# Patient Record
Sex: Female | Born: 1985 | Race: White | Hispanic: No | Marital: Married | Smoking: Never smoker
Health system: Southern US, Community
[De-identification: ages and names within clinical notes are randomized; demographics above are authoritative.]

## PROBLEM LIST (undated history)

## (undated) ENCOUNTER — Inpatient Hospital Stay (HOSPITAL_COMMUNITY): Payer: Self-pay

## (undated) DIAGNOSIS — M26629 Arthralgia of temporomandibular joint, unspecified side: Secondary | ICD-10-CM

## (undated) DIAGNOSIS — G4489 Other headache syndrome: Secondary | ICD-10-CM

## (undated) DIAGNOSIS — N920 Excessive and frequent menstruation with regular cycle: Secondary | ICD-10-CM

## (undated) DIAGNOSIS — E538 Deficiency of other specified B group vitamins: Secondary | ICD-10-CM

## (undated) DIAGNOSIS — M549 Dorsalgia, unspecified: Secondary | ICD-10-CM

## (undated) DIAGNOSIS — R5383 Other fatigue: Secondary | ICD-10-CM

## (undated) DIAGNOSIS — R002 Palpitations: Secondary | ICD-10-CM

## (undated) DIAGNOSIS — Z87442 Personal history of urinary calculi: Secondary | ICD-10-CM

## (undated) DIAGNOSIS — D497 Neoplasm of unspecified behavior of endocrine glands and other parts of nervous system: Secondary | ICD-10-CM

## (undated) DIAGNOSIS — R0602 Shortness of breath: Secondary | ICD-10-CM

## (undated) DIAGNOSIS — F53 Postpartum depression: Secondary | ICD-10-CM

## (undated) DIAGNOSIS — F411 Generalized anxiety disorder: Secondary | ICD-10-CM

## (undated) DIAGNOSIS — K8020 Calculus of gallbladder without cholecystitis without obstruction: Secondary | ICD-10-CM

## (undated) DIAGNOSIS — E049 Nontoxic goiter, unspecified: Secondary | ICD-10-CM

## (undated) DIAGNOSIS — G4440 Drug-induced headache, not elsewhere classified, not intractable: Secondary | ICD-10-CM

## (undated) DIAGNOSIS — R072 Precordial pain: Secondary | ICD-10-CM

## (undated) DIAGNOSIS — R079 Chest pain, unspecified: Secondary | ICD-10-CM

## (undated) DIAGNOSIS — F419 Anxiety disorder, unspecified: Secondary | ICD-10-CM

## (undated) DIAGNOSIS — F329 Major depressive disorder, single episode, unspecified: Secondary | ICD-10-CM

## (undated) DIAGNOSIS — I493 Ventricular premature depolarization: Secondary | ICD-10-CM

## (undated) DIAGNOSIS — N938 Other specified abnormal uterine and vaginal bleeding: Secondary | ICD-10-CM

## (undated) DIAGNOSIS — R7303 Prediabetes: Secondary | ICD-10-CM

## (undated) DIAGNOSIS — T7840XA Allergy, unspecified, initial encounter: Secondary | ICD-10-CM

## (undated) DIAGNOSIS — N200 Calculus of kidney: Secondary | ICD-10-CM

## (undated) DIAGNOSIS — F32A Depression, unspecified: Secondary | ICD-10-CM

## (undated) DIAGNOSIS — E559 Vitamin D deficiency, unspecified: Secondary | ICD-10-CM

## (undated) DIAGNOSIS — K219 Gastro-esophageal reflux disease without esophagitis: Secondary | ICD-10-CM

## (undated) DIAGNOSIS — O021 Missed abortion: Secondary | ICD-10-CM

## (undated) DIAGNOSIS — M7751 Other enthesopathy of right foot: Secondary | ICD-10-CM

## (undated) HISTORY — DX: Chest pain, unspecified: R07.9

## (undated) HISTORY — PX: WISDOM TOOTH EXTRACTION: SHX21

## (undated) HISTORY — DX: Allergy, unspecified, initial encounter: T78.40XA

## (undated) HISTORY — DX: Calculus of gallbladder without cholecystitis without obstruction: K80.20

## (undated) HISTORY — PX: KIDNEY STONE SURGERY: SHX686

## (undated) HISTORY — PX: MOUTH SURGERY: SHX715

## (undated) HISTORY — DX: Anxiety disorder, unspecified: F41.9

## (undated) HISTORY — DX: Major depressive disorder, single episode, unspecified: F32.9

## (undated) HISTORY — DX: Ventricular premature depolarization: I49.3

## (undated) HISTORY — PX: TYMPANOSTOMY TUBE PLACEMENT: SHX32

## (undated) HISTORY — DX: Other fatigue: R53.83

## (undated) HISTORY — DX: Shortness of breath: R06.02

## (undated) HISTORY — DX: Depression, unspecified: F32.A

## (undated) HISTORY — DX: Personal history of urinary calculi: Z87.442

## (undated) HISTORY — DX: Prediabetes: R73.03

## (undated) HISTORY — DX: Dorsalgia, unspecified: M54.9

---
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
## 2006-03-15 ENCOUNTER — Ambulatory Visit: Payer: Self-pay | Admitting: Family Medicine

## 2006-10-11 ENCOUNTER — Ambulatory Visit: Payer: Self-pay | Admitting: Family Medicine

## 2006-10-21 ENCOUNTER — Ambulatory Visit: Payer: Self-pay | Admitting: Family Medicine

## 2006-10-22 ENCOUNTER — Encounter: Payer: Self-pay | Admitting: Family Medicine

## 2006-10-25 ENCOUNTER — Ambulatory Visit: Payer: Self-pay | Admitting: Family Medicine

## 2007-03-17 ENCOUNTER — Ambulatory Visit: Payer: Self-pay | Admitting: Family Medicine

## 2007-03-17 LAB — CONVERTED CEMR LAB
Albumin: 4.5 g/dL (ref 3.5–5.2)
Alkaline Phosphatase: 50 units/L (ref 39–117)
BUN: 9 mg/dL (ref 6–23)
Basophils Absolute: 0 10*3/uL (ref 0.0–0.1)
Creatinine, Ser: 1 mg/dL (ref 0.4–1.2)
Eosinophils Absolute: 0.1 10*3/uL (ref 0.0–0.6)
GFR calc Af Amer: 91 mL/min
GFR calc non Af Amer: 75 mL/min
Hemoglobin: 13.3 g/dL (ref 12.0–15.0)
MCHC: 34.2 g/dL (ref 30.0–36.0)
MCV: 91.8 fL (ref 78.0–100.0)
Monocytes Absolute: 0.4 10*3/uL (ref 0.2–0.7)
Monocytes Relative: 7.1 % (ref 3.0–11.0)
Neutro Abs: 3.6 10*3/uL (ref 1.4–7.7)
Neutrophils Relative %: 64.1 % (ref 43.0–77.0)
Potassium: 4 meq/L (ref 3.5–5.1)
RDW: 11.8 % (ref 11.5–14.6)
Sodium: 140 meq/L (ref 135–145)
TSH: 2.13 microintl units/mL (ref 0.35–5.50)

## 2007-03-30 ENCOUNTER — Inpatient Hospital Stay (HOSPITAL_COMMUNITY): Admission: EM | Admit: 2007-03-30 | Discharge: 2007-04-02 | Payer: Self-pay | Admitting: Urology

## 2007-04-14 ENCOUNTER — Ambulatory Visit: Payer: Self-pay | Admitting: Family Medicine

## 2007-04-14 DIAGNOSIS — F411 Generalized anxiety disorder: Secondary | ICD-10-CM | POA: Insufficient documentation

## 2007-05-17 ENCOUNTER — Ambulatory Visit: Payer: Self-pay | Admitting: Family Medicine

## 2007-07-15 ENCOUNTER — Ambulatory Visit: Payer: Self-pay | Admitting: Internal Medicine

## 2007-07-15 LAB — CONVERTED CEMR LAB: Rapid Strep: NEGATIVE

## 2007-09-12 ENCOUNTER — Encounter: Payer: Self-pay | Admitting: Family Medicine

## 2007-12-13 ENCOUNTER — Telehealth (INDEPENDENT_AMBULATORY_CARE_PROVIDER_SITE_OTHER): Payer: Self-pay | Admitting: *Deleted

## 2007-12-16 ENCOUNTER — Ambulatory Visit: Payer: Self-pay | Admitting: Family Medicine

## 2007-12-16 DIAGNOSIS — Z87442 Personal history of urinary calculi: Secondary | ICD-10-CM | POA: Insufficient documentation

## 2007-12-16 HISTORY — DX: Personal history of urinary calculi: Z87.442

## 2007-12-16 LAB — CONVERTED CEMR LAB
ALT: 13 units/L (ref 0–35)
AST: 18 units/L (ref 0–37)
Basophils Absolute: 0 10*3/uL (ref 0.0–0.1)
Basophils Relative: 0 % (ref 0–1)
Bilirubin, Direct: 0.1 mg/dL (ref 0.0–0.3)
Blood in Urine, dipstick: NEGATIVE
CO2: 23 meq/L (ref 19–32)
Chloride: 105 meq/L (ref 96–112)
Glucose, Urine, Semiquant: NEGATIVE
Hemoglobin: 14.2 g/dL (ref 12.0–15.0)
Indirect Bilirubin: 0.4 mg/dL (ref 0.0–0.9)
Lymphocytes Relative: 28 % (ref 12–46)
MCHC: 33.9 g/dL (ref 30.0–36.0)
Monocytes Absolute: 0.4 10*3/uL (ref 0.1–1.0)
Monocytes Relative: 8 % (ref 3–12)
Neutro Abs: 3.7 10*3/uL (ref 1.7–7.7)
Neutrophils Relative %: 64 % (ref 43–77)
Nitrite: NEGATIVE
Potassium: 4 meq/L (ref 3.5–5.3)
RBC: 4.65 M/uL (ref 3.87–5.11)
RDW: 12.5 % (ref 11.5–15.5)
Specific Gravity, Urine: >=1.03
Total Protein: 7.7 g/dL (ref 6.0–8.3)
pH: 7

## 2007-12-22 ENCOUNTER — Ambulatory Visit: Payer: Self-pay | Admitting: Family Medicine

## 2008-03-01 ENCOUNTER — Emergency Department (HOSPITAL_COMMUNITY): Admission: EM | Admit: 2008-03-01 | Discharge: 2008-03-01 | Payer: Self-pay | Admitting: Emergency Medicine

## 2008-04-19 ENCOUNTER — Telehealth: Payer: Self-pay | Admitting: Family Medicine

## 2008-09-06 ENCOUNTER — Ambulatory Visit: Payer: Self-pay | Admitting: Family Medicine

## 2008-09-06 DIAGNOSIS — F329 Major depressive disorder, single episode, unspecified: Secondary | ICD-10-CM

## 2008-09-06 DIAGNOSIS — F32A Depression, unspecified: Secondary | ICD-10-CM | POA: Insufficient documentation

## 2008-12-03 ENCOUNTER — Telehealth: Payer: Self-pay | Admitting: Family Medicine

## 2008-12-10 ENCOUNTER — Telehealth: Payer: Self-pay | Admitting: Family Medicine

## 2009-01-10 ENCOUNTER — Inpatient Hospital Stay (HOSPITAL_COMMUNITY): Admission: EM | Admit: 2009-01-10 | Discharge: 2009-01-13 | Payer: Self-pay | Admitting: Emergency Medicine

## 2009-01-10 ENCOUNTER — Ambulatory Visit: Payer: Self-pay | Admitting: Internal Medicine

## 2009-01-24 ENCOUNTER — Ambulatory Visit: Payer: Self-pay | Admitting: Family Medicine

## 2009-02-06 ENCOUNTER — Ambulatory Visit: Payer: Self-pay | Admitting: Family Medicine

## 2010-01-09 ENCOUNTER — Ambulatory Visit: Payer: Self-pay | Admitting: Family Medicine

## 2010-03-31 ENCOUNTER — Ambulatory Visit: Payer: Self-pay | Admitting: Family Medicine

## 2010-04-02 ENCOUNTER — Emergency Department (HOSPITAL_COMMUNITY): Admission: EM | Admit: 2010-04-02 | Discharge: 2010-04-03 | Payer: Self-pay | Admitting: Emergency Medicine

## 2010-04-03 ENCOUNTER — Emergency Department (HOSPITAL_COMMUNITY): Admission: EM | Admit: 2010-04-03 | Discharge: 2010-04-04 | Payer: Self-pay | Admitting: Emergency Medicine

## 2010-04-10 ENCOUNTER — Ambulatory Visit: Payer: Self-pay | Admitting: Family Medicine

## 2010-06-12 ENCOUNTER — Emergency Department (HOSPITAL_COMMUNITY): Admission: EM | Admit: 2010-06-12 | Discharge: 2010-06-12 | Payer: Self-pay | Admitting: Emergency Medicine

## 2010-09-11 ENCOUNTER — Ambulatory Visit
Admission: RE | Admit: 2010-09-11 | Discharge: 2010-09-11 | Payer: Self-pay | Source: Home / Self Care | Attending: Family Medicine | Admitting: Family Medicine

## 2010-09-30 NOTE — Assessment & Plan Note (Signed)
Summary: TB,VAR TITER//ALP  Nurse Visit   Allergies: 1)  ! Penicillin  Immunizations Administered:  PPD Skin Test:    Vaccine Type: PPD    Site: left forearm    Mfr: Sanofi Pasteur    Dose: 0.1 ml    Route: ID    Given by: Kern Reap CMA (AAMA)    Exp. Date: 06/13/2011    Lot #: [id number]    Physician counseled: yes  PPD Results    Date of reading: 01/11/2010    Results: < 5mm    Interpretation: negative  Orders Added: 1)  Venipuncture [36415] 2)  T- * Misc. Laboratory test [99999] 3)  TB Skin Test [86580] 4)  Admin 1st Vaccine [phone]

## 2010-09-30 NOTE — Letter (Signed)
Summary: Out of Work  Adult nurse at [employer]  [address]   [place], [place] [address]   Phone: [phone]  Fax: [phone]    March 31, 2010   Employee:  Brandy [name]    To Whom It May Concern:   For Medical reasons, please excuse the above named employee from work for the following dates:  Start:   March 31, 2010  End:   April 07, 2010  If you need additional information, please feel free to contact our office.         Sincerely,    Kelle Darting, MD

## 2010-09-30 NOTE — Assessment & Plan Note (Signed)
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  About a week ago.  She had a lump in her buttocks.  She think she may have a pilonidal cyst.  She also is feeling depressed.  She stopped her Prozac about 3 months ago because of weight gain.  In the, past.  She's also tried Celexa, however, it did not work for her.  She is not taking the Ativan or the stool softener, down with a normal.  Periods are normal.  Not sexually active now,,,,,,,,,,, recently broke up with her boyfriend.  They were using condoms for birth control....... she needs a Pap  Allergies: 1)  ! Penicillin  Social History: Reviewed history from 03/29/2007 and no changes required. Never Smoked Alcohol use-no Drug use-no Regular exercise-yes  Review of Systems      See HPI  Physical Exam  General:  Well-developed,well-nourished,in no acute distress; alert,appropriate and cooperative throughout examination Skin:  there is a pilonidal cyst.  Red tender, not fluctuant.  Her pain on a scale of one to 10 as a 7 she took a Vicodin last night, and it didn't help Psych:  Oriented X3, depressed affect, flat affect, and tearful.     Problems:  Medical Problems Added: 1)  Dx of Abscess-ischiorectal  (ICD-566)  Impression & Recommendations:  Problem # 1:  ABSCESS-ISCHIORECTAL (ICD-566) Assessment New  Orders: Prescription Created Electronically [phone])  Problem # 2:  DEPRESSION (ICD-311) Assessment:  Deteriorated  The following medications were removed from the medication list:    Prozac 40 Mg Caps (Fluoxetine hcl) .Marland Kitchen... 1 tab @ bedtime Her updated medication list for this problem includes:    Ativan 0.5 Mg Tabs (Lorazepam) .Marland Kitchen... Take 1 tablet by mouth once a day as needed    Effexor Xr 37.5 Mg Xr24h-cap (Venlafaxine hcl) .Marland Kitchen... Take 1 tablet by mouth every morning  Orders: Prescription Created Electronically [phone])  Complete Medication List: 1)  Ativan 0.5 Mg Tabs (Lorazepam) .... Take 1 tablet by mouth once a day as needed 2)  Keflex 500 Mg Caps (Cephalexin) .... 2 by mouth two times a day 3)  Effexor Xr 37.5 Mg Xr24h-cap (Venlafaxine hcl) .... Take 1 tablet by mouth every morning 4)  Meperidine Hcl 50 Mg/ml Soln (Meperidine hcl) .... Take 1 tablet by mouth four times a day as needed pain  Patient Instructions: 1)  began... Keflex, 500 mg dose two tabs b.i.d., warm soaks, 30 minutes 4 times a day, Demerol, 50 mg every 4 to 6 hours as needed for pain.  Return Thursday afternoon for follow-up. 2)  .  Effexor 37.5 mg q.a.m. Prescriptions: MEPERIDINE HCL 50 MG/ML SOLN (MEPERIDINE HCL) Take 1 tablet by mouth four times a day as needed pain  #30 x 0   Entered and Authorized by:   Roderick Pee MD   Signed by:   Eugenio Hoes  Todd MD on 03/31/2010   Method used:   Print then Give to Patient   RxID:   [id number] EFFEXOR XR 37.5 MG XR24H-CAP (VENLAFAXINE HCL) Take 1 tablet by mouth every morning  #30 x 1   Entered and Authorized by:   Roderick Pee MD   Signed by:   Roderick Pee MD on 03/31/2010   Method used:   Electronically to        [facility]* (retail)       [address] [place], [place]  [address]       Ph: [phone] or [phone]       Fax: [phone]   RxID:   [id number] KEFLEX 500 MG CAPS (CEPHALEXIN) 2 by mouth two times a day  #40 x 1   Entered and Authorized by:   Roderick Pee MD   Signed  by:   Roderick Pee MD on 03/31/2010   Method used:   Electronically to        [facility]* (retail)       [address] [place], [place]  [address]       Ph: [phone] or [phone]       Fax: [phone]   RxID:   [id number]

## 2010-09-30 NOTE — Assessment & Plan Note (Signed)
Summary: ROA/FUP/RCD MOM RSC/NJR/mom rescd//ccm   Vital Signs:  Patient profile:   25 year old female Menstrual status:  regular Weight:      157 pounds Temp:     98.6 degrees F oral BP sitting:   90 / 60  (left arm) Cuff size:   regular  Vitals Entered By: Kathrynn Speed CMA (April 10, 2010 3:28 PM) CC: roa, fup after sugery,src   CC:  roa, fup after sugery, and src.  History of Present Illness: Brandy [name] is a 25 year old female, who comes in today for evaluation of two problems.  She had a now cystic infected subsequently had to go the emergency room to get it drained.  It's mostly resolved.  Also, she has a history of panic attacks, currently on Prozac 40 mg daily would like to stay with that.  Also recommended Ativan p.r.n. if she has breakthrough attack.  Back in August, the first, we gave her the Effexor, however, she wants to stay with the Prozac  Current Medications (verified): 1)  Ativan 0.5 Mg  Tabs (Lorazepam) .... Take 1 Tablet By Mouth Once A Day As Needed 2)  Effexor Xr 37.5 Mg Xr24h-Cap (Venlafaxine Hcl) .... Take 1 Tablet By Mouth Every Morning  Allergies (verified): 1)  ! Penicillin  Review of Systems      See HPI  Physical Exam  General:  Well-developed,well-nourished,in no acute distress; alert,appropriate and cooperative throughout examination Skin:  resolving pilonidal abscess Psych:  Cognition and judgment appear intact. Alert and cooperative with normal attention span and concentration. No apparent delusions, illusions, hallucinations   Impression & Recommendations:  Problem # 1:  ABSCESS-ISCHIORECTAL (ICD-566) Assessment Improved  Problem # 2:  DEPRESSION (ICD-311) Assessment: Improved  Her updated medication list for this problem includes:    Ativan 0.5 Mg Tabs (Lorazepam) .Marland Kitchen... Take 1 tablet by mouth once a day as needed    Effexor Xr 37.5 Mg Xr24h-cap (Venlafaxine hcl) .Marland Kitchen... Take 1 tablet by mouth every morning    Prozac 40 Mg Caps  (Fluoxetine hcl) .Marland Kitchen... 1 tab @ bedtime    Ativan 0.5 Mg Tabs (Lorazepam) .Marland Kitchen... Take 1 tablet by mouth two times a day as needed  Complete Medication List: 1)  Ativan 0.5 Mg Tabs (Lorazepam) .... Take 1 tablet by mouth once a day as needed 2)  Effexor Xr 37.5 Mg Xr24h-cap (Venlafaxine hcl) .... Take 1 tablet by mouth every morning 3)  Prozac 40 Mg Caps (Fluoxetine hcl) .Marland Kitchen.. 1 tab @ bedtime 4)  Ativan 0.5 Mg Tabs (Lorazepam) .... Take 1 tablet by mouth two times a day as needed  Patient Instructions: 1)  continue the Prozac 40 mg daily and also taken Ativan .5 up to twice daily p.r.n. 2)  If in two weeks, you feel we need to try the Effexor, call, and I will discuss it with you by phone Prescriptions: ATIVAN 0.5 MG TABS (LORAZEPAM) Take 1 tablet by mouth two times a day as needed  #30 x 2   Entered and Authorized by:   Roderick Pee MD   Signed by:   Roderick Pee MD on 04/10/2010   Method used:   Print then Give to Patient   RxID:   [id number] PROZAC 40 MG CAPS (FLUOXETINE HCL) 1 tab @ bedtime  #100 x 3   Entered and Authorized by:   Roderick Pee MD   Signed by:   Roderick Pee MD on 04/10/2010   Method used:   Print then Give  to Patient   RxID:   [id number]

## 2010-10-02 NOTE — Assessment & Plan Note (Signed)
Summary: ?PINK EYE/CJR   Vital Signs:  Patient profile:   25 year old female Menstrual status:  regular Weight:      176 pounds Temp:     98.6 degrees F oral BP sitting:   110 / 80  (left arm) Cuff size:   regular  Vitals Entered By: Romualdo Bolk, CMA (AAMA) (September 11, 2010 3:37 PM) CC: Left eye pain this am, no fever, nausea, chills and some coughing and congestion   CC:  Left eye pain this am, no fever, nausea, and chills and some coughing and congestion.  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  She is been around sick relatives.  Current Medications (verified): 1)  Prozac 40 Mg Caps (Fluoxetine Hcl) .Marland Kitchen.. 1 Tab @ Bedtime 2)  Ativan 0.5 Mg Tabs (Lorazepam) .... Take 1 Tablet By Mouth Two Times A Day As Needed  Allergies (verified): 1)  ! Penicillin  Past History:  Past medical, surgical, family and social histories (including risk factors) reviewed for relevance to current acute and chronic problems.  Past Medical History: Reviewed history from 09/06/2008 and no changes required. Migraine Headache Anxiety Nephrolithiasis, hx of Depression  Past Surgical History: Reviewed history from 03/29/2007 and no changes required. Kidney Stone x 3  Family History: Reviewed history from 03/29/2007 and no changes required. Family History of Arthritis Family History Diabetes 1st degree relative Family History Hypertension Family History Lung cancer Family History Other cancer-Colon, Breast Family History of Cardiovascular disorder  Social History: Reviewed history from 03/29/2007 and no changes required. Never Smoked Alcohol use-no Drug use-no Regular exercise-yes  Review of Systems      See HPI  Physical Exam  General:  Well-developed,well-nourished,in no acute distress; alert,appropriate and cooperative  throughout examination Head:  Normocephalic and atraumatic without obvious abnormalities. No apparent alopecia or balding. Eyes:  left eye red however, it does not go over the cornea Ears:  External ear exam shows no significant lesions or deformities.  Otoscopic examination reveals clear canals, tympanic membranes are intact bilaterally without bulging, retraction, inflammation or discharge. Hearing is grossly normal bilaterally. Nose:  External nasal examination shows no deformity or inflammation. Nasal mucosa are pink and moist without lesions or exudates. Mouth:  Oral mucosa and oropharynx without lesions or exudates.  Teeth in good repair. Neck:  No deformities, masses, or tenderness noted. Chest Wall:  No deformities, masses, or tenderness noted. Lungs:  Normal respiratory effort, chest expands symmetrically. Lungs are clear to auscultation, no crackles or wheezes.   Problems:  Medical Problems Added: 1)  Dx of Viral Infection-unspec  (ICD-079.99) 2)  Dx of Conjunctivitis  (ICD-372.30)  Impression & Recommendations:  Problem # 1:  VIRAL INFECTION-UNSPEC (ICD-079.99) Assessment New  Her updated medication list for this problem includes:    Hydromet 5-1.5 Mg/17ml Syrp (Hydrocodone-homatropine) .Marland Kitchen... 1/2 to 1 tsp qhs as needed  Problem # 2:  CONJUNCTIVITIS (ICD-372.30) Assessment: New  Her updated medication list for this problem includes:    Genoptic 0.3 % Soln (Gentamicin sulfate) .Marland Kitchen... 1 gtt r & l eye two times a day until clear  Complete Medication List: 1)  Prozac 40 Mg Caps (Fluoxetine hcl) .Marland Kitchen.. 1 tab @ bedtime 2)  Ativan 0.5 Mg Tabs (Lorazepam) .... Take 1 tablet by mouth two times a day as needed 3)  Genoptic 0.3 % Soln (Gentamicin sulfate) .Marland Kitchen.. 1 gtt  r & l eye two times a day until clear 4)  Hydromet 5-1.5 Mg/19ml Syrp (Hydrocodone-homatropine) .... 1/2 to 1 tsp qhs as needed  Patient Instructions: 1)  use one drop of the medicated eye drops in each eye twice daily to  the redness is gone. 2)  Hydromet one half to 1 teaspoon nightly p.r.n. cough.  Return p.r.n. Prescriptions: HYDROMET 5-1.5 MG/5ML SYRP (HYDROCODONE-HOMATROPINE) 1/2 to 1 tsp qhs as needed  #4oz x 1   Entered and Authorized by:   Roderick Pee MD   Signed by:   Roderick Pee MD on 09/11/2010   Method used:   Print then Give to Patient   RxID:   [id number] GENOPTIC 0.3 % SOLN (GENTAMICIN SULFATE) 1 gtt r & L eye two times a day until clear  #10 cc x 1   Entered and Authorized by:   Roderick Pee MD   Signed by:   Roderick Pee MD on 09/11/2010   Method used:   Electronically to        [facility] (retail)       [address]       [place], [place]  [address]       Ph: [phone]       Fax: [phone]   RxID:   [id number]    Orders Added: 1)  Est. Patient Level III [42595]

## 2010-10-07 ENCOUNTER — Encounter: Payer: Self-pay | Admitting: Family Medicine

## 2010-11-12 ENCOUNTER — Encounter: Payer: Self-pay | Admitting: Family Medicine

## 2010-11-12 LAB — URINALYSIS, ROUTINE W REFLEX MICROSCOPIC
Bilirubin Urine: NEGATIVE
Hgb urine dipstick: NEGATIVE
Ketones, ur: NEGATIVE mg/dL
Specific Gravity, Urine: 1.022 (ref 1.005–1.030)
Urobilinogen, UA: 0.2 mg/dL (ref 0.0–1.0)

## 2010-11-12 LAB — BASIC METABOLIC PANEL
BUN: 10 mg/dL (ref 6–23)
CO2: 31 mEq/L (ref 19–32)
Chloride: 105 mEq/L (ref 96–112)
Glucose, Bld: 88 mg/dL (ref 70–99)
Potassium: 3.5 mEq/L (ref 3.5–5.1)
Sodium: 140 mEq/L (ref 135–145)

## 2010-11-12 LAB — CBC
HCT: 42.1 % (ref 36.0–46.0)
Hemoglobin: 14.4 g/dL (ref 12.0–15.0)
MCH: 31.6 pg (ref 26.0–34.0)
MCHC: 34.3 g/dL (ref 30.0–36.0)
MCV: 92.2 fL (ref 78.0–100.0)

## 2010-11-12 LAB — DIFFERENTIAL
Basophils Relative: 1 % (ref 0–1)
Eosinophils Absolute: 0 10*3/uL (ref 0.0–0.7)
Eosinophils Relative: 1 % (ref 0–5)
Monocytes Absolute: 0.5 10*3/uL (ref 0.1–1.0)
Monocytes Relative: 7 % (ref 3–12)
Neutro Abs: 4.8 10*3/uL (ref 1.7–7.7)

## 2010-11-13 ENCOUNTER — Encounter: Payer: Self-pay | Admitting: Family Medicine

## 2010-11-13 ENCOUNTER — Ambulatory Visit (INDEPENDENT_AMBULATORY_CARE_PROVIDER_SITE_OTHER): Payer: PRIVATE HEALTH INSURANCE | Admitting: Family Medicine

## 2010-11-13 VITALS — BP 124/80 | Temp 98.7°F | Ht 68.5 in | Wt 124.0 lb

## 2010-11-13 DIAGNOSIS — F411 Generalized anxiety disorder: Secondary | ICD-10-CM

## 2010-11-13 LAB — POCT URINALYSIS DIPSTICK
Ketones, UA: NEGATIVE
Protein, UA: NEGATIVE
Spec Grav, UA: 1.02
pH, UA: 6.5

## 2010-11-13 LAB — CBC WITH DIFFERENTIAL/PLATELET
Basophils Absolute: 0 10*3/uL (ref 0.0–0.1)
Eosinophils Absolute: 0 10*3/uL (ref 0.0–0.7)
HCT: 39.8 % (ref 36.0–46.0)
Hemoglobin: 13.7 g/dL (ref 12.0–15.0)
Lymphs Abs: 1.4 10*3/uL (ref 0.7–4.0)
MCHC: 34.3 g/dL (ref 30.0–36.0)
Neutro Abs: 4.4 10*3/uL (ref 1.4–7.7)
RDW: 12.9 % (ref 11.5–14.6)

## 2010-11-13 LAB — LIPID PANEL
Cholesterol: 167 mg/dL (ref 0–200)
HDL: 50.6 mg/dL (ref >39.00)
Triglycerides: 56 mg/dL (ref 0.0–149.0)
VLDL: 11.2 mg/dL (ref 0.0–40.0)

## 2010-11-13 LAB — BASIC METABOLIC PANEL
CO2: 28 mEq/L (ref 19–32)
Calcium: 9.5 mg/dL (ref 8.4–10.5)
Glucose, Bld: 80 mg/dL (ref 70–99)
Potassium: 4.1 mEq/L (ref 3.5–5.1)
Sodium: 142 mEq/L (ref 135–145)

## 2010-11-13 LAB — T3, FREE: T3, Free: 3.2 pg/mL (ref 2.3–4.2)

## 2010-11-13 LAB — HEPATIC FUNCTION PANEL: Albumin: 4.6 g/dL (ref 3.5–5.2)

## 2010-11-13 MED ORDER — LORAZEPAM 0.5 MG PO TABS: 0.5000 mg | ORAL_TABLET | Freq: Two times a day (BID) | ORAL | Status: DC | PRN

## 2010-11-13 MED ORDER — BUPROPION HCL ER (SR) 100 MG PO TB12: ORAL_TABLET | ORAL | Status: DC

## 2010-11-13 MED ORDER — NADOLOL 20 MG PO TABS: ORAL_TABLET | ORAL | Status: DC

## 2010-11-13 NOTE — Patient Instructions (Signed)
Begin Corgard 20 mg at bedtime, and Wellbutrin 100 mg q.a.m. Continue the Ativan at bedtime p.r.n.  Labs today.  30 minute appointment in two weeks for follow-up and general physical

## 2010-11-13 NOTE — Progress Notes (Signed)
  Subjective:    Patient ID: Brandy [name], female    DOB: 1986/04/06, 25 y.o.   MRN: [id number]  Brandy [name] is a 25 year old single female, nonsmoker, who comes in today for evaluation of anxiety.  She stopped her Prozac about 6 months ago because she was concerned about weight gain.  Now she is having symptoms of rapid heart rate chest pain, anxiety.  Review of systems otherwise negative.  LMP stopped two days ago    Review of Systems    Negative Objective:   Physical Exam    Well-developed well-nourished, female, in no acute distress.  Lungs are clear to auscultation.  Cardiac exam negative.  No murmur no click    Assessment & Plan:  Anxiety.........Marland Kitchen Labs today, start Wellbutrin, and Corgard.  Follow-up in two weeks 30 minute appointment

## 2010-11-14 LAB — BASIC METABOLIC PANEL
CO2: 23 mEq/L (ref 19–32)
Calcium: 9.3 mg/dL (ref 8.4–10.5)
Creatinine, Ser: 0.75 mg/dL (ref 0.4–1.2)
GFR calc Af Amer: >60 mL/min (ref >60)
GFR calc non Af Amer: >60 mL/min (ref >60)
Glucose, Bld: 98 mg/dL (ref 70–99)
Sodium: 136 mEq/L (ref 135–145)

## 2010-11-14 LAB — DIFFERENTIAL
Eosinophils Relative: 0 % (ref 0–5)
Lymphocytes Relative: 5 % — ABNORMAL LOW (ref 12–46)
Lymphs Abs: 0.7 10*3/uL (ref 0.7–4.0)

## 2010-11-14 LAB — CULTURE, ROUTINE-ABSCESS: Culture: NO GROWTH

## 2010-11-14 LAB — POCT I-STAT, CHEM 8
Chloride: 104 mEq/L (ref 96–112)
HCT: 46 % (ref 36.0–46.0)
Hemoglobin: 15.6 g/dL — ABNORMAL HIGH (ref 12.0–15.0)
Potassium: 3.8 mEq/L (ref 3.5–5.1)
Sodium: 138 mEq/L (ref 135–145)

## 2010-11-14 LAB — URINALYSIS, ROUTINE W REFLEX MICROSCOPIC
Bilirubin Urine: NEGATIVE
Bilirubin Urine: NEGATIVE
Glucose, UA: NEGATIVE mg/dL
Hgb urine dipstick: NEGATIVE
Nitrite: NEGATIVE
Protein, ur: NEGATIVE mg/dL
Specific Gravity, Urine: 1.016 (ref 1.005–1.030)
Urobilinogen, UA: 1 mg/dL (ref 0.0–1.0)
Urobilinogen, UA: 1 mg/dL (ref 0.0–1.0)
pH: 7 (ref 5.0–8.0)

## 2010-11-14 LAB — CBC
HCT: 42.9 % (ref 36.0–46.0)
MCH: 30.4 pg (ref 26.0–34.0)
MCHC: 33.6 g/dL (ref 30.0–36.0)
MCV: 90.3 fL (ref 78.0–100.0)
Platelets: 267 10*3/uL (ref 150–400)
Platelets: 268 10*3/uL (ref 150–400)
RBC: 4.75 MIL/uL (ref 3.87–5.11)
WBC: 13.9 10*3/uL — ABNORMAL HIGH (ref 4.0–10.5)

## 2010-11-14 LAB — URINE CULTURE
Colony Count: NO GROWTH
Culture: NO GROWTH
Culture: NO GROWTH

## 2010-11-14 LAB — POCT PREGNANCY, URINE: Preg Test, Ur: NEGATIVE

## 2010-11-14 LAB — URINE MICROSCOPIC-ADD ON

## 2010-11-26 ENCOUNTER — Ambulatory Visit (INDEPENDENT_AMBULATORY_CARE_PROVIDER_SITE_OTHER): Payer: PRIVATE HEALTH INSURANCE | Admitting: Family Medicine

## 2010-11-26 ENCOUNTER — Other Ambulatory Visit (HOSPITAL_COMMUNITY)
Admission: RE | Admit: 2010-11-26 | Discharge: 2010-11-26 | Disposition: A | Discharge status: Home / Self Care | Payer: PRIVATE HEALTH INSURANCE | Source: Ambulatory Visit | Attending: Family Medicine | Admitting: Family Medicine

## 2010-11-26 ENCOUNTER — Encounter: Payer: Self-pay | Admitting: Family Medicine

## 2010-11-26 VITALS — BP 110/78 | Temp 99.0°F | Ht 68.0 in | Wt 174.0 lb

## 2010-11-26 DIAGNOSIS — Z0000 Encounter for general adult medical examination without abnormal findings: Secondary | ICD-10-CM

## 2010-11-26 DIAGNOSIS — F411 Generalized anxiety disorder: Secondary | ICD-10-CM

## 2010-11-26 DIAGNOSIS — Z01419 Encounter for gynecological examination (general) (routine) without abnormal findings: Secondary | ICD-10-CM | POA: Insufficient documentation

## 2010-11-26 MED ORDER — BUPROPION HCL ER (SR) 200 MG PO TB12: ORAL_TABLET | ORAL | Status: DC

## 2010-11-26 NOTE — Progress Notes (Signed)
  Subjective:    Patient ID: Brandy [name], female    DOB: May 17, 1986, 25 y.o.   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  We started her on Wellbutrin 100 mg daily, and this is markedly decreased.  Her symptoms of anxiety.  In the past two, weeks she's only had to take one Ativan.  She also stopped the Corgard.  Psychiatric he is systems otherwise negative.  LMP two weeks ago, normal.  Not sexually active never had a Pap tetanus 2003   Review of Systems  Constitutional: Negative.   HENT: Negative.   Eyes: Negative.   Respiratory: Negative.   Cardiovascular: Negative.   Gastrointestinal: Negative.   Genitourinary: Negative.   Musculoskeletal: Negative.   Neurological: Negative.   Hematological: Negative.   Psychiatric/Behavioral: Negative.  Negative for hallucinations, behavioral problems, confusion, dysphoric mood, decreased concentration and agitation. The patient is not nervous/anxious and is not hyperactive.        Objective:   Physical Exam  Constitutional: She appears well-developed and well-nourished.  HENT:  Head: Normocephalic and atraumatic.  Right Ear: External ear normal.  Left Ear: External ear normal.  Nose: Nose normal.  Mouth/Throat: Oropharynx is clear and moist.  Eyes: EOM are normal. Pupils are equal, round, and reactive to light.  Neck: Normal range of motion. Neck supple. No thyromegaly present.  Cardiovascular: Normal rate, regular rhythm, normal heart sounds and intact distal pulses.  Exam reveals no gallop and no friction rub.   No murmur heard. Pulmonary/Chest: Effort normal and breath sounds normal.  Abdominal: Soft. Bowel sounds are normal. She exhibits no distension and no mass. There is no tenderness. There is no rebound.  Genitourinary: Vagina normal and uterus normal. Guaiac negative stool.  No vaginal discharge found.       Breast exam normal  Musculoskeletal: Normal range of motion.  Lymphadenopathy:    She has no cervical adenopathy.  Neurological: She is alert. She has normal reflexes. No cranial nerve deficit. She exhibits normal muscle tone. Coordination normal.  Skin: Skin is warm and dry.  Psychiatric: She has a normal mood and affect. Her behavior is normal. Judgment and thought content normal.          Assessment & Plan:  Healthy female.  General anxiety disorder,,,,,,,,,,,, increase the Wellbutrin to 200 mg daily, Ativan p.r.n. Follow-up in one year or sooner if any problems

## 2010-11-26 NOTE — Patient Instructions (Signed)
Increase the Wellbutrin to 200 mg daily.  If you feel well, then will see you next March for y  annual exam or if you have any problems........  Check back with [place] sooner

## 2010-12-09 LAB — URINALYSIS, MICROSCOPIC ONLY
Bilirubin Urine: NEGATIVE
Bilirubin Urine: NEGATIVE
Glucose, UA: NEGATIVE mg/dL
Ketones, ur: NEGATIVE mg/dL
Leukocytes, UA: NEGATIVE
Nitrite: NEGATIVE
Nitrite: NEGATIVE
Nitrite: NEGATIVE
Specific Gravity, Urine: 1.007 (ref 1.005–1.030)
Specific Gravity, Urine: 1.012 (ref 1.005–1.030)
Specific Gravity, Urine: 1.019 (ref 1.005–1.030)
Urobilinogen, UA: 0.2 mg/dL (ref 0.0–1.0)
Urobilinogen, UA: 1 mg/dL (ref 0.0–1.0)
pH: 6 (ref 5.0–8.0)
pH: 7 (ref 5.0–8.0)

## 2010-12-09 LAB — URINALYSIS, ROUTINE W REFLEX MICROSCOPIC
Bilirubin Urine: NEGATIVE
Glucose, UA: NEGATIVE mg/dL
Hgb urine dipstick: NEGATIVE
Ketones, ur: NEGATIVE mg/dL
Protein, ur: NEGATIVE mg/dL
Urobilinogen, UA: 0.2 mg/dL (ref 0.0–1.0)

## 2010-12-09 LAB — BASIC METABOLIC PANEL
BUN: 1 mg/dL — ABNORMAL LOW (ref 6–23)
BUN: 6 mg/dL (ref 6–23)
Chloride: 110 mEq/L (ref 96–112)
Creatinine, Ser: 0.68 mg/dL (ref 0.4–1.2)
GFR calc non Af Amer: >60 mL/min (ref >60)
GFR calc non Af Amer: >60 mL/min (ref >60)
Potassium: 4.1 mEq/L (ref 3.5–5.1)
Sodium: 140 mEq/L (ref 135–145)

## 2010-12-09 LAB — CBC
HCT: 34.9 % — ABNORMAL LOW (ref 36.0–46.0)
HCT: 38.2 % (ref 36.0–46.0)
Hemoglobin: 11.8 g/dL — ABNORMAL LOW (ref 12.0–15.0)
MCHC: 34.7 g/dL (ref 30.0–36.0)
MCV: 91.8 fL (ref 78.0–100.0)
MCV: 93.1 fL (ref 78.0–100.0)
Platelets: 171 10*3/uL (ref 150–400)
Platelets: 220 10*3/uL (ref 150–400)
RBC: 3.75 MIL/uL — ABNORMAL LOW (ref 3.87–5.11)
RDW: 12.5 % (ref 11.5–15.5)
WBC: 5.8 10*3/uL (ref 4.0–10.5)
WBC: 6.5 10*3/uL (ref 4.0–10.5)
WBC: 7.2 10*3/uL (ref 4.0–10.5)

## 2010-12-09 LAB — VITAMIN D 1,25 DIHYDROXY
Vitamin D 1, 25 (OH)2 Total: 46 pg/mL (ref 18–72)
Vitamin D3 1, 25 (OH)2: 46 pg/mL

## 2010-12-09 LAB — COMPREHENSIVE METABOLIC PANEL
Albumin: 4.2 g/dL (ref 3.5–5.2)
BUN: 13 mg/dL (ref 6–23)
Calcium: 9.4 mg/dL (ref 8.4–10.5)
Chloride: 108 mEq/L (ref 96–112)
Creatinine, Ser: 0.79 mg/dL (ref 0.4–1.2)
Total Bilirubin: 0.4 mg/dL (ref 0.3–1.2)

## 2010-12-09 LAB — LIPASE, BLOOD: Lipase: 30 U/L (ref 11–59)

## 2010-12-09 LAB — HEMOGLOBIN A1C
Hgb A1c MFr Bld: 5.1 % (ref 4.6–6.1)
Mean Plasma Glucose: 100 mg/dL

## 2010-12-09 LAB — DIFFERENTIAL
Basophils Absolute: 0 10*3/uL (ref 0.0–0.1)
Lymphocytes Relative: 34 % (ref 12–46)
Lymphs Abs: 2.4 10*3/uL (ref 0.7–4.0)
Monocytes Absolute: 0.7 10*3/uL (ref 0.1–1.0)
Neutro Abs: 3.9 10*3/uL (ref 1.7–7.7)

## 2010-12-09 LAB — VITAMIN B12: Vitamin B-12: 384 pg/mL (ref 211–911)

## 2010-12-09 LAB — TSH: TSH: 1.049 u[IU]/mL (ref 0.350–4.500)

## 2010-12-15 ENCOUNTER — Encounter: Payer: Self-pay | Admitting: Family Medicine

## 2010-12-15 ENCOUNTER — Ambulatory Visit (INDEPENDENT_AMBULATORY_CARE_PROVIDER_SITE_OTHER): Payer: Self-pay | Admitting: Family Medicine

## 2010-12-15 DIAGNOSIS — M79609 Pain in unspecified limb: Secondary | ICD-10-CM

## 2010-12-15 DIAGNOSIS — M542 Cervicalgia: Secondary | ICD-10-CM

## 2010-12-15 DIAGNOSIS — M549 Dorsalgia, unspecified: Secondary | ICD-10-CM

## 2010-12-15 MED ORDER — HYDROCODONE-ACETAMINOPHEN 7.5-750 MG PO TABS: ORAL_TABLET | ORAL | Status: DC

## 2010-12-15 MED ORDER — CYCLOBENZAPRINE HCL 5 MG PO TABS: 5.0000 mg | ORAL_TABLET | Freq: Three times a day (TID) | ORAL | Status: DC | PRN

## 2010-12-15 NOTE — Patient Instructions (Signed)
Motrin 600 mg twice daily with food.  Flexeril and Vicodin one half to one tablet of each 3 times a day as needed for severe pain.  Call in a week or two if your symptoms persist to get set up for physical therapy
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
## 2011-01-13 NOTE — Consult Note (Signed)
Brandy [name], Brandy [name]             ACCOUNT NO.:  [id number]   MEDICAL RECORD NO.:  [id number]          PATIENT TYPE:  INP   LOCATION:  0101                         FACILITY:  [facility]   PHYSICIAN:  Maretta Bees. Vonita Moss, M.D.DATE OF BIRTH:  1986/02/27   DATE OF CONSULTATION:  01/10/2009  DATE OF DISCHARGE:                                 CONSULTATION   REASON FOR CONSULTATION:  I was asked to see this 25 year old lady by  the [facility].   HISTORY:  She has had a couple of weeks of right flank pain and also has  had nausea and vomiting.  She says she has an increased urge to void.  She says it feels like prior kidney stones.  She has had several stones  before and has undergone prior ureteroscopy.  Her stones are calcium  oxalate.  CT scan showed a punctate nonobstructing right renal stone  with no hydronephrosis.  She also had a right ovarian cyst and large  fecal burden in her colon, and question of some small bowel dysfunction.  She denies fever or gross hematuria.   PAST MEDICAL HISTORY:  Includes depression.   MEDICATIONS:  Prozac.   ALLERGIES:  PENICILLIN.   FAMILY HISTORY:  Positive for stones.   REVIEW OF SYSTEMS:  She denies cough, chest pain, shortness of breath,  headache or dizziness.   PHYSICAL EXAMINATION:  VITAL SIGNS:  Stable.  She is afebrile with blood  pressure 120/98, pulse is 90, respiratory rate 21.  GENERAL:  She is in acute distress complaining of pain.  Otherwise, she  is alert and oriented.  NECK:  Supple.  LUNGS:  No respiratory distress.  ABDOMEN:  Slight guarding in the right abdominal region.  No suprapubic  tenderness.   IMPRESSION:  Right flank pain and increased urge to void that seems  consistent with a stone.  However, CT scan does not confirm an ureteral  stone or hydronephrosis.  I could not rule out a nonvisualized and  nonobstructing stone.  At this point, I would agree with hospitalization  with IV fluids, IV narcotics,  strain her urine and put her on Flomax 0.4  mg a day.   We will follow this patient with you.      Maretta Bees. Vonita Moss, M.D.  Electronically Signed    LJP/MEDQ  D:  01/10/2009  T:  01/10/2009  Job:  [id number]

## 2011-01-13 NOTE — Op Note (Signed)
Brandy [name], Brandy [name]             ACCOUNT NO.:  [id number]   MEDICAL RECORD NO.:  [id number]          PATIENT TYPE:  INP   LOCATION:  1421                         FACILITY:  [facility]   PHYSICIAN:  Bertram Millard. Dahlstedt, M.D.DATE OF BIRTH:  Sep 24, 1985   DATE OF PROCEDURE:  04/02/2007  DATE OF DISCHARGE:                               OPERATIVE REPORT   PREOPERATIVE DIAGNOSIS:  Left ureteral calculus.   POSTOPERATIVE DIAGNOSIS:  Left ureteral calculus.   PROCEDURE:  Left ureteroscopy with stone extraction.   SURGEON:  Bertram Millard. Dahlstedt, M.D.   ANESTHESIA:  General endotracheal.   COMPLICATIONS:  None.   ESTIMATED BLOOD LOSS:  None.   SPECIMEN:  Stone to patient.   BRIEF HISTORY:  25 year old female with a prior history of ureteral  calculi.  She has been in the hospital four days with left flank pain.  She has been on a morphine PCA.  Due to the persistence of her pain and  the fact that the patient does not want to go home without passing the  stone, it was recommended this morning that she have a stone extraction.  CT urogram verified a 1 mm distal ureteral stone.  She is aware of the  procedure.  Her family has a long standing history of kidney stones.  I  spoke with her parents this morning about stone extraction.  They  desired to proceed.   DESCRIPTION OF PROCEDURE:  The patient was administered preoperative IV  antibiotics, and the site of her surgery marked, she was identified in  the holding area and taken to the OR where general anesthetic was  administered.  She was placed in the dorsal lithotomy position.  Her  genitalia and perineum were prepped and draped.  A 6 French ureteroscope  was advanced through the urethra into the bladder.  The bladder was  inspected and no stones were seen.  Attempts were made at passing  through the left ureteral orifice.  This was difficult due to its  tightness.  I then passed a guidewire through the ureteroscope, backed  the  ureteroscope out with the guidewire in place.  I then passed the  ureteroscope beside the guidewire.  This was done somewhat blindly for  the first 1-2 cm as it was quite tight.  I then passed ureteroscope all  the way up to the proximal ureter without seeing the stone.  As I backed  the scope out, I saw in the area of the UVJ that there was some  narrowing.  I then backed the scope out of the ureteral orifice.  No  stone was seen.  I then looked up posteriorly in the bladder wall and  the stone was there.  No  doubt was jarred loose by removing the scope.  This stone was extracted.  The guidewire was removed.  The bladder was drained.  The procedure was  terminated.  The patient tolerated the procedure well.  She was awakened  and taken to the PACU in stable condition.      Bertram Millard. Dahlstedt, M.D.  Electronically Signed     SMD/MEDQ  D:  04/02/2007  T:  04/02/2007  Job:  [id number]

## 2011-01-13 NOTE — Discharge Summary (Signed)
NAMEGALILEAH, Brandy [name]             ACCOUNT NO.:  [id number]   MEDICAL RECORD NO.:  [id number]          PATIENT TYPE:  INP   LOCATION:  1421                         FACILITY:  [facility]   PHYSICIAN:  Maretta Bees. Vonita Moss, M.D.DATE OF BIRTH:  April 13, 1986   DATE OF ADMISSION:  03/30/2007  DATE OF DISCHARGE:  04/02/2007                               DISCHARGE SUMMARY   FINAL DIAGNOSES:  1. Distal left ureteral calculus.  2. History of stones.  3. History of depression.  4. Acne.   PROCEDURE:  Cystoscopy and left ureteroscopic stone manipulation by Dr.  Retta Diones on April 02, 2007.   HISTORY:  This young white female with a long history of stone disease,  was admitted for pain relief for a left ureteral calculus but  subsequently needed ureteroscopic removal because of continued pain and  discomfort.  A CT scan documented a 1-mm stone in the very distal left  ureter, and Dr. Retta Diones performed a ureteroscopic stone manipulation  and extraction on April 02, 2007.  The patient was sent home on diet  and activity as tolerated and to continue her Celexa and doxycycline.  She was sent home in a much improved condition.      Maretta Bees. Vonita Moss, M.D.  Electronically Signed     LJP/MEDQ  D:  04/13/2007  T:  04/14/2007  Job:  [id number]

## 2011-01-13 NOTE — H&P (Signed)
NAMESABRIYA, YONO             ACCOUNT NO.:  [id number]   MEDICAL RECORD NO.:  [id number]          PATIENT TYPE:  EMS   LOCATION:  ED                           FACILITY:  [facility]   PHYSICIAN:  Georgina Quint. Plotnikov, MDDATE OF BIRTH:  01/16/1986   DATE OF ADMISSION:  01/10/2009  DATE OF DISCHARGE:                              HISTORY & PHYSICAL   CHIEF COMPLAINT:  Abdominal pain, right flank pain.   HISTORY OF PRESENT ILLNESS:  The patient is a 25 year old female with a  history of multiple episodes of kidney stone attacks requiring urologic  procedures that comes in with complaint of severe right flank/right  abdominal pain that started last night.  She has been complaining of  frequent urination over past 2 weeks, tonight developed nausea and  vomiting.  She is very weak.  She rates her pain at 5/8, no radiation.  To her it feels like another kidney stone.  She states she was most  troubled with tiny stone of 1 mm in the past.  She received Dilaudid and  lorazepam with Zofran with some relief.  However, it still hurts quite a  bit.   ALLERGIES:  PENICILLIN.   MEDICINES:  Prozac, K citrate 1080 twice a day, doxycycline.   PAST MEDICAL HISTORY:  Kidney stones multiple attacks (calcium oxalate),  panic attacks.   FAMILY HISTORY:  Mother with multiple episodes of kidney stones.   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   REVIEW OF SYSTEMS:  Increased urination over past 2 weeks, abdominal  pain, nausea, vomiting starting last night.  No fever.  Does have some  chills.  No syncope.  No neurologic complaints.  Occasional panic  attacks.  The rest of the 18 point review of systems is as above or  negative.   PHYSICAL TEMPERATURE:  VITAL SIGNS:  Temperature 98.6, blood pressure  121/98, heart rate 90, respirations 21, sats 98% on room air.  GENERAL:  She is in mild acute distress  with pain.  HEENT:  With dry oral mucosa.  Neck supple.  No meningeal signs.  Pupils  reactive.  LUNGS:  Clear.  No wheezes.  HEART:  S1, S2, no gallop, no murmur.  Slightly tachycardic.  ABDOMEN:  Slightly distended, sensitive, no rebound symptoms.  No masses  felt.  BACK:  LS spine nontender.  PELVIC/RECTAL:  Per Dr. Denton Lank.   LABS:  Urine pregnancy test negative.  Urinalysis within normal limits.  White count 7.2, hemoglobin 13.3, platelets 220, sodium 140, potassium  3.2, creatinine 0.79, BUN 13, lipase 30.  CT of the abdomen and pelvis  with 3.2 cm right ovarian cyst, fecal burden increased on the right side  large, right lower pole nonobstructive calculus.   ASSESSMENT AND PLAN:  1. Right flank pain with right abdominal pain likely due to problem      #2.  2. Renal colic on the right side.  Obtain urology consult.  Start      Toradol, IV fluids.  In  the patient's experience this feels like      her kidney stones in the past.  3. Polyuria due to problem #2.  4. Hypokalemia.  IV potassium.  5. Nausea and vomiting.  Will use Zofran.  6. Dehydration.  IV fluids.  7. Constipation.  Will use lactulose.      Georgina Quint. Plotnikov, MD  Electronically Signed     AVP/MEDQ  D:  01/10/2009  T:  01/10/2009  Job:  [id number]   cc:   Tinnie Gens A. Tawanna Cooler, MD  [address] [place]  [place] [address]   Bertram Millard. Dahlstedt, M.D.  Fax: [phone]

## 2011-01-13 NOTE — Discharge Summary (Signed)
NAMETELENA, Brandy [name]             ACCOUNT NO.:  [id number]   MEDICAL RECORD NO.:  [id number]          PATIENT TYPE:  INP   LOCATION:  1333                         FACILITY:  [facility]   PHYSICIAN:  Valerie A. Felicity Coyer, MDDATE OF BIRTH:  1986/06/17   DATE OF ADMISSION:  01/10/2009  DATE OF DISCHARGE:                               DISCHARGE SUMMARY   DISCHARGE DIAGNOSES:  1. Right flank pain due to renal colic status post cystoscopy with      right retrograde pyelogram, ureteroscopy and stent, improved.  2. Urinary hesitation with frequency, presumably secondary to above.      Outpatient urology followup.  3. Weight loss and fatigue with normal TSH, normal B12, normal      sedimentation rate, normal A1c, question related to depression.      Continue Prozac as prior to admission.  4. Hypokalemia prior to admission, resolved.  Continue home treatment.  5. Constipation at time of admission, treated and resolved.  6. History of depression.  See above.  Continue home treatment with      outpatient followup including therapy and psych as needed.  7. Right ovarian cyst.  Outpatient gynecology followup with Dr. Ambrose Mantle      recommended.   DISCHARGE MEDICATIONS:  Are as prior to admission and include:  1. Prozac 20 mg daily.  2. Doxycycline 100 mg b.i.d.  3. Potassium 20 mEq b.i.d..  4. Also Vicodin 5/500 one p.o. q. 4-6 hours p.r.n. moderate to severe      pain, dispense 20, no refills.  5. Zofran 4 mg 1 p.o. q. 4-6 hours p.r.n. nausea, vomiting, dispensed      10, no refills.   HOSPITAL FOLLOW-UP:  Will be with Dr. Tawanna Cooler and Dr. Ambrose Mantle, PCP and  gynecologist respectively, as needed.  Also with urology, Dr. Vernie Ammons,  to call for appointment next week for stent removal and other problems  should they arise.   CONDITION ON DISCHARGE:  Pain free, hemodynamically stable, and anxious  for discharge home.   HOSPITAL COURSE BY PROBLEM:  1. Right flank pain.  Brandy patient is a 25 year old woman  with history      of recurrent kidney stones who came to [facility] emergency room      complaining of intractable right-sided flank pain similar to that      of prior kidney stones.  She is also being treated for depression      and because of inability to control these symptoms with oral      medications, she was admitted for further evaluation and treatment.      She was started on Toradol, IV fluids, and seen in consultation by      urology.  Despite efforts at different modalities of pain control,      Brandy patient was unable to achieve comfort and urology was uncertain      if small renal stone was Brandy cause of her symptoms.  However, no      other etiology could be identified, and a repeat CT scan did show      small calcium within Brandy region of  right ureter.  Because of her      intractable symptoms, she underwent procedure as described above.      on Brandy morning of discharge by Dr. Vernie Ammons.  Brandy patient felt      markedly improved after placement of Brandy stent and was anxious for      discharge home.  She will follow up next week with Dr. Vernie Ammons for      stent removal as per protocol.  There is no evidence of urinary      tract infection or other GI abnormality for cause of this pain.      She is felt stable for discharge home.  2. Depression.  Brandy patient has been recently increased on her dose of      Prozac and this has been associated with      symptoms of weight loss and anorexia despite feeling happy and      love.  I have recommended to her to follow up with her primary      care physician and/or psychiatry/therapy for further evaluation and      treatment of possible depression symptoms even in Brandy absence of      tearfulness and feelings of sadness.      Valerie A. Felicity Coyer, MD  Electronically Signed     VAL/MEDQ  D:  01/13/2009  T:  01/14/2009  Job:  [id number]

## 2011-01-13 NOTE — H&P (Signed)
NAMEJENEANE, Brandy [name]             ACCOUNT NO.:  [id number]   MEDICAL RECORD NO.:  [id number]          PATIENT TYPE:  INP   LOCATION:  1421                         FACILITY:  [facility]   PHYSICIAN:  Maretta Bees. Vonita Moss, M.D.DATE OF BIRTH:  02/28/1986   DATE OF ADMISSION:  03/30/2007  DATE OF DISCHARGE:  04/02/2007                              HISTORY & PHYSICAL   HISTORY:  This 25 year old female was admitted with severe unremitting  left flank pain with nausea.  She was felt to have a left ureteral  calculus.  She was admitted initially for pain relief and discomfort.   She has a past history of stone manipulations.  She has history of  depression and acne.   MEDICATIONS:  Celexa 20 mg a day and doxycycline 100 mg b.i.d.   ALLERGIES:  She is allergic to PENICILLIN.   REVIEW OF SYSTEMS:  She is in general good health.  She had no headache,  chest pain, shortness of breath, asthma, diarrhea, blood in her stools  or other problems.   PHYSICAL EXAMINATION:  Blood pressure of 109/66, pulse 82, temperature  98.5.  She is a young, thin white female in severe distress and in  obvious pain.  She is alert and oriented.  NECK is supple.  CHEST:  No respiratory distress.  CARDIOVASCULAR:  Heart tones regular.  ABDOMEN: Slightly guarded in the left flank, no evidence of an acute  abdomen.   IMPRESSION:  Suspected left ureteral calculus.   PLAN:  IV fluids, antiemetics and IV pain meds and continued observation  and straining of her urine.      Maretta Bees. Vonita Moss, M.D.  Electronically Signed     LJP/MEDQ  D:  04/13/2007  T:  04/14/2007  Job:  [id number]

## 2011-01-13 NOTE — Op Note (Signed)
Brandy [name], Brandy [name]             ACCOUNT NO.:  [id number]   MEDICAL RECORD NO.:  [id number]          PATIENT TYPE:  INP   LOCATION:  1333                         FACILITY:  [facility]   PHYSICIAN:  Mark C. Vernie Ammons, M.D.  DATE OF BIRTH:  Dec 14, 1985   DATE OF PROCEDURE:  01/13/2009  DATE OF DISCHARGE:  01/13/2009                               OPERATIVE REPORT   PREOPERATIVE DIAGNOSIS:  Right flank pain (rule out distal ureteral  stone).   POSTOPERATIVE DIAGNOSIS:  Right flank pain.   PROCEDURES:  1. Cystoscopy with right retrograde pyelogram.  2. Diagnostic right ureteroscopy.  3. Right double-J stent placement.   SURGEON:  Mark C. Vernie Ammons, MD.   ANESTHESIA:  General endotracheal.   SPECIMENS:  None.   BLOOD LOSS:  None.   DRAINS:  5-French 24-cm Polaris stent (with string).   COMPLICATIONS:  None.   INDICATIONS:  The patient is a 25 year old white female who has a  history of kidney stones as well as a mother who has had an extensive  history of stones.  She presented with right flank pain and was admitted  to the hospital.  She continued to have persistent severe right flank  pain with nausea and vomiting during her hospitalization.  Initially,  her urinalysis was clear and her CT scan revealed no evidence of stone  or obstruction.  Because of persisting pain, repeat urinalyses were  obtained which showed at the most 0-2 red blood cells.  A repeat CT scan  was also obtained and revealed a sub-millimeter calcification in the  pelvis on the right-hand side in the proximity of the course of the  right ureter.  This was yesterday and today her pain had eased somewhat  but was still persistent and we discussed ureteroscopic evaluation and  extraction of the stone if present.  The risks, complications and  alternatives have been discussed, she understands and has elected to  proceed.   DESCRIPTION OF OPERATION:  After informed consent, the patient was  brought to the major OR  and placed on the table, administered general,  anesthesia then moved to the dorsal lithotomy position.  Her genitalia  was sterilely prepped and draped and an official time-out was then  performed.   Cystoscopy was performed initially with the 21-French rigid cystoscope  and 12 degree lens.  I note the urethra appears to be normal.  The  bladder was then entered and fully inspected and noted to be free of any  tumor, stones or inflammatory lesions.  Ureteral orifices were of normal  configuration and position.  There was no evidence of inflammation in  the area of the right ureteral orifice.   A 5-French open-ended ureteral catheter was then introduced into the  opening of the right ureteral orifice and a retrograde pyelogram was  performed in standard fashion under direct fluoroscopic control.  Approximately 5 mL of full-strength contrast was injected through the  catheter and I note the ureter to be entirely normal throughout its  length without any definite filling defect seen.  There was no evidence  of abnormality of the collecting system.  A 0.038-inch floppy-tipped guidewire was then passed up the ureter into  the area of the renal pelvis under direct fluoroscopic control and the  inner portion of the ureteral access sheath was passed over the  guidewire and used to gently dilate ureteral orifice and intramural  ureter.   I removed the ureteral access sheath and the guidewire and passed a 6-  French rigid ureteroscope under direct vision into the bladder and into  the right ureteral orifice without difficulty.  This was performed under  direct vision the entire time and no evidence of stone was seen in the  distal ureter nor was there any significant inflammation in this region.  I was able to pass the ureteroscope under direct vision all the way into  the area of the renal pelvis and no stone was seen along the course of  the ureter.  I then reinserted the guidewire and  backed the scope over  the guidewire again, observing the ureter and no stone or area of  obstruction was identified.  I, therefore, removed the ureteroscope and  back-loaded the cystoscope over the guidewire and passed the Polaris  stent over the guidewire into the area of the renal pelvis where a good  curl was noted in the renal pelvis as I removed the guidewire.  The  bladder was drained and the patient was awakened and taken to the  recovery room in stable and satisfactory condition.  She tolerated the  procedure well and there were no intraoperative complications.  She did  receive a B & O suppository at the end of the operation and received 400  mg of Cipro IV preoperatively.      Mark C. Vernie Ammons, M.D.  Electronically Signed     MCO/MEDQ  D:  01/13/2009  T:  01/13/2009  Job:  [id number]

## 2011-02-19 ENCOUNTER — Ambulatory Visit (INDEPENDENT_AMBULATORY_CARE_PROVIDER_SITE_OTHER): Payer: PRIVATE HEALTH INSURANCE | Admitting: Family Medicine

## 2011-02-19 ENCOUNTER — Encounter: Payer: Self-pay | Admitting: Family Medicine

## 2011-02-19 DIAGNOSIS — K219 Gastro-esophageal reflux disease without esophagitis: Secondary | ICD-10-CM | POA: Insufficient documentation

## 2011-02-19 DIAGNOSIS — F411 Generalized anxiety disorder: Secondary | ICD-10-CM

## 2011-02-19 DIAGNOSIS — F329 Major depressive disorder, single episode, unspecified: Secondary | ICD-10-CM

## 2011-02-19 DIAGNOSIS — F3289 Other specified depressive episodes: Secondary | ICD-10-CM

## 2011-02-19 HISTORY — DX: Gastro-esophageal reflux disease without esophagitis: K21.9

## 2011-02-19 MED ORDER — FLUOXETINE HCL 20 MG PO CAPS: 20.0000 mg | ORAL_CAPSULE | Freq: Every day | ORAL | Status: DC

## 2011-02-19 NOTE — Progress Notes (Signed)
  Subjective:    Patient ID: Brandy [name], female    DOB: 28-Jun-1986, 25 y.o.   MRN: [id number]  HPI  Brandy [name] is a delightful 25 year old single female, nonsmoker, who comes in today for evaluation of nausea as reflux, weakness, anxiety.  Her symptoms now have been going on for about 3 months.  We initially put her on Corgard and Wellbutrin, but she said it didn't help.  In the past.  The Prozac has helped, but she was concerned about weight gain with the Prozac.  She has stopped the Wellbutrin in the Corgard.  She is also having trouble with nausea, reflux, esophagitis, and chest pain.  The symptoms, come and go last about 15 minutes and are diminished by OTC Prilosec.  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  LMP two weeks ago, normal not sexually active    Review of Systems General GI and cardiac and psychiatric review of systems negative    Objective:   Physical Exam    Will punish him in acute distress.  Heart and lungs are normal.  Abdomen normal    Assessment & Plan:  Reflux esophagitis.  Plan anti-reflex program.  Follow-up in two weeks.  Number two anxiety restart Prozac 20 nightly follow-up in two weeks

## 2011-02-19 NOTE — Patient Instructions (Signed)
Begin the anti-reflex program by taking Prilosec one daily in the morning for one week.  If after one week your symptoms have abated and then continue with one a day.  If, however, in one week.  The symptoms have not abated than doubled the dose to one twice daily.  Restart the Prozac 20 mg nightly and take her Klonopin p.r.n.  Follow-up in two weeks.  It is also helpful to exercise on a daily basis, 15 minutes of walking is helpful

## 2011-02-25 ENCOUNTER — Emergency Department (HOSPITAL_COMMUNITY): Payer: 59

## 2011-02-25 ENCOUNTER — Emergency Department (HOSPITAL_COMMUNITY)
Admission: EM | Admit: 2011-02-25 | Discharge: 2011-02-25 | Disposition: A | Discharge status: Home / Self Care | Payer: 59 | Attending: Emergency Medicine | Admitting: Emergency Medicine

## 2011-02-25 DIAGNOSIS — R109 Unspecified abdominal pain: Secondary | ICD-10-CM | POA: Insufficient documentation

## 2011-02-25 DIAGNOSIS — Z87442 Personal history of urinary calculi: Secondary | ICD-10-CM | POA: Insufficient documentation

## 2011-02-25 DIAGNOSIS — R300 Dysuria: Secondary | ICD-10-CM | POA: Insufficient documentation

## 2011-02-25 DIAGNOSIS — R110 Nausea: Secondary | ICD-10-CM | POA: Insufficient documentation

## 2011-02-25 LAB — URINALYSIS, ROUTINE W REFLEX MICROSCOPIC
Bilirubin Urine: NEGATIVE
Ketones, ur: NEGATIVE mg/dL
Specific Gravity, Urine: 1.016 (ref 1.005–1.030)
Urobilinogen, UA: 0.2 mg/dL (ref 0.0–1.0)
pH: 8 (ref 5.0–8.0)

## 2011-02-25 LAB — COMPREHENSIVE METABOLIC PANEL
ALT: 8 U/L (ref 0–35)
Alkaline Phosphatase: 51 U/L (ref 39–117)
CO2: 27 mEq/L (ref 19–32)
Chloride: 103 mEq/L (ref 96–112)
GFR calc Af Amer: >60 mL/min (ref >60)
GFR calc non Af Amer: >60 mL/min (ref >60)
Glucose, Bld: 90 mg/dL (ref 70–99)
Potassium: 3.9 mEq/L (ref 3.5–5.1)
Sodium: 139 mEq/L (ref 135–145)
Total Bilirubin: 0.3 mg/dL (ref 0.3–1.2)

## 2011-02-25 LAB — DIFFERENTIAL
Basophils Absolute: 0 10*3/uL (ref 0.0–0.1)
Basophils Relative: 1 % (ref 0–1)
Lymphocytes Relative: 16 % (ref 12–46)
Neutro Abs: 5.1 10*3/uL (ref 1.7–7.7)
Neutrophils Relative %: 79 % — ABNORMAL HIGH (ref 43–77)

## 2011-02-25 LAB — CBC
Hemoglobin: 12.5 g/dL (ref 12.0–15.0)
RBC: 4.22 MIL/uL (ref 3.87–5.11)

## 2011-02-25 LAB — URINE MICROSCOPIC-ADD ON

## 2011-02-25 LAB — WET PREP, GENITAL: Clue Cells Wet Prep HPF POC: NONE SEEN

## 2011-02-26 LAB — GC/CHLAMYDIA PROBE AMP, GENITAL: Chlamydia, DNA Probe: NEGATIVE

## 2011-03-03 ENCOUNTER — Emergency Department (HOSPITAL_COMMUNITY)
Admission: EM | Admit: 2011-03-03 | Discharge: 2011-03-04 | Disposition: A | Discharge status: Home / Self Care | Payer: PRIVATE HEALTH INSURANCE | Attending: Emergency Medicine | Admitting: Emergency Medicine

## 2011-03-03 ENCOUNTER — Ambulatory Visit (INDEPENDENT_AMBULATORY_CARE_PROVIDER_SITE_OTHER): Payer: PRIVATE HEALTH INSURANCE | Admitting: Family Medicine

## 2011-03-03 ENCOUNTER — Emergency Department (HOSPITAL_COMMUNITY): Payer: PRIVATE HEALTH INSURANCE

## 2011-03-03 ENCOUNTER — Telehealth: Payer: Self-pay | Admitting: Family Medicine

## 2011-03-03 ENCOUNTER — Encounter: Payer: Self-pay | Admitting: Family Medicine

## 2011-03-03 DIAGNOSIS — R109 Unspecified abdominal pain: Secondary | ICD-10-CM

## 2011-03-03 DIAGNOSIS — Z87442 Personal history of urinary calculi: Secondary | ICD-10-CM

## 2011-03-03 DIAGNOSIS — R112 Nausea with vomiting, unspecified: Secondary | ICD-10-CM | POA: Insufficient documentation

## 2011-03-03 LAB — COMPREHENSIVE METABOLIC PANEL
AST: 13 U/L (ref 0–37)
BUN: 10 mg/dL (ref 6–23)
CO2: 25 mEq/L (ref 19–32)
Chloride: 100 mEq/L (ref 96–112)
Creatinine, Ser: 0.76 mg/dL (ref 0.50–1.10)
GFR calc non Af Amer: >60 mL/min (ref >60)
Total Bilirubin: 0.3 mg/dL (ref 0.3–1.2)

## 2011-03-03 LAB — POCT URINALYSIS DIPSTICK
Bilirubin, UA: NEGATIVE
Glucose, UA: NEGATIVE
Leukocytes, UA: NEGATIVE
Nitrite, UA: NEGATIVE
pH, UA: 6.5

## 2011-03-03 LAB — DIFFERENTIAL
Lymphs Abs: 1.7 10*3/uL (ref 0.7–4.0)
Monocytes Relative: 9 % (ref 3–12)
Neutro Abs: 4.4 10*3/uL (ref 1.7–7.7)
Neutrophils Relative %: 65 % (ref 43–77)

## 2011-03-03 LAB — CBC
HCT: 40.9 % (ref 36.0–46.0)
Hemoglobin: 13.6 g/dL (ref 12.0–15.0)
MCV: 90.3 fL (ref 78.0–100.0)
Platelets: 283 10*3/uL (ref 150–400)
RBC: 4.53 MIL/uL (ref 3.87–5.11)
WBC: 6.8 10*3/uL (ref 4.0–10.5)

## 2011-03-03 LAB — URINALYSIS, ROUTINE W REFLEX MICROSCOPIC
Hgb urine dipstick: NEGATIVE
Protein, ur: 30 mg/dL — AB
Urobilinogen, UA: 1 mg/dL (ref 0.0–1.0)

## 2011-03-03 LAB — URINE MICROSCOPIC-ADD ON

## 2011-03-03 MED ORDER — OXYCODONE-ACETAMINOPHEN 7.5-500 MG PO TABS: 1.0000 | ORAL_TABLET | Freq: Four times a day (QID) | ORAL | Status: DC | PRN

## 2011-03-03 MED ORDER — KETOROLAC TROMETHAMINE 60 MG/2ML IM SOLN
60.0000 mg | Freq: Once | INTRAMUSCULAR | Status: AC
  Administered 2011-03-03: 60 mg via INTRAMUSCULAR

## 2011-03-03 MED ORDER — ONDANSETRON HCL 4 MG PO TABS: ORAL_TABLET | ORAL | Status: DC

## 2011-03-03 MED ORDER — PROMETHAZINE HCL 25 MG/ML IJ SOLN
25.0000 mg | Freq: Four times a day (QID) | INTRAMUSCULAR | Status: DC | PRN
  Administered 2011-03-03: 25 mg via INTRAVENOUS

## 2011-03-03 NOTE — Telephone Encounter (Signed)
Pts mom called and said that Zofran is not helping the nausea or vomiting. Pt req a stronger nausea med. Pls call in to [facility] asap.

## 2011-03-03 NOTE — Progress Notes (Signed)
  Subjective:    Patient ID: Brandy [name], female    DOB: 09-12-1985, 25 y.o.   MRN: [id number]  HPI Uchenna is a 25 year old single female, nonsmoker, who comes in today for evaluation of abdominal pain.  She states one week ago she developed the sudden onset of severe left flank pain that radiated down to her groin.  She's had a history of kidney stones in the past.  The following day Wednesday.  She went to the emergency room and had a number of diagnostic scan and lab work.  Lab work was all normal.  No blood in the urine.  CT scan shows multiple small stones in both kidneys.  No hydronephrosis.  She was sent home on liquids Phenergan, and Flomax in the last 3 days has developed nausea and vomiting and can't keep any medication down.  Her pain is persisted to the point she is having vaginal and rectal spasms.  She denies any fever, but says she's having chills.  She has had 8 CT scans in 5 years.     Review of Systems    Review of systems negative.  LMP one week ago, normal Objective:   Physical Exam    Well-developed well-nourished, female, in severe distress with nausea and pain.  Abdominal exam the abdomen was flat.  The bowel sounds are normal.  No tenderness.  No rebound.  Urinalysis shows specific gravity of 1010 no red cells no white cells    Assessment & Plan:  Left kidney stone with nausea and vomiting.  Plan Toradol and Phenergan IM now rests at home, liquids, Zofran, Percocet for pain.  Call me tomorrow at one o'clock for follow-up if her symptoms get worse.  She is to go to the emergency room tonight

## 2011-03-03 NOTE — Telephone Encounter (Signed)
Since the Zofran is not working.  I would recommend she go to the hospital for IV fluids

## 2011-03-03 NOTE — Telephone Encounter (Signed)
Spoke with mom

## 2011-03-03 NOTE — Patient Instructions (Signed)
Stop all your medications.  Sips of ginger L. Every 5 minutes.  The Zofran every 4 to 6 hours for nausea.  Percocet one half tab every 4 to 6 hours for severe pain.  Call me at one o'clock tomorrow at [phone] with a progress report.  If his symptoms get worse.  He cannot keep anything down and go to the emergency room tonight.  u  may have to be admitted for a day or two with IV fluids

## 2011-03-05 ENCOUNTER — Ambulatory Visit: Payer: PRIVATE HEALTH INSURANCE | Admitting: Family Medicine

## 2011-03-05 LAB — URINE CULTURE

## 2011-05-19 ENCOUNTER — Encounter: Payer: Self-pay | Admitting: Family Medicine

## 2011-05-19 ENCOUNTER — Ambulatory Visit (INDEPENDENT_AMBULATORY_CARE_PROVIDER_SITE_OTHER): Payer: PRIVATE HEALTH INSURANCE | Admitting: Family Medicine

## 2011-05-19 ENCOUNTER — Other Ambulatory Visit: Payer: Self-pay | Admitting: Family Medicine

## 2011-05-19 VITALS — BP 110/70 | Temp 98.0°F | Wt 170.0 lb

## 2011-05-19 DIAGNOSIS — M545 Low back pain, unspecified: Secondary | ICD-10-CM

## 2011-05-19 DIAGNOSIS — M549 Dorsalgia, unspecified: Secondary | ICD-10-CM

## 2011-05-19 MED ORDER — MEPERIDINE HCL 50 MG PO TABS: 50.0000 mg | ORAL_TABLET | Freq: Three times a day (TID) | ORAL | Status: DC | PRN

## 2011-05-19 NOTE — Progress Notes (Signed)
  Subjective:    Patient ID: Brandy [name], female    DOB: 12-12-1985, 25 y.o.   MRN: [id number]  HPI Brandy [name] is a 25 year old single female, nonsmoker, who works full-time in the pharmacy.  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  She states that last Tuesday.  She had the gradual onset of back pain.  She, says it's central back pain and off to the left lower lumbar area.  She describes as constant, severe, an 8 on a scale of one to 10.  She states as a dull ache.  It does not radiate down her legs.  No bowel or bladder symptoms.  She's tried over-the-counter medications.  He nothing seems to help.  The pain is increased by bending.  No history of trauma.  Never had a history of back problems in the past.  LMP 821.  She did have a right ovarian cyst, which kidney stone about 6 months ago.  It was asymptomatic and apparently resolved on its own.  She is pretty sure it was on the right side.   Review of Systems    General GI, GU, and neuromuscular and neurologic review of systems otherwise negative Objective:   Physical Exam  Well-developed female in acute pain.  Examination the abdomen is normal.  Pelvic examination external genitalia.  Normal limits.  Vaginal vault was normal.  Bimanual exam was normal.  No tenderness in the left adnexal area with palpation.  Neuromuscular exam, sensation and strength.  Reflexes.  Straight leg raising on normal      Assessment & Plan:  Mid and left lower lumbar back pain, question etiology.  Plan workup with ultrasound.  Rule out ovarian cyst.  Demerol p.r.n. For pain

## 2011-05-19 NOTE — Patient Instructions (Signed)
Try Motrin during the day and Demerol one or two tabs at bedtime p.r.n. For severe pain.  We will try to set up her ultrasound for Friday at 4 clock.  See me at 434 to 5 on Tuesday for follow-up

## 2011-05-20 ENCOUNTER — Other Ambulatory Visit: Payer: 59

## 2011-05-20 ENCOUNTER — Inpatient Hospital Stay: Admission: RE | Admit: 2011-05-20 | Payer: 59 | Source: Ambulatory Visit

## 2011-05-25 ENCOUNTER — Encounter: Payer: Self-pay | Admitting: Family Medicine

## 2011-05-26 ENCOUNTER — Ambulatory Visit: Payer: 59 | Admitting: Family Medicine

## 2011-05-28 LAB — URINALYSIS, ROUTINE W REFLEX MICROSCOPIC
Nitrite: NEGATIVE
Protein, ur: 30 — AB
Urobilinogen, UA: 0.2
pH: 6

## 2011-05-28 LAB — BASIC METABOLIC PANEL
CO2: 26
Chloride: 106
Glucose, Bld: 91
Potassium: 3.6
Sodium: 142

## 2011-05-28 LAB — CBC
HCT: 40.2
Hemoglobin: 13.5
MCHC: 33.6
MCV: 92.4
RDW: 12.4

## 2011-05-28 LAB — DIFFERENTIAL
Basophils Absolute: 0
Basophils Relative: 0
Eosinophils Relative: 0
Monocytes Absolute: 0.6
Monocytes Relative: 7

## 2011-05-28 LAB — URINE MICROSCOPIC-ADD ON

## 2011-05-28 LAB — PREGNANCY, URINE: Preg Test, Ur: NEGATIVE

## 2011-06-04 ENCOUNTER — Ambulatory Visit (INDEPENDENT_AMBULATORY_CARE_PROVIDER_SITE_OTHER): Payer: 59 | Admitting: Family Medicine

## 2011-06-04 ENCOUNTER — Ambulatory Visit: Payer: 59 | Admitting: Family Medicine

## 2011-06-04 VITALS — BP 108/68 | Temp 98.5°F | Wt 168.0 lb

## 2011-06-04 DIAGNOSIS — J069 Acute upper respiratory infection, unspecified: Secondary | ICD-10-CM

## 2011-06-04 DIAGNOSIS — Z87442 Personal history of urinary calculi: Secondary | ICD-10-CM

## 2011-06-04 DIAGNOSIS — N938 Other specified abnormal uterine and vaginal bleeding: Secondary | ICD-10-CM

## 2011-06-04 DIAGNOSIS — M545 Low back pain, unspecified: Secondary | ICD-10-CM

## 2011-06-04 DIAGNOSIS — N949 Unspecified condition associated with female genital organs and menstrual cycle: Secondary | ICD-10-CM

## 2011-06-04 DIAGNOSIS — M549 Dorsalgia, unspecified: Secondary | ICD-10-CM

## 2011-06-04 DIAGNOSIS — K219 Gastro-esophageal reflux disease without esophagitis: Secondary | ICD-10-CM

## 2011-06-04 HISTORY — DX: Other specified abnormal uterine and vaginal bleeding: N93.8

## 2011-06-04 LAB — POCT URINALYSIS DIPSTICK
Bilirubin, UA: NEGATIVE
Nitrite, UA: NEGATIVE
Spec Grav, UA: 1.025
Urobilinogen, UA: 0.2
pH, UA: 6.5

## 2011-06-04 MED ORDER — ETHYNODIOL DIAC-ETH ESTRADIOL 1-50 MG-MCG PO TABS: 1.0000 | ORAL_TABLET | Freq: Every day | ORAL | Status: DC

## 2011-06-04 MED ORDER — HYDROCODONE-HOMATROPINE 5-1.5 MG/5ML PO SYRP: ORAL_SOLUTION | ORAL | Status: DC

## 2011-06-04 NOTE — Progress Notes (Signed)
  Subjective:    Patient ID: Brandy [name], female    DOB: 02-28-1986, 25 y.o.   MRN: [id number]  HPI Brandy [name] is a delightful 25 year old single female, who comes in today for evaluation of 3 problems.  A couple weeks ago.  We saw her with severe back pain.  Without she had a ruptured ovarian cyst.  However, she never got her ultrasound.  She states once her period start the pain went away.  We discussed options she elected to start BCPs.  This past summer.  She went to the emergency room with severe back pain and was found to have multiple kidney stones.  The urology group in [place].  No longer takes her insurance.  We need to find a urologist to send her to because she has persistent hematuria.  Urinalysis today shows 4+ blood, no white cells, no bacteria, negative nitrates.  She said that congestion, postnasal drip, popping, right ears for 3 to 4 days.  No fever no sputum production.  No wheezing.  She is a nonsmoker   Review of Systems General cardiopulmonary GU GYN review of systems otherwise negative    Objective:   Physical Exam  Well-developed well-nourished, female, no acute distress.  Examination of the HEENT were negative.  Neck was supple.  No adenopathy.  Lungs are clear      Assessment & Plan:  Viral URI OTC therapy, plus Hydromet cough syrup p.r.n.  Multiple kidney stones with hematuria.  Recommend urology consult ASAP.  Dysfunction uterine bleeding with a ruptured ovarian cyst.  Plan start BCPs

## 2011-06-04 NOTE — Patient Instructions (Signed)
Start the [employer].  Drink lots of liquids, Hydromet one half to 1 teaspoon 3 times daily as needed for cough and cold.  Contact her insurance company to find out what urology group.  They would cover we needed to get to a consult ASAP.  Return to see me in 6 weeks for follow-up

## 2011-06-15 LAB — URINALYSIS, ROUTINE W REFLEX MICROSCOPIC
Bilirubin Urine: NEGATIVE
Glucose, UA: NEGATIVE
Specific Gravity, Urine: 1.007
Urobilinogen, UA: 0.2

## 2011-06-15 LAB — URINE MICROSCOPIC-ADD ON

## 2011-07-07 ENCOUNTER — Other Ambulatory Visit: Payer: Self-pay | Admitting: Family Medicine

## 2011-07-13 ENCOUNTER — Encounter: Payer: Self-pay | Admitting: Family Medicine

## 2011-07-13 ENCOUNTER — Ambulatory Visit (INDEPENDENT_AMBULATORY_CARE_PROVIDER_SITE_OTHER): Payer: 59 | Admitting: Family Medicine

## 2011-07-13 VITALS — BP 110/70 | Temp 98.9°F | Wt 167.0 lb

## 2011-07-13 DIAGNOSIS — J45901 Unspecified asthma with (acute) exacerbation: Secondary | ICD-10-CM

## 2011-07-13 MED ORDER — DOXYCYCLINE HYCLATE 100 MG PO TABS: ORAL_TABLET | ORAL | Status: AC

## 2011-07-13 MED ORDER — PREDNISONE 20 MG PO TABS: ORAL_TABLET | ORAL | Status: DC

## 2011-07-13 NOTE — Progress Notes (Signed)
  Subjective:    Patient ID: Brandy [name], female    DOB: May 27, 1986, 25 y.o.   MRN: [id number]  HPI Brandy [name] is a 25 year old single female, nonsmoker, who comes in today for two problems.  She is currently on the Prozac 20 mg daily and doing well.  No panic attacks and wishes to leave the medication as it is  For the past, week she's had a congestion, sore throat, and cough and bringing up yellow-green sputum.  She has a history of allergic rhinitis, but has never we used.   Review of Systems    General psychiatric, and pulmonary review of systems otherwise negative Objective:   Physical Exam  Well-developed well-nourished, female, in no acute distress.  Examination HEENT shows serous otitis media, right ear, left ear normal.  Throat was normal.  Neck was normal with some diffuse adenopathy.  Lungs are clear except for mild expiratory wheezing      Assessment & Plan:  Viral syndrome with secondary asthma.  Plan doxycycline 100 b.i.d., prednisone burst and taper, Hydromet, lots of liquids.

## 2011-07-13 NOTE — Patient Instructions (Signed)
Drink lots of water.  Begin prednisone as directed.  Hydromet p.r.n. For cough.  Doxycycline one twice daily to bottle empty.  If you decide to restart your birth control pills take a baby aspirin too

## 2011-07-16 ENCOUNTER — Telehealth: Payer: Self-pay | Admitting: Family Medicine

## 2011-07-16 MED ORDER — DEXTROMETHORPHAN-GUAIFENESIN 10-100 MG/5ML PO SYRP: 5.0000 mL | ORAL_SOLUTION | Freq: Two times a day (BID) | ORAL | Status: AC

## 2011-07-16 NOTE — Telephone Encounter (Signed)
Generic tussinex 4 oz  One tsp every 12 hrs

## 2011-07-16 NOTE — Telephone Encounter (Signed)
Pt called and said that Hydromet syrup is not working at all. Pt is still coughing and it is non-productive. Pt has been on prednisone since Monday. Nothing is helping and pt is missing school and work. Pt req diff med for cough and congestion to be called in to [facility] on [address] today. Pt aware pcp is out of office today and tomorrow.

## 2011-07-30 ENCOUNTER — Emergency Department (INDEPENDENT_AMBULATORY_CARE_PROVIDER_SITE_OTHER): Payer: 59

## 2011-07-30 ENCOUNTER — Emergency Department (HOSPITAL_BASED_OUTPATIENT_CLINIC_OR_DEPARTMENT_OTHER)
Admission: EM | Admit: 2011-07-30 | Discharge: 2011-07-30 | Disposition: A | Discharge status: Home / Self Care | Payer: 59 | Attending: Emergency Medicine | Admitting: Emergency Medicine

## 2011-07-30 ENCOUNTER — Encounter (HOSPITAL_BASED_OUTPATIENT_CLINIC_OR_DEPARTMENT_OTHER): Payer: Self-pay | Admitting: Emergency Medicine

## 2011-07-30 DIAGNOSIS — S335XXA Sprain of ligaments of lumbar spine, initial encounter: Secondary | ICD-10-CM | POA: Insufficient documentation

## 2011-07-30 DIAGNOSIS — M549 Dorsalgia, unspecified: Secondary | ICD-10-CM

## 2011-07-30 DIAGNOSIS — Y92410 Unspecified street and highway as the place of occurrence of the external cause: Secondary | ICD-10-CM | POA: Insufficient documentation

## 2011-07-30 DIAGNOSIS — M545 Low back pain, unspecified: Secondary | ICD-10-CM

## 2011-07-30 DIAGNOSIS — S39012A Strain of muscle, fascia and tendon of lower back, initial encounter: Secondary | ICD-10-CM

## 2011-07-30 DIAGNOSIS — M542 Cervicalgia: Secondary | ICD-10-CM

## 2011-07-30 HISTORY — DX: Calculus of kidney: N20.0

## 2011-07-30 LAB — PREGNANCY, URINE: Preg Test, Ur: NEGATIVE

## 2011-07-30 MED ORDER — IBUPROFEN 800 MG PO TABS
ORAL_TABLET | ORAL | Status: AC
  Administered 2011-07-30: 800 mg via ORAL
  Filled 2011-07-30: qty 1

## 2011-07-30 MED ORDER — OXYCODONE-ACETAMINOPHEN 5-325 MG PO TABS
ORAL_TABLET | ORAL | Status: AC
  Administered 2011-07-30: 1 via ORAL
  Filled 2011-07-30: qty 1

## 2011-07-30 MED ORDER — OXYCODONE-ACETAMINOPHEN 5-325 MG PO TABS
1.0000 | ORAL_TABLET | Freq: Once | ORAL | Status: AC
  Administered 2011-07-30: 1 via ORAL

## 2011-07-30 MED ORDER — IBUPROFEN 800 MG PO TABS
800.0000 mg | ORAL_TABLET | Freq: Once | ORAL | Status: AC
  Administered 2011-07-30: 800 mg via ORAL
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
## 2011-07-30 NOTE — ED Notes (Signed)
Patient with negative radiographic work up.  Patient given pain medicine.  Cervical collar removed and patient ambulated without difficulty.  Results discussed with patient.  Hilario Quarry, MD 07/30/11 [phone]
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
## 2011-08-26 ENCOUNTER — Other Ambulatory Visit: Payer: Self-pay | Admitting: Family Medicine

## 2011-08-27 ENCOUNTER — Other Ambulatory Visit: Payer: Self-pay | Admitting: Family Medicine

## 2011-08-27 NOTE — Telephone Encounter (Signed)
Pt called and has a kidney stone. Req refill of HYDROcodone-acetaminophen (VICODIN ES) 7.5-750 MG per tablet to [facility] on [address]

## 2011-08-27 NOTE — Telephone Encounter (Signed)
Rx called in to [facility] for Vicodin es 7.5-750.

## 2011-08-27 NOTE — Telephone Encounter (Signed)
ok 

## 2011-09-14 ENCOUNTER — Ambulatory Visit (INDEPENDENT_AMBULATORY_CARE_PROVIDER_SITE_OTHER): Payer: 59 | Admitting: Family Medicine

## 2011-09-14 ENCOUNTER — Encounter: Payer: Self-pay | Admitting: Family Medicine

## 2011-09-14 VITALS — Temp 98.6°F | Wt 167.0 lb

## 2011-09-14 DIAGNOSIS — L0501 Pilonidal cyst with abscess: Secondary | ICD-10-CM | POA: Insufficient documentation

## 2011-09-14 MED ORDER — CEPHALEXIN 500 MG PO CAPS: 500.0000 mg | ORAL_CAPSULE | Freq: Four times a day (QID) | ORAL | Status: AC

## 2011-09-14 NOTE — Patient Instructions (Signed)
Rest at home tonight, and then tomorrow.  Warm soaks twice daily.  Return on Thursday for follow-up.  Keflex two tabs twice daily.  Vicodin one half to one tablet every 4 to 6 hours as needed for pain.

## 2011-09-14 NOTE — Progress Notes (Signed)
  Subjective:    Patient ID: Brandy [name], female    DOB: 11-Nov-1985, 26 y.o.   MRN: [id number]  HPI Brandy [name] is a 26 year old single female, who comes in today for painful lesion on her left buttock .  About a year ago.  She had a painful lesion in the same site went the emergency room and they drained.  It.  However, about two days ago, she began having pain in the area of the previous incision.  The area was just superior and lateral to the coccyx on the b//////////// after informed consent, the lesion was anesthetized with 1% Xylocaine with epinephrine.  I&D was done, and about 4 tablespoons of malodorous pus drained.  Packing was applied.  Dry sterile dressing was applied.  She tolerated the procedure.  No complications.   Review of Systems General and dermatologic review of systems otherwise negative    Objective:   Physical Exam Procedure see above       Assessment & Plan:  Infected pilonidal cyst I&D with packing.  Return in 48 hours for follow-up

## 2011-09-17 ENCOUNTER — Ambulatory Visit (INDEPENDENT_AMBULATORY_CARE_PROVIDER_SITE_OTHER): Payer: 59 | Admitting: Family Medicine

## 2011-09-21 ENCOUNTER — Ambulatory Visit: Payer: 59 | Admitting: Family Medicine

## 2011-10-01 ENCOUNTER — Encounter: Payer: Self-pay | Admitting: Family Medicine

## 2011-10-01 ENCOUNTER — Ambulatory Visit (INDEPENDENT_AMBULATORY_CARE_PROVIDER_SITE_OTHER): Payer: 59 | Admitting: Family Medicine

## 2011-10-01 ENCOUNTER — Telehealth: Payer: Self-pay | Admitting: Family Medicine

## 2011-10-01 NOTE — Telephone Encounter (Signed)
Wants to see Dr Tawanna Cooler today. She had an Mri on Monday at [facility], due to migraines. Now she has no left peripheral vision in both eyes. Please advise.

## 2011-10-01 NOTE — Telephone Encounter (Signed)
Patient coming in today  

## 2011-10-01 NOTE — Telephone Encounter (Signed)
Called her she's in for an office visit today. 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

## 2011-10-02 ENCOUNTER — Ambulatory Visit (INDEPENDENT_AMBULATORY_CARE_PROVIDER_SITE_OTHER): Payer: 59 | Admitting: Family

## 2011-10-02 ENCOUNTER — Encounter: Payer: Self-pay | Admitting: Family

## 2011-10-02 VITALS — BP 118/70 | Temp 98.9°F | Wt 165.0 lb

## 2011-10-02 DIAGNOSIS — F341 Dysthymic disorder: Secondary | ICD-10-CM

## 2011-10-02 DIAGNOSIS — R5383 Other fatigue: Secondary | ICD-10-CM

## 2011-10-02 DIAGNOSIS — F32A Depression, unspecified: Secondary | ICD-10-CM

## 2011-10-02 DIAGNOSIS — G43909 Migraine, unspecified, not intractable, without status migrainosus: Secondary | ICD-10-CM

## 2011-10-02 DIAGNOSIS — R5381 Other malaise: Secondary | ICD-10-CM

## 2011-10-02 DIAGNOSIS — F419 Anxiety disorder, unspecified: Secondary | ICD-10-CM

## 2011-10-02 LAB — POCT URINALYSIS DIPSTICK
Glucose, UA: NEGATIVE
Ketones, UA: NEGATIVE
Protein, UA: NEGATIVE
Urobilinogen, UA: 0.2

## 2011-10-02 LAB — BASIC METABOLIC PANEL
CO2: 24 mEq/L (ref 19–32)
Chloride: 104 mEq/L (ref 96–112)
Creatinine, Ser: 0.8 mg/dL (ref 0.4–1.2)
Potassium: 3.8 mEq/L (ref 3.5–5.1)
Sodium: 137 mEq/L (ref 135–145)

## 2011-10-02 LAB — CBC WITH DIFFERENTIAL/PLATELET
Basophils Relative: 0.5 % (ref 0.0–3.0)
Eosinophils Relative: 0.7 % (ref 0.0–5.0)
Hemoglobin: 14 g/dL (ref 12.0–15.0)
MCV: 91.5 fl (ref 78.0–100.0)
Monocytes Absolute: 0.6 10*3/uL (ref 0.1–1.0)
Neutro Abs: 6.2 10*3/uL (ref 1.4–7.7)
Neutrophils Relative %: 72 % (ref 43.0–77.0)
RBC: 4.57 Mil/uL (ref 3.87–5.11)
WBC: 8.6 10*3/uL (ref 4.5–10.5)

## 2011-10-02 LAB — TSH: TSH: 1.92 u[IU]/mL (ref 0.35–5.50)

## 2011-10-02 NOTE — Patient Instructions (Signed)
Migraine Headache A migraine headache is an intense, throbbing pain on one or both sides of your head. The exact cause of a migraine headache is not always known. A migraine may be caused when nerves in the brain become irritated and release chemicals that cause swelling within blood vessels, causing pain. Many migraine sufferers have a family history of migraines. Before you get a migraine you may or may not get an aura. An aura is a group of symptoms that can predict the beginning of a migraine. An aura may include:  Visual changes such as:   Flashing lights.   Bright spots or zig-zag lines.   Tunnel vision.   Feelings of numbness.   Trouble talking.   Muscle weakness.  SYMPTOMS  Pain on one or both sides of your head.   Pain that is pulsating or throbbing in nature.   Pain that is severe enough to prevent daily activities.   Pain that is aggravated by any daily physical activity.   Nausea (feeling sick to your stomach), vomiting, or both.   Pain with exposure to bright lights, loud noises, or activity.   General sensitivity to bright lights or loud noises.  MIGRAINE TRIGGERS Examples of triggers of migraine headaches include:   Alcohol.   Smoking.   Stress.   It may be related to menses (female menstruation).   Aged cheeses.   Foods or drinks that contain nitrates, glutamate, aspartame, or tyramine.   Lack of sleep.   Chocolate.   Caffeine.   Hunger.   Medications such as nitroglycerine (used to treat chest pain), birth control pills, estrogen, and some blood pressure medications.  DIAGNOSIS  A migraine headache is often diagnosed based on:  Symptoms.   Physical examination.   A computerized X-ray scan (computed tomography, CT) of your head.  TREATMENT  Medications can help prevent migraines if they are recurrent or should they become recurrent. Your caregiver can help you with a medication or treatment program that will be helpful to you.   Lying  down in a dark, quiet room may be helpful.   Keeping a headache diary may help you find a trend as to what may be triggering your headaches.  SEEK IMMEDIATE MEDICAL CARE IF:   You have confusion, personality changes or seizures.   You have headaches that wake you from sleep.   You have an increased frequency in your headaches.   You have a stiff neck.   You have a loss of vision.   You have muscle weakness.   You start losing your balance or have trouble walking.   You feel faint or pass out.  MAKE SURE YOU:   Understand these instructions.   Will watch your condition.   Will get help right away if you are not doing well or get worse.  Document Released: 08/17/2005 Document Revised: 04/29/2011 Document Reviewed: 04/02/2009 [facility] Patient Information 2012 [place], [place].  Anxiety and Panic Attacks Your caregiver has informed you that you are having an anxiety or panic attack. There may be many forms of this. Most of the time these attacks come suddenly and without warning. They come at any time of day, including periods of sleep, and at any time of life. They may be strong and unexplained. Although panic attacks are very scary, they are physically harmless. Sometimes the cause of your anxiety is not known. Anxiety is a protective mechanism of the body in its fight or flight mechanism. Most of these perceived danger situations are  actually nonphysical situations (such as anxiety over losing a job). CAUSES  The causes of an anxiety or panic attack are many. Panic attacks may occur in otherwise healthy people given a certain set of circumstances. There may be a genetic cause for panic attacks. Some medications may also have anxiety as a side effect. SYMPTOMS  Some of the most common feelings are:  Intense terror.   Dizziness, feeling faint.   Hot and cold flashes.   Fear of going crazy.   Feelings that nothing is real.   Sweating.   Shaking.   Chest pain or a fast  heartbeat (palpitations).   Smothering, choking sensations.   Feelings of impending doom and that death is near.   Tingling of extremities, this may be from over-breathing.   Altered reality (derealization).   Being detached from yourself (depersonalization).  Several symptoms can be present to make up anxiety or panic attacks. DIAGNOSIS  The evaluation by your caregiver will depend on the type of symptoms you are experiencing. The diagnosis of anxiety or panic attack is made when no physical illness can be determined to be a cause of the symptoms. TREATMENT  Treatment to prevent anxiety and panic attacks may include:  Avoidance of circumstances that cause anxiety.   Reassurance and relaxation.   Regular exercise.   Relaxation therapies, such as yoga.   Psychotherapy with a psychiatrist or therapist.   Avoidance of caffeine, alcohol and illegal drugs.   Prescribed medication.  SEEK IMMEDIATE MEDICAL CARE IF:   You experience panic attack symptoms that are different than your usual symptoms.   You have any worsening or concerning symptoms.  Document Released: 08/17/2005 Document Revised: 04/29/2011 Document Reviewed: 12/19/2009 [facility] Patient Information 2012 [place], [place].
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
## 2011-10-03 LAB — VITAMIN D 25 HYDROXY (VIT D DEFICIENCY, FRACTURES): Vit D, 25-Hydroxy: 17 ng/mL — ABNORMAL LOW (ref 30–89)

## 2011-10-05 NOTE — Progress Notes (Signed)
  Subjective:    Patient ID: Brandy [name], female    DOB: 06-13-1986, 26 y.o.   MRN: [id number]  HPI  .  Review of Systems     Objective:   Physical Exam        Assessment & Plan:

## 2011-10-06 ENCOUNTER — Other Ambulatory Visit: Payer: Self-pay | Admitting: Family Medicine

## 2011-10-06 ENCOUNTER — Encounter: Payer: Self-pay | Admitting: Family Medicine

## 2011-10-06 ENCOUNTER — Ambulatory Visit (INDEPENDENT_AMBULATORY_CARE_PROVIDER_SITE_OTHER): Payer: 59 | Admitting: Family Medicine

## 2011-10-06 DIAGNOSIS — R5383 Other fatigue: Secondary | ICD-10-CM

## 2011-10-06 DIAGNOSIS — R300 Dysuria: Secondary | ICD-10-CM

## 2011-10-06 DIAGNOSIS — N912 Amenorrhea, unspecified: Secondary | ICD-10-CM

## 2011-10-06 DIAGNOSIS — R5381 Other malaise: Secondary | ICD-10-CM

## 2011-10-06 DIAGNOSIS — N938 Other specified abnormal uterine and vaginal bleeding: Secondary | ICD-10-CM

## 2011-10-06 LAB — POCT URINALYSIS DIPSTICK
Blood, UA: NEGATIVE
Nitrite, UA: NEGATIVE
Urobilinogen, UA: 0.2
pH, UA: 7.5

## 2011-10-06 LAB — POCT URINE PREGNANCY: Preg Test, Ur: NEGATIVE

## 2011-10-06 NOTE — Progress Notes (Signed)
  Subjective:    Patient ID: Brandy [name], female    DOB: 1986/03/19, 26 y.o.   MRN: [id number]  HPI Brookley is a 26 year old single female nonsmoker who comes in today for evaluation of fatigue  She states she does not feel well. She has no physical symptoms except for bilateral breast pain. Her LMP was 2-1/2 weeks ago she had a pregnancy test last Friday which was negative. She stopped taking her birth control pills a couple months ago  She also feels lightheaded no energy and has urinary frequency and some dysuria. No fever sore throat earache cough vomiting diarrhea etc. last bowel movement 2 days ago   Review of Systems    general review of systems negative psychiatric review of systems negative Objective:   Physical Exam   well-developed well-nourished female in no acute distress examination of the HEENT negative neck was supple thyroid was not enlarged chest is clear to auscultation cardiac exam normal stress exam showed bilateral tenderness there is a cystic lesion in her right breast below the nipple at the 6:00 position no discharge abdominal exam was negative pelvic examination external genitalia within normal limits vaginal vault was normal bimanual exam shows normal uterus and ovaries extremities normal skin normal  Office pregnancy test urine negative also UA normal      Assessment & Plan:  Fatigue unknown etiology with normal CBC negative pregnancy test and normal thyroid levels electrolytes and blood sugar check serum pregnancy test

## 2011-10-06 NOTE — Patient Instructions (Signed)
I will call u  this afternoon when I get y  lab work back

## 2012-04-22 ENCOUNTER — Other Ambulatory Visit: Payer: Self-pay | Admitting: Family Medicine

## 2012-04-22 NOTE — Telephone Encounter (Signed)
Ok per Dr Tawanna Cooler.  Rx called in and appointment made

## 2012-04-22 NOTE — Telephone Encounter (Signed)
Pt called to check on status of getting refill of HYDROcodone-acetaminophen (VICODIN ES) 7.5-750 MG per tablet to [facility] on [address]. Pt is aware that pcp is out of office. Pt is in extreme pain.

## 2012-04-22 NOTE — Telephone Encounter (Signed)
Pt call wanted to inform doctor she is experiencing a kidney stone and would like refill asap. Pt is aware doc is out of office

## 2012-04-26 ENCOUNTER — Ambulatory Visit: Payer: 59 | Admitting: Family Medicine

## 2012-05-30 ENCOUNTER — Ambulatory Visit (INDEPENDENT_AMBULATORY_CARE_PROVIDER_SITE_OTHER): Payer: 59 | Admitting: Family Medicine

## 2012-05-30 ENCOUNTER — Encounter: Payer: Self-pay | Admitting: Family Medicine

## 2012-05-30 VITALS — BP 102/70 | Temp 98.7°F | Wt 167.0 lb

## 2012-05-30 DIAGNOSIS — N390 Urinary tract infection, site not specified: Secondary | ICD-10-CM | POA: Insufficient documentation

## 2012-05-30 DIAGNOSIS — M2669 Other specified disorders of temporomandibular joint: Secondary | ICD-10-CM

## 2012-05-30 DIAGNOSIS — R300 Dysuria: Secondary | ICD-10-CM

## 2012-05-30 DIAGNOSIS — M26629 Arthralgia of temporomandibular joint, unspecified side: Secondary | ICD-10-CM

## 2012-05-30 HISTORY — DX: Arthralgia of temporomandibular joint, unspecified side: M26.629

## 2012-05-30 LAB — POCT URINALYSIS DIPSTICK
Bilirubin, UA: NEGATIVE
Ketones, UA: NEGATIVE
Spec Grav, UA: 1.025

## 2012-05-30 MED ORDER — SULFAMETHOXAZOLE-TRIMETHOPRIM 800-160 MG PO TABS: 1.0000 | ORAL_TABLET | Freq: Two times a day (BID) | ORAL | Status: DC

## 2012-05-30 NOTE — Progress Notes (Signed)
  Subjective:    Patient ID: Merdis Delay, female    DOB: 1985-12-01, 26 y.o.   MRN: [id number]  HPI Gera is a 26 year old single female nonsmoker who comes in today for evaluation of 2 problems  5 days ago she noticed dysuria now she is having frequency every hour or 2. No fever chills or back pain. She has a history of a kidney stone in the right renal pelvis although she's had no hematuria.  LMP 3 weeks ago normal  She's had discomfort in the left ear for 3 days no history of trauma. 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

## 2012-05-30 NOTE — Patient Instructions (Signed)
Septra 1 twice daily for 10 days for your urinary tract symptoms  Tylenol 2 tabs 3 times daily, no chewing gum, soft diet, mouth guard when necessary

## 2012-06-03 ENCOUNTER — Encounter: Payer: Self-pay | Admitting: Internal Medicine

## 2012-06-03 ENCOUNTER — Ambulatory Visit (INDEPENDENT_AMBULATORY_CARE_PROVIDER_SITE_OTHER): Payer: 59 | Admitting: Internal Medicine

## 2012-06-03 VITALS — BP 110/70 | Temp 98.2°F | Wt 166.0 lb

## 2012-06-03 DIAGNOSIS — M545 Low back pain, unspecified: Secondary | ICD-10-CM

## 2012-06-03 DIAGNOSIS — K7689 Other specified diseases of liver: Secondary | ICD-10-CM

## 2012-06-03 DIAGNOSIS — N390 Urinary tract infection, site not specified: Secondary | ICD-10-CM

## 2012-06-03 DIAGNOSIS — R162 Hepatomegaly with splenomegaly, not elsewhere classified: Secondary | ICD-10-CM

## 2012-06-03 DIAGNOSIS — R509 Fever, unspecified: Secondary | ICD-10-CM

## 2012-06-03 DIAGNOSIS — Z87442 Personal history of urinary calculi: Secondary | ICD-10-CM

## 2012-06-03 LAB — POCT URINALYSIS DIPSTICK
Glucose, UA: NEGATIVE
Nitrite, UA: NEGATIVE

## 2012-06-03 MED ORDER — CIPROFLOXACIN HCL 500 MG PO TABS: 500.0000 mg | ORAL_TABLET | Freq: Two times a day (BID) | ORAL | Status: DC

## 2012-06-03 NOTE — Progress Notes (Signed)
Subjective:    Patient ID: Brandy [name], female    DOB: [birth year], 26 y.o.   MRN: [id number]  HPI 26 year old patient who has been ill for approximately 9 days she was seen 3 days ago complaining of urinary frequency and urgency and has been on antibiotic therapy for a UTI. The patient presents today Complaint of persistent urinary frequency with urgency. She's also developed fever chills and some night sweats associated with intermittent nausea and vomiting. Her appetite has been poor. She has a history of nephrolithiasis and has required surgical intervention in the past. She describes some mild the right flank discomfort. Her symptoms do not seem like her usual renal colic symptoms in the past. She has a true penicillin allergy.  Past Medical History  Diagnosis Date  . Migraine   . Anxiety   . History of nephrolithiasis   . Depression   . Kidney stone     History   Social History  . Marital Status: Single    Spouse Name: N/A    Number of Children: N/A  . Years of Education: N/A   Occupational History  . Not on file.   Social History Main Topics  . Smoking status: Never Smoker   . Smokeless tobacco: Not on file  . Alcohol Use: No  . Drug Use: No  . Sexually Active: Not on file   Other Topics Concern  . Not on file   Social History Narrative  . No narrative on file    Past Surgical History  Procedure Date  . Kidney stone surgery     3 times    Family History  Problem Relation Age of Onset  . Arthritis    . Diabetes    . Hypertension    . Lung cancer    . Colon cancer    . Breast cancer      Allergies  Allergen Reactions  . Penicillins     REACTION: HIves    Current Outpatient Prescriptions on File Prior to Visit  Medication Sig Dispense Refill  . ethynodiol-ethinyl estradiol (ZOVIA 1/50E, 28,) 1-50 MG-MCG tablet Take 1 tablet by mouth daily.  3 Package  3  . FLUoxetine (PROZAC) 20 MG capsule TAKE ONE CAPSULE BY MOUTH EVERY DAY  100 capsule  2  .  HYDROcodone-acetaminophen (VICODIN ES) 7.5-750 MG per tablet TAKE ONE-HALF TO ONE TABLET BY MOUTH EVERY 8 HOURS AS NEEDED  30 tablet  0  . LORazepam (ATIVAN) 0.5 MG tablet TAKE ONE TABLET BY MOUTH TWICE DAILY AS NEEDED  60 tablet  5  . sulfamethoxazole-trimethoprim (BACTRIM DS,SEPTRA DS) 800-160 MG per tablet Take 1 tablet by mouth 2 (two) times daily.  20 tablet  1    BP 110/70  Temp 98.2 F (36.8 C) (Oral)  Wt 166 lb (75.297 kg)         Review of Systems  Constitutional: Positive for fever, chills, diaphoresis, activity change, appetite change and fatigue.  HENT: Negative for hearing loss, congestion, sore throat, rhinorrhea, dental problem, sinus pressure and tinnitus.   Eyes: Negative for pain, discharge and visual disturbance.  Respiratory: Negative for cough and shortness of breath.   Cardiovascular: Negative for chest pain, palpitations and leg swelling.  Gastrointestinal: Negative for nausea, vomiting, abdominal pain, diarrhea, constipation, blood in stool and abdominal distention.  Genitourinary: Positive for urgency, frequency and flank pain. Negative for dysuria, hematuria, vaginal bleeding, vaginal discharge, difficulty urinating, vaginal pain and pelvic pain.  Musculoskeletal: Positive for back pain. Negative for  joint swelling, arthralgias and gait problem.  Skin: Negative for rash.  Neurological: Negative for dizziness, syncope, speech difficulty, weakness, numbness and headaches.  Hematological: Negative for adenopathy.  Psychiatric/Behavioral: Negative for behavioral problems, dysphoric mood and agitation. The patient is not nervous/anxious.        Objective:   Physical Exam  Constitutional: She is oriented to person, place, and time. She appears well-developed and well-nourished. No distress.       Afebrile  HENT:  Head: Normocephalic.  Right Ear: External ear normal.  Left Ear: External ear normal.  Mouth/Throat: Oropharynx is clear and moist.  Eyes:  Conjunctivae normal and EOM are normal. Pupils are equal, round, and reactive to light.  Neck: Normal range of motion. Neck supple. No thyromegaly present.       No cervical or generalized adenopathy  Cardiovascular: Normal rate, regular rhythm, normal heart sounds and intact distal pulses.        No tachycardia  Pulmonary/Chest: Effort normal and breath sounds normal.  Abdominal: Soft. Bowel sounds are normal. She exhibits no mass. There is no tenderness.       Hepatosplenomegaly with an extremely prominent spleen  Genitourinary:       Suggestion of mild right CVA tenderness  Musculoskeletal: Normal range of motion.  Lymphadenopathy:    She has no cervical adenopathy.  Neurological: She is alert and oriented to person, place, and time.  Skin: Skin is warm and dry. No rash noted.  Psychiatric: She has a normal mood and affect. Her behavior is normal.          Assessment & Plan:  Acute febrile illness with urinary frequency and urgency in a patient with known nephrolithiasis. Patient has not responded to antibiotic therapy. We'll discontinue Bactrim and substitute Cipro a urine C&S will be obtained;  polynephritis a possibility Hepatosplenomegaly. We'll check lab including a Monospot. Recheck on Monday ED evaluation if clinical worsening for CT abdominal scan to further evaluate

## 2012-06-03 NOTE — Patient Instructions (Signed)
Return in 3 days for followup  Cipro 500 mg twice daily  ED evaluation if any clinical worsening  Take [phone] mg of Tylenol every 6 hours as needed for pain relief or fever.  Avoid taking more than 3000 mg in a 24-hour period (  This may cause liver damage).

## 2012-06-04 LAB — CBC WITH DIFFERENTIAL/PLATELET
Basophils Absolute: 0 10*3/uL (ref 0.0–0.1)
Eosinophils Absolute: 0.1 10*3/uL (ref 0.0–0.7)
Eosinophils Relative: 1 % (ref 0–5)
Lymphocytes Relative: 18 % (ref 12–46)
MCH: 30.1 pg (ref 26.0–34.0)
MCV: 87.9 fL (ref 78.0–100.0)
Platelets: 277 10*3/uL (ref 150–400)
RDW: 12.7 % (ref 11.5–15.5)
WBC: 6.2 10*3/uL (ref 4.0–10.5)

## 2012-06-04 LAB — COMPREHENSIVE METABOLIC PANEL
ALT: 12 U/L (ref 0–35)
AST: 14 U/L (ref 0–37)
Creat: 0.86 mg/dL (ref 0.50–1.10)
Total Bilirubin: 0.7 mg/dL (ref 0.3–1.2)

## 2012-06-06 ENCOUNTER — Telehealth: Payer: Self-pay | Admitting: Family Medicine

## 2012-06-06 ENCOUNTER — Other Ambulatory Visit: Payer: Self-pay | Admitting: Family Medicine

## 2012-06-06 MED ORDER — NITROFURANTOIN MONOHYD MACRO 100 MG PO CAPS: 100.0000 mg | ORAL_CAPSULE | Freq: Two times a day (BID) | ORAL | Status: DC

## 2012-06-06 NOTE — Progress Notes (Signed)
Quick Note:  Attempt to call pt- VM on cell - LMTCB if questions - stop cipro - new rx [facility] - call if questions  Also spoke with mother ______

## 2012-06-06 NOTE — Telephone Encounter (Signed)
[facility] Triage Call Report Triage Record Num: [id number] Operator: Rebeca Allegra Patient Name: Krystn Dermody Call Date & Time: 06/03/2012 9:10:24PM Patient Phone: [phone] PCP: Eugenio Hoes. Todd Patient Gender: Female PCP Fax : [phone] Patient DOB: 25-Jan-1986 Practice Name: [facility]  Reason for Call:  Caller: Wanda/Mother; PCP: Kelle Darting [facility]); CB#: [phone]; Call regarding Vomiting; onset 05/31/12. Pt seen in the office 06/01/12 vomiting and started on Bactrim for a UTI. Pt seen again today, 06/03/12 for vomiting, blood work was completed and prescribed Cipro, d/c'd Batrim. Pt was given a Pherergan suppository 06/03/12 2000 with relief. 06/03/12 2117 99.5 orally. All emergent symptoms ruled out per Nausea or Vomiting protocol, home care advice given. Pt has an appt scheduled 06/07/12 Protocol(s) Used: Nausea or Vomiting

## 2012-06-07 ENCOUNTER — Ambulatory Visit: Payer: 59 | Admitting: Family Medicine

## 2012-06-07 LAB — URINE CULTURE: Colony Count: >=100000

## 2012-06-09 ENCOUNTER — Ambulatory Visit: Payer: 59 | Admitting: Family Medicine

## 2012-06-09 DIAGNOSIS — Z0289 Encounter for other administrative examinations: Secondary | ICD-10-CM

## 2012-06-21 ENCOUNTER — Ambulatory Visit: Payer: 59 | Admitting: Family Medicine

## 2012-06-27 ENCOUNTER — Ambulatory Visit (INDEPENDENT_AMBULATORY_CARE_PROVIDER_SITE_OTHER): Payer: 59 | Admitting: Family Medicine

## 2012-06-27 ENCOUNTER — Encounter: Payer: Self-pay | Admitting: Family Medicine

## 2012-06-27 VITALS — BP 110/80 | Wt 169.0 lb

## 2012-06-27 DIAGNOSIS — R1011 Right upper quadrant pain: Secondary | ICD-10-CM

## 2012-06-27 DIAGNOSIS — N390 Urinary tract infection, site not specified: Secondary | ICD-10-CM

## 2012-06-27 DIAGNOSIS — R52 Pain, unspecified: Secondary | ICD-10-CM

## 2012-06-27 MED ORDER — NITROFURANTOIN MONOHYD MACRO 100 MG PO CAPS: ORAL_CAPSULE | ORAL | Status: DC

## 2012-06-27 NOTE — Patient Instructions (Addendum)
We will set you up for an ultrasound of her gallbladder  In the meantime stay in a fat-free diet  Drink lots of water  Macrobid 1 daily  Call your insurance to find out what urologist he recover because we wanted consult ASAP

## 2012-06-27 NOTE — Progress Notes (Signed)
  Subjective:    Patient ID: Brandy [name], female    DOB: [birth year], 26 y.o.   MRN: [id number]  HPI Brandy [name] is a 26 year old single female nonsmoker who comes in today for evaluation of 2 problems  We have been following her over the last 3 weeks with a urinary tract infection. We initially gave her Septra however a culture grew out an Enterobacter. She was switched to [facility] and is asymptomatic. She has a history of recurrent kidney stones and has recurrent right flank pain. Her last scan by the urologist showed recurrent stones on the right side which correlates with her back pain.  She's also been having right upper quadrant pain for about 2 years. She states the past month or 2 it's getting worse and therefore she brought it up today. Now is occurring about 4 times a week. She states is sharp it comes and goes does not seem to be related to food. She does get nauseated no vomiting. Her mother and her sister both have had their gallbladders out.   Review of Systems    general urinary tract GI review of systems otherwise negative Objective:   Physical Exam Well-developed well-nourished female no acute distress examination the abdomen was negative spleen not enlarged       Assessment & Plan:  Urinary tract infection probably secondary to retained stone plan continue Macrobid Euro consult ASAP  Right upper quadrant pain,,,,,,,,,,, fat-free diet gallbladder ultrasound

## 2012-07-04 ENCOUNTER — Ambulatory Visit: Payer: Self-pay | Admitting: Family Medicine

## 2012-07-06 ENCOUNTER — Telehealth: Payer: Self-pay | Admitting: Family Medicine

## 2012-07-06 NOTE — Telephone Encounter (Signed)
Pt called req to get ultrasound results. Pls call.  °

## 2012-07-07 NOTE — Telephone Encounter (Signed)
Norton Pastel and she will get a copy of her results to fax [place]

## 2012-07-21 ENCOUNTER — Telehealth: Payer: Self-pay | Admitting: Family Medicine

## 2012-07-21 NOTE — Telephone Encounter (Signed)
Pt called and said that she has been unable to get ultrasound results. Was told by [facility] that nurse can call and get a verbal report or results can be printed and then faxed to Dr Tawanna Cooler.

## 2012-07-22 NOTE — Telephone Encounter (Signed)
[facility] for fax copy

## 2012-07-25 NOTE — Telephone Encounter (Signed)
Received copy 

## 2012-08-31 HISTORY — PX: CHOLECYSTECTOMY: SHX55

## 2012-11-16 ENCOUNTER — Other Ambulatory Visit: Payer: Self-pay | Admitting: Family Medicine

## 2012-11-28 ENCOUNTER — Emergency Department: Payer: Self-pay | Admitting: Emergency Medicine

## 2012-11-28 LAB — CBC
HCT: 41.2 % (ref 35.0–47.0)
HGB: 14.1 g/dL (ref 12.0–16.0)
Platelet: 261 10*3/uL (ref 150–440)
RBC: 4.6 10*6/uL (ref 3.80–5.20)
WBC: 8 10*3/uL (ref 3.6–11.0)

## 2012-11-28 LAB — COMPREHENSIVE METABOLIC PANEL
Albumin: 4.3 g/dL (ref 3.4–5.0)
Alkaline Phosphatase: 58 U/L (ref 50–136)
Anion Gap: 8 (ref 7–16)
BUN: 11 mg/dL (ref 7–18)
Bilirubin,Total: 0.4 mg/dL (ref 0.2–1.0)
Calcium, Total: 9.4 mg/dL (ref 8.5–10.1)
Creatinine: 0.83 mg/dL (ref 0.60–1.30)
EGFR (African American): >60
Glucose: 104 mg/dL — ABNORMAL HIGH (ref 65–99)
Osmolality: 275 (ref 275–301)
SGOT(AST): 13 U/L — ABNORMAL LOW (ref 15–37)
Sodium: 138 mmol/L (ref 136–145)

## 2012-11-28 LAB — URINALYSIS, COMPLETE
Glucose,UR: NEGATIVE mg/dL (ref 0–75)
Nitrite: NEGATIVE
Ph: 5 (ref 4.5–8.0)
RBC,UR: 8 /HPF (ref 0–5)
Specific Gravity: 1.019 (ref 1.003–1.030)
Squamous Epithelial: 6

## 2012-12-01 ENCOUNTER — Ambulatory Visit (INDEPENDENT_AMBULATORY_CARE_PROVIDER_SITE_OTHER): Payer: 59 | Admitting: General Surgery

## 2012-12-01 ENCOUNTER — Encounter: Payer: Self-pay | Admitting: General Surgery

## 2012-12-01 VITALS — BP 90/62 | HR 70 | Resp 12 | Ht 68.0 in | Wt 173.0 lb

## 2012-12-01 DIAGNOSIS — K819 Cholecystitis, unspecified: Secondary | ICD-10-CM

## 2012-12-01 NOTE — Progress Notes (Signed)
Patient ID: Brandy [name], female   DOB: 05-Jul-1986, 26 y.o.   MRN: [id number]  Chief Complaint  Patient presents with  . Abdominal Pain    HPI Brandy [name] is a 27 y.o. female.  Patient here today for abdominal pain that she has had for about 2 years on/off. Not really associated with certain foods. This weekend was the worst RUQ pain that radiates to shoulder, nausea no vomiting. Initially, diarrhea but not so much now. She did have an ultrasound completed. The patient reports pain beginning in the right upper quadrant with radiation to the tip of the scapula. This is associated with nausea when most severe. Diarrhea she experienced at the beginning of this present episode has resolved.  HPI  Past Medical History  Diagnosis Date  . Migraine   . Anxiety   . History of nephrolithiasis   . Depression   . Kidney stone     Past Surgical History  Procedure Laterality Date  . Kidney stone surgery  1997, 2004    Family History  Problem Relation Age of Onset  . Arthritis    . Diabetes    . Hypertension    . Lung cancer    . Colon cancer    . Breast cancer      Social History History  Substance Use Topics  . Smoking status: Never Smoker   . Smokeless tobacco: Never Used  . Alcohol Use: No    Allergies  Allergen Reactions  . Penicillins     REACTION: HIves    Current Outpatient Prescriptions  Medication Sig Dispense Refill  . LORazepam (ATIVAN) 0.5 MG tablet TAKE ONE TABLET BY MOUTH TWICE DAILY AS NEEDED  60 tablet  5  . ondansetron (ZOFRAN) 4 MG tablet Take 4 mg by mouth every 8 (eight) hours as needed for nausea.      Marland Kitchen oxyCODONE-acetaminophen (PERCOCET/ROXICET) 5-325 MG per tablet Take 1 tablet by mouth every 4 (four) hours as needed for pain.       No current facility-administered medications for this visit.    Review of Systems Review of Systems  Constitutional: Positive for chills.  Respiratory: Negative.   Cardiovascular: Negative.    Gastrointestinal: Positive for nausea, abdominal pain and abdominal distention.  Neurological: Positive for headaches.    Blood pressure 90/62, pulse 70, resp. rate 12, height 5\' 8"  (1.727 m), weight 173 lb (78.472 kg), last menstrual period 11/29/2012.  Physical Exam Physical Exam  Constitutional: She is oriented to person, place, and time. She appears well-developed and well-nourished.  Cardiovascular: Normal rate and regular rhythm.   Pulmonary/Chest: Effort normal and breath sounds normal.  Abdominal: Soft. Normal appearance. There is tenderness in the right upper quadrant.  Neurological: She is alert and oriented to person, place, and time.  Skin: Skin is dry.    Data Reviewed Abdominal ultrasound to fall 2013 and April 2014 completed at [facility] were reviewed. Increasing size and number of adherent nodules on the gallbladder wall consistent with polyps or adherent stones.  Laboratory studies completed to the emergency department earlier this week or unremarkable per  Assessment    Chronic cholecystitis, possible cholelithiasis versus polyps    Plan    Elective cholecystectomy.       [name] 12/01/2012, 9:07 AM

## 2012-12-01 NOTE — Patient Instructions (Addendum)
Laparoscopic Cholecystectomy with Intraoperative Cholangiogram. The procedure, including it's potential risks and complications (including but not limited to infection, bleeding, injury to intra-abdominal organs or bile ducts, bile leak, poor cosmetic result, sepsis and death) were discussed with the patient in detail. Non-operative options, including their inherent risks (acute calculous cholecystitis with possible choledocholithiasis or gallstone pancreatitis, with the risk of ascending cholangitis, sepsis, and death) were discussed as well. The patient expressed and understanding of what we discussed and wishes to proceed with laparoscopic cholecystectomy. The patient further understands that if it is technically not possible, or it is unsafe to proceed laparoscopically, that I will convert to an open cholecystectomy.  Patient's surgery has been scheduled for 12-02-12 at [facility].

## 2012-12-02 ENCOUNTER — Ambulatory Visit: Payer: Self-pay | Admitting: General Surgery

## 2012-12-02 DIAGNOSIS — K811 Chronic cholecystitis: Secondary | ICD-10-CM

## 2012-12-05 ENCOUNTER — Encounter: Payer: Self-pay | Admitting: General Surgery

## 2012-12-06 ENCOUNTER — Encounter: Payer: Self-pay | Admitting: General Surgery

## 2012-12-06 NOTE — Progress Notes (Signed)
Quick Note:  Chronic cholecystitis with cholesterolosis. No evidence of atypia or malignancy. ______

## 2012-12-07 LAB — PATHOLOGY REPORT

## 2012-12-13 ENCOUNTER — Encounter: Payer: Self-pay | Admitting: General Surgery

## 2012-12-13 ENCOUNTER — Ambulatory Visit (INDEPENDENT_AMBULATORY_CARE_PROVIDER_SITE_OTHER): Payer: 59 | Admitting: General Surgery

## 2012-12-13 VITALS — BP 100/60 | HR 78 | Resp 16 | Ht 69.0 in | Wt 174.0 lb

## 2012-12-13 DIAGNOSIS — K819 Cholecystitis, unspecified: Secondary | ICD-10-CM

## 2012-12-13 NOTE — Progress Notes (Signed)
Patient ID: Brandy [name], female   DOB: 07-12-1986, 27 y.o.   MRN: [id number]  Chief Complaint  Patient presents with  . Routine Post Op    gallbladder    HPI Brandy [name] is a 27 y.o. female here today for her post op gallbladder on 4 /4/14. HPI.  Past Medical History  Diagnosis Date  . Migraine   . Anxiety   . History of nephrolithiasis   . Depression   . Kidney stone     Past Surgical History  Procedure Laterality Date  . Kidney stone surgery  1997, 2004  . Cholecystectomy  2014    Family History  Problem Relation Age of Onset  . Arthritis    . Diabetes    . Hypertension    . Lung cancer    . Colon cancer    . Breast cancer      Social History History  Substance Use Topics  . Smoking status: Never Smoker   . Smokeless tobacco: Never Used  . Alcohol Use: No    Allergies  Allergen Reactions  . Penicillins     REACTION: HIves    Current Outpatient Prescriptions  Medication Sig Dispense Refill  . LORazepam (ATIVAN) 0.5 MG tablet TAKE ONE TABLET BY MOUTH TWICE DAILY AS NEEDED  60 tablet  5  . ondansetron (ZOFRAN) 4 MG tablet Take 4 mg by mouth every 8 (eight) hours as needed for nausea.      Marland Kitchen oxyCODONE-acetaminophen (PERCOCET/ROXICET) 5-325 MG per tablet Take 1 tablet by mouth every 4 (four) hours as needed for pain.       No current facility-administered medications for this visit.    Review of Systems Review of Systems  Constitutional: Negative.   Respiratory: Negative.   Cardiovascular: Negative.     Blood pressure 100/60, pulse 78, resp. rate 16, height 5\' 9"  (1.753 m), weight 174 lb (78.926 kg), last menstrual period 11/29/2012.  Physical Exam Physical Exam  Constitutional: She appears well-developed and well-nourished.  Cardiovascular: Normal rate, regular rhythm and normal heart sounds.   Pulmonary/Chest: Effort normal and breath sounds normal.  Abdominal: Soft. Bowel sounds are normal.    Data Reviewed Pathology showed  chronic cholecystitis and cholesterolosis.   Assessment    Doing well status post cholecystectomy.     Plan    The patient was released to full activities. Followup will be on an as needed basis.       Brandy [name] 12/14/2012, 7:28 AM

## 2012-12-13 NOTE — Patient Instructions (Signed)
Prn

## 2012-12-14 ENCOUNTER — Encounter: Payer: Self-pay | Admitting: General Surgery

## 2013-01-16 ENCOUNTER — Encounter (HOSPITAL_COMMUNITY): Payer: Self-pay | Admitting: *Deleted

## 2013-01-16 ENCOUNTER — Emergency Department (HOSPITAL_COMMUNITY)
Admission: EM | Admit: 2013-01-16 | Discharge: 2013-01-16 | Disposition: A | Discharge status: Home / Self Care | Payer: 59 | Attending: Emergency Medicine | Admitting: Emergency Medicine

## 2013-01-16 DIAGNOSIS — Z3202 Encounter for pregnancy test, result negative: Secondary | ICD-10-CM | POA: Insufficient documentation

## 2013-01-16 DIAGNOSIS — R42 Dizziness and giddiness: Secondary | ICD-10-CM | POA: Insufficient documentation

## 2013-01-16 DIAGNOSIS — Z79899 Other long term (current) drug therapy: Secondary | ICD-10-CM | POA: Insufficient documentation

## 2013-01-16 DIAGNOSIS — R110 Nausea: Secondary | ICD-10-CM | POA: Insufficient documentation

## 2013-01-16 DIAGNOSIS — F411 Generalized anxiety disorder: Secondary | ICD-10-CM | POA: Insufficient documentation

## 2013-01-16 DIAGNOSIS — Z8679 Personal history of other diseases of the circulatory system: Secondary | ICD-10-CM | POA: Insufficient documentation

## 2013-01-16 DIAGNOSIS — Z87442 Personal history of urinary calculi: Secondary | ICD-10-CM | POA: Insufficient documentation

## 2013-01-16 DIAGNOSIS — Z8659 Personal history of other mental and behavioral disorders: Secondary | ICD-10-CM | POA: Insufficient documentation

## 2013-01-16 DIAGNOSIS — Z880 Allergy status to penicillin: Secondary | ICD-10-CM | POA: Insufficient documentation

## 2013-01-16 DIAGNOSIS — R55 Syncope and collapse: Secondary | ICD-10-CM | POA: Insufficient documentation

## 2013-01-16 LAB — POCT I-STAT, CHEM 8
BUN: 15 mg/dL (ref 6–23)
Calcium, Ion: 1.27 mmol/L — ABNORMAL HIGH (ref 1.12–1.23)
Chloride: 109 mEq/L (ref 96–112)
Creatinine, Ser: 0.8 mg/dL (ref 0.50–1.10)
Glucose, Bld: 112 mg/dL — ABNORMAL HIGH (ref 70–99)
HCT: 39 % (ref 36.0–46.0)
Hemoglobin: 13.3 g/dL (ref 12.0–15.0)
Potassium: 4 mEq/L (ref 3.5–5.1)
Sodium: 141 mEq/L (ref 135–145)
TCO2: 22 mmol/L (ref 0–100)

## 2013-01-16 LAB — POCT PREGNANCY, URINE: Preg Test, Ur: NEGATIVE

## 2013-01-16 LAB — URINALYSIS, ROUTINE W REFLEX MICROSCOPIC
Bilirubin Urine: NEGATIVE
Glucose, UA: NEGATIVE mg/dL
Hgb urine dipstick: NEGATIVE
Ketones, ur: NEGATIVE mg/dL
Leukocytes, UA: NEGATIVE
Nitrite: NEGATIVE
Protein, ur: NEGATIVE mg/dL
Specific Gravity, Urine: 1.012 (ref 1.005–1.030)
Urobilinogen, UA: 0.2 mg/dL (ref 0.0–1.0)
pH: 5.5 (ref 5.0–8.0)

## 2013-01-16 MED ORDER — SODIUM CHLORIDE 0.9 % IV BOLUS (SEPSIS)
1000.0000 mL | Freq: Once | INTRAVENOUS | Status: AC
  Administered 2013-01-16: 1000 mL via INTRAVENOUS

## 2013-01-16 NOTE — ED Notes (Signed)
Per EMS- Pt reports waking up to go to the restroom. Pt had near syncopal episode. States eye were more dilated than normal. Pt had gallbladder (laproscopy) removed 1 month ago, pt reports mild pain on the right side. States she has had several near syncopal episodes in the last 6 months. Vitals Stable. HR 124, BP 118/90 Resp 18 O2 99 RA CBG 79. Alertx4, NAD.

## 2013-01-16 NOTE — ED Provider Notes (Signed)
History     CSN: [id number]  Arrival date & time 01/16/13  0139   First MD Initiated Contact with Patient 01/16/13 0210      Chief Complaint  Patient presents with  . Weakness    (Consider location/radiation/quality/duration/timing/severity/associated sxs/prior treatment) HPI Patient comes in tonight complaining that she was nauseated "did not feel well" prior to going to bed about 11:30. She got up during the night to go to the restroom and felt very lightheaded and had a near syncopal episode. Her mother was present with her and states that she became very pale she was going to pass out. She did not area that she states that she has had several episodes of this over the past 6 months. These usually happen when she stands up. She has not been assessed for this in any way. She does have a primary care Dr. She had her gallbladder out within the past few months. She denies that she is having problems with that. She denies any abdominal pain at this time. She denies that she has had fever, chills, UTI symptoms, headache, chest pain, dyspnea, or abnormal vaginal discharge. Past Medical History  Diagnosis Date  . Migraine   . Anxiety   . History of nephrolithiasis   . Depression   . Kidney stone     Past Surgical History  Procedure Laterality Date  . Kidney stone surgery  1997, 2004  . Cholecystectomy  2014    Family History  Problem Relation Age of Onset  . Arthritis    . Diabetes    . Hypertension    . Lung cancer    . Colon cancer    . Breast cancer      History  Substance Use Topics  . Smoking status: Never Smoker   . Smokeless tobacco: Never Used  . Alcohol Use: No    OB History   Grav Para Term Preterm Abortions TAB SAB Ect Mult Living   0              Obstetric Comments   Age first period 2      Review of Systems  All other systems reviewed and are negative.    Allergies  Penicillins  Home Medications   Current Outpatient Rx  Name  Route  Sig   Dispense  Refill  . LORazepam (ATIVAN) 0.5 MG tablet   Oral   Take 0.5 mg by mouth every 6 (six) hours as needed for anxiety.           BP 106/51  Pulse 99  Temp(Src) 99.4 F (37.4 C) (Oral)  Resp 18  SpO2 99%  LMP 12/22/2012  Physical Exam  Nursing note and vitals reviewed. Constitutional: She is oriented to person, place, and time. She appears well-developed and well-nourished.  HENT:  Head: Normocephalic and atraumatic.  Right Ear: External ear normal.  Left Ear: External ear normal.  Nose: Nose normal.  Mouth/Throat: Oropharynx is clear and moist.  Eyes: Conjunctivae and EOM are normal. Pupils are equal, round, and reactive to light.  Neck: Normal range of motion. Neck supple.  Cardiovascular: Normal rate, regular rhythm, normal heart sounds and intact distal pulses.   Pulmonary/Chest: Effort normal and breath sounds normal.  Abdominal: Soft. Bowel sounds are normal.  Musculoskeletal: Normal range of motion.  Neurological: She is alert and oriented to person, place, and time. She has normal reflexes.  Skin: Skin is warm and dry.  Psychiatric: She has a normal mood and affect. Her  behavior is normal. Judgment and thought content normal.    ED Course  Procedures (including critical care time)  Labs Reviewed  URINALYSIS, ROUTINE W REFLEX MICROSCOPIC - Abnormal; Notable for the following:    APPearance CLOUDY (*)    All other components within normal limits  POCT I-STAT, CHEM 8 - Abnormal; Notable for the following:    Glucose, Bld 112 (*)    Calcium, Ion 1.27 (*)    All other components within normal limits  POCT PREGNANCY, URINE   No results found.   No diagnosis found.  Patient had i-STAT done here no acute abnormalities noted although calcium was slightly elevated. Patient has a normal EKG. She is advised to followup with her primary care Dr. For thyroid screen and possibly referral to cardiology.  Date: 01/16/2013  Rate: 88  Rhythm: normal sinus rhythm   QRS Axis: normal  Intervals: normal  ST/T Wave abnormalities: normal  Conduction Disutrbances: none  Narrative Interpretation: unremarkable     MDM    Filed Vitals:   01/16/13 0228 01/16/13 0230 01/16/13 0315 01/16/13 0330  BP: 128/83 125/80 107/57 106/51  Pulse: 104 88 103 99  Temp:      TempSrc:      Resp:   19 18  SpO2:   99% 99%   Patient had orthostatic vital signs done and she is not orthostatic.      Hilario Quarry, MD 01/16/13 [phone]

## 2013-01-16 NOTE — ED Notes (Signed)
Pt states she felt "bad " at 2100 last pm went to bed, awoke at 2330 diaphoretic, nauseated, near syncopal.  EMS called.  On arrival pt is alert, oriented, states she has had at least 6 near syncopal episodes in the last month. Placed on cardiac monitor in ST rate 102 .  Denies CP but states she has had CP in the past. C/o nausea at present

## 2013-01-30 ENCOUNTER — Encounter: Payer: Self-pay | Admitting: Family Medicine

## 2013-01-30 ENCOUNTER — Ambulatory Visit (INDEPENDENT_AMBULATORY_CARE_PROVIDER_SITE_OTHER): Payer: 59 | Admitting: Family Medicine

## 2013-01-30 ENCOUNTER — Other Ambulatory Visit: Payer: Self-pay | Admitting: Family Medicine

## 2013-01-30 ENCOUNTER — Encounter: Payer: Self-pay | Admitting: *Deleted

## 2013-01-30 VITALS — BP 110/80 | Temp 98.4°F | Wt 173.0 lb

## 2013-01-30 DIAGNOSIS — F329 Major depressive disorder, single episode, unspecified: Secondary | ICD-10-CM

## 2013-01-30 DIAGNOSIS — F3289 Other specified depressive episodes: Secondary | ICD-10-CM

## 2013-01-30 LAB — TSH: TSH: 1.45 u[IU]/mL (ref 0.35–5.50)

## 2013-01-30 MED ORDER — LAMOTRIGINE 25 MG PO TABS: 25.0000 mg | ORAL_TABLET | Freq: Every day | ORAL | Status: DC

## 2013-01-30 NOTE — Patient Instructions (Signed)
Begin Lamictal 25 mg..........Marland Kitchen 1 tablet Monday Wednesday Friday for 2 weeks then one tablet daily in the morning  Continue the Ativan 1 or 2 tablets each bedtime  Avoid caffeine  Begin a walking program 30 minutes daily  Call Dr. Andee Poles and make an appointment to see her ASAP  Return to see me in 3 weeks for followup

## 2013-01-30 NOTE — Progress Notes (Signed)
  Subjective:    Patient ID: Brandy [name], female    DOB: Aug 06, 1986, 27 y.o.   MRN: [id number]  HPI Brandy [name] is a 27 year old single female nonsmoker who comes in today for evaluation of depression, question thyroid disease, and anxiety  She's concerned she may have thyroid difficulty. She works in Agricultural engineer medicine Department at [employer]  She recently went to the emergency room and melanotic because of episode of shortness of breath. Workup was negative including EKG.  She stopped her Prozac about 2 months ago at taper slowly. She says it was helping her. She now states that her depression is more of mood swings.  Her boss at work has mandated she go for counseling because of her anxiety and she doesn't seem to be normal at work.  LMP 525 to 5:30 normal birth control condoms when necessary  Family history positive for thyroid disease and depression also she's tried in the past Wellbutrin and Celexa and those medicines didn't he help either. I morning if she doesn't have bipolar depression  Review of Systems    review of systems otherwise negative Objective:   Physical Exam  Well-developed well nourished female no acute distress examination the neck is normal      Assessment & Plan:  Normal thyroid on physical exam check labs  Anxiety continue Ativan 0.5 each bedtime when necessary  Question bipolar depression,,,,,,,,, empirically start Lamictal consult with Dr. Nolen Mu ASAP

## 2013-01-31 ENCOUNTER — Telehealth: Payer: Self-pay | Admitting: Family Medicine

## 2013-01-31 NOTE — Telephone Encounter (Signed)
Pt would results of labs done 6/02.

## 2013-01-31 NOTE — Telephone Encounter (Signed)
Patient is aware of lab results.

## 2013-02-06 ENCOUNTER — Telehealth: Payer: Self-pay | Admitting: *Deleted

## 2013-02-06 NOTE — Telephone Encounter (Signed)
Patient would like to know if she can try a different prescription instead of Ativan.  Maybe prozac?

## 2013-02-07 MED ORDER — FLUOXETINE HCL 10 MG PO TABS: 10.0000 mg | ORAL_TABLET | Freq: Every day | ORAL | Status: DC

## 2013-02-07 NOTE — Telephone Encounter (Signed)
rx sent to pharmacy and patient is aware. 

## 2013-02-07 NOTE — Telephone Encounter (Signed)
Prozac 10 mg dispense 100 ,,,,,,,,,,, directions 1 dailyalso followup with [place] in one month here in the office to see she's doing

## 2013-02-21 ENCOUNTER — Ambulatory Visit: Payer: 59 | Admitting: Family Medicine

## 2013-05-05 ENCOUNTER — Ambulatory Visit: Payer: Self-pay

## 2013-08-10 ENCOUNTER — Telehealth: Payer: Self-pay | Admitting: Family Medicine

## 2013-08-10 NOTE — Telephone Encounter (Signed)
Pt has right ear ache and poss sinus inf. Pt has no insurance at the present. Pls advise. Pharm: [facility]/ [address]

## 2013-08-10 NOTE — Telephone Encounter (Signed)
Tried to call patient but unable to leave message on voicemail

## 2013-08-11 NOTE — Telephone Encounter (Signed)
Spoke with patient and she is feeling better today

## 2013-08-11 NOTE — Telephone Encounter (Signed)
[facility] Triage Call Report Triage Record Num: [id number] Operator: Caswell Corwin Patient Name: Brandy [name] Call Date & Time: 08/10/2013 6:20:55PM Patient Phone: [phone] PCP: Eugenio Hoes. Todd Patient Gender: Female PCP Fax : [phone] Patient DOB: July 25, 1986 [facility] Name: [facility] Reason for Call: Caller: Brandy [name]/Patient; PCP: Kelle Darting [facility]); CB#: [phone]; Call regarding Ear pain, sinus pain, cough. SX started 08/09/13. Worse SX is her ears and a headache and she is at work right now. She has taken Mucinex, but nothing for her H/A. Pt had a nose bleed this AM. Triaged Upper Respiratory Infection and all emergent SX R/O. Disposition is to be seen in 24 hrs, but pt states she has no insurance. Home care and call back instrucions given. Protocol(s) Used: Upper Respiratory Infection (URI) Recommended Outcome per Protocol: See Provider within 24 hours Reason for Outcome: Pressure/pain above or below eyes, near ears over sinuses (may also be described as fullness, worsens when bending forward, teeth or eye pain) AND yellow-green drainage from nose or down back of throat. Care Advice: ~ Use a cool mist humidifier to moisten air. Be sure to clean according to manufacturer's instructions. ~ See provider in 8 hours if redness, swelling and tenderness develops above or below eyes/near ears/over sinuses ~ Consider use of a saline nasal spray per package directions to help relieve nasal congestion. Drink more fluids -- water, low-sugar juices, tea and warm soup, especially chicken broth, are options. Avoid caffeinated or alcoholic beverages because they can increase the chance of dehydration. ~ A warm, moist compress placed on face, over eyes for 15 to 20 minutes, 5 to 6 times a day, may help relieve the congestion. ~ Consider nonprescription decongestant (Sudafed, Drixoral) for relief of symptoms after checking with a provider, especially if there  is a history of hypertension, hyperthyroidism, heart disease, diabetes, glaucoma, urinary retention caused by prostatic hypertrophy. ~ Analgesic/Antipyretic Advice - NSAIDs: Consider aspirin, ibuprofen, naproxen or ketoprofen for pain or fever as directed on label or by pharmacist/provider. PRECAUTIONS: - If over 28 years of age, should not take longer than 1 week without consulting provider. EXCEPTIONS: - Should not be used if taking blood thinners or have bleeding problems. - Do not use if have history of sensitivity/allergy to any of these medications; or history of cardiovascular, ulcer, kidney, liver disease or diabetes unless approved by provider. - Do not exceed recommended dose or frequency. ~ Nasal Irrigation To Relieve Congestion: - Wash hands with soap and water. - Add 1/2 teaspoon salt to 1 cup warm water to make saltwater solution. - Use a bulb syringe to draw up the saltwater solution. Turn the bulb upright to squeeze out any air. Fill the syringe until is full. - Bend over sink bowl and lean slightly toward sink. Gently insert the tip of the syringe into nostril about 1/2 inch. Point tip toward outer corner of eye and GENTLY squeeze bulb to squirt saltwater into nostril. Forceful irrigation to clear sinuses requires the use of sterile water or saline. ~ 12/

## 2013-08-17 ENCOUNTER — Encounter (HOSPITAL_COMMUNITY): Payer: Self-pay | Admitting: Emergency Medicine

## 2013-08-17 ENCOUNTER — Emergency Department (HOSPITAL_COMMUNITY)
Admission: EM | Admit: 2013-08-17 | Discharge: 2013-08-17 | Disposition: A | Discharge status: Home / Self Care | Payer: 59 | Attending: Emergency Medicine | Admitting: Emergency Medicine

## 2013-08-17 DIAGNOSIS — R197 Diarrhea, unspecified: Secondary | ICD-10-CM | POA: Insufficient documentation

## 2013-08-17 DIAGNOSIS — J069 Acute upper respiratory infection, unspecified: Secondary | ICD-10-CM | POA: Insufficient documentation

## 2013-08-17 DIAGNOSIS — F411 Generalized anxiety disorder: Secondary | ICD-10-CM | POA: Insufficient documentation

## 2013-08-17 DIAGNOSIS — Z87442 Personal history of urinary calculi: Secondary | ICD-10-CM | POA: Insufficient documentation

## 2013-08-17 DIAGNOSIS — G43909 Migraine, unspecified, not intractable, without status migrainosus: Secondary | ICD-10-CM | POA: Insufficient documentation

## 2013-08-17 DIAGNOSIS — Z79899 Other long term (current) drug therapy: Secondary | ICD-10-CM | POA: Insufficient documentation

## 2013-08-17 DIAGNOSIS — Z3202 Encounter for pregnancy test, result negative: Secondary | ICD-10-CM | POA: Insufficient documentation

## 2013-08-17 DIAGNOSIS — F3289 Other specified depressive episodes: Secondary | ICD-10-CM | POA: Insufficient documentation

## 2013-08-17 DIAGNOSIS — F329 Major depressive disorder, single episode, unspecified: Secondary | ICD-10-CM | POA: Insufficient documentation

## 2013-08-17 DIAGNOSIS — R5381 Other malaise: Secondary | ICD-10-CM | POA: Insufficient documentation

## 2013-08-17 DIAGNOSIS — R110 Nausea: Secondary | ICD-10-CM | POA: Insufficient documentation

## 2013-08-17 DIAGNOSIS — H9209 Otalgia, unspecified ear: Secondary | ICD-10-CM | POA: Insufficient documentation

## 2013-08-17 DIAGNOSIS — Z880 Allergy status to penicillin: Secondary | ICD-10-CM | POA: Insufficient documentation

## 2013-08-17 DIAGNOSIS — R42 Dizziness and giddiness: Secondary | ICD-10-CM | POA: Insufficient documentation

## 2013-08-17 DIAGNOSIS — R5383 Other fatigue: Secondary | ICD-10-CM | POA: Insufficient documentation

## 2013-08-17 LAB — POCT I-STAT, CHEM 8
BUN: 10 mg/dL (ref 6–23)
Creatinine, Ser: 0.9 mg/dL (ref 0.50–1.10)
Glucose, Bld: 111 mg/dL — ABNORMAL HIGH (ref 70–99)
Sodium: 141 mEq/L (ref 135–145)
TCO2: 26 mmol/L (ref 0–100)

## 2013-08-17 LAB — URINALYSIS, ROUTINE W REFLEX MICROSCOPIC
Bilirubin Urine: NEGATIVE
Hgb urine dipstick: NEGATIVE
Ketones, ur: NEGATIVE mg/dL
Specific Gravity, Urine: 1.008 (ref 1.005–1.030)
Urobilinogen, UA: 0.2 mg/dL (ref 0.0–1.0)

## 2013-08-17 MED ORDER — LORAZEPAM 2 MG/ML IJ SOLN
0.5000 mg | Freq: Once | INTRAMUSCULAR | Status: AC
  Administered 2013-08-17: 0.5 mg via INTRAVENOUS
  Filled 2013-08-17: qty 1

## 2013-08-17 MED ORDER — ONDANSETRON 4 MG PO TBDP: 4.0000 mg | ORAL_TABLET | Freq: Three times a day (TID) | ORAL | Status: DC | PRN

## 2013-08-17 MED ORDER — SODIUM CHLORIDE 0.9 % IV BOLUS (SEPSIS)
1000.0000 mL | Freq: Once | INTRAVENOUS | Status: AC
  Administered 2013-08-17: 1000 mL via INTRAVENOUS

## 2013-08-17 MED ORDER — ONDANSETRON 4 MG PO TBDP
8.0000 mg | ORAL_TABLET | Freq: Once | ORAL | Status: AC
  Administered 2013-08-17: 8 mg via ORAL
  Filled 2013-08-17: qty 2

## 2013-08-17 NOTE — ED Notes (Signed)
PT states over last few days she has had body aches, weakness, nausea

## 2013-08-17 NOTE — ED Provider Notes (Signed)
CSN: [id number]     Arrival date & time 08/17/13  0418 History   First MD Initiated Contact with Patient 08/17/13 [phone]     Chief Complaint  Patient presents with  . Generalized Body Aches   (Consider location/radiation/quality/duration/timing/severity/associated sxs/prior Treatment) HPI Comments: Patient with h/o postural orthostatic tachycardia syndrome, anxiety -- presents with 5 day history of URI symptoms including nasal congestion, ear pain, nausea. Patient was treating at home with Sudafed. Last night she began to feel worse. Patient states that she began feeling dizzy and lightheaded. She did not have full syncope at any time. Patient states that she has a spinning sensation with movement of her head and sitting up. She has not had significant chest pain or shortness of breath. She denies significant abdominal pain however has had episodes of diarrhea over the past 2 days. She denies urine changes, vaginal bleeding or discharge. No skin rashes. No other medications prior to arrival. Chills x 1 day. The onset of this condition was acute. The course is constant. Aggravating factors: none. Alleviating factors: none.    The history is provided by the patient and medical records.    Past Medical History  Diagnosis Date  . Migraine   . Anxiety   . History of nephrolithiasis   . Depression   . Kidney stone    Past Surgical History  Procedure Laterality Date  . Kidney stone surgery  1997, 2004  . Cholecystectomy  2014   Family History  Problem Relation Age of Onset  . Arthritis    . Diabetes    . Hypertension    . Lung cancer    . Colon cancer    . Breast cancer     History  Substance Use Topics  . Smoking status: Never Smoker   . Smokeless tobacco: Never Used  . Alcohol Use: No   OB History   Grav Para Term Preterm Abortions TAB SAB Ect Mult Living   0              Obstetric Comments   Age first period 41     Review of Systems  Constitutional: Positive for chills  and fatigue. Negative for fever.  HENT: Positive for congestion, ear pain, rhinorrhea and sinus pressure. Negative for sore throat.   Eyes: Negative for redness.  Respiratory: Negative for cough.   Cardiovascular: Negative for chest pain.  Gastrointestinal: Positive for diarrhea. Negative for nausea, vomiting, abdominal pain and blood in stool.  Genitourinary: Negative for dysuria, vaginal bleeding and vaginal discharge.  Musculoskeletal: Positive for myalgias.  Skin: Negative for rash.  Neurological: Negative for headaches.  Psychiatric/Behavioral: The patient is not nervous/anxious.     Allergies  Penicillins  Home Medications   Current Outpatient Rx  Name  Route  Sig  Dispense  Refill  . FLUoxetine (PROZAC) 10 MG tablet   Oral   Take 1 tablet (10 mg total) by mouth daily.   100 tablet   3   . lamoTRIgine (LAMICTAL) 25 MG tablet   Oral   Take 1 tablet (25 mg total) by mouth daily.   60 tablet   1    BP 141/79  Pulse 94  Temp(Src) 98.2 F (36.8 C) (Oral)  Resp 16  SpO2 100%  LMP 08/03/2013 Physical Exam  Nursing note and vitals reviewed. Constitutional: She is oriented to person, place, and time. She appears well-developed and well-nourished.  HENT:  Head: Normocephalic and atraumatic.  Right Ear: Tympanic membrane, external ear and ear  canal normal.  Left Ear: Tympanic membrane, external ear and ear canal normal.  Nose: Mucosal edema and rhinorrhea present.  Mouth/Throat: Uvula is midline, oropharynx is clear and moist and mucous membranes are normal. Mucous membranes are not dry. No oral lesions. No trismus in the jaw. No uvula swelling. No oropharyngeal exudate, posterior oropharyngeal edema, posterior oropharyngeal erythema or tonsillar abscesses.  Eyes: Conjunctivae are normal. Pupils are equal, round, and reactive to light. Right eye exhibits no discharge. Left eye exhibits no discharge.  Mild dilation of pupils.   Neck: Normal range of motion. Neck supple.   Cardiovascular: Normal rate, regular rhythm and normal heart sounds.   Pulmonary/Chest: Effort normal and breath sounds normal. No respiratory distress. She has no wheezes. She has no rales.  Abdominal: Soft. There is no tenderness.  Lymphadenopathy:    She has no cervical adenopathy.  Neurological: She is alert and oriented to person, place, and time. No cranial nerve deficit or sensory deficit. She exhibits normal muscle tone. Coordination normal. GCS eye subscore is 4. GCS verbal subscore is 5. GCS motor subscore is 6.  Skin: Skin is warm and dry.  Psychiatric: Her mood appears anxious.    ED Course  Procedures (including critical care time) Labs Review Labs Reviewed  POCT I-STAT, CHEM 8 - Abnormal; Notable for the following:    Glucose, Bld 111 (*)    Calcium, Ion 1.24 (*)    All other components within normal limits  URINALYSIS, ROUTINE W REFLEX MICROSCOPIC  POCT PREGNANCY, URINE   Imaging Review No results found.  EKG Interpretation   None      6:33 AM Patient seen and examined. Work-up initiated. Medications ordered. Pt well-appearing.   Vital signs reviewed and are as follows: Filed Vitals:   08/17/13 0424  BP: 141/79  Pulse: 94  Temp: 98.2 F (36.8 C)  Resp: 16   Patient checked on and 2nd liter of fluids ordered. Informed of reassuring results.   8:56 AM Fluids complete. Pt states she is feeling somewhat better. Will d/c to home with zofran. She will f/u with PCP re: mild high calcium. Patient urged to return with worsening symptoms or other concerns. Patient verbalized understanding and agrees with plan.     MDM   1. Upper respiratory tract infection   2. Lightheadedness    Well appearing female -- treated with fluids in ED. EKG unconcerning. UPT neg. She does get lightheaded and tachy with standing. Fluids given. No CP or SOB to suggest PE. Treat URI sx symptomatically.     Renne Crigler, PA-C 08/17/13 [phone]

## 2013-08-17 NOTE — ED Notes (Signed)
No neuro deficits noted.

## 2013-08-17 NOTE — ED Provider Notes (Signed)
Date: 08/17/2013  Rate: 76  Rhythm: normal sinus rhythm  QRS Axis: normal  Intervals: normal  ST/T Wave abnormalities: nonspecific ST changes  Conduction Disutrbances:none  Narrative Interpretation: very poor quality     Joya Gaskins, MD 08/17/13 [phone]

## 2013-08-18 NOTE — ED Provider Notes (Signed)
Medical screening examination/treatment/procedure(s) were performed by non-physician practitioner and as supervising physician I was immediately available for consultation/collaboration.  EKG Interpretation   None         Tonetta Napoles, MD 08/18/13 0009 

## 2013-12-28 ENCOUNTER — Encounter: Payer: Self-pay | Admitting: Family Medicine

## 2013-12-28 ENCOUNTER — Ambulatory Visit (INDEPENDENT_AMBULATORY_CARE_PROVIDER_SITE_OTHER): Payer: 59 | Admitting: Family Medicine

## 2013-12-28 VITALS — BP 110/80 | Temp 98.3°F | Wt 177.0 lb

## 2013-12-28 DIAGNOSIS — R112 Nausea with vomiting, unspecified: Secondary | ICD-10-CM | POA: Insufficient documentation

## 2013-12-28 DIAGNOSIS — R197 Diarrhea, unspecified: Secondary | ICD-10-CM

## 2013-12-28 DIAGNOSIS — F411 Generalized anxiety disorder: Secondary | ICD-10-CM

## 2013-12-28 HISTORY — DX: Hypercalcemia: E83.52

## 2013-12-28 HISTORY — DX: Diarrhea, unspecified: R19.7

## 2013-12-28 HISTORY — DX: Nausea with vomiting, unspecified: R11.2

## 2013-12-28 LAB — CBC WITH DIFFERENTIAL/PLATELET
BASOS ABS: 0 10*3/uL (ref 0.0–0.1)
Basophils Relative: 0.4 % (ref 0.0–3.0)
EOS PCT: 0.7 % (ref 0.0–5.0)
Eosinophils Absolute: 0.1 10*3/uL (ref 0.0–0.7)
HEMATOCRIT: 40.6 % (ref 36.0–46.0)
Hemoglobin: 13.7 g/dL (ref 12.0–15.0)
LYMPHS ABS: 1.7 10*3/uL (ref 0.7–4.0)
LYMPHS PCT: 20.1 % (ref 12.0–46.0)
MCHC: 33.8 g/dL (ref 30.0–36.0)
MCV: 91.4 fl (ref 78.0–100.0)
MONOS PCT: 8.5 % (ref 3.0–12.0)
Monocytes Absolute: 0.7 10*3/uL (ref 0.1–1.0)
Neutro Abs: 5.9 10*3/uL (ref 1.4–7.7)
Neutrophils Relative %: 70.3 % (ref 43.0–77.0)
PLATELETS: 328 10*3/uL (ref 150.0–400.0)
RBC: 4.45 Mil/uL (ref 3.87–5.11)
RDW: 12.8 % (ref 11.5–14.6)
WBC: 8.4 10*3/uL (ref 4.5–10.5)

## 2013-12-28 LAB — HEPATIC FUNCTION PANEL
ALK PHOS: 46 U/L (ref 39–117)
ALT: 17 U/L (ref 0–35)
AST: 16 U/L (ref 0–37)
Albumin: 4.4 g/dL (ref 3.5–5.2)
BILIRUBIN DIRECT: 0 mg/dL (ref 0.0–0.3)
BILIRUBIN TOTAL: 0.5 mg/dL (ref 0.3–1.2)
Total Protein: 7.6 g/dL (ref 6.0–8.3)

## 2013-12-28 LAB — BASIC METABOLIC PANEL
BUN: 14 mg/dL (ref 6–23)
CHLORIDE: 103 meq/L (ref 96–112)
CO2: 29 mEq/L (ref 19–32)
Calcium: 9.8 mg/dL (ref 8.4–10.5)
Creatinine, Ser: 0.9 mg/dL (ref 0.4–1.2)
GFR: 84.24 mL/min (ref >60.00)
Glucose, Bld: 95 mg/dL (ref 70–99)
POTASSIUM: 3.8 meq/L (ref 3.5–5.1)
Sodium: 140 mEq/L (ref 135–145)

## 2013-12-28 LAB — AMYLASE: AMYLASE: 34 U/L (ref 27–131)

## 2013-12-28 LAB — CALCIUM: CALCIUM: 9.8 mg/dL (ref 8.4–10.5)

## 2013-12-28 LAB — LIPASE: LIPASE: 28 U/L (ref 11.0–59.0)

## 2013-12-28 MED ORDER — FLUOXETINE HCL 20 MG PO TABS: 20.0000 mg | ORAL_TABLET | Freq: Every day | ORAL | Status: DC

## 2013-12-28 MED ORDER — CLONAZEPAM 1 MG PO TABS: ORAL_TABLET | ORAL | Status: DC

## 2013-12-28 NOTE — Progress Notes (Signed)
   Subjective:    Patient ID: Brandy [name], female    DOB: 1985-09-28, 28 y.o.   MRN: [id number]  HPI Brandy [name] is a 28 year old single female nonsmoker who comes in today for evaluation of 5 days history of nausea vomiting and diarrhea  She states earlier in the week she has some constipation now she has diarrhea. She's had episode of vomiting 3-5 per day. She's had no symptoms consistent with a viral infection. No fever no chills  She had her gallbladder removed a year ago for chronic cholecystitis. She doesn't recall being told he had to explore her common duct.  She also has a symptoms of achiness fatigue. She says she was emerge from in December was told she had an elevated serum calcium level. The level high normals 1.23 her level is 1.24 which is probably not clinically significant however I would repeated to rule out parathyroid disease.  LMP [id number] normal for birth control she uses condoms.  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 She has no health insurance. She has a history of panic attacks and would like to restart her Prozac and Klonopin that she had been on in the past.   Review of Systems    GI review of systems otherwise negative Objective:   Physical Exam Well-developed well-nourished female no acute distress vital signs stable she is afebrile examination the abdomen the abdomen is soft the bowel sounds are normal liver spleen kidneys not enlarged no palpable masses no tenderness no rebound       Assessment & Plan:  Five-day history of nausea vomiting diarrhea with intermittent constipation........Marland Kitchen etiology unknown begin workup  History of panic attacks restart medication

## 2013-12-28 NOTE — Patient Instructions (Signed)
Labs today  Clear liquid diet  I will call you tomorrow he gets her lab work back  Zofran 4 mg every 4-6 hours when necessary for nausea

## 2013-12-28 NOTE — Progress Notes (Signed)
Pre visit review using our clinic review tool, if applicable. No additional management support is needed unless otherwise documented below in the visit note. 

## 2014-04-09 ENCOUNTER — Telehealth: Payer: Self-pay | Admitting: Family Medicine

## 2014-04-09 MED ORDER — ETHYNODIOL DIAC-ETH ESTRADIOL 1-50 MG-MCG PO TABS: 1.0000 | ORAL_TABLET | Freq: Every day | ORAL | Status: DC

## 2014-04-09 NOTE — Telephone Encounter (Signed)
[id number] [place] [address] [place] - [place] is requesting re-fill on ZOVIA 1/50E, 28, 1-50 MG-MCG tablet

## 2014-06-23 ENCOUNTER — Other Ambulatory Visit: Payer: Self-pay | Admitting: Family Medicine

## 2014-06-25 ENCOUNTER — Other Ambulatory Visit: Payer: Self-pay | Admitting: Family Medicine

## 2014-07-02 ENCOUNTER — Encounter: Payer: Self-pay | Admitting: Family Medicine

## 2014-08-14 ENCOUNTER — Ambulatory Visit (INDEPENDENT_AMBULATORY_CARE_PROVIDER_SITE_OTHER): Payer: BC Managed Care – PPO | Admitting: Family Medicine

## 2014-08-14 ENCOUNTER — Encounter: Payer: Self-pay | Admitting: Family Medicine

## 2014-08-14 VITALS — BP 110/80 | Temp 98.7°F | Wt 193.0 lb

## 2014-08-14 DIAGNOSIS — M6588 Other synovitis and tenosynovitis, other site: Secondary | ICD-10-CM

## 2014-08-14 DIAGNOSIS — F329 Major depressive disorder, single episode, unspecified: Secondary | ICD-10-CM

## 2014-08-14 DIAGNOSIS — L0501 Pilonidal cyst with abscess: Secondary | ICD-10-CM

## 2014-08-14 DIAGNOSIS — M7751 Other enthesopathy of right foot: Secondary | ICD-10-CM | POA: Insufficient documentation

## 2014-08-14 DIAGNOSIS — F32A Depression, unspecified: Secondary | ICD-10-CM

## 2014-08-14 HISTORY — DX: Other enthesopathy of right foot and ankle: M77.51

## 2014-08-14 MED ORDER — DOXYCYCLINE HYCLATE 100 MG PO TABS: 100.0000 mg | ORAL_TABLET | Freq: Two times a day (BID) | ORAL | Status: DC

## 2014-08-14 NOTE — Progress Notes (Signed)
Pre visit review using our clinic review tool, if applicable. No additional management support is needed unless otherwise documented below in the visit note. 

## 2014-08-14 NOTE — Progress Notes (Signed)
   Subjective:    Patient ID: Brandy [name], female    DOB: 20-Jun-1986, 28 y.o.   MRN: [id number]  HPI Brandy [name] is a 28 year old single female who comes in today for evaluation of 3 actually for problems  She says we drained upon on her cyst on her couple years ago and it's been not problematic until couple days ago when she noticed some tenderness and yesterday all the fluid drained out. This is her third episode.  She noticed some soreness in her right foot at the base of her fifth metatarsal about Thanksgiving. Then on Thanksgiving Day she fell down the stairs at home. Did not hear any popping noises. Able to walk with a limp though.  She is on Prozac and Klonopin D of Dr. Caprice [name] however she says she is not doing well. I referred her back to Dr. Caprice [name]  When I first saw her when she was about 19 she weighed 120 pounds she is now up to 193 pounds   Review of Systems    review of systems otherwise negative Objective:   Physical Exam  Well-developed well-nourished female no acute distress vital signs stable she's afebrile examination of the coccyx area shows a small pin point cyst with some erythema and no pus  Examination right foot showed full range of motion normal pulses normal skin some tenderness around the fifth metatarsal      Assessment & Plan:  Tylenol cyst spontaneously drained with some residual inflammation........ short course of Keflex.... General surgery consult if it recurs  Tendinitis right foot.........Marland Kitchen Motrin 600 mg twice daily with food elevation and ice when necessary  Depression........Marland Kitchen return to see her psychiatrist

## 2014-08-14 NOTE — Patient Instructions (Addendum)
Doxycycline........ one twice daily for 10 days Hot soaks in a tub bath nightly  If it recurs I would recommend you see a general surgeon to have it completely excised next time  Motrin 400 mg twice daily with food  Elevation and ice 1520 minutes right foot at bedtime when necessary  I will call Dr. Arvil Persons office and make an appointment to see her one of her associates for follow-up

## 2014-09-02 ENCOUNTER — Emergency Department (INDEPENDENT_AMBULATORY_CARE_PROVIDER_SITE_OTHER)
Admission: EM | Admit: 2014-09-02 | Discharge: 2014-09-02 | Disposition: A | Discharge status: Nursing Home (Includes ICF) | Payer: BC Managed Care – PPO | Source: Home / Self Care | Attending: Family Medicine | Admitting: Family Medicine

## 2014-09-02 DIAGNOSIS — R1110 Vomiting, unspecified: Secondary | ICD-10-CM

## 2014-09-02 DIAGNOSIS — R197 Diarrhea, unspecified: Secondary | ICD-10-CM

## 2014-09-02 LAB — POCT URINALYSIS DIP (MANUAL ENTRY)
GLUCOSE UA: NEGATIVE
Leukocytes, UA: NEGATIVE
Nitrite, UA: NEGATIVE
PH UA: 6.5 (ref 5–8)
UROBILINOGEN UA: 0.2 (ref 0–1)

## 2014-09-02 LAB — POCT URINE PREGNANCY: Preg Test, Ur: NEGATIVE

## 2014-09-02 NOTE — Discharge Instructions (Signed)

## 2014-09-02 NOTE — ED Notes (Signed)
Patient complains of nausea, vomit, diarrhea x 2 days , states she has used Zofran and phenergan suppositories and is still vomiting

## 2014-09-02 NOTE — ED Provider Notes (Signed)
CSN: [id number]     Arrival date & time 09/02/14  44 History   First MD Initiated Contact with Patient 09/02/14 1740     Chief Complaint  Patient presents with  . Nausea  . Emesis     HPI Comments: Patient awoke yesterday and vomited about 7 times.  She later developed watery diarrhea.  She has had persistent chills/sweats and fatigue.  She has crampy abdominal discomfort.  She has had persistent nausea/vomiting and watery diarrhea despite Zofran 4mg  and Phenergan rectal suppository 25mg .  Patient is a 29 y.o. female presenting with vomiting. The history is provided by the patient and the spouse.  Emesis Severity:  Moderate Duration:  1 day Timing:  Intermittent Number of daily episodes:  7 Quality:  Stomach contents Progression:  Unchanged Chronicity:  New Recent urination:  Decreased Relieved by:  Nothing Worsened by:  Food smell Ineffective treatments: Zofran and Phenergan. Associated symptoms: chills, diarrhea and myalgias   Associated symptoms: no abdominal pain, no arthralgias, no cough, no fever, no headaches, no sore throat and no URI   Diarrhea:    Quality:  Watery   Number of occurrences:  5   Severity:  Moderate   Duration:  1 day   Timing:  Intermittent   Progression:  Unchanged Risk factors: not pregnant now, no sick contacts, no suspect food intake and no travel to endemic areas     Past Medical History  Diagnosis Date  . Migraine   . Anxiety   . History of nephrolithiasis   . Depression   . Kidney stone    Past Surgical History  Procedure Laterality Date  . Kidney stone surgery  1997, 2004  . Cholecystectomy  2014   Family History  Problem Relation Age of Onset  . Arthritis    . Diabetes    . Hypertension    . Lung cancer    . Colon cancer    . Breast cancer     History  Substance Use Topics  . Smoking status: Never Smoker   . Smokeless tobacco: Never Used  . Alcohol Use: No   OB History    Gravida Para Term Preterm AB TAB SAB Ectopic  Multiple Living   0               Obstetric Comments   Age first period 11     Review of Systems  Constitutional: Positive for chills.  HENT: Negative for sore throat.   Gastrointestinal: Positive for vomiting and diarrhea. Negative for abdominal pain.  Musculoskeletal: Positive for myalgias. Negative for arthralgias.  Neurological: Negative for headaches.  All other systems reviewed and are negative.   Allergies  Penicillins  Home Medications   Prior to Admission medications   Medication Sig Start Date End Date Taking? Authorizing Provider  clonazePAM (KLONOPIN) 1 MG tablet TAKE ONE TABLET BY MOUTH AS NEEDED FOR PANIC ATTACK 06/25/14   Dorena Cookey, MD  doxycycline (VIBRA-TABS) 100 MG tablet Take 1 tablet (100 mg total) by mouth 2 (two) times daily. 08/14/14   Dorena Cookey, MD  FLUoxetine (PROZAC) 20 MG tablet Take 1 tablet (20 mg total) by mouth daily. 12/28/13   Dorena Cookey, MD   BP 116/77 mmHg  Pulse 97  Temp(Src) 98.3 F (36.8 C) (Oral)  Ht 5' 8.5" (1.74 m)  Wt 186 lb (84.369 kg)  BMI 27.87 kg/m2  SpO2 100% Physical Exam Nursing notes and Vital Signs reviewed. Appearance:  Patient appears healthy and stated  age.  She is in acute distress, but appears uncomfortable.  She is alert and oriented.  Eyes:  Pupils are equal, round, and reactive to light and accomodation.  Extraocular movement is intact.  Conjunctivae are not inflamed  Ears:  Canals normal.  Tympanic membranes normal.  Nose:   Normal turbinates.  No sinus tenderness.   Pharynx:  Decreased moisture, otherwise ormal Neck:  Supple.  No adenopathy Lungs:  Clear to auscultation.  Breath sounds are equal.  Heart:  Regular rate and rhythm without murmurs, rubs, or gallops.  Abdomen:  Epigastric tenderness present without masses or hepatosplenomegaly.  Bowel sounds are decreased.  No CVA or flank tenderness.  Extremities:  No edema.  No calf tenderness Skin:  No rash present.   ED Course  Procedures   None   Labs Reviewed  POCT URINALYSIS DIP (MANUAL ENTRY):  BIL small; KET >=160mg /dL; SG >= 1.030; BLO small: PRO trace; URO 0.2 E.U./dL  POCT URINE PREGNANCY :  Negative         MDM   1. Intractable vomiting with nausea, vomiting of unspecified type   2. Diarrhea    Patient and husband referred to [facility] ER for further testing, nausea/emesis control, and IV fluids    Kandra Nicolas, MD 09/07/14 [phone]

## 2014-09-20 ENCOUNTER — Other Ambulatory Visit: Payer: Self-pay

## 2014-09-24 ENCOUNTER — Other Ambulatory Visit (INDEPENDENT_AMBULATORY_CARE_PROVIDER_SITE_OTHER): Payer: Self-pay | Admitting: *Deleted

## 2014-09-24 ENCOUNTER — Other Ambulatory Visit (INDEPENDENT_AMBULATORY_CARE_PROVIDER_SITE_OTHER): Payer: Self-pay

## 2014-09-24 DIAGNOSIS — Z0000 Encounter for general adult medical examination without abnormal findings: Secondary | ICD-10-CM

## 2014-09-24 LAB — POCT URINALYSIS DIPSTICK
Bilirubin, UA: NEGATIVE
Blood, UA: NEGATIVE
GLUCOSE UA: NEGATIVE
Ketones, UA: NEGATIVE
Nitrite, UA: NEGATIVE
Protein, UA: NEGATIVE
UROBILINOGEN UA: 0.2
pH, UA: 6

## 2014-09-24 LAB — CBC WITH DIFFERENTIAL/PLATELET
Basophils Absolute: 0 10*3/uL (ref 0.0–0.1)
Basophils Relative: 0.5 % (ref 0.0–3.0)
EOS PCT: 1.2 % (ref 0.0–5.0)
Eosinophils Absolute: 0.1 10*3/uL (ref 0.0–0.7)
HCT: 39.7 % (ref 36.0–46.0)
Hemoglobin: 13.5 g/dL (ref 12.0–15.0)
LYMPHS ABS: 1.3 10*3/uL (ref 0.7–4.0)
LYMPHS PCT: 20.9 % (ref 12.0–46.0)
MCHC: 33.9 g/dL (ref 30.0–36.0)
MCV: 89.6 fl (ref 78.0–100.0)
MONOS PCT: 7 % (ref 3.0–12.0)
Monocytes Absolute: 0.4 10*3/uL (ref 0.1–1.0)
NEUTROS PCT: 70.4 % (ref 43.0–77.0)
Neutro Abs: 4.4 10*3/uL (ref 1.4–7.7)
Platelets: 312 10*3/uL (ref 150.0–400.0)
RBC: 4.43 Mil/uL (ref 3.87–5.11)
RDW: 12.8 % (ref 11.5–15.5)
WBC: 6.3 10*3/uL (ref 4.0–10.5)

## 2014-09-24 LAB — TSH: TSH: 1.39 u[IU]/mL (ref 0.35–4.50)

## 2014-09-24 LAB — LIPID PANEL
CHOLESTEROL: 141 mg/dL (ref 0–200)
HDL: 49 mg/dL (ref >39.00)
LDL CALC: 82 mg/dL (ref 0–99)
NonHDL: 92
Total CHOL/HDL Ratio: 3
Triglycerides: 48 mg/dL (ref 0.0–149.0)
VLDL: 9.6 mg/dL (ref 0.0–40.0)

## 2014-09-24 LAB — COMPREHENSIVE METABOLIC PANEL
ALBUMIN: 4.3 g/dL (ref 3.5–5.2)
ALT: 14 U/L (ref 0–35)
AST: 14 U/L (ref 0–37)
Alkaline Phosphatase: 50 U/L (ref 39–117)
BUN: 12 mg/dL (ref 6–23)
CHLORIDE: 106 meq/L (ref 96–112)
CO2: 26 mEq/L (ref 19–32)
CREATININE: 0.82 mg/dL (ref 0.40–1.20)
Calcium: 9.6 mg/dL (ref 8.4–10.5)
GFR: 88.5 mL/min (ref >60.00)
GLUCOSE: 102 mg/dL — AB (ref 70–99)
Potassium: 4.1 mEq/L (ref 3.5–5.1)
Sodium: 141 mEq/L (ref 135–145)
Total Bilirubin: 0.3 mg/dL (ref 0.2–1.2)
Total Protein: 7.2 g/dL (ref 6.0–8.3)

## 2014-09-27 ENCOUNTER — Ambulatory Visit (INDEPENDENT_AMBULATORY_CARE_PROVIDER_SITE_OTHER): Payer: BLUE CROSS/BLUE SHIELD | Admitting: Family Medicine

## 2014-09-27 ENCOUNTER — Encounter: Payer: Self-pay | Admitting: Family Medicine

## 2014-09-27 VITALS — BP 108/64 | Temp 97.8°F | Ht 68.0 in | Wt 191.0 lb

## 2014-09-27 DIAGNOSIS — Z0000 Encounter for general adult medical examination without abnormal findings: Secondary | ICD-10-CM

## 2014-09-27 HISTORY — DX: Encounter for general adult medical examination without abnormal findings: Z00.00

## 2014-09-27 MED ORDER — CLONAZEPAM 1 MG PO TABS: ORAL_TABLET | ORAL | Status: DC

## 2014-09-27 MED ORDER — DOXYCYCLINE HYCLATE 100 MG PO TABS: ORAL_TABLET | ORAL | Status: DC

## 2014-09-27 MED ORDER — FLUOXETINE HCL 40 MG PO CAPS: 40.0000 mg | ORAL_CAPSULE | Freq: Every day | ORAL | Status: DC

## 2014-09-27 NOTE — Progress Notes (Signed)
Pre visit review using our clinic review tool, if applicable. No additional management support is needed unless otherwise documented below in the visit note. 

## 2014-09-27 NOTE — Progress Notes (Signed)
   Subjective:    Patient ID: Brandy [name], female    DOB: June 12, 1986, 29 y.o.   MRN: [id number]  HPI Brandy [name] is a 29 year old single female nonsmoker engaged remarried in March 2017 who comes in today for general physical examination  She's had a good year except she passed another kidney stone. She's been to urology they've done stone analysis and do not feel any medications would help prevent her from having recurrent kidney stones. 2 years ago she had her gallbladder removed.  She wears glasses so she gets routine eye care, regular dental care, pelvics and Paps she goes to see Dr. Ulanda Edison. Currently condoms for birth control. LMP a week ago was heavily. We discussed various options including IUDs NuvaRing and birth control pills. She will pursue that discussion with Dr. Lannette Donath her gynecologist.  She takes Klonopin 1/2 mg daily at bedtime for panic attacks and is on Prozac 20 mg daily. She doesn't feel like the Prozac's helping. We discussed various options. 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   HENT: Negative.   Eyes: Negative.   Respiratory: Negative.   Cardiovascular: Negative.   Gastrointestinal: Negative.   Endocrine: Negative.   Genitourinary: Negative.   Musculoskeletal: Negative.   Skin: Negative.   Allergic/Immunologic: Negative.   Neurological: Negative.   Hematological: Negative.   Psychiatric/Behavioral: Negative.        Objective:   Physical Exam  Constitutional: She appears well-developed and well-nourished.  HENT:  Head: Normocephalic and atraumatic.  Right Ear: External ear normal.  Left Ear: External ear normal.  Nose: Nose normal.  Mouth/Throat: Oropharynx is clear and moist.  Eyes: EOM are normal. Pupils are equal, round, and reactive to light.  Neck: Normal  range of motion. Neck supple. No JVD present. No tracheal deviation present. No thyromegaly present.  Cardiovascular: Normal rate, regular rhythm, normal heart sounds and intact distal pulses.  Exam reveals no gallop and no friction rub.   No murmur heard. Pulmonary/Chest: Effort normal and breath sounds normal. No stridor. No respiratory distress. She has no wheezes. She has no rales. She exhibits no tenderness.  Abdominal: Soft. Bowel sounds are normal. She exhibits no distension and no mass. There is no tenderness. There is no rebound and no guarding.  Musculoskeletal: Normal range of motion.  Lymphadenopathy:    She has no cervical adenopathy.  Neurological: She is alert. She has normal reflexes. No cranial nerve deficit. She exhibits normal muscle tone. Coordination normal.  Skin: Skin is warm and dry. No rash noted. No erythema. No pallor.  Total body skin exam normal  Psychiatric: She has a normal mood and affect. Her behavior is normal. Judgment and thought content normal.          Assessment & Plan:  Healthy female  Overweight,,,,,,,,, set a goal of 1 to exercise 3 times a week. 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  History of kidney stones  Status post cholecystectomy  Panic attacks,,,,,,,, increase the Prozac to 40 mg daily continue Klonopin one half tab daily at bedtime  Follow-up in 2 months  Episodic acne,,,,,,,,,,,,, Doxy 100 twice a day when necessary

## 2014-09-27 NOTE — Patient Instructions (Signed)
Set a goal of going to the gym one hour 3 days per week  Increase the Prozac to 40 mg daily continue the Klonopin one half tab daily at bedtime  Follow-up in 2 months

## 2014-11-15 ENCOUNTER — Ambulatory Visit: Payer: BLUE CROSS/BLUE SHIELD | Admitting: Family Medicine

## 2014-11-25 ENCOUNTER — Emergency Department (INDEPENDENT_AMBULATORY_CARE_PROVIDER_SITE_OTHER): Payer: BLUE CROSS/BLUE SHIELD

## 2014-11-25 ENCOUNTER — Encounter (HOSPITAL_COMMUNITY): Payer: Self-pay

## 2014-11-25 ENCOUNTER — Emergency Department (INDEPENDENT_AMBULATORY_CARE_PROVIDER_SITE_OTHER)
Admission: EM | Admit: 2014-11-25 | Discharge: 2014-11-25 | Disposition: A | Discharge status: Home / Self Care | Payer: BLUE CROSS/BLUE SHIELD | Source: Home / Self Care | Attending: Family Medicine | Admitting: Family Medicine

## 2014-11-25 DIAGNOSIS — J4530 Mild persistent asthma, uncomplicated: Secondary | ICD-10-CM

## 2014-11-25 MED ORDER — IPRATROPIUM BROMIDE 0.02 % IN SOLN
0.5000 mg | Freq: Once | RESPIRATORY_TRACT | Status: AC
  Administered 2014-11-25: 0.5 mg via RESPIRATORY_TRACT

## 2014-11-25 MED ORDER — IPRATROPIUM BROMIDE 0.02 % IN SOLN
RESPIRATORY_TRACT | Status: AC
  Filled 2014-11-25: qty 2.5

## 2014-11-25 MED ORDER — ALBUTEROL SULFATE (2.5 MG/3ML) 0.083% IN NEBU
5.0000 mg | INHALATION_SOLUTION | Freq: Once | RESPIRATORY_TRACT | Status: AC
  Administered 2014-11-25: 5 mg via RESPIRATORY_TRACT

## 2014-11-25 MED ORDER — PREDNISONE (PAK) 10 MG PO TABS: ORAL_TABLET | Freq: Every day | ORAL | Status: DC

## 2014-11-25 MED ORDER — METHYLPREDNISOLONE SODIUM SUCC 125 MG IJ SOLR
INTRAMUSCULAR | Status: AC
  Filled 2014-11-25: qty 2

## 2014-11-25 MED ORDER — METHYLPREDNISOLONE SODIUM SUCC 125 MG IJ SOLR
125.0000 mg | Freq: Once | INTRAMUSCULAR | Status: AC
  Administered 2014-11-25: 125 mg via INTRAMUSCULAR

## 2014-11-25 MED ORDER — ALBUTEROL SULFATE (2.5 MG/3ML) 0.083% IN NEBU
INHALATION_SOLUTION | RESPIRATORY_TRACT | Status: AC
  Filled 2014-11-25: qty 6

## 2014-11-25 NOTE — ED Provider Notes (Signed)
CSN: [id number]     Arrival date & time 11/25/14  1331 History   First MD Initiated Contact with Patient 11/25/14 1345     Chief Complaint  Patient presents with  . Cough  . Fever   (Consider location/radiation/quality/duration/timing/severity/associated sxs/prior Treatment) Patient is a 29 y.o. female presenting with URI. The history is provided by the patient.  URI Presenting symptoms: cough   Presenting symptoms: no congestion, no fever, no rhinorrhea and no sore throat   Severity:  Moderate Onset quality:  Sudden Duration:  4 days Progression:  Worsening Chronicity:  New Worsened by:  Nothing tried Ineffective treatments:  None tried Associated symptoms: wheezing   Risk factors: sick contacts   Risk factors comment:  Mother with pna.   Past Medical History  Diagnosis Date  . Migraine   . Anxiety   . History of nephrolithiasis   . Depression   . Kidney stone    Past Surgical History  Procedure Laterality Date  . Kidney stone surgery  1997, 2004  . Cholecystectomy  2014   Family History  Problem Relation Age of Onset  . Arthritis    . Diabetes    . Hypertension    . Lung cancer    . Colon cancer    . Breast cancer     History  Substance Use Topics  . Smoking status: Never Smoker   . Smokeless tobacco: Never Used  . Alcohol Use: No   OB History    Gravida Para Term Preterm AB TAB SAB Ectopic Multiple Living   0               Obstetric Comments   Age first period 67     Review of Systems  Constitutional: Negative.  Negative for fever.  HENT: Negative for congestion, postnasal drip, rhinorrhea and sore throat.   Respiratory: Positive for cough, shortness of breath and wheezing.   Cardiovascular: Negative.   Gastrointestinal: Negative.     Allergies  Penicillins  Home Medications   Prior to Admission medications   Medication Sig Start Date End Date Taking? Authorizing Provider  clonazePAM (KLONOPIN) 1 MG tablet TAKE ONE half  TABLET BY MOUTH  AS NEEDED FOR PANIC ATTACK 09/27/14   Dorena Cookey, MD  doxycycline (VIBRA-TABS) 100 MG tablet 1 by mouth twice a day for acne 09/27/14   Dorena Cookey, MD  FLUoxetine (PROZAC) 40 MG capsule Take 1 capsule (40 mg total) by mouth daily. 09/27/14   Dorena Cookey, MD  predniSONE (STERAPRED UNI-PAK) 10 MG tablet Take by mouth daily. Take 6 tabs by mouth daily  for 2 days, then 5 tabs for 2 days, then 4 tabs for 2 days, then 3 tabs for 2 days, 2 tabs for 2 days, then 1 tab by mouth daily for 2 days 11/25/14   Billy Fischer, MD   Pulse 114  Temp(Src) 99.1 F (37.3 C) (Oral)  Resp 18  SpO2 98%  LMP 11/11/2014 (Approximate) Physical Exam  Constitutional: She is oriented to person, place, and time. She appears well-developed and well-nourished.  HENT:  Right Ear: External ear normal.  Left Ear: External ear normal.  Mouth/Throat: Oropharynx is clear and moist.  Eyes: Pupils are equal, round, and reactive to light.  Neck: Normal range of motion. Neck supple.  Cardiovascular: Normal rate, regular rhythm, normal heart sounds and intact distal pulses.   Pulmonary/Chest: Effort normal. She has wheezes. She has no rales. She exhibits no tenderness.  Lymphadenopathy:  She has no cervical adenopathy.  Neurological: She is alert and oriented to person, place, and time.  Skin: Skin is warm and dry.  Nursing note and vitals reviewed.   ED Course  Procedures (including critical care time) Labs Review Labs Reviewed - No data to display  Imaging Review  X-rays reviewed and report per radiologist.    MDM   1. Asthma, allergic, mild persistent, uncomplicated    Sx improved with treatment     Billy Fischer, MD 11/25/14 1434

## 2014-11-25 NOTE — Discharge Instructions (Signed)
Start medicine on Monday, drink lots of fluids, use mucinex daily, see your doctor as needed.

## 2014-11-25 NOTE — ED Notes (Signed)
C/o cough, fever, wheezing (never before)  X past few days. Mother has pneumonia

## 2014-11-27 ENCOUNTER — Ambulatory Visit: Payer: BLUE CROSS/BLUE SHIELD | Admitting: Internal Medicine

## 2014-12-21 NOTE — Op Note (Signed)
PATIENT NAME:  Brandy [name], Brandy [name] MR#:  [id number] DATE OF BIRTH:  1986/08/25  DATE OF PROCEDURE:  12/02/2012  PREOPERATIVE DIAGNOSIS:  Chronic cholecystitis.   POSTOPERATIVE DIAGNOSIS:  Chronic cholecystitis with cholelithiasis.   OPERATIVE PROCEDURE:  Laparoscopic cholecystectomy with intraoperative cholangiograms.   SURGEON:  Hervey Ard, M.D.   ANESTHESIA:  General endotracheal under Dr. Andree Elk.   ESTIMATED BLOOD LOSS:  Minimal.   CLINICAL NOTE:  This 29 year old woman has had a long history of intermittent right upper quadrant pain with radiation to the back.  Serial ultrasounds have shown enlarging polyps.  She is felt to be a candidate for elective cholecystectomy.   OPERATIVE NOTE:  With the patient under adequate general endotracheal anesthesia, the abdomen was prepped with ChloraPrep and draped.  A Veress needle was placed through a transumbilical incision with the patient in Trendelenburg position and pneumoperitoneum established.  Hanging drop test was utilized to confirm intra-abdominal location.  A 10 mm step port was expanded and inspection showed no evidence of injury from initial port placement.  The patient was placed in a reverse Trendelenburg position and rolled to the left.  An 11 mm Xcel port was placed in the epigastrium and 2 5-mm step ports placed laterally.  The gallbladder was placed on cephalad traction.  There were a few adhesions from the omentum to the inferior surface of the gallbladder, a more prominent inflammatory process near the neck of the gallbladder.  The neck of the gallbladder was cleared.  Fluoroscopic cholangiograms were completed using one-half strength Conray 60.  This suggested that the cystic duct entered into the right hepatic duct.  The fluoroscopic unit would not replay the images and the sole image submitted for the radiologist is of suboptimal quality.  Because of the question of an additional ductal structure, the cystic duct was divided  immediately adjacent to the gallbladder.  This area was then carefully dissected and the branches of the cystic artery doubly clipped and divided.  The gallbladder was removed from the liver bed making use of hook cautery dissection without any evidence of bile spillage or visible additional ductal structures.  The gallbladder was delivered through the umbilical port site without incident.  Cholesterol stones were identified as well as a 6 mm polyp.  The pneumoperitoneum was re-established and inspection from the epigastric site was notable for a thin band of adhesions near the hepatic flexure, but otherwise an unremarkable intra-abdominal exam.  No evidence of injury from initial port placement.  The right upper quadrant was irrigated with lactated Ringer solution and good hemostasis noted.  The abdomen was desufflated and ports removed under direct vision.  Skin incisions were closed with 4-0 Vicryl subcuticular sutures.  Benzoin, Steri-Strips, Telfa, and Tegaderm dressings were applied.   The patient tolerated the procedure well and was taken to the recovery room in stable condition.     ____________________________ Robert Bellow, MD jwb:ea D: 12/02/2012 17:11:36 ET T: 12/03/2012 03:32:21 ET JOB#: [id number]  cc: Robert Bellow, MD, <Dictator> Stevie Kern, M.D. in Chryl Heck, MD Koltyn Kelsay Amedeo Kinsman MD ELECTRONICALLY SIGNED 12/06/2012 8:34

## 2015-01-05 ENCOUNTER — Other Ambulatory Visit: Payer: Self-pay | Admitting: Family Medicine

## 2015-01-23 ENCOUNTER — Ambulatory Visit (INDEPENDENT_AMBULATORY_CARE_PROVIDER_SITE_OTHER): Payer: BLUE CROSS/BLUE SHIELD | Admitting: Family Medicine

## 2015-01-23 ENCOUNTER — Encounter: Payer: Self-pay | Admitting: Family Medicine

## 2015-01-23 VITALS — BP 120/80 | Temp 98.5°F | Wt 174.0 lb

## 2015-01-23 DIAGNOSIS — F32A Depression, unspecified: Secondary | ICD-10-CM

## 2015-01-23 DIAGNOSIS — F329 Major depressive disorder, single episode, unspecified: Secondary | ICD-10-CM | POA: Diagnosis not present

## 2015-01-23 MED ORDER — CITALOPRAM HYDROBROMIDE 10 MG PO TABS: 10.0000 mg | ORAL_TABLET | Freq: Every day | ORAL | Status: DC

## 2015-01-23 NOTE — Progress Notes (Signed)
   Subjective:    Patient ID: Brandy [name], female    DOB: 1986/06/04, 29 y.o.   MRN: [id number]  HPI Brandy [name] is a 29 year old single female nonsmoker who comes in today for evaluation depression  She stopped her antidepressant a while back. She was on Prozac 40 mg daily. We've tried different medications nothing has seemed to help in a long-term. We send her to see Dr. Letta [name]. Brandy [name] tried on Effexor but she had a lot of side effects from that.  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 She's moved back home and lives with her mother.  She's also self injecting with amino acids vitamin B12 and his started phentermine 37.5 mg daily because of her weight gain. She's down 17 pounds since January   Review of Systems    review of systems otherwise negative Objective:   Physical Exam Well-developed well-nourished female no acute distress vital signs stable she's afebrile       Assessment & Plan:  History of depression.......... trial of Celexa 10 mg daily...........Marland Kitchen reconsult with Brandy [name]  Obesity,,,,,,,,,,,, diet and exercise.

## 2015-01-23 NOTE — Patient Instructions (Signed)
Celexa 10 mg.....Marland Kitchen one tablet daily at bedtime  Walk 30 minutes daily  If in 6 weeks your feeling well continue the 10 mg dose....... if not call Apolonio Schneiders extension [phone] and we'll increase it to 20 mg daily  If you having side effects from this medication I would recommend you reconsult with Dr. Letta Moynahan in her group  Call Bryson Ha........ set up a time to discuss your concerns

## 2015-01-23 NOTE — Progress Notes (Signed)
Pre visit review using our clinic review tool, if applicable. No additional management support is needed unless otherwise documented below in the visit note. 

## 2015-06-28 ENCOUNTER — Encounter: Payer: Self-pay | Admitting: Adult Health

## 2015-06-28 ENCOUNTER — Ambulatory Visit (INDEPENDENT_AMBULATORY_CARE_PROVIDER_SITE_OTHER): Payer: BLUE CROSS/BLUE SHIELD | Admitting: Adult Health

## 2015-06-28 VITALS — BP 98/68 | Temp 98.6°F | Ht 68.0 in | Wt 168.3 lb

## 2015-06-28 DIAGNOSIS — Z3201 Encounter for pregnancy test, result positive: Secondary | ICD-10-CM

## 2015-06-28 MED ORDER — ONDANSETRON HCL 4 MG PO TABS: 4.0000 mg | ORAL_TABLET | Freq: Three times a day (TID) | ORAL | Status: DC | PRN

## 2015-06-28 NOTE — Progress Notes (Signed)
   Subjective:    Patient ID: Brandy [name], female    DOB: Oct 30, 1985, 29 y.o.   MRN: [id number]  HPI  29 year old female who presents to the office due to positive pregnancy test at home. She took five tests and they all came back positive. Her LMP was 06/02/2015 and was normal. Her only complaint today is that of being nauseated.   She stopped taking her klonopin one week ago.    Review of Systems  Gastrointestinal: Positive for nausea.  All other systems reviewed and are negative.  Past Medical History  Diagnosis Date  . Migraine   . Anxiety   . History of nephrolithiasis   . Depression   . Kidney stone     Social History   Social History  . Marital Status: Single    Spouse Name: N/A  . Number of Children: N/A  . Years of Education: N/A   Occupational History  . Not on file.   Social History Main Topics  . Smoking status: Never Smoker   . Smokeless tobacco: Never Used  . Alcohol Use: No  . Drug Use: No  . Sexual Activity: Yes    Birth Control/ Protection: Condom   Other Topics Concern  . Not on file   Social History Narrative    Past Surgical History  Procedure Laterality Date  . Kidney stone surgery  1997, 2004  . Cholecystectomy  2014    Family History  Problem Relation Age of Onset  . Arthritis    . Diabetes    . Hypertension    . Lung cancer    . Colon cancer    . Breast cancer      Allergies  Allergen Reactions  . Penicillins     REACTION: HIves    Current Outpatient Prescriptions on File Prior to Visit  Medication Sig Dispense Refill  . citalopram (CELEXA) 10 MG tablet Take 1 tablet (10 mg total) by mouth daily. 100 tablet 3  . clonazePAM (KLONOPIN) 1 MG tablet TAKE ONE TABLET BY MOUTH AS NEEDED (Patient not taking: Reported on 06/28/2015) 30 tablet 5   No current facility-administered medications on file prior to visit.    BP 98/68 mmHg  Temp(Src) 98.6 F (37 C) (Oral)  Ht 5\' 8"  (1.727 m)  Wt 168 lb 4.8 oz (76.34 kg)  BMI  25.60 kg/m2  LMP 06/02/2015       Objective:   Physical Exam  Constitutional: She is oriented to person, place, and time. She appears well-developed and well-nourished. No distress.  Neurological: She is alert and oriented to person, place, and time.  Skin: She is not diaphoretic.  Psychiatric: She has a normal mood and affect. Her behavior is normal. Judgment and thought content normal.  Nursing note and vitals reviewed.       Assessment & Plan:  1. Positive pregnancy test - ondansetron (ZOFRAN) 4 MG tablet; Take 1 tablet (4 mg total) by mouth every 8 (eight) hours as needed for nausea or vomiting.  Dispense: 20 tablet; Refill: 6 - HCG, Qualitative - Urine pregnancy test positive

## 2015-06-28 NOTE — Patient Instructions (Signed)
Your urine pregnancy test was positive. We will confirm this with a blood test. I will call you when I get the results.   Use the Zofran as needed  Follow up with OB/GYN and Dr. Sherren Mocha when needed.

## 2015-06-28 NOTE — Progress Notes (Signed)
Pre visit review using our clinic review tool, if applicable. No additional management support is needed unless otherwise documented below in the visit note. 

## 2015-06-29 LAB — HCG, SERUM, QUALITATIVE: PREG SERUM: POSITIVE — AB

## 2015-07-10 ENCOUNTER — Inpatient Hospital Stay (HOSPITAL_COMMUNITY)
Admission: AD | Admit: 2015-07-10 | Discharge: 2015-07-10 | Disposition: A | Discharge status: Home / Self Care | Payer: BLUE CROSS/BLUE SHIELD | Source: Ambulatory Visit | Attending: Obstetrics and Gynecology | Admitting: Obstetrics and Gynecology

## 2015-07-10 ENCOUNTER — Telehealth: Payer: Self-pay | Admitting: Family Medicine

## 2015-07-10 ENCOUNTER — Inpatient Hospital Stay (HOSPITAL_COMMUNITY): Payer: BLUE CROSS/BLUE SHIELD

## 2015-07-10 ENCOUNTER — Encounter (HOSPITAL_COMMUNITY): Payer: Self-pay | Admitting: *Deleted

## 2015-07-10 DIAGNOSIS — O4691 Antepartum hemorrhage, unspecified, first trimester: Secondary | ICD-10-CM

## 2015-07-10 DIAGNOSIS — O209 Hemorrhage in early pregnancy, unspecified: Secondary | ICD-10-CM | POA: Insufficient documentation

## 2015-07-10 DIAGNOSIS — Z3A01 Less than 8 weeks gestation of pregnancy: Secondary | ICD-10-CM | POA: Diagnosis not present

## 2015-07-10 DIAGNOSIS — O3680X0 Pregnancy with inconclusive fetal viability, not applicable or unspecified: Secondary | ICD-10-CM

## 2015-07-10 LAB — CBC WITH DIFFERENTIAL/PLATELET
BASOS PCT: 0 %
Basophils Absolute: 0 10*3/uL (ref 0.0–0.1)
Eosinophils Absolute: 0.1 10*3/uL (ref 0.0–0.7)
Eosinophils Relative: 1 %
HEMATOCRIT: 38.4 % (ref 36.0–46.0)
HEMOGLOBIN: 12.5 g/dL (ref 12.0–15.0)
LYMPHS ABS: 1.4 10*3/uL (ref 0.7–4.0)
LYMPHS PCT: 23 %
MCH: 30 pg (ref 26.0–34.0)
MCHC: 32.6 g/dL (ref 30.0–36.0)
MCV: 92.3 fL (ref 78.0–100.0)
MONO ABS: 0.5 10*3/uL (ref 0.1–1.0)
MONOS PCT: 8 %
NEUTROS ABS: 4.1 10*3/uL (ref 1.7–7.7)
NEUTROS PCT: 68 %
Platelets: 291 10*3/uL (ref 150–400)
RBC: 4.16 MIL/uL (ref 3.87–5.11)
RDW: 13 % (ref 11.5–15.5)
WBC: 6.1 10*3/uL (ref 4.0–10.5)

## 2015-07-10 LAB — URINALYSIS, ROUTINE W REFLEX MICROSCOPIC
Bilirubin Urine: NEGATIVE
GLUCOSE, UA: NEGATIVE mg/dL
Ketones, ur: NEGATIVE mg/dL
Leukocytes, UA: NEGATIVE
Nitrite: NEGATIVE
PROTEIN: NEGATIVE mg/dL
SPECIFIC GRAVITY, URINE: 1.02 (ref 1.005–1.030)
Urobilinogen, UA: 0.2 mg/dL (ref 0.0–1.0)
pH: 7.5 (ref 5.0–8.0)

## 2015-07-10 LAB — HCG, QUANTITATIVE, PREGNANCY: HCG, BETA CHAIN, QUANT, S: 101 m[IU]/mL — AB (ref <5)

## 2015-07-10 LAB — URINE MICROSCOPIC-ADD ON

## 2015-07-10 LAB — ABO/RH: ABO/RH(D): A POS

## 2015-07-10 LAB — POCT PREGNANCY, URINE: Preg Test, Ur: POSITIVE — AB

## 2015-07-10 NOTE — MAU Note (Signed)
Pt presents to MAU with complaints of brown vaginal bleeding that started this morning with cramping last night. Pt had + pregnancy test October the 27th.

## 2015-07-10 NOTE — Telephone Encounter (Signed)
Pt would like blood work results °

## 2015-07-10 NOTE — Telephone Encounter (Signed)
Updated patient with blood work results

## 2015-07-10 NOTE — Discharge Instructions (Signed)

## 2015-07-10 NOTE — MAU Provider Note (Signed)
History     CSN: [id number]  Arrival date and time: 07/10/15 1256   None     Chief Complaint  Patient presents with  . Vaginal Bleeding   HPI Brandy [name] 29 y.o. G1P0 @[redacted]w[redacted]d  presents to MAU complaining of cramping in RLQ only.  They were sharp last night but improved today.  Upon wiping this am, she had brown blood.  She has seen this a few times today but it has not gone beyond spotting.  The cramping has remained today at a 2-3/10.  She denies nausea, vomiting, fever, weakness, dysuria.  She does have a headache, 4/10.   OB History    Gravida Para Term Preterm AB TAB SAB Ectopic Multiple Living   1               Obstetric Comments   Age first period 9      Past Medical History  Diagnosis Date  . Migraine   . Anxiety   . History of nephrolithiasis   . Depression   . Kidney stone     Past Surgical History  Procedure Laterality Date  . Kidney stone surgery  1997, 2004  . Cholecystectomy  2014    Family History  Problem Relation Age of Onset  . Arthritis    . Diabetes    . Hypertension    . Lung cancer    . Colon cancer    . Breast cancer      Social History  Substance Use Topics  . Smoking status: Never Smoker   . Smokeless tobacco: Never Used  . Alcohol Use: No    Allergies:  Allergies  Allergen Reactions  . Penicillins     REACTION: HIves    Prescriptions prior to admission  Medication Sig Dispense Refill Last Dose  . citalopram (CELEXA) 10 MG tablet Take 1 tablet (10 mg total) by mouth daily. 100 tablet 3 Taking  . clonazePAM (KLONOPIN) 1 MG tablet TAKE ONE TABLET BY MOUTH AS NEEDED (Patient not taking: Reported on 06/28/2015) 30 tablet 5 Not Taking  . ondansetron (ZOFRAN) 4 MG tablet Take 1 tablet (4 mg total) by mouth every 8 (eight) hours as needed for nausea or vomiting. 20 tablet 6     ROS Pertinent ROS in HPI.  All other systems are negative.   Physical Exam   Blood pressure 133/76, pulse 87, temperature 98.2 F (36.8 C), resp.  rate 18, height 5\' 8"  (1.727 m), weight 168 lb (76.204 kg), last menstrual period 06/02/2015.  Physical Exam  Constitutional: She is oriented to person, place, and time. She appears well-developed and well-nourished. No distress.  HENT:  Head: Normocephalic and atraumatic.  Eyes: Conjunctivae and EOM are normal.  Neck: Normal range of motion. Neck supple.  Cardiovascular: Normal rate, regular rhythm and normal heart sounds.   Respiratory: Effort normal and breath sounds normal. No respiratory distress.  GI: Soft. Bowel sounds are normal. She exhibits no distension. There is tenderness.  Mildly TTP in RLQ  Musculoskeletal: Normal range of motion. She exhibits no edema.  Neurological: She is alert and oriented to person, place, and time.  Skin: Skin is warm and dry.  Psychiatric: She has a normal mood and affect. Her behavior is normal.   Results for orders placed or performed during the hospital encounter of 07/10/15 (from the past 24 hour(s))  Urinalysis, Routine w reflex microscopic (not at [facility])     Status: Abnormal   Collection Time: 07/10/15  1:10 PM  Result Value Ref Range   Color, Urine YELLOW YELLOW   APPearance CLOUDY (A) CLEAR   Specific Gravity, Urine 1.020 1.005 - 1.030   pH 7.5 5.0 - 8.0   Glucose, UA NEGATIVE NEGATIVE mg/dL   Hgb urine dipstick MODERATE (A) NEGATIVE   Bilirubin Urine NEGATIVE NEGATIVE   Ketones, ur NEGATIVE NEGATIVE mg/dL   Protein, ur NEGATIVE NEGATIVE mg/dL   Urobilinogen, UA 0.2 0.0 - 1.0 mg/dL   Nitrite NEGATIVE NEGATIVE   Leukocytes, UA NEGATIVE NEGATIVE  Urine microscopic-add on     Status: Abnormal   Collection Time: 07/10/15  1:10 PM  Result Value Ref Range   Squamous Epithelial / LPF FEW (A) RARE   RBC / HPF 0-2 <3 RBC/hpf   Urine-Other AMORPHOUS URATES/PHOSPHATES   Pregnancy, urine POC     Status: Abnormal   Collection Time: 07/10/15  1:20 PM  Result Value Ref Range   Preg Test, Ur POSITIVE (A) NEGATIVE  CBC with Differential/Platelet      Status: None   Collection Time: 07/10/15  1:41 PM  Result Value Ref Range   WBC 6.1 4.0 - 10.5 K/uL   RBC 4.16 3.87 - 5.11 MIL/uL   Hemoglobin 12.5 12.0 - 15.0 g/dL   HCT 38.4 36.0 - 46.0 %   MCV 92.3 78.0 - 100.0 fL   MCH 30.0 26.0 - 34.0 pg   MCHC 32.6 30.0 - 36.0 g/dL   RDW 13.0 11.5 - 15.5 %   Platelets 291 150 - 400 K/uL   Neutrophils Relative % 68 %   Neutro Abs 4.1 1.7 - 7.7 K/uL   Lymphocytes Relative 23 %   Lymphs Abs 1.4 0.7 - 4.0 K/uL   Monocytes Relative 8 %   Monocytes Absolute 0.5 0.1 - 1.0 K/uL   Eosinophils Relative 1 %   Eosinophils Absolute 0.1 0.0 - 0.7 K/uL   Basophils Relative 0 %   Basophils Absolute 0.0 0.0 - 0.1 K/uL  ABO/Rh     Status: None (Preliminary result)   Collection Time: 07/10/15  1:41 PM  Result Value Ref Range   ABO/RH(D) A POS   hCG, quantitative, pregnancy     Status: Abnormal   Collection Time: 07/10/15  1:41 PM  Result Value Ref Range   hCG, Beta Chain, Quant, S 101 (H) <5 mIU/mL   US Ob Comp Less 14 Wks  07/10/2015  CLINICAL DATA:  Vaginal bleeding during first-trimester pregnancy. Five weeks 3 days pregnant by last menstrual EXAM: OBSTETRIC <14 WK [place] AND TRANSVAGINAL OB US TECHNIQUE: Both transabdominal and transvaginal ultrasound examinations were performed for complete evaluation of the gestation as well as the maternal uterus, adnexal regions, and pelvic cul-de-sac. Transvaginal technique was performed to assess early pregnancy. COMPARISON:  03/27/2015 stone study. FINDINGS: Intrauterine gestational sac: Absent Yolk sac:  Absent Embryo:  Absent Cardiac Activity: Absent Maternal uterus/adnexae: 10 mm endometrial thickness, slightly heterogeneous. The right ovary demonstrates a probable corpus luteal cyst, including at image 69. Left ovary is normal. Medial to the right ovary is a 1.6 cm centrally hypoechoic structure, including on image 88. IMPRESSION: 1. Lack of intrauterine gestational sac or fetal pole. 2. Hypoechoic structure medial  to the right ovary, suspicious for ectopic pregnancy. 3. Probable right ovarian corpus luteal cyst. Critical test results telephoned to.Karoline Caldwell. at the time of interpretation at 2:47 p.m.on 07/10/2015. Electronically Signed   By: Abigail Miyamoto M.D.   On: 07/10/2015 14:47   US Ob Transvaginal  07/10/2015  CLINICAL DATA:  Single episode of bleeding. Estimated gestational age by last menstrual period equals 5 weeks 3 days. EXAM: OBSTETRIC <14 WK [place] AND TRANSVAGINAL OB US TECHNIQUE: Both transabdominal and transvaginal ultrasound examinations were performed for complete evaluation of the gestation as well as the maternal uterus, adnexal regions, and pelvic cul-de-sac. Transvaginal technique was performed to assess early pregnancy. COMPARISON:  None. FINDINGS: Intrauterine gestational sac: Not identified Yolk sac:  Not identified Embryo:  Not identified Maternal uterus/adnexae: Nabothian cysts in lower uterine segment. LEFT ovary measures 2.3 x 1.3 x 2.3 cm. Normal small follicles and normal flow. RIGHT ovary measures 3.9 x 3.0 x 2.4 cm. Dominant follicle in the RIGHT ovary. Medial to the RIGHT adnexa there is a 1.5 x 1.5 x 1.2 cm rounded lesion which is mixed echogenicity but primarily solid. Minimal for blood flow associated with this lesion. Trace amount free fluid. IMPRESSION: 1. No intrauterine gestational sac identified. 2. Small mass adjacent to the RIGHT ovary is indeterminate. Cannot exclude ectopic pregnancy. 3. Trace amount of free fluid the posterior cul-de-sac. Electronically Signed   By: Suzy Bouchard M.D.   On: 07/10/2015 15:07   MAU Course  Procedures  MDM Ectopic workup ordered to eval pain and bleeding in pregnancy.   Blood type A+  Therefore no concern for Rh factor Hemodynamically stable Radiology called with report of hypoechoic structure medial to right ovary with concern for Ectopic.  Formal report available : structure is 1.6cm.  Discussed with Dr. Ulanda Edison.  Lab results and  ultrasound results all reviewed as well as pt presentation.  He is familiar with pt.  He advises for discharge of pt with careful precautions and follow up with Stat HCG in 48 hours.  At that time, DR. HENLEY WOULD LIKE TO BE CALLED PERSONALLY with her results, in place of the on call provider at the time.    Assessment and Plan  A:  1. Pregnancy of unknown anatomic location   2. Vaginal bleeding in pregnancy, first trimester    P: Discharge to home Ectopic precautions reviewed at length Pt may use Tylenol for HA but not to use strong pain medication as we do not want to mask pain symptoms.  Pt to return to MAU (and call Dr. Ulanda Edison) for any increase in pain, bleeding, weakness, dizziness, etc.  Pt informed of all possible outcomes.   Good self care advised Pelvic rest F/u in MAU in 48 hours for repeat quant (Dr. Ulanda Edison to be called with results). Patient may return to MAU as needed or if her condition were to change or worsen   Paticia Stack 07/10/2015, 1:22 PM

## 2015-07-11 ENCOUNTER — Inpatient Hospital Stay (HOSPITAL_COMMUNITY)
Admission: AD | Admit: 2015-07-11 | Discharge: 2015-07-11 | Disposition: A | Discharge status: Home / Self Care | Payer: BLUE CROSS/BLUE SHIELD | Source: Ambulatory Visit | Attending: Obstetrics and Gynecology | Admitting: Obstetrics and Gynecology

## 2015-07-11 ENCOUNTER — Encounter (HOSPITAL_COMMUNITY): Payer: Self-pay | Admitting: *Deleted

## 2015-07-11 DIAGNOSIS — O209 Hemorrhage in early pregnancy, unspecified: Secondary | ICD-10-CM | POA: Diagnosis present

## 2015-07-11 DIAGNOSIS — O200 Threatened abortion: Secondary | ICD-10-CM | POA: Insufficient documentation

## 2015-07-11 DIAGNOSIS — O3680X0 Pregnancy with inconclusive fetal viability, not applicable or unspecified: Secondary | ICD-10-CM

## 2015-07-11 DIAGNOSIS — Z3A01 Less than 8 weeks gestation of pregnancy: Secondary | ICD-10-CM | POA: Insufficient documentation

## 2015-07-11 LAB — CBC
HEMATOCRIT: 37.3 % (ref 36.0–46.0)
Hemoglobin: 12.3 g/dL (ref 12.0–15.0)
MCH: 30.4 pg (ref 26.0–34.0)
MCHC: 33 g/dL (ref 30.0–36.0)
MCV: 92.1 fL (ref 78.0–100.0)
PLATELETS: 284 10*3/uL (ref 150–400)
RBC: 4.05 MIL/uL (ref 3.87–5.11)
RDW: 13 % (ref 11.5–15.5)
WBC: 7.2 10*3/uL (ref 4.0–10.5)

## 2015-07-11 LAB — HCG, QUANTITATIVE, PREGNANCY: hCG, Beta Chain, Quant, S: 100 m[IU]/mL — ABNORMAL HIGH (ref <5)

## 2015-07-11 LAB — RPR: RPR Ser Ql: NONREACTIVE

## 2015-07-11 LAB — HIV ANTIBODY (ROUTINE TESTING W REFLEX): HIV Screen 4th Generation wRfx: NONREACTIVE

## 2015-07-11 NOTE — Discharge Instructions (Signed)

## 2015-07-11 NOTE — MAU Note (Signed)
Pt presents to MAU with complaints of heavy vaginal bleeding. PT was evaluated in MAU yesterday for vaginal bleeding with positive pregnancy test. U/S suspicious for ectopic and pt was scheduled to follow up tomorrow for [facility]

## 2015-07-11 NOTE — MAU Provider Note (Signed)
History   Chief Complaint:  Vaginal Bleeding   Brandy [name] is  29 y.o. G1P0 Patient's last menstrual period was 06/02/2015.Marland Kitchen Patient is here for follow up of quantitative HCG and ongoing surveillance of pregnancy status.   She is [redacted]w[redacted]d weeks gestation  by LMP. She was seen in maternity admissions yesterday for low abdominal pain and vaginal bleeding. She was supposed follow-up for Quant in 48 hours, return today for increase in bleeding.   Since her last visit, the patient is with new complaint.     ROS Abdomin Pain: Mild cramping across the entire low abdomen Vaginal bleeding: similar to period.   Passage of clots or tissue: Passage of small clots. Denies passage of tissue. Dizziness: None  Her previous Quantitative HCG values are:  07/10/15: 101  Previous ultrasound showed No intrauterine gestational sac identified. 2. Small mass adjacent to the RIGHT ovary is indeterminate. Cannot exclude ectopic pregnancy. 3. Trace amount of free fluid the posterior cul-de-sac  Physical Exam   BP 119/69 mmHg  Pulse 91  Temp(Src) 97.8 F (36.6 C)  Resp 18  LMP 06/02/2015 Constitutional: Well-nourished female in no apparent distress. No pallor. Anxious. Neuro: Alert and oriented 4 Cardiovascular: Normal rate Respiratory: Normal effort and rate Abdomen: Soft, nontender Gynecological Exam: Small amount of bright red blood on pad. Repeat speculum exam deferred due to recent exam.  Labs: Results for orders placed or performed during the hospital encounter of 07/11/15 (from the past 24 hour(s))  hCG, quantitative, pregnancy   Collection Time: 07/11/15  3:06 PM  Result Value Ref Range   hCG, Beta Chain, Quant, S 100 (H) <5 mIU/mL  CBC   Collection Time: 07/11/15  3:06 PM  Result Value Ref Range   WBC 7.2 4.0 - 10.5 K/uL   RBC 4.05 3.87 - 5.11 MIL/uL   Hemoglobin 12.3 12.0 - 15.0 g/dL   HCT 37.3 36.0 - 46.0 %   MCV 92.1 78.0 - 100.0 fL   MCH 30.4 26.0 - 34.0 pg   MCHC 33.0 30.0 -  36.0 g/dL   RDW 13.0 11.5 - 15.5 %   Platelets 284 150 - 400 K/uL    Ultrasound Studies:   NA  MAU course/MDM: Quantitative hCG, CBC ordered  Pain and bleeding in early pregnancy with drop in Quant, but hemodynamically stable. Attempted to call Dr. Lannette Donath per instructions on previous MAU note, but he was not available at the time. Call Dr. Willis Modena and discussed previous ultrasound in Quant, symptoms and exam. He reviewed ultrasound from yesterday. Low suspicion of ectopic. Moderate bleeding with drop in Quant more suggestive of miscarriage but ectopic pregnancy has not been ruled out.  Assessment: [redacted]w[redacted]d weeks gestation w/ drop in Quant 1. Threatened miscarriage in early pregnancy   2. Pregnancy of unknown anatomic location    Plan: Discharge home in stable condition per consult with Dr. Willis Modena. Bleeding and topic precautions   Follow-up Information    Follow up with Melina Schools, MD On 07/12/2015.   Specialty:  Obstetrics and Gynecology   Why:  Call afice after 8:30 am discuss further testing with Dr. Ulanda Edison.    Contact information:   [address], [place] [address] [phone]       Follow up with [facility].   Why:  As needed in emergencies (severe bleeding, severe pain, fever >100.4)   Contact information:   [address] [id number] [place] [phone]  Medication List    TAKE these medications        acetaminophen 500 MG tablet  Commonly known as:  TYLENOL  Take 500 mg by mouth every 6 (six) hours as needed for mild pain.     ondansetron 4 MG tablet  Commonly known as:  ZOFRAN  Take 4 mg by mouth every 8 (eight) hours as needed for nausea or vomiting.       Manya Silvas, CNM 07/11/2015, 2:49 PM  2/3

## 2015-07-11 NOTE — MAU Note (Signed)
Urine in lab 

## 2015-07-12 ENCOUNTER — Ambulatory Visit (HOSPITAL_COMMUNITY)
Admission: RE | Admit: 2015-07-12 | Discharge: 2015-07-12 | Disposition: A | Discharge status: Home / Self Care | Payer: BLUE CROSS/BLUE SHIELD | Source: Ambulatory Visit | Attending: Physician Assistant | Admitting: Physician Assistant

## 2015-07-12 ENCOUNTER — Other Ambulatory Visit: Payer: Self-pay | Admitting: Physician Assistant

## 2015-07-12 DIAGNOSIS — O209 Hemorrhage in early pregnancy, unspecified: Secondary | ICD-10-CM

## 2015-07-12 DIAGNOSIS — Z3A01 Less than 8 weeks gestation of pregnancy: Secondary | ICD-10-CM | POA: Insufficient documentation

## 2015-07-30 ENCOUNTER — Telehealth: Payer: Self-pay | Admitting: Family Medicine

## 2015-07-30 NOTE — Telephone Encounter (Signed)
Pt request refill of the following: KLONOPIN   Phamacy: [facility]

## 2015-08-05 MED ORDER — CLONAZEPAM 1 MG PO TABS: 1.0000 mg | ORAL_TABLET | Freq: Every day | ORAL | Status: DC | PRN

## 2015-08-05 NOTE — Telephone Encounter (Signed)
Spoke with patient and she is no longer pregnant.  Rx called.

## 2015-12-17 ENCOUNTER — Ambulatory Visit: Payer: BLUE CROSS/BLUE SHIELD | Admitting: Family Medicine

## 2015-12-30 ENCOUNTER — Ambulatory Visit (INDEPENDENT_AMBULATORY_CARE_PROVIDER_SITE_OTHER): Payer: Managed Care, Other (non HMO) | Admitting: Family Medicine

## 2015-12-30 ENCOUNTER — Encounter: Payer: Self-pay | Admitting: Family Medicine

## 2015-12-30 VITALS — BP 112/78 | HR 91 | Temp 98.2°F | Resp 12 | Ht 68.0 in | Wt 180.0 lb

## 2015-12-30 DIAGNOSIS — I889 Nonspecific lymphadenitis, unspecified: Secondary | ICD-10-CM | POA: Diagnosis not present

## 2015-12-30 DIAGNOSIS — E049 Nontoxic goiter, unspecified: Secondary | ICD-10-CM

## 2015-12-30 DIAGNOSIS — R5383 Other fatigue: Secondary | ICD-10-CM | POA: Insufficient documentation

## 2015-12-30 DIAGNOSIS — R5382 Chronic fatigue, unspecified: Secondary | ICD-10-CM | POA: Diagnosis not present

## 2015-12-30 DIAGNOSIS — M542 Cervicalgia: Secondary | ICD-10-CM | POA: Diagnosis not present

## 2015-12-30 DIAGNOSIS — E041 Nontoxic single thyroid nodule: Secondary | ICD-10-CM | POA: Insufficient documentation

## 2015-12-30 HISTORY — DX: Nontoxic goiter, unspecified: E04.9

## 2015-12-30 HISTORY — DX: Other fatigue: R53.83

## 2015-12-30 HISTORY — DX: Cervicalgia: M54.2

## 2015-12-30 NOTE — Progress Notes (Signed)
Pre visit review using our clinic review tool, if applicable. No additional management support is needed unless otherwise documented below in the visit note. 

## 2015-12-30 NOTE — Progress Notes (Signed)
Subjective:    Patient ID: Brandy [name], female    DOB: [birth year], 30 y.o.   MRN: [id number]  HPI   Ms. Brandy [name] is a 30 y.o.female here today with her husband c/o 2 days of anterior neck pain, intermittent, exacerbated by palpation and no alleviating factors identified. Throbbing/aching pain, moderate , woke her up last night, no radiated, no limitation of cervical ROM, and no hx of trauma. This morning a "little" difficulty swallowing liquids x 1, no stridor, no dysphagia for solids. Today morning noted a "knot" left cervical, tender, no skin changes.She is concerned about Lymphoma. She has not noted fever, chills,night sweats, or abnormal wt loss.  "A little" of SOB, doing "anything", no cough, no wheezing, and no chest pain. She has not noted LE edema or erythema, no recent travel and not on OCP's.  No recent URI, no insect bite, she has not taken any OTC analgesic for pain.  + Fatigue, which she has had for a while but worse for the past couple months. 07/2015 she had a miscarriage when [redacted] weeks pregnant. Denies Hx of anemia or heavy menses. LMP 12/29/15.  + Arthralgias , not related with main problem today, has had it for 2-3 months: IP hands and feet, "body pain." Hx of anxiety and depression, no suicidal thoughts. Sleeps well when she takes Clonazepam.  No tremor, palpitations, or changes in bowel habits.   Review of Systems  Constitutional: Positive for fatigue. Negative for fever, chills, appetite change and unexpected weight change.  HENT: Positive for trouble swallowing. Negative for facial swelling, mouth sores, nosebleeds, sore throat and voice change.   Eyes: Negative for visual disturbance.  Respiratory: Negative for cough, shortness of breath, wheezing and stridor.   Cardiovascular: Negative for chest pain, palpitations and leg swelling.  Gastrointestinal: Negative for nausea, vomiting and abdominal pain.       No changes in bowel  habits.   Genitourinary: Negative for dysuria, frequency and menstrual problem.  Musculoskeletal: Positive for arthralgias (chronic) and neck pain. Negative for myalgias, back pain, gait problem and neck stiffness.  Skin: Negative for color change and rash.  Neurological: Negative for tremors, seizures, syncope, facial asymmetry, weakness, numbness and headaches.  Hematological: Positive for adenopathy. Does not bruise/bleed easily.  Psychiatric/Behavioral: Negative for hallucinations, confusion and sleep disturbance. The patient is nervous/anxious.      Current Outpatient Prescriptions on File Prior to Visit  Medication Sig Dispense Refill  . clonazePAM (KLONOPIN) 1 MG tablet Take 1 tablet (1 mg total) by mouth daily as needed for anxiety. 30 tablet 3  . acetaminophen (TYLENOL) 500 MG tablet Take 500 mg by mouth every 6 (six) hours as needed for mild pain. Reported on 12/30/2015    . ondansetron (ZOFRAN) 4 MG tablet Take 4 mg by mouth every 8 (eight) hours as needed for nausea or vomiting. Reported on 12/30/2015     No current facility-administered medications on file prior to visit.     Past Medical History  Diagnosis Date  . Migraine   . Anxiety   . History of nephrolithiasis   . Depression   . Kidney stone     Social History   Social History  . Marital Status: Single    Spouse Name: N/A  . Number of Children: N/A  . Years of Education: N/A   Social History Main Topics  . Smoking status: Never Smoker   . Smokeless tobacco: Never Used  . Alcohol Use: No  . Drug  Use: No  . Sexual Activity: Yes    Birth Control/ Protection: Condom   Other Topics Concern  . None   Social History Narrative    Filed Vitals:   12/30/15 1508  BP: 112/78  Pulse: 91  Temp: 98.2 F (36.8 C)  Resp: 12   Body mass index is 27.38 kg/(m^2).  SpO2 Readings from Last 3 Encounters:  12/30/15 99%  11/25/14 98%  09/02/14 100%       Objective:   Physical Exam  Constitutional: She is  oriented to person, place, and time. She appears well-developed and well-nourished. She does not appear ill. No distress.  HENT:  Head: Atraumatic.  Mouth/Throat: Uvula is midline, oropharynx is clear and moist and mucous membranes are normal.  Eyes: Conjunctivae are normal.  Neck: Normal range of motion. No edema and no erythema present. Thyromegaly (mild) present. No thyroid mass present.  Tenderness upon thyroid gland palpation.  Cardiovascular: Normal rate and regular rhythm.   No murmur heard. Pulmonary/Chest: Effort normal and breath sounds normal. She has no decreased breath sounds. She has no wheezes. She has no rales. She exhibits no tenderness.  Abdominal: Soft. She exhibits no mass. There is no hepatosplenomegaly. There is no tenderness.  Musculoskeletal: Normal range of motion. She exhibits no edema or tenderness.  No signs of synovitis or joint deformities appreciated.  Lymphadenopathy:       Head (right side): No submental and no submandibular adenopathy present.       Head (left side): No submental and no submandibular adenopathy present.    She has cervical adenopathy.       Left cervical: Superficial cervical (tender, 1.5 cm, mobile) adenopathy present.       Right: No supraclavicular adenopathy present.       Left: No supraclavicular adenopathy present.  Neurological: She is alert and oriented to person, place, and time. She has normal strength. She displays no tremor. No cranial nerve deficit or sensory deficit. Coordination and gait normal.  Skin: Skin is warm. No rash noted. No erythema.  Psychiatric: Her speech is normal. Her mood appears anxious.  Well groomed, good eye contact.        Assessment & Plan:   Diagnoses and all orders for this visit:  Anterior neck pain -     TSH -     T4, free -     US Soft Tissue Head/Neck; Future  Enlarged thyroid gland -     TSH -     T4, free -     US Soft Tissue Head/Neck; Future  Chronic fatigue  Cervical  lymphadenitis -     TSH -     CBC with Differential   -We discussed possible causes of neck pain, examination today positive for tenderness and mild thyroid enlargement, no edema or worrisome findings. It could be an acute thyroiditis, which I explained it is usually benign and self limited. Instructed to monitor for new symptoms. Instructed about warning signs.  Thyroid u/s and labs order today.  -Explained that lymphadenitis are usually viral, we can monitor for now and if persistent for 4-6 weeks or increased in size Bx may be necessary.I do not think abx is needed at this time.  - In regard to fatigue, it seems to be chronic. A few chronic problems could be aggravating problems: depression/anxiety. Might need to consider anxiolytic medication, Cymbalta is a good option given the fact she also is having arthralgias and myalgias, ? Fibromyalgia. Recommended following with PCP.  Further recommendations will be given according to lab/imaging results. Work excuse given.   -Patient advised to return or notify a doctor immediately if symptoms worsen or persist or new concerns arise.   Dan Dissinger G. [place], MD  [facility]. [place] office.

## 2015-12-30 NOTE — Patient Instructions (Signed)
A few things to remember from today's visit:   1. Anterior neck pain  - TSH - T4, free - US Soft Tissue Head/Neck; Future  2. Enlarged thyroid gland Mild. - TSH - T4, free - US Soft Tissue Head/Neck; Future  3. Chronic fatigue It is a common symptom associated with multiple factors: psychologic,medications, systemic illness, sleep disorders,infections, and unknown causes.  Regular physical activity as tolerated and a healthy diet is usually might help and usually recommended for chronic fatigue.    4. Cervical lymphadenitis Viral most of the time. Monitor for now.  - TSH - CBC with Differential      If you sign-up for My chart, you can communicate easier with [place] in case you have any question or concern.

## 2015-12-31 ENCOUNTER — Encounter: Payer: Self-pay | Admitting: Family Medicine

## 2015-12-31 LAB — CBC WITH DIFFERENTIAL/PLATELET
BASOS ABS: 0 10*3/uL (ref 0.0–0.1)
Basophils Relative: 0.6 % (ref 0.0–3.0)
EOS ABS: 0.1 10*3/uL (ref 0.0–0.7)
Eosinophils Relative: 0.7 % (ref 0.0–5.0)
HEMATOCRIT: 38.8 % (ref 36.0–46.0)
HEMOGLOBIN: 13.2 g/dL (ref 12.0–15.0)
LYMPHS PCT: 21.4 % (ref 12.0–46.0)
Lymphs Abs: 1.7 10*3/uL (ref 0.7–4.0)
MCHC: 34 g/dL (ref 30.0–36.0)
MCV: 90.4 fl (ref 78.0–100.0)
Monocytes Absolute: 0.5 10*3/uL (ref 0.1–1.0)
Monocytes Relative: 6.7 % (ref 3.0–12.0)
NEUTROS ABS: 5.6 10*3/uL (ref 1.4–7.7)
Neutrophils Relative %: 70.6 % (ref 43.0–77.0)
PLATELETS: 294 10*3/uL (ref 150.0–400.0)
RBC: 4.3 Mil/uL (ref 3.87–5.11)
RDW: 12.9 % (ref 11.5–15.5)
WBC: 7.9 10*3/uL (ref 4.0–10.5)

## 2015-12-31 LAB — T4, FREE: FREE T4: 0.93 ng/dL (ref 0.60–1.60)

## 2015-12-31 LAB — TSH: TSH: 3.03 u[IU]/mL (ref 0.35–4.50)

## 2016-01-02 ENCOUNTER — Telehealth: Payer: Self-pay | Admitting: Family Medicine

## 2016-01-02 NOTE — Telephone Encounter (Signed)
Pt would like a call with her recent lab results (the values).

## 2016-01-02 NOTE — Telephone Encounter (Signed)
I may be wrong but I thought I did release and let her know about her lab results through My Chart. Neck U/S is pending. Thanks, BJ

## 2016-01-02 NOTE — Telephone Encounter (Signed)
Please review labs and advise.  Thanks.

## 2016-01-02 NOTE — Telephone Encounter (Signed)
I spoke to patient and she stated that she was seen at [facility] about 3 weeks ago and they did a TSH, at that time it was 1.7. She has never had Thyroid problems in the past but with the result from 2 days ago. The TSH level has rose to 3.03, she she is a little concerned with the change along with the symptoms she is having. I informed her to continue with the plan and have the US done so a better result can be given on the lymph node that is swollen and you will follow up with her after the [place].

## 2016-01-06 ENCOUNTER — Ambulatory Visit
Admission: RE | Admit: 2016-01-06 | Discharge: 2016-01-06 | Disposition: A | Discharge status: Home / Self Care | Payer: Managed Care, Other (non HMO) | Source: Ambulatory Visit | Attending: Family Medicine | Admitting: Family Medicine

## 2016-01-06 DIAGNOSIS — E049 Nontoxic goiter, unspecified: Secondary | ICD-10-CM

## 2016-01-06 DIAGNOSIS — M542 Cervicalgia: Secondary | ICD-10-CM

## 2016-01-07 ENCOUNTER — Telehealth: Payer: Self-pay

## 2016-01-07 NOTE — Telephone Encounter (Signed)
Patient aware of results and recommendations. °

## 2016-01-07 NOTE — Telephone Encounter (Signed)
-----   Message from Betty G Martinique, MD sent at 01/07/2016 11:57 AM EDT ----- Thyroid U/S showed small bilateral thyroid nodules , which are not suspicious, so further studies not recommended at this time. No lymphadenopathy either (no enlarged lymph nodes).  Thanks!

## 2016-01-21 ENCOUNTER — Emergency Department (INDEPENDENT_AMBULATORY_CARE_PROVIDER_SITE_OTHER)
Admission: EM | Admit: 2016-01-21 | Discharge: 2016-01-21 | Disposition: A | Discharge status: Home / Self Care | Payer: Managed Care, Other (non HMO) | Source: Home / Self Care | Attending: Family Medicine | Admitting: Family Medicine

## 2016-01-21 ENCOUNTER — Encounter: Payer: Self-pay | Admitting: Emergency Medicine

## 2016-01-21 DIAGNOSIS — R112 Nausea with vomiting, unspecified: Secondary | ICD-10-CM | POA: Diagnosis not present

## 2016-01-21 DIAGNOSIS — R109 Unspecified abdominal pain: Secondary | ICD-10-CM

## 2016-01-21 LAB — POCT URINALYSIS DIP (MANUAL ENTRY)
Bilirubin, UA: NEGATIVE
Glucose, UA: NEGATIVE
Ketones, POC UA: NEGATIVE
LEUKOCYTES UA: NEGATIVE
Nitrite, UA: NEGATIVE
SPEC GRAV UA: 1.02 (ref 1.005–1.03)
Urobilinogen, UA: NEGATIVE (ref 0–1)
pH, UA: 7 (ref 5–8)

## 2016-01-21 LAB — POCT URINE PREGNANCY: PREG TEST UR: NEGATIVE

## 2016-01-21 MED ORDER — MECLIZINE HCL 25 MG PO TABS: 25.0000 mg | ORAL_TABLET | Freq: Three times a day (TID) | ORAL | Status: DC | PRN

## 2016-01-21 MED ORDER — TAMSULOSIN HCL 0.4 MG PO CAPS: 0.4000 mg | ORAL_CAPSULE | Freq: Every day | ORAL | Status: DC

## 2016-01-21 NOTE — ED Notes (Signed)
Pt c/o N+V, low back pain and mild pelvic pain x3 days. Denies urinary sxs.

## 2016-01-21 NOTE — Discharge Instructions (Signed)
Increase fluid intake. °If symptoms become significantly worse during the night or over the weekend, proceed to the local emergency room.  °

## 2016-01-21 NOTE — ED Provider Notes (Signed)
CSN: [id number]     Arrival date & time 01/21/16  1430 History   First MD Initiated Contact with Patient 01/21/16 1532     Chief Complaint  Patient presents with  . Nausea      HPI Comments: Patient complains of 3 day history of nausea with 3 episodes vomiting, low back ache, mild pelvic pain, and dark urine.  She developed mild right flank pain today.   She has a history of kidney stones (calcium oxalate).  She has had good response in past to Flomax. She is concerned that she may be pregnant.  Patient's last menstrual period was 12/30/2015 (exact date).  No vaginal bleeding or discharge.  The history is provided by the patient.    Past Medical History  Diagnosis Date  . Migraine   . Anxiety   . History of nephrolithiasis   . Depression   . Kidney stone    Past Surgical History  Procedure Laterality Date  . Kidney stone surgery  1997, 2004  . Cholecystectomy  2014   Family History  Problem Relation Age of Onset  . Arthritis    . Diabetes    . Hypertension    . Lung cancer    . Colon cancer    . Breast cancer     Social History  Substance Use Topics  . Smoking status: Never Smoker   . Smokeless tobacco: Never Used  . Alcohol Use: No   OB History    Gravida Para Term Preterm AB TAB SAB Ectopic Multiple Living   1 0 0 0 1 0 1 0 0 0       Obstetric Comments   Age first period 66     Review of Systems  Constitutional: Positive for activity change. Negative for fever, chills, diaphoresis and fatigue.  HENT: Negative.   Eyes: Negative.   Respiratory: Negative.   Cardiovascular: Negative.   Gastrointestinal: Positive for nausea and vomiting. Negative for abdominal pain, diarrhea and abdominal distention.  Genitourinary: Positive for flank pain, decreased urine volume and pelvic pain. Negative for dysuria, urgency, frequency, hematuria, vaginal bleeding, vaginal discharge, genital sores, vaginal pain and menstrual problem.  Musculoskeletal: Negative.   Skin:  Negative.   Neurological: Negative for headaches.    Allergies  Penicillins  Home Medications   Prior to Admission medications   Medication Sig Start Date End Date Taking? Authorizing Provider  acetaminophen (TYLENOL) 500 MG tablet Take 500 mg by mouth every 6 (six) hours as needed for mild pain. Reported on 12/30/2015    Historical Provider, MD  clonazePAM (KLONOPIN) 1 MG tablet Take 1 tablet (1 mg total) by mouth daily as needed for anxiety. 08/05/15   Dorena Cookey, MD  meclizine (ANTIVERT) 25 MG tablet Take 1 tablet (25 mg total) by mouth 3 (three) times daily as needed for dizziness. 01/21/16   Kandra Nicolas, MD  ondansetron (ZOFRAN) 4 MG tablet Take 4 mg by mouth every 8 (eight) hours as needed for nausea or vomiting. Reported on 12/30/2015    Historical Provider, MD  tamsulosin (FLOMAX) 0.4 MG CAPS capsule Take 1 capsule (0.4 mg total) by mouth daily after supper. 01/21/16   Kandra Nicolas, MD   Meds Ordered and Administered this Visit  Medications - No data to display  BP 116/78 mmHg  Pulse 80  Temp(Src) 98.1 F (36.7 C) (Oral)  Wt 179 lb (81.194 kg)  SpO2 98%  LMP 12/30/2015 (Exact Date) No data found.   Physical Exam Nursing  notes and Vital Signs reviewed. Appearance:  Patient appears stated age, and in no acute distress Eyes:  Pupils are equal, round, and reactive to light and accomodation.  Extraocular movement is intact.  Conjunctivae are not inflamed  Nose:  Normal Pharynx:  Normal Neck:  Supple.  No adenopathy Lungs:  Clear to auscultation.  Breath sounds are equal.  Moving air well. Heart:  Regular rate and rhythm without murmurs, rubs, or gallops.  Abdomen:  Nontender without masses or hepatosplenomegaly.  Bowel sounds are present.  Mild right flank tenderness present.  Extremities:  No edema.  Skin:  No rash present.   ED Course  Procedures none    Labs Reviewed  POCT URINALYSIS DIP (MANUAL ENTRY) - Abnormal; Notable for the following:    Blood, UA  trace-intact (*)    Protein Ur, POC trace (*)    All other components within normal limits  URINE CULTURE  HCG, QUANTITATIVE, PREGNANCY  POCT URINE PREGNANCY negative     MDM   1. Nausea and vomiting, intractability of vomiting not specified, unspecified vomiting type   2. Right flank pain    Patient concerned for possible early pregnancy.  Check quantitative HCG; if negative, begin Flomax.  Rx for Antivert prn. Urine culture pending. Increase fluid intake. If symptoms become significantly worse during the night or over the weekend, proceed to the local emergency room.  Followup with Family Doctor if not improved in 3 days.    Kandra Nicolas, MD 01/28/16 [phone]

## 2016-01-22 ENCOUNTER — Telehealth: Payer: Self-pay | Admitting: *Deleted

## 2016-01-22 LAB — HCG, QUANTITATIVE, PREGNANCY

## 2016-01-23 LAB — URINE CULTURE: Colony Count: >=100000

## 2016-03-26 ENCOUNTER — Telehealth: Payer: Self-pay | Admitting: Adult Health

## 2016-03-26 ENCOUNTER — Encounter: Payer: Self-pay | Admitting: Adult Health

## 2016-03-26 ENCOUNTER — Ambulatory Visit (INDEPENDENT_AMBULATORY_CARE_PROVIDER_SITE_OTHER): Payer: Managed Care, Other (non HMO) | Admitting: Adult Health

## 2016-03-26 VITALS — BP 110/78 | Temp 99.2°F | Ht 68.0 in | Wt 180.7 lb

## 2016-03-26 DIAGNOSIS — R5383 Other fatigue: Secondary | ICD-10-CM

## 2016-03-26 DIAGNOSIS — F329 Major depressive disorder, single episode, unspecified: Secondary | ICD-10-CM | POA: Diagnosis not present

## 2016-03-26 DIAGNOSIS — R1084 Generalized abdominal pain: Secondary | ICD-10-CM

## 2016-03-26 DIAGNOSIS — F32A Depression, unspecified: Secondary | ICD-10-CM

## 2016-03-26 LAB — TSH: TSH: 1.61 u[IU]/mL (ref 0.35–4.50)

## 2016-03-26 LAB — POCT URINALYSIS DIPSTICK
Bilirubin, UA: NEGATIVE
Glucose, UA: NEGATIVE
KETONES UA: NEGATIVE
Leukocytes, UA: NEGATIVE
Nitrite, UA: NEGATIVE
PH UA: 6
PROTEIN UA: NEGATIVE
RBC UA: NEGATIVE
UROBILINOGEN UA: 0.2

## 2016-03-26 LAB — BASIC METABOLIC PANEL
BUN: 12 mg/dL (ref 6–23)
CALCIUM: 9.4 mg/dL (ref 8.4–10.5)
CHLORIDE: 106 meq/L (ref 96–112)
CO2: 26 meq/L (ref 19–32)
Creatinine, Ser: 0.69 mg/dL (ref 0.40–1.20)
GFR: 106.86 mL/min (ref >60.00)
Glucose, Bld: 107 mg/dL — ABNORMAL HIGH (ref 70–99)
Potassium: 3.9 mEq/L (ref 3.5–5.1)
SODIUM: 138 meq/L (ref 135–145)

## 2016-03-26 LAB — CBC WITH DIFFERENTIAL/PLATELET
BASOS ABS: 0 10*3/uL (ref 0.0–0.1)
Basophils Relative: 0.3 % (ref 0.0–3.0)
Eosinophils Absolute: 0 10*3/uL (ref 0.0–0.7)
Eosinophils Relative: 0.3 % (ref 0.0–5.0)
HCT: 39.8 % (ref 36.0–46.0)
Hemoglobin: 13.4 g/dL (ref 12.0–15.0)
LYMPHS ABS: 1.1 10*3/uL (ref 0.7–4.0)
Lymphocytes Relative: 11.7 % — ABNORMAL LOW (ref 12.0–46.0)
MCHC: 33.8 g/dL (ref 30.0–36.0)
MCV: 90.3 fl (ref 78.0–100.0)
MONO ABS: 0.5 10*3/uL (ref 0.1–1.0)
Monocytes Relative: 5.5 % (ref 3.0–12.0)
NEUTROS ABS: 7.5 10*3/uL (ref 1.4–7.7)
NEUTROS PCT: 82.2 % — AB (ref 43.0–77.0)
PLATELETS: 301 10*3/uL (ref 150.0–400.0)
RBC: 4.4 Mil/uL (ref 3.87–5.11)
RDW: 12.9 % (ref 11.5–15.5)
WBC: 9.1 10*3/uL (ref 4.0–10.5)

## 2016-03-26 LAB — POCT URINE PREGNANCY: PREG TEST UR: NEGATIVE

## 2016-03-26 MED ORDER — FLUOXETINE HCL 20 MG PO TABS: 20.0000 mg | ORAL_TABLET | Freq: Every day | ORAL | 3 refills | Status: DC

## 2016-03-26 NOTE — Patient Instructions (Signed)
It was great seeing you again today   I will follow up with you regarding your blood work and then we will make a plan from there.   Please let me know if you need anything.

## 2016-03-26 NOTE — Telephone Encounter (Signed)
Spoke with patient and informed her of her labs. TSH is normal.   I am going to send in Prozac 20 mg for depression. She will follow up in one month

## 2016-03-26 NOTE — Progress Notes (Signed)
Subjective:    Patient ID: Brandy [name], female    DOB: Mar 09, 1986, 30 y.o.   MRN: [id number]  HPI  30 year old female who presents to the office today with multiple complaints.   1.2-3 weeks ago had urinary frequency and pressure " I was peeing 20 times a day". That resolved for a few days and then has returned. She complains more now of pressure and the feeling of " my bladder won't relax." She does drink coffee and soda throughout the day.    2. She has been having periods of diaphoresis and subjective fever with generalized fatigue she first noticed this 2-3 days ago. She denies any vomiting or diarrhea but does have some nausea from time to time.   3. She had a miscarriage in November and feels as though since that time " I have been all over the place. I am feeling depressed and sad most days." she has been on Prozac in the past for depression and feels as though this helped out tremendously. She would like to go back on it.   Review of Systems  Constitutional: Positive for activity change, chills, diaphoresis, fatigue and fever. Negative for appetite change and unexpected weight change.  HENT: Negative.   Eyes: Negative.   Respiratory: Negative.   Cardiovascular: Negative.   Gastrointestinal: Positive for abdominal pain and nausea. Negative for constipation, diarrhea, rectal pain and vomiting.  Genitourinary: Positive for frequency, pelvic pain and urgency. Negative for dysuria and hematuria.  Musculoskeletal: Negative.   Neurological: Negative.   Psychiatric/Behavioral: Positive for decreased concentration. Negative for suicidal ideas.       Depression and crying   All other systems reviewed and are negative.      Objective:   Physical Exam  Constitutional: She is oriented to person, place, and time. She appears well-developed and well-nourished. No distress.  HENT:  Head: Normocephalic and atraumatic.  Right Ear: External ear normal.  Left Ear: External ear  normal.  Nose: Nose normal.  Mouth/Throat: Oropharynx is clear and moist. No oropharyngeal exudate.  Eyes: Conjunctivae and EOM are normal. Pupils are equal, round, and reactive to light. Right eye exhibits no discharge. Left eye exhibits no discharge. No scleral icterus.  Neck: Normal range of motion. Neck supple. No thyromegaly present.  Cardiovascular: Normal rate, regular rhythm, normal heart sounds and intact distal pulses.  Exam reveals no gallop and no friction rub.   No murmur heard. Pulmonary/Chest: Effort normal and breath sounds normal. No respiratory distress. She has no wheezes. She has no rales. She exhibits no tenderness.  Abdominal: Soft. Bowel sounds are normal. She exhibits no distension and no mass. There is no tenderness. There is no rebound and no guarding.  Musculoskeletal: Normal range of motion. She exhibits no edema, tenderness or deformity.  Lymphadenopathy:       Head (right side): Submental and tonsillar adenopathy present.       Head (left side): Submental and tonsillar adenopathy present.    She has cervical adenopathy.  Neurological: She is alert and oriented to person, place, and time.  Skin: Skin is warm and dry. No rash noted. She is not diaphoretic. No erythema. No pallor.  Psychiatric: She has a normal mood and affect. Her behavior is normal. Judgment and thought content normal.  Nursing note and vitals reviewed.      Assessment & Plan:  1. Generalized abdominal pain - Appears to be more of bladder spasms? Possibly from caffeine. Advised to stop drinking soda  and coffee for the next week to see if symptoms improve  - POCT urine pregnancy- negative - POCT urinalysis dipstick- Negative  - CBC with Differential/Platelet - Basic metabolic panel  2. Other fatigue - Possibly related to viral illness, abnormal thyroid, or depression  - TSH - CBC with Differential/Platelet - Basic metabolic panel - Consider synthroid   3. Depression - TSH - CBC with  Differential/Platelet - Basic metabolic panel - Is TSH is normal will provide patient with Prozac.   Dorothyann Peng, NP

## 2016-04-16 ENCOUNTER — Encounter (HOSPITAL_BASED_OUTPATIENT_CLINIC_OR_DEPARTMENT_OTHER): Payer: Self-pay | Admitting: *Deleted

## 2016-04-16 ENCOUNTER — Emergency Department (HOSPITAL_BASED_OUTPATIENT_CLINIC_OR_DEPARTMENT_OTHER)
Admission: EM | Admit: 2016-04-16 | Discharge: 2016-04-16 | Disposition: A | Discharge status: Home / Self Care | Payer: Managed Care, Other (non HMO) | Attending: Emergency Medicine | Admitting: Emergency Medicine

## 2016-04-16 ENCOUNTER — Emergency Department (HOSPITAL_BASED_OUTPATIENT_CLINIC_OR_DEPARTMENT_OTHER): Payer: Managed Care, Other (non HMO)

## 2016-04-16 DIAGNOSIS — R61 Generalized hyperhidrosis: Secondary | ICD-10-CM | POA: Insufficient documentation

## 2016-04-16 DIAGNOSIS — N200 Calculus of kidney: Secondary | ICD-10-CM

## 2016-04-16 DIAGNOSIS — Z79899 Other long term (current) drug therapy: Secondary | ICD-10-CM | POA: Diagnosis not present

## 2016-04-16 DIAGNOSIS — R197 Diarrhea, unspecified: Secondary | ICD-10-CM | POA: Diagnosis not present

## 2016-04-16 DIAGNOSIS — R109 Unspecified abdominal pain: Secondary | ICD-10-CM | POA: Diagnosis present

## 2016-04-16 LAB — CBC WITH DIFFERENTIAL/PLATELET
BASOS ABS: 0 10*3/uL (ref 0.0–0.1)
BASOS PCT: 1 %
EOS PCT: 1 %
Eosinophils Absolute: 0.1 10*3/uL (ref 0.0–0.7)
HCT: 41.1 % (ref 36.0–46.0)
Hemoglobin: 14.1 g/dL (ref 12.0–15.0)
LYMPHS PCT: 22 %
Lymphs Abs: 1.3 10*3/uL (ref 0.7–4.0)
MCH: 31.1 pg (ref 26.0–34.0)
MCHC: 34.3 g/dL (ref 30.0–36.0)
MCV: 90.5 fL (ref 78.0–100.0)
MONO ABS: 0.5 10*3/uL (ref 0.1–1.0)
Monocytes Relative: 9 %
Neutro Abs: 4 10*3/uL (ref 1.7–7.7)
Neutrophils Relative %: 67 %
PLATELETS: 275 10*3/uL (ref 150–400)
RBC: 4.54 MIL/uL (ref 3.87–5.11)
RDW: 12.5 % (ref 11.5–15.5)
WBC: 5.9 10*3/uL (ref 4.0–10.5)

## 2016-04-16 LAB — COMPREHENSIVE METABOLIC PANEL
ALBUMIN: 4.5 g/dL (ref 3.5–5.0)
ALK PHOS: 42 U/L (ref 38–126)
ALT: 21 U/L (ref 14–54)
AST: 17 U/L (ref 15–41)
Anion gap: 7 (ref 5–15)
BILIRUBIN TOTAL: 0.8 mg/dL (ref 0.3–1.2)
BUN: 13 mg/dL (ref 6–20)
CALCIUM: 9.1 mg/dL (ref 8.9–10.3)
CO2: 24 mmol/L (ref 22–32)
Chloride: 108 mmol/L (ref 101–111)
Creatinine, Ser: 0.85 mg/dL (ref 0.44–1.00)
GFR calc Af Amer: >60 mL/min (ref >60)
GFR calc non Af Amer: >60 mL/min (ref >60)
GLUCOSE: 132 mg/dL — AB (ref 65–99)
Potassium: 3.4 mmol/L — ABNORMAL LOW (ref 3.5–5.1)
Sodium: 139 mmol/L (ref 135–145)
TOTAL PROTEIN: 7.4 g/dL (ref 6.5–8.1)

## 2016-04-16 LAB — URINALYSIS, ROUTINE W REFLEX MICROSCOPIC
Bilirubin Urine: NEGATIVE
GLUCOSE, UA: NEGATIVE mg/dL
Hgb urine dipstick: NEGATIVE
Ketones, ur: NEGATIVE mg/dL
Nitrite: NEGATIVE
PH: 6 (ref 5.0–8.0)
Protein, ur: 30 mg/dL — AB
SPECIFIC GRAVITY, URINE: 1.028 (ref 1.005–1.030)

## 2016-04-16 LAB — LIPASE, BLOOD: Lipase: 30 U/L (ref 11–51)

## 2016-04-16 LAB — URINE MICROSCOPIC-ADD ON

## 2016-04-16 LAB — PREGNANCY, URINE: Preg Test, Ur: NEGATIVE

## 2016-04-16 MED ORDER — ONDANSETRON 4 MG PO TBDP: 4.0000 mg | ORAL_TABLET | Freq: Three times a day (TID) | ORAL | 0 refills | Status: DC | PRN

## 2016-04-16 MED ORDER — ONDANSETRON HCL 4 MG/2ML IJ SOLN
4.0000 mg | Freq: Once | INTRAMUSCULAR | Status: AC
  Administered 2016-04-16: 4 mg via INTRAVENOUS
  Filled 2016-04-16: qty 2

## 2016-04-16 MED ORDER — HYDROMORPHONE HCL 1 MG/ML IJ SOLN
0.5000 mg | Freq: Once | INTRAMUSCULAR | Status: AC
  Administered 2016-04-16: 0.5 mg via INTRAVENOUS
  Filled 2016-04-16: qty 1

## 2016-04-16 MED ORDER — KETOROLAC TROMETHAMINE 30 MG/ML IJ SOLN
30.0000 mg | Freq: Once | INTRAMUSCULAR | Status: AC
  Administered 2016-04-16: 30 mg via INTRAVENOUS
  Filled 2016-04-16: qty 1

## 2016-04-16 MED ORDER — TAMSULOSIN HCL 0.4 MG PO CAPS: 0.4000 mg | ORAL_CAPSULE | Freq: Every day | ORAL | 0 refills | Status: DC

## 2016-04-16 MED ORDER — SODIUM CHLORIDE 0.9 % IV BOLUS (SEPSIS)
1000.0000 mL | Freq: Once | INTRAVENOUS | Status: AC
  Administered 2016-04-16: 1000 mL via INTRAVENOUS

## 2016-04-16 MED ORDER — PROMETHAZINE HCL 12.5 MG PO TABS: 12.5000 mg | ORAL_TABLET | Freq: Four times a day (QID) | ORAL | 0 refills | Status: DC | PRN

## 2016-04-16 MED ORDER — HYDROMORPHONE HCL 1 MG/ML IJ SOLN
1.0000 mg | Freq: Once | INTRAMUSCULAR | Status: AC
  Administered 2016-04-16: 1 mg via INTRAVENOUS
  Filled 2016-04-16: qty 1

## 2016-04-16 MED ORDER — TAMSULOSIN HCL 0.4 MG PO CAPS
0.4000 mg | ORAL_CAPSULE | Freq: Once | ORAL | Status: AC
  Administered 2016-04-16: 0.4 mg via ORAL
  Filled 2016-04-16: qty 1

## 2016-04-16 MED ORDER — OXYCODONE-ACETAMINOPHEN 5-325 MG PO TABS: 1.0000 | ORAL_TABLET | Freq: Four times a day (QID) | ORAL | 0 refills | Status: DC | PRN

## 2016-04-16 MED ORDER — PROMETHAZINE HCL 25 MG/ML IJ SOLN
12.5000 mg | Freq: Once | INTRAMUSCULAR | Status: AC
  Administered 2016-04-16: 12.5 mg via INTRAVENOUS
  Filled 2016-04-16: qty 1

## 2016-04-16 MED ORDER — SODIUM CHLORIDE 0.9 % IV BOLUS (SEPSIS)
500.0000 mL | Freq: Once | INTRAVENOUS | Status: AC
  Administered 2016-04-16: 500 mL via INTRAVENOUS

## 2016-04-16 MED ORDER — TAMSULOSIN HCL 0.4 MG PO CAPS
ORAL_CAPSULE | ORAL | Status: AC
  Filled 2016-04-16: qty 1

## 2016-04-16 MED ORDER — METOCLOPRAMIDE HCL 5 MG/ML IJ SOLN
5.0000 mg | Freq: Once | INTRAMUSCULAR | Status: AC
  Administered 2016-04-16: 5 mg via INTRAVENOUS
  Filled 2016-04-16: qty 2

## 2016-04-16 MED ORDER — OXYCODONE-ACETAMINOPHEN 5-325 MG PO TABS
1.0000 | ORAL_TABLET | Freq: Once | ORAL | Status: DC
  Filled 2016-04-16: qty 1

## 2016-04-16 NOTE — Discharge Instructions (Signed)
Read the information below.  Your ultrasound was concerning for nephrolithiasis. You can take motrin 400mg  every 6hrs for pain relief. I have prescribed percocet for severe pain. I have prescribed zofran for nausea. Take tamsulosin daily until stone passes. Be sure to strain your urine.  Stay hydrated.  Use the prescribed medication as directed.  Please discuss all new medications with your pharmacist.   Please call your urologist and follow up in office tomorrow.  You may return to the Emergency Department at any time for worsening condition or any new symptoms that concern you. Return to ED if you develop fever, inability to keep fluids down, or unable to control pain at home.

## 2016-04-16 NOTE — ED Notes (Signed)
Pt given water for PO challenge at this time.  

## 2016-04-16 NOTE — ED Triage Notes (Signed)
Left flank pain, sudden onset, hx of kidney stones

## 2016-04-16 NOTE — ED Notes (Signed)
Patient transported to Ultrasound 

## 2016-04-16 NOTE — ED Provider Notes (Signed)
[facility] DEPT [facility] Provider Note   CSN: [id number] Arrival date & time: 04/16/16  [id number]     History   Chief Complaint Chief Complaint  Patient presents with  . Flank Pain    HPI Brandy [name] is a 30 y.o. female.  Brandy [name] is a 30 y.o. Female with history of anxiety, depression, nephrolithiasis, migraines presents to ED with complaint of left-sided flank pain. She is followed by [facility] regarding her history of kidney stones. She reports sudden onset of cramping 10/10 left flank pain this morning. She has associated diaphoresis, decreased urine, non-bloody diarrhea, and nausea. She denies fever, dysuria, hematuria, abdominal pain, vomiting, blood in stool, vaginal pain, pelvic pain, vaginal discharge, vaginal bleeding. She is sexually active with 1 female partner, husband. Last menstrual period 04/02/2016. Patient has an extensive history of nephrolithiasis and is followed by [facility]. She reports she was recently seen for flank pain and hematuria, a KUB was negative at that time.      Past Medical History:  Diagnosis Date  . Anxiety   . Depression   . History of nephrolithiasis   . Kidney stone   . Migraine     Patient Active Problem List   Diagnosis Date Noted  . Anterior neck pain 12/30/2015  . Enlarged thyroid gland 12/30/2015  . Fatigue 12/30/2015  . Routine general medical examination at a health care facility 09/27/2014  . Tendinitis of right ankle 08/14/2014  . Nausea vomiting and diarrhea 12/28/2013  . Serum calcium elevated 12/28/2013  . 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 DUB (dysfunctional uterine bleeding) 06/04/2011  . GERD (gastroesophageal reflux disease) 02/19/2011  . Depression 09/06/2008  . NEPHROLITHIASIS, HX OF 12/16/2007    Past Surgical History:  Procedure Laterality Date  . CHOLECYSTECTOMY  2014  . [place], 2004    OB History    Gravida Para Term Preterm AB Living   1 0 0 0 1 0   SAB TAB Ectopic Multiple Live Births   1 0 0 0        Obstetric Comments   Age first period 65       Home Medications    Prior to Admission medications   Medication Sig Start Date End Date Taking? Authorizing Provider  clonazePAM (KLONOPIN) 1 MG tablet Take 1 tablet (1 mg total) by mouth daily as needed for anxiety. 08/05/15  Yes Dorena Cookey, MD  acetaminophen (TYLENOL) 500 MG tablet Take 500 mg by mouth every 6 (six) hours as needed for mild pain. Reported on 12/30/2015    Historical Provider, MD  FLUoxetine (PROZAC) 20 MG tablet Take 1 tablet (20 mg total) by mouth daily. 03/26/16   Dorothyann Peng, NP  meclizine (ANTIVERT) 25 MG tablet Take 1 tablet (25 mg total) by mouth 3 (three) times daily as needed for dizziness. 01/21/16   Kandra Nicolas, MD  ondansetron (ZOFRAN) 4 MG tablet Take 4 mg by mouth every 8 (eight) hours as needed for nausea or vomiting. Reported on 12/30/2015    Historical Provider, MD  oxyCODONE-acetaminophen (PERCOCET/ROXICET) 5-325 MG tablet Take 1 tablet by mouth every 6 (six) hours as needed for severe pain. 04/16/16   Roxanna Mew, PA-C  promethazine (PHENERGAN) 12.5 MG tablet Take 1 tablet (12.5 mg total) by mouth every 6 (six) hours as needed for nausea or vomiting. 04/16/16   Roxanna Mew, PA-C  tamsulosin (FLOMAX) 0.4 MG CAPS capsule Take 1 capsule (0.4 mg total) by mouth  daily. 04/16/16   Roxanna Mew, PA-C    Family History Family History  Problem Relation Age of Onset  . Arthritis    . Diabetes    . Hypertension    . Lung cancer    . Colon cancer    . Breast cancer      Social History Social History  Substance Use Topics  . Smoking status: Never Smoker  . Smokeless tobacco: Never Used  . Alcohol use No     Allergies   Penicillins   Review of Systems Review of Systems  Constitutional: Positive for diaphoresis.  Gastrointestinal: Positive for diarrhea ( x 1) and nausea.  Genitourinary: Positive for decreased urine volume  and flank pain.  All other systems reviewed and are negative.    Physical Exam Updated Vital Signs BP 114/93 (BP Location: Left Arm)   Pulse 120   Temp 98.4 F (36.9 C) (Oral)   Resp 20   Ht 5\' 9"  (1.753 m)   Wt 79.4 kg   LMP 04/02/2016   SpO2 100%   BMI 25.84 kg/m   Physical Exam  Constitutional: She appears well-developed and well-nourished. No distress.  HENT:  Head: Normocephalic and atraumatic.  Mouth/Throat: Oropharynx is clear and moist. No oropharyngeal exudate.  Eyes: Conjunctivae and EOM are normal. Pupils are equal, round, and reactive to light. Right eye exhibits no discharge. Left eye exhibits no discharge. No scleral icterus.  Neck: Normal range of motion. Neck supple.  Cardiovascular: Normal rate, regular rhythm, normal heart sounds and intact distal pulses.   No murmur heard. Pulmonary/Chest: Effort normal and breath sounds normal. No respiratory distress.  Abdominal: Soft. Bowel sounds are normal. She exhibits no distension. There is no tenderness. There is CVA tenderness ( mild left). There is no rebound and no guarding.  Musculoskeletal: Normal range of motion.  Lymphadenopathy:    She has no cervical adenopathy.  Neurological: She is alert. Coordination normal.  Skin: Skin is warm and dry. She is not diaphoretic.  Psychiatric: She has a normal mood and affect.     ED Treatments / Results  Labs (all labs ordered are listed, but only abnormal results are displayed) Labs Reviewed  URINALYSIS, ROUTINE W REFLEX MICROSCOPIC (NOT AT [facility]) - Abnormal; Notable for the following:       Result Value   APPearance TURBID (*)    Protein, ur 30 (*)    Leukocytes, UA MODERATE (*)    All other components within normal limits  COMPREHENSIVE METABOLIC PANEL - Abnormal; Notable for the following:    Potassium 3.4 (*)    Glucose, Bld 132 (*)    All other components within normal limits  URINE MICROSCOPIC-ADD ON - Abnormal; Notable for the following:    Squamous  Epithelial / LPF 0-5 (*)    Bacteria, UA MANY (*)    All other components within normal limits  PREGNANCY, URINE  CBC WITH DIFFERENTIAL/PLATELET  LIPASE, BLOOD    EKG  EKG Interpretation None       Radiology US Renal  Result Date: 04/16/2016 CLINICAL DATA:  Severe left flank pain radiating to left lower quadrant with nausea and vomiting today. History of multiple renal stones, cholecystectomy. EXAM: RENAL / URINARY TRACT ULTRASOUND COMPLETE COMPARISON:  CT abdomen dated 03/27/2015. FINDINGS: Right Kidney: Length: 10.8 cm. Cortical echogenicity and thickness is normal. Scattered small echogenic foci within the renal pelvis, compatible with the renal stones seen on earlier CT. No hydronephrosis. Left Kidney: Length: 11 cm. Cortical echogenicity  and thickness is normal. Mild hydronephrosis. Scattered small echogenic foci within the renal pelvis, compatible with the renal stones seen on earlier CT, largest measuring 5 mm. Bladder: Appears normal for degree of bladder distention. Both distal ureters shown to be patent at the level of the bladder (bilateral ureteral jets visualized). IMPRESSION: 1. Bilateral renal stones, left greater than right. 2. Mild left-sided hydronephrosis. This is suspicious for ureteral stone. Electronically Signed   By: Franki Cabot M.D.   On: 04/16/2016 12:18    Procedures Procedures (including critical care time)  Medications Ordered in ED Medications  oxyCODONE-acetaminophen (PERCOCET/ROXICET) 5-325 MG per tablet 1 tablet (0 tablets Oral Hold 04/16/16 1300)  tamsulosin (FLOMAX) 0.4 MG capsule (not administered)  sodium chloride 0.9 % bolus 1,000 mL (0 mLs Intravenous Stopped 04/16/16 1124)  ondansetron (ZOFRAN) injection 4 mg (4 mg Intravenous Given 04/16/16 1005)  HYDROmorphone (DILAUDID) injection 0.5 mg (0.5 mg Intravenous Given 04/16/16 1006)  HYDROmorphone (DILAUDID) injection 1 mg (1 mg Intravenous Given 04/16/16 1037)  ondansetron (ZOFRAN) injection 4 mg (4  mg Intravenous Given 04/16/16 1035)  ketorolac (TORADOL) 30 MG/ML injection 30 mg (30 mg Intravenous Given 04/16/16 1156)  HYDROmorphone (DILAUDID) injection 0.5 mg (0.5 mg Intravenous Given 04/16/16 1323)  metoCLOPramide (REGLAN) injection 5 mg (5 mg Intravenous Given 04/16/16 1322)  tamsulosin (FLOMAX) capsule 0.4 mg (0.4 mg Oral Given 04/16/16 1418)  promethazine (PHENERGAN) injection 12.5 mg (12.5 mg Intravenous Given 04/16/16 1448)  sodium chloride 0.9 % bolus 500 mL (0 mLs Intravenous Stopped 04/16/16 1713)     Initial Impression / Assessment and Plan / ED Course  I have reviewed the triage vital signs and the nursing notes.  Pertinent labs & imaging results that were available during my care of the patient were reviewed by me and considered in my medical decision making (see chart for details).  Clinical Course  Value Comment By Time  US Renal Reviewed Roxanna Mew, PA-C 08/17 1424   Patient endorses improvement in pain.  Laqueshia Reish, [place] 08/17 1427   Patient passed PO challenge Jenniferlee Tremont, PA-C 08/17 1700    Patient is afebrile and appears in distress. She is tachycardic. Physical exam remarkable for mild CVA tenderness. Her abdomen is soft and nontender. Concern for possible nephrolithiasis. Will obtain labs.   CMP reassuring. Lipase is normal, low suspicion for acute pancreatitis. UA negative for hematuria. UA remarkable for leukocytes and bacteria and white blood cells, concern for possible ?UTI vs ?pyelonephritis. Patient has had multiple CT scans of her abdomen and pelvis. Given in her young age and history of nephrolithiasis, will ultrasound kidneys for further evaluation.  [place] of kidneys remarkable for b/l nephrolithiasis and mild hydronephrosis of left Kidney suspicious for ureteral nephrolithiasis. Given presence of bacteria and WBCs in urine will consult urology regarding concern for possible infected stone.  Dr. Rex Kras spoke with Urology, greatly  appreciated their time and input. Do not recommend antibiotics at this time. Recommend pain medication, tamsulosin, and follow up outpatient with urology.  Discussed results and plan with patient. Encouraged follow-up with urology in the next 1-2 days. Discussed importance of maintaining hydration. Strain urine. Motrin for mild to moderate pain. Rx pain medication, tamsulosin, Phenergan. Review of [place] controlled substance database shows no recent narcotic prescriptions. Return precautions provided. Patient voiced understanding and is agreeable.  Final Clinical Impressions(s) / ED Diagnoses   Final diagnoses:  Nephrolithiasis    New Prescriptions Discharge Medication List as of 04/16/2016  2:23 PM  START taking these medications   Details  oxyCODONE-acetaminophen (PERCOCET/ROXICET) 5-325 MG tablet Take 1 tablet by mouth every 6 (six) hours as needed for severe pain., Starting Thu 04/16/2016, Print    ondansetron (ZOFRAN ODT) 4 MG disintegrating tablet Take 1 tablet (4 mg total) by mouth every 8 (eight) hours as needed for nausea or vomiting., Starting Thu 04/16/2016, Print         Roxanna Mew, [place] 04/16/16 [place], MD 04/17/16 0700

## 2016-04-16 NOTE — ED Notes (Signed)
Pt stating she is vomiting again. Percocet held and PA notified. See new orders.

## 2016-04-16 NOTE — ED Notes (Signed)
PA at bedside.

## 2016-04-16 NOTE — ED Notes (Signed)
Pt vomiting. MD and Quita Skye, RN made aware

## 2016-04-16 NOTE — ED Notes (Signed)
Pt returned from ultrasound

## 2016-04-17 ENCOUNTER — Encounter (HOSPITAL_COMMUNITY): Payer: Self-pay

## 2016-04-17 ENCOUNTER — Other Ambulatory Visit: Payer: Self-pay | Admitting: Urology

## 2016-04-17 NOTE — H&P (Signed)
CC: I have kidney stones.  HPI: Brandy [name] is a 30 year-old female established patient who is here for renal calculi.  Seen in the ED yesterday for renal colic. U/S noted some hydronephrosis on imaging but no obstructing stone. Seen previously here on 8/8 for similar symptoms. KUB was inconclusive.   Presents this morning with severe colic pain, n/v. There are associated increase in LUTS but she is able to continue voiding. Denies hematuria or fevers.   The problem is on the left side. This is not her first kidney stone. She is currently having flank pain, back pain, and nausea. She denies having groin pain, vomiting, fever, and chills. She has not caught a stone in her urine strainer since her symptoms began.   She has had ureteral stent and ureteroscopy for treatment of her stones in the past.     ALLERGIES: Penicillins - Skin Rash    MEDICATIONS: Tamsulosin Hcl 0.4 mg capsule, ext release 24 hr 1 capsule PO Q PM  Klonopin 0.5 mg tablet 1 tablet PRN  Tramadol Hcl 50 mg tablet 1 tablet PO Q 6 H PRN  Tylenol     GU PSH: Cystoscopy Insert Stent - 2010 Cystoscopy Ureteroscopy - 2010      PSH Notes: Cholecystectomy, Cystoscopy With Insertion Of Ureteral Stent Right, Cystoscopy With Ureteroscopy Right, Ear Surgery   NON-GU PSH: Cholecystectomy - 2014          GU PMH: Flank Pain (Acute), Right, No definitive bilateral renal or ureteral calculus noted. She could have passed stone. If pain persists or worsens may need CT urogram. - 04/07/2016 Gross hematuria (Worsening, Acute), No definitive bilateral renal or ureteral calculus noted. She could have passed stone. If pain persists or worsens may need CT urogram. - 04/07/2016 Kidney Stone (Worsening, Chronic), Bilateral, No definitive bilateral renal or ureteral calculus noted. She could have passed stone. If pain persists or worsens may need CT urogram. - 04/07/2016, Nephrolithiasis, - 2014 Abdominal Pain Unspec, Right flank pain -  03/27/2015 Dorsalgia, Unspec, Backache - 2014 Nocturia, Nocturia - 2014 Urinary Frequency, Increased urinary frequency - 2014     NON-GU PMH: Encounter for general adult medical examination without abnormal findings, Encounter for preventive health examination - 03/27/2015 Nausea with vomiting, unspecified, Nausea and/or vomiting - 03/27/2015 Anxiety disorder, unspecified, Anxiety - 2014 Fever, unspecified, Fever - 2014 Tachycardia, unspecified, Tachycardia - 2014     FAMILY HISTORY: Family Health Status Number - Runs In Family nephrolithiasis - Mother    SOCIAL HISTORY: Marital Status: Married Current Smoking Status: Patient has never smoked.  Has never drank.  Drinks 3 caffeinated drinks per day.      Notes: Never smoker, Occupation:, Marital History - Single, Tobacco Use, Alcohol Use, Caffeine Use    REVIEW OF SYSTEMS:      GU Review Female:  Patient reports burning /pain with urination, stream starts and stops, trouble starting your stream, and have to strain to urinate. Patient denies frequent urination, hard to postpone urination, get up at night to urinate, leakage of urine, and currently pregnant.     Gastrointestinal (Upper):  Patient reports nausea and vomiting. Patient denies indigestion/ heartburn.     Gastrointestinal (Lower):  Patient denies diarrhea and constipation.     Constitutional:  Patient denies fever, night sweats, weight loss, and fatigue.     Skin:  Patient denies skin rash/ lesion and itching.     Eyes:  Patient denies blurred vision and double vision.     Ears/  Nose/ Throat:  Patient denies sore throat and sinus problems.     Hematologic/Lymphatic:  Patient denies easy bruising and swollen glands.     Cardiovascular:  Patient denies leg swelling and chest pains.     Respiratory:  Patient denies cough and shortness of breath.     Endocrine:  Patient denies excessive thirst.     Musculoskeletal:  Patient denies back pain and joint pain.     Neurological:  Patient  denies headaches and dizziness.     Psychologic:  Patient denies depression and anxiety.     VITAL SIGNS: None      MULTI-SYSTEM PHYSICAL EXAMINATION:       Constitutional: Well-nourished. No physical deformities. Normally developed. Good grooming.      Neck: Neck symmetrical, not swollen. Normal tracheal position.      Respiratory: No labored breathing, no use of accessory muscles. CTA.      Cardiovascular: Normal temperature, normal extremity pulses, no swelling, no varicosities. S1/S2 auscultated. RRR.      Lymphatic: No enlargement of neck, axillae, groin.      Skin: No paleness, no jaundice, no cyanosis. No lesion, no ulcer, no rash.      Neurologic / Psychiatric: Oriented to time, oriented to place, oriented to person. No depression, no anxiety, no agitation.      Gastrointestinal: Palpable left flank pain. No LLQ tenderness.      Eyes: Normal conjunctivae. Normal eyelids.      Ears, Nose, Mouth, and Throat: Left ear no scars, no lesions, no masses. Right ear no scars, no lesions, no masses. Nose no scars, no lesions, no masses. Normal hearing. Normal lips.      Musculoskeletal: Left CVA tenderness of spine, ribs, pelvis. Normal gait and station of head and neck.              PAST DATA REVIEWED:   Source Of History:  Patient  Records Review:  Previous Hospital Records, Previous Patient Records  X-Ray Review: KUB: Reviewed Films.  Renal Ultrasound: Reviewed Films. Reviewed Report.      04/17/16  Urinalysis  Urine Appearance Clear   Urine Specimen Voided   Urine Color Yellow   Urine Glucose Neg   Urine Bilirubin Neg   Urine Ketones Neg   Urine Specific Gravity 1.025   Urine Blood 3+   Urine pH 5.5   Urine Protein Neg   Urine Urobilinogen 0.2   Urine Nitrites Neg   Urine Leukocyte Esterase Trace   Urine WBC/hpf 5-10   Urine RBC/hpf 20-40/hpf   Urine Epithelial Cells 6-10/hpf   Urine Bacteria Many (>50/hpf)   Urine Mucous Present   Urine Yeast NS (Not Seen)   Urine  Trichomonas Not Present   Urine Cystals NS (Not Seen)   Urine Casts NS (Not Seen)   Urine Sperm Not Present    Notes:  Urine sent for c/s   PROCEDURES:    C.T. Urogram - 74176      4-5 mm calculus noted in the proximal left ureter. Mild-moderate hydronephrosis noted. HU between 600-900.     KUB - 74000  A single view of the abdomen is obtained.      Line drawn in PACS to identify stone in left proximal ureter.    Urinalysis w/Scope - 81001  Dipstick Dipstick Cont'd Micro  Specimen: Voided Bilirubin: Neg WBC/hpf: 5-10  Color: Yellow Ketones: Neg RBC/hpf: 20-40/hpf  Appearance: Clear Blood: 3+ Bacteria: Many (>50/hpf)  Specific Gravity: 1.025 Protein: Neg Cystals: NS (Not  Seen)  pH: 5.5 Urobilinogen: 0.2 Casts: NS (Not Seen)  Glucose: Neg Nitrites: Neg Trichomonas: Not Present   Leukocyte Esterase: Trace Mucous: Present    Epithelial Cells: 6-10/hpf    Yeast: NS (Not Seen)    Sperm: Not Present    Ketoralac 60mg  - [id number], [id number]  Qty: 60 Adm. By: Cristobal Goldmann  Unit: mg Lot No [id number]  Route: IM Exp. Date 08/31/2017  Freq: None Mfgr.:   Site: Left Buttock   Notes:  Pt had some moderate relief after ketoralac injection.   ASSESSMENT:     ICD-10 Details  1 GU:  Calculus Ureter - N20.1 Left, Acute   PLAN:   Medications  New Meds: Hydromorphone Hcl 4 mg tablet 1 tablet PO Q 6 H #20 0 Refill(s)    Orders  Labs Urine Culture and Sensitivity  X-Rays: KUB  Schedule  Return Visit: Next Available Appointment - Schedule Surgery  Document  Letter(s):  Created for Patient: Clinical Summary   Notes:  Pt provided additional pain medication. She may continue on tamsulosin and use promethazine as needed. consulted with the on-call urologist and will schedule patient for lithotripsy on Monday morning. We discussed the risks and benefits of the procedure along with expected post procedure recovery course. Patient understands there is always a chance for need of repeat  procedure. She wishes to proceed. She has had a long standing kidney stone history. I believe t would be best to proceed with metabolic evaluation once she has passed his current kidney stone event. Patient understands ED f/u for worsening symptoms with uncontrollable pain, uncontrollable nausea and vomiting, painful inability to void, and fever for over the weekend. She understands she will probably need a ureteral stent at that point. Instructions given to remain well hydrated and nourished.   After a thorough review of the management options for the patient's condition the patient  elected to proceed with surgical therapy as noted above. We have discussed the potential benefits and risks of the procedure, side effects of the proposed treatment, the likelihood of the patient achieving the goals of the procedure, and any potential problems that might occur during the procedure or recuperation. Informed consent has been obtained.

## 2016-04-20 ENCOUNTER — Ambulatory Visit (HOSPITAL_COMMUNITY): Payer: Managed Care, Other (non HMO)

## 2016-04-20 ENCOUNTER — Encounter (HOSPITAL_COMMUNITY): Payer: Self-pay | Admitting: *Deleted

## 2016-04-20 ENCOUNTER — Ambulatory Visit (HOSPITAL_COMMUNITY)
Admission: RE | Admit: 2016-04-20 | Discharge: 2016-04-20 | Disposition: A | Discharge status: Home / Self Care | Payer: Managed Care, Other (non HMO) | Source: Ambulatory Visit | Attending: Urology | Admitting: Urology

## 2016-04-20 ENCOUNTER — Encounter (HOSPITAL_COMMUNITY)
Admission: RE | Disposition: A | Discharge status: Home / Self Care | Payer: Self-pay | Source: Ambulatory Visit | Attending: Urology

## 2016-04-20 DIAGNOSIS — F419 Anxiety disorder, unspecified: Secondary | ICD-10-CM | POA: Insufficient documentation

## 2016-04-20 DIAGNOSIS — Z87442 Personal history of urinary calculi: Secondary | ICD-10-CM | POA: Insufficient documentation

## 2016-04-20 DIAGNOSIS — N132 Hydronephrosis with renal and ureteral calculous obstruction: Secondary | ICD-10-CM | POA: Diagnosis not present

## 2016-04-20 DIAGNOSIS — N201 Calculus of ureter: Secondary | ICD-10-CM

## 2016-04-20 LAB — PREGNANCY, URINE: Preg Test, Ur: NEGATIVE

## 2016-04-20 SURGERY — LITHOTRIPSY, ESWL
Anesthesia: LOCAL | Laterality: Left

## 2016-04-20 MED ORDER — DIPHENHYDRAMINE HCL 25 MG PO CAPS
25.0000 mg | ORAL_CAPSULE | ORAL | Status: AC
  Administered 2016-04-20: 25 mg via ORAL
  Filled 2016-04-20: qty 1

## 2016-04-20 MED ORDER — CIPROFLOXACIN HCL 500 MG PO TABS
500.0000 mg | ORAL_TABLET | ORAL | Status: AC
  Administered 2016-04-20: 500 mg via ORAL
  Filled 2016-04-20: qty 1

## 2016-04-20 MED ORDER — SODIUM CHLORIDE 0.9 % IV SOLN
INTRAVENOUS | Status: DC
  Administered 2016-04-20: 07:00:00 via INTRAVENOUS

## 2016-04-20 MED ORDER — DIAZEPAM 5 MG PO TABS
10.0000 mg | ORAL_TABLET | ORAL | Status: AC
  Administered 2016-04-20: 10 mg via ORAL
  Filled 2016-04-20: qty 2

## 2016-04-20 MED ORDER — OXYCODONE-ACETAMINOPHEN 5-325 MG PO TABS
1.0000 | ORAL_TABLET | Freq: Once | ORAL | Status: AC
  Administered 2016-04-20: 1 via ORAL
  Filled 2016-04-20: qty 1

## 2016-04-20 NOTE — Interval H&P Note (Signed)
History and Physical Interval Note:  04/20/2016 7:19 AM  Brandy [name] Reasons  has presented today for surgery, with the diagnosis of left promoximal ureteral stone  The various methods of treatment have been discussed with the patient and family. After consideration of risks, benefits and other options for treatment, the patient has consented to  Procedure(s): LEFT EXTRACORPOREAL SHOCK WAVE LITHOTRIPSY (ESWL) (Left) as a surgical intervention .  The patient's history has been reviewed, patient examined, no change in status, stable for surgery.  I have reviewed the patient's chart and labs.  Questions were answered to the patient's satisfaction.     [name]

## 2016-04-20 NOTE — Discharge Instructions (Signed)
I have reviewed discharge instructions in detail with the patient. They will follow-up with me or their physician as scheduled. My nurse will also be calling the patients as per protocol.  ° °Moderate Conscious Sedation, Adult, Care After °Refer to this sheet in the next few weeks. These instructions provide you with information on caring for yourself after your procedure. Your health care provider may also give you more specific instructions. Your treatment has been planned according to current medical practices, but problems sometimes occur. Call your health care provider if you have any problems or questions after your procedure. °WHAT TO EXPECT AFTER THE PROCEDURE  °After your procedure: °· You may feel sleepy, clumsy, and have poor balance for several hours. °· Vomiting may occur if you eat too soon after the procedure. °HOME CARE INSTRUCTIONS °· Do not participate in any activities where you could become injured for at least 24 hours. Do not: °¨ Drive. °¨ Swim. °¨ Ride a bicycle. °¨ Operate heavy machinery. °¨ Cook. °¨ Use power tools. °¨ Climb ladders. °¨ Work from a high place. °· Do not make important decisions or sign legal documents until you are improved. °· If you vomit, drink water, juice, or soup when you can drink without vomiting. Make sure you have little or no nausea before eating solid foods. °· Only take over-the-counter or prescription medicines for pain, discomfort, or fever as directed by your health care provider. °· Make sure you and your family fully understand everything about the medicines given to you, including what side effects may occur. °· You should not drink alcohol, take sleeping pills, or take medicines that cause drowsiness for at least 24 hours. °· If you smoke, do not smoke without supervision. °· If you are feeling better, you may resume normal activities 24 hours after you were sedated. °· Keep all appointments with your health care provider. °SEEK MEDICAL CARE IF: °· Your  skin is pale or bluish in color. °· You continue to feel nauseous or vomit. °· Your pain is getting worse and is not helped by medicine. °· You have bleeding or swelling. °· You are still sleepy or feeling clumsy after 24 hours. °SEEK IMMEDIATE MEDICAL CARE IF: °· You develop a rash. °· You have difficulty breathing. °· You develop any type of allergic problem. °· You have a fever. °MAKE SURE YOU: °· Understand these instructions. °· Will watch your condition. °· Will get help right away if you are not doing well or get worse. °  °This information is not intended to replace advice given to you by your health care provider. Make sure you discuss any questions you have with your health care provider. °  °Document Released: 06/07/2013 Document Revised: 09/07/2014 Document Reviewed: 06/07/2013 °[employer]vier Interactive Patient Education ©2016 [employer] ° °

## 2016-06-09 ENCOUNTER — Encounter: Payer: Self-pay | Admitting: Family Medicine

## 2016-06-09 ENCOUNTER — Ambulatory Visit (INDEPENDENT_AMBULATORY_CARE_PROVIDER_SITE_OTHER): Payer: Managed Care, Other (non HMO) | Admitting: Family Medicine

## 2016-06-09 VITALS — BP 110/80 | HR 84 | Temp 98.1°F | Resp 12 | Ht 67.5 in | Wt 181.5 lb

## 2016-06-09 DIAGNOSIS — M791 Myalgia, unspecified site: Secondary | ICD-10-CM

## 2016-06-09 DIAGNOSIS — F411 Generalized anxiety disorder: Secondary | ICD-10-CM | POA: Diagnosis not present

## 2016-06-09 DIAGNOSIS — R739 Hyperglycemia, unspecified: Secondary | ICD-10-CM

## 2016-06-09 DIAGNOSIS — R5382 Chronic fatigue, unspecified: Secondary | ICD-10-CM | POA: Diagnosis not present

## 2016-06-09 HISTORY — DX: Generalized anxiety disorder: F41.1

## 2016-06-09 NOTE — Progress Notes (Signed)
HPI:   [name] is a 30 y.o. female, who is here today to formally establish care with me but I have seen her once before (acute visit).  Former PCP: Dr Sherren Mocha Last preventive routine visit: She follows with her gyn regularly.   Concerns today: myalgias,neck pain, anxiety, "I know something is wrong."   Today she is complaining of new onset of joint and muscle pain that is started on 06/02/2016, at day after she received her flu shot.  She has received her flu shot annually and denies any history of adverse reactions. She describes pain as "all over": lateral aspect of neck, upper and lower extremities, and hand/feet. Intermittent needle sensation on finger tips.  She has checked temp and max 99.2, denies chills.  She is also complaining of fatigue for over 6 months, she also has history of nephrolithiasis and concerned about this is related with parathyroid gland. 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    She tells me that for a while she has been feeling "bad."  I have seeing her a few months ago, 12/30/2015, when she was complaining of neck pain, workup was otherwise negative.  Hx of anxiety, she discontinued Clonazepam about 2 months ago and never started Fluoxetine because she is trying to get pregnant. LMP 05/22/2016.    Lab Results  Component Value Date   CREATININE 0.85 04/16/2016   BUN 13 04/16/2016   NA 139 04/16/2016   K 3.4 (L) 04/16/2016   CL 108 04/16/2016   CO2 24 04/16/2016    Lab Results  Component Value Date   ALT 21 04/16/2016   AST 17 04/16/2016   ALKPHOS 42 04/16/2016   BILITOT 0.8 04/16/2016   Lab Results  Component Value Date   TSH 1.61 03/26/2016   Lab Results  Component Value Date   VITAMINB12 248 10/02/2011   + Heavy menses.  She denies associated hair loss, skin rash, odynophagia, dysphagia, abdominal pain, changes in bowel habits, or vomiting. Occasionally she has nausea.   Review of Systems  Constitutional: Positive for fatigue. Negative for appetite change, chills, fever and unexpected weight change.  HENT: Negative for facial swelling, mouth sores, sore throat, trouble swallowing and voice change.   Eyes: Negative for pain, redness and visual disturbance.  Respiratory: Negative for cough, shortness of breath and wheezing.   Cardiovascular: Negative for chest pain, palpitations and leg swelling.  Gastrointestinal: Positive for nausea (Occasionally). Negative for abdominal pain and vomiting.       Denies changes in bowel habits.  Endocrine: Negative for cold intolerance, heat intolerance, polydipsia, polyphagia and polyuria.  Genitourinary: Negative for difficulty urinating, dysuria, menstrual problem and vaginal bleeding.  Musculoskeletal: Positive for arthralgias, myalgias and neck pain. Negative for gait problem and neck stiffness.  Skin: Negative for color change, rash and wound.  Neurological: Positive for numbness. Negative for syncope, facial asymmetry, weakness and headaches.  Hematological: Negative for adenopathy. Does not bruise/bleed easily.  Psychiatric/Behavioral: Negative for confusion and sleep disturbance. The patient is nervous/anxious.       Current Outpatient Prescriptions on File Prior to Visit  Medication Sig Dispense Refill  . acetaminophen (TYLENOL) 500 MG tablet Take 500 mg by mouth every 6 (six) hours as needed for mild  pain. Reported on 12/30/2015    . tamsulosin (FLOMAX) 0.4 MG CAPS capsule Take 1 capsule (0.4 mg total) by mouth daily. 15 capsule 0   No current facility-administered medications on file prior to visit.      Past Medical History:  Diagnosis Date  . Anxiety   . Depression   . History of nephrolithiasis   .  Kidney stone   . Migraine    Allergies  Allergen Reactions  . Penicillins Hives    Childhood reaction    Family History  Problem Relation Age of Onset  . Arthritis    . Diabetes    . Hypertension    . Lung cancer    . Colon cancer    . Breast cancer    . Diabetes Mother   . Mental illness Mother   . Asthma Mother     Social History   Social History  . Marital status: Married    Spouse name: N/A  . Number of children: N/A  . Years of education: N/A   Social History Main Topics  . Smoking status: Never Smoker  . Smokeless tobacco: Never Used  . Alcohol use No  . Drug use: No  . Sexual activity: Yes    Birth control/ protection: Condom   Other Topics Concern  . None   Social History Narrative  . None    Vitals:   06/09/16 1555  BP: 110/80  Pulse: 84  Resp: 12  Temp: 98.1 F (36.7 C)   O2 sat 99% at RA.   Body mass index is 28.01 kg/m.    Physical Exam  Constitutional: She is oriented to person, place, and time. She appears well-developed. She does not appear ill. No distress.  HENT:  Head: Atraumatic.  Mouth/Throat: Oropharynx is clear and moist and mucous membranes are normal.  Eyes: Conjunctivae are normal.  Neck: Normal range of motion. No JVD present. No tracheal deviation present. Thyromegaly (mild, no tenderness) present. No thyroid mass present.  Cardiovascular: Normal rate and regular rhythm.   No murmur heard. Pulses:      Dorsalis pedis pulses are 2+ on the right side, and 2+ on the left side.  Respiratory: Effort normal and breath sounds normal. No stridor. No respiratory distress.  GI: Soft. She exhibits no mass. There is no hepatomegaly. There is no tenderness.  Musculoskeletal: She exhibits no edema or tenderness.       Cervical back: She exhibits normal range of motion, no tenderness and no bony tenderness.  No trigger points. No major deformities or signs of synovitis. Normal ROM.   Lymphadenopathy:    She has no cervical  adenopathy.  Neurological: She is alert and oriented to person, place, and time. She has normal strength. No cranial nerve deficit or sensory deficit. Coordination and gait normal.  Skin: Skin is warm. No rash noted. No erythema.  Psychiatric: Her mood appears anxious. Her affect is labile. Cognition and memory are normal.  Well groomed, good eye contact.      ASSESSMENT AND PLAN:     Suzi was seen today for establish care.  Diagnoses and all orders for this visit:    Lab Results  Component Value Date   HGBA1C 5.3 06/09/2016   Lab Results  Component Value Date   VITAMINB12 263 06/09/2016   Lab Results  Component Value Date   CREATININE 0.82 06/09/2016   BUN 16 06/09/2016   NA 141 06/09/2016   K 4.1 06/09/2016   CL 104  06/09/2016   CO2 27 06/09/2016   Lab Results  Component Value Date   CKTOTAL 54 06/09/2016     Chronic fatigue  We discussed possible causes including systemic illness, vit deficiencies, and psychiatric conditions. She has had some work-up done in the past, today more labs done and further recommendations will be given accordingly.   -     Basic metabolic panel -     VITAMIN D 25 Hydroxy (Vit-D Deficiency, Fractures) -     CBC -     Vitamin B12  Myalgia  ? Fibromyalgia. ? Side effects from vaccination. Not clear if this is a new problems because a few times during interview she seems to imply symptoms are chronic. If related to vaccination recovery expected in a few weeks. Further recommendations will be given after lab results.  -     Basic metabolic panel -     Vitamin B12 -     C-reactive protein  Generalized anxiety disorder  This could be aggravating/causing some of her symptoms. She seems to be concerned about radiation exposure, we discussed some of guidelines (she is aware of most), may need to consider to another field, many available, that do not "expose" her to radiation.  I recommended to re-consider pregnancy plans for  now, she may need resuming treatment. F/U in1- 2 months.  Hypercalcemia  Ca++ has been in normal range, 9.6. Today labs repeated , further recommendations will be given according to lab results.   -     Parathyroid hormone, intact (no Ca) -     CK (Creatine Kinase); Future -     VITAMIN D 25 Hydroxy (Vit-D Deficiency, Fractures) -     Calcium, ionized -     CBC -     HgB A1c -     CK (Creatine Kinase)  Hyperglycemia  Mild. Healthy diet and regular exercise for primary prevention. Mother Hx of DM II.  -     Basic metabolic panel -     HgB 123456          Jenny Lai G. Martinique, MD  [facility]. [place] office.

## 2016-06-09 NOTE — Progress Notes (Signed)
Pre visit review using our clinic review tool, if applicable. No additional management support is needed unless otherwise documented below in the visit note. 

## 2016-06-09 NOTE — Patient Instructions (Addendum)
A few things to remember from today's visit:   Chronic fatigue - Plan: Basic metabolic panel, VITAMIN D 25 Hydroxy (Vit-D Deficiency, Fractures), CBC  Myalgia - Plan: Basic metabolic panel  Generalized anxiety disorder  Hypercalcemia - Plan: Parathyroid hormone, intact (no Ca), CK (Creatine Kinase), VITAMIN D 25 Hydroxy (Vit-D Deficiency, Fractures), Calcium, ionized, CBC, HgB A1c  Hyperglycemia - Plan: Basic metabolic panel, HgB 123456  Today I do not find any significant abnormality on exam. Question of viral illness is a possible explanation.  If symptoms continue we may need a rheumatologist evaluation.  Please consult with her gynecologist about your plans in regard to pregnancy and anxiety.  Please be sure medication list is accurate. If a new problem present, please set up appointment sooner than planned today.

## 2016-06-10 ENCOUNTER — Encounter: Payer: Self-pay | Admitting: Family Medicine

## 2016-06-10 LAB — BASIC METABOLIC PANEL
BUN: 16 mg/dL (ref 6–23)
CALCIUM: 9.6 mg/dL (ref 8.4–10.5)
CHLORIDE: 104 meq/L (ref 96–112)
CO2: 27 meq/L (ref 19–32)
CREATININE: 0.82 mg/dL (ref 0.40–1.20)
GFR: 87.43 mL/min (ref >60.00)
Glucose, Bld: 95 mg/dL (ref 70–99)
Potassium: 4.1 mEq/L (ref 3.5–5.1)
SODIUM: 141 meq/L (ref 135–145)

## 2016-06-10 LAB — HEMOGLOBIN A1C: HEMOGLOBIN A1C: 5.3 % (ref 4.6–6.5)

## 2016-06-10 LAB — CBC
HCT: 39.6 % (ref 36.0–46.0)
HEMOGLOBIN: 13.4 g/dL (ref 12.0–15.0)
MCHC: 33.9 g/dL (ref 30.0–36.0)
MCV: 90.7 fl (ref 78.0–100.0)
Platelets: 323 10*3/uL (ref 150.0–400.0)
RBC: 4.37 Mil/uL (ref 3.87–5.11)
RDW: 12.9 % (ref 11.5–15.5)
WBC: 7.4 10*3/uL (ref 4.0–10.5)

## 2016-06-10 LAB — CALCIUM, IONIZED: CALCIUM ION: 5.1 mg/dL (ref 4.8–5.6)

## 2016-06-10 LAB — VITAMIN B12: Vitamin B-12: 263 pg/mL (ref 211–911)

## 2016-06-10 LAB — CK: CK TOTAL: 54 U/L (ref 7–177)

## 2016-06-10 LAB — PARATHYROID HORMONE, INTACT (NO CA): PTH: 19 pg/mL (ref 14–64)

## 2016-06-10 LAB — VITAMIN D 25 HYDROXY (VIT D DEFICIENCY, FRACTURES): VITD: 14.73 ng/mL — AB (ref 30.00–100.00)

## 2016-06-10 LAB — C-REACTIVE PROTEIN: CRP: 0.2 mg/dL — AB (ref 0.5–20.0)

## 2016-06-12 ENCOUNTER — Other Ambulatory Visit: Payer: Self-pay

## 2016-06-12 MED ORDER — VITAMIN D (ERGOCALCIFEROL) 1.25 MG (50000 UNIT) PO CAPS: ORAL_CAPSULE | ORAL | 1 refills | Status: DC

## 2016-06-12 MED ORDER — VITAMIN B-12 1000 MCG PO TABS: 1000.0000 ug | ORAL_TABLET | Freq: Every day | ORAL | 2 refills | Status: DC

## 2016-06-13 ENCOUNTER — Encounter: Payer: Self-pay | Admitting: Family Medicine

## 2016-07-20 ENCOUNTER — Encounter (HOSPITAL_BASED_OUTPATIENT_CLINIC_OR_DEPARTMENT_OTHER): Payer: Self-pay | Admitting: *Deleted

## 2016-07-20 ENCOUNTER — Emergency Department (HOSPITAL_BASED_OUTPATIENT_CLINIC_OR_DEPARTMENT_OTHER)
Admission: EM | Admit: 2016-07-20 | Discharge: 2016-07-20 | Disposition: A | Discharge status: Home / Self Care | Payer: Managed Care, Other (non HMO) | Attending: Emergency Medicine | Admitting: Emergency Medicine

## 2016-07-20 ENCOUNTER — Emergency Department (HOSPITAL_BASED_OUTPATIENT_CLINIC_OR_DEPARTMENT_OTHER): Payer: Managed Care, Other (non HMO)

## 2016-07-20 DIAGNOSIS — R102 Pelvic and perineal pain: Secondary | ICD-10-CM | POA: Diagnosis not present

## 2016-07-20 LAB — URINE MICROSCOPIC-ADD ON

## 2016-07-20 LAB — COMPREHENSIVE METABOLIC PANEL
ALBUMIN: 4.3 g/dL (ref 3.5–5.0)
ALT: 21 U/L (ref 14–54)
ANION GAP: 7 (ref 5–15)
AST: 18 U/L (ref 15–41)
Alkaline Phosphatase: 37 U/L — ABNORMAL LOW (ref 38–126)
BUN: 12 mg/dL (ref 6–20)
CO2: 23 mmol/L (ref 22–32)
Calcium: 9.2 mg/dL (ref 8.9–10.3)
Chloride: 108 mmol/L (ref 101–111)
Creatinine, Ser: 0.69 mg/dL (ref 0.44–1.00)
GFR calc Af Amer: >60 mL/min (ref >60)
GFR calc non Af Amer: >60 mL/min (ref >60)
GLUCOSE: 92 mg/dL (ref 65–99)
POTASSIUM: 3.7 mmol/L (ref 3.5–5.1)
SODIUM: 138 mmol/L (ref 135–145)
TOTAL PROTEIN: 7.2 g/dL (ref 6.5–8.1)
Total Bilirubin: 0.4 mg/dL (ref 0.3–1.2)

## 2016-07-20 LAB — URINALYSIS, ROUTINE W REFLEX MICROSCOPIC
Bilirubin Urine: NEGATIVE
GLUCOSE, UA: NEGATIVE mg/dL
Ketones, ur: NEGATIVE mg/dL
LEUKOCYTES UA: NEGATIVE
Nitrite: NEGATIVE
Protein, ur: NEGATIVE mg/dL
Specific Gravity, Urine: 1.024 (ref 1.005–1.030)
pH: 5.5 (ref 5.0–8.0)

## 2016-07-20 LAB — CBC WITH DIFFERENTIAL/PLATELET
BASOS PCT: 0 %
Basophils Absolute: 0 10*3/uL (ref 0.0–0.1)
Eosinophils Absolute: 0.1 10*3/uL (ref 0.0–0.7)
Eosinophils Relative: 1 %
HEMATOCRIT: 37.8 % (ref 36.0–46.0)
HEMOGLOBIN: 12.6 g/dL (ref 12.0–15.0)
LYMPHS ABS: 1.7 10*3/uL (ref 0.7–4.0)
Lymphocytes Relative: 18 %
MCH: 30.6 pg (ref 26.0–34.0)
MCHC: 33.3 g/dL (ref 30.0–36.0)
MCV: 91.7 fL (ref 78.0–100.0)
MONOS PCT: 8 %
Monocytes Absolute: 0.8 10*3/uL (ref 0.1–1.0)
NEUTROS ABS: 6.9 10*3/uL (ref 1.7–7.7)
NEUTROS PCT: 73 %
Platelets: 290 10*3/uL (ref 150–400)
RBC: 4.12 MIL/uL (ref 3.87–5.11)
RDW: 12.3 % (ref 11.5–15.5)
WBC: 9.4 10*3/uL (ref 4.0–10.5)

## 2016-07-20 LAB — WET PREP, GENITAL
Clue Cells Wet Prep HPF POC: NONE SEEN
Sperm: NONE SEEN
Trich, Wet Prep: NONE SEEN
Yeast Wet Prep HPF POC: NONE SEEN

## 2016-07-20 LAB — LIPASE, BLOOD: Lipase: 27 U/L (ref 11–51)

## 2016-07-20 LAB — PREGNANCY, URINE: Preg Test, Ur: NEGATIVE

## 2016-07-20 MED ORDER — ONDANSETRON HCL 4 MG/2ML IJ SOLN: 4.0000 mg | Freq: Once | INTRAMUSCULAR | Status: DC

## 2016-07-20 MED ORDER — ONDANSETRON 4 MG PO TBDP
4.0000 mg | ORAL_TABLET | Freq: Once | ORAL | Status: AC | PRN
  Administered 2016-07-20: 4 mg via ORAL
  Filled 2016-07-20: qty 1

## 2016-07-20 MED ORDER — PROMETHAZINE HCL 25 MG PO TABS
25.0000 mg | ORAL_TABLET | Freq: Once | ORAL | Status: AC
  Administered 2016-07-20: 25 mg via ORAL
  Filled 2016-07-20: qty 1

## 2016-07-20 MED ORDER — PROMETHAZINE HCL 25 MG PO TABS: 25.0000 mg | ORAL_TABLET | Freq: Four times a day (QID) | ORAL | 0 refills | Status: DC | PRN

## 2016-07-20 NOTE — ED Provider Notes (Signed)
[facility] DEPT [facility] Provider Note   CSN: [id number] Arrival date & time: 07/20/16  1907  By signing my name below, I, Brandy [name], attest that this documentation has been prepared under the direction and in the presence of [facility].  Electronically Signed: Ephriam [name], ED Scribe. 07/20/16. 8:04 PM.   History   Chief Complaint Chief Complaint  Patient presents with  . Pelvic Pain    HPI HPI Comments: Brandy [name] is a 30 y.o. female with Hx of nephrolithiasis, who presents to the Emergency Department complaining of intermittent, right pelvic pain with associated nausea that started yesterday. The pain is intermittent and sharp but has become more constant throughout the day today. The pain is not triggered by any certain movements and is not relived by anything. Pt has had a normal appetite. Pt was given Zofran on arrival to the ED and reports significant relief of nausea currently. She states that she is 3 days late for her period and states that she believes something is wrong with her ovaries. Pt reports that she had a miscarriage last year and has had normal menstrual cycles but "irregular ovulation". No hematuria, vaginal bleeding or discharge.   Dr. Ulanda Edison [facility] in [place]   The history is provided by the patient. No language interpreter was used.    Past Medical History:  Diagnosis Date  . Anxiety   . Depression   . History of nephrolithiasis   . Kidney stone   . Migraine     Patient Active Problem List   Diagnosis Date Noted  . Generalized anxiety disorder 06/09/2016  . Anterior neck pain 12/30/2015  . Enlarged thyroid gland 12/30/2015  . Fatigue 12/30/2015  . Routine general medical examination at a health care facility 09/27/2014  . Tendinitis of right ankle 08/14/2014  . Nausea vomiting and diarrhea 12/28/2013  . Serum calcium elevated 12/28/2013  . 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 DUB (dysfunctional uterine bleeding) 06/04/2011  . GERD  (gastroesophageal reflux disease) 02/19/2011  . Depression 09/06/2008  . NEPHROLITHIASIS, HX OF 12/16/2007    Past Surgical History:  Procedure Laterality Date  . CHOLECYSTECTOMY  2014  . [place], 2004    OB History    Gravida Para Term Preterm AB Living   1 0 0 0 1 0   SAB TAB Ectopic Multiple Live Births   1 0 0 0        Obstetric Comments   Age first period 17       Home Medications    Prior to Admission medications   Medication Sig Start Date End Date Taking? Authorizing Provider  acetaminophen (TYLENOL) 500 MG tablet Take 500 mg by mouth every 6 (six) hours as needed for mild pain. Reported on 12/30/2015    Historical Provider, MD  vitamin B-12 (CYANOCOBALAMIN) 1000 MCG tablet Take 1 tablet (1,000 mcg total) by mouth daily. 06/12/16   Betty G Martinique, MD  Vitamin D, Ergocalciferol, (DRISDOL) 50000 units CAPS capsule Take 1 capsule by mouth weekly for 8 weeks. Then OTC Vit D 2000U daily. 06/12/16   Betty G Martinique, MD    Family History Family History  Problem Relation Age of Onset  . Arthritis    . Diabetes    . Hypertension    . Lung cancer    . Colon cancer    . Breast cancer    . Diabetes Mother   . Mental illness Mother   . Asthma Mother  Social History Social History  Substance Use Topics  . Smoking status: Never Smoker  . Smokeless tobacco: Never Used  . Alcohol use No     Allergies   Penicillins   Review of Systems Review of Systems 10 Systems reviewed and all are negative for acute change except as noted in the HPI.    Physical Exam Updated Vital Signs BP 129/87   Pulse 108   Temp 98 F (36.7 C)   Resp 18   Ht 5\' 9"  (1.753 m)   Wt 183 lb (83 kg)   LMP 06/19/2016   SpO2 97%   BMI 27.02 kg/m   Physical Exam  Constitutional: She is oriented to person, place, and time.  HENT:  Right Ear: External ear normal.  Left Ear: External ear normal.  Nose: Nose normal.  Mouth/Throat: Oropharynx is clear and moist. No  oropharyngeal exudate.  Eyes: Conjunctivae are normal.  Neck: Neck supple.  Cardiovascular: Normal rate, regular rhythm, normal heart sounds and intact distal pulses.   Pulmonary/Chest: Effort normal and breath sounds normal. No respiratory distress. She has no wheezes.  Abdominal: Soft. Bowel sounds are normal. She exhibits no distension. There is no tenderness. There is no rebound and no guarding.  Genitourinary: Pelvic exam was performed with patient supine. There is no lesion on the right labia. There is no lesion on the left labia. Cervix exhibits no motion tenderness, no discharge and no friability. Right adnexum displays tenderness. Left adnexum displays no tenderness. No tenderness or bleeding in the vagina. No vaginal discharge found.  Musculoskeletal: She exhibits no edema.  Lymphadenopathy:    She has no cervical adenopathy.  Neurological: She is alert and oriented to person, place, and time. No cranial nerve deficit.  Skin: Skin is warm and dry.  Psychiatric: She has a normal mood and affect.  Nursing note and vitals reviewed.    ED Treatments / Results  DIAGNOSTIC STUDIES: Oxygen Saturation is 97% on RA, normal by my interpretation.  COORDINATION OF CARE: 8:03 PM-Will do pelvic exam. Discussed treatment plan with pt at bedside and pt agreed to plan.   Labs (all labs ordered are listed, but only abnormal results are displayed) Labs Reviewed  WET PREP, GENITAL - Abnormal; Notable for the following:       Result Value   WBC, Wet Prep HPF POC MODERATE (*)    All other components within normal limits  URINALYSIS, ROUTINE W REFLEX MICROSCOPIC (NOT AT [facility]) - Abnormal; Notable for the following:    Hgb urine dipstick TRACE (*)    All other components within normal limits  URINE MICROSCOPIC-ADD ON - Abnormal; Notable for the following:    Squamous Epithelial / LPF 0-5 (*)    Bacteria, UA MANY (*)    All other components within normal limits  COMPREHENSIVE METABOLIC PANEL -  Abnormal; Notable for the following:    Alkaline Phosphatase 37 (*)    All other components within normal limits  URINE CULTURE  PREGNANCY, URINE  LIPASE, BLOOD  CBC WITH DIFFERENTIAL/PLATELET  HIV ANTIBODY (ROUTINE TESTING)  RPR  GC/CHLAMYDIA PROBE AMP ([place]) NOT AT [facility]    EKG  EKG Interpretation None       Radiology No results found.  Procedures Procedures (including critical care time)  Medications Ordered in ED Medications  ondansetron (ZOFRAN-ODT) disintegrating tablet 4 mg (4 mg Oral Given 07/20/16 1928)     Initial Impression / Assessment and Plan / ED Course  I have reviewed the triage  vital signs and the nursing notes.  Pertinent labs & imaging results that were available during my care of the patient were reviewed by me and considered in my medical decision making (see chart for details).  Clinical Course    Urine with many bacteria but pt is asymtpomatic so will send for culture and hold off on abx therapy. Pt continues to decline pain medicine. Her tenderness is adnexal more than RLQ and nontender at mcburney's point. We obtained pelvic US which reveals left cyst but no right adnexal abnormalities. I discussed option of CT to r/o appy with pt despite her normal labs and exam/history not consistent with classic appendicitis. Pt would like to hold off at this time as she recently had another CT earlier this year. I gave pt rx for phenergan and instructed close f/u with PCP and her OB GYN. Strict return precautions given.  Final Clinical Impressions(s) / ED Diagnoses   Final diagnoses:  Right adnexal tenderness    New Prescriptions Discharge Medication List as of 07/20/2016 10:24 PM    START taking these medications   Details  promethazine (PHENERGAN) 25 MG tablet Take 1 tablet (25 mg total) by mouth every 6 (six) hours as needed for nausea or vomiting., Starting Mon 07/20/2016, Print      I personally performed the services described in this  documentation, which was scribed in my presence. The recorded information has been reviewed and is accurate.    Anne Ng, PA-C 07/21/16 0104    Charlesetta Shanks, MD 07/23/16 [phone]

## 2016-07-20 NOTE — ED Triage Notes (Signed)
Pt c/o right  Lower abd pain x 1 day

## 2016-07-20 NOTE — ED Notes (Signed)
Patient transported to Ultrasound 

## 2016-07-20 NOTE — Discharge Instructions (Signed)
Your bloodwork was normal. Your urine had some bacteria in it. As we discussed I will send your urine for culture but we will hold off on antibiotics for now. Your ultrasound showed a cyst on your left ovary. Your right ovary is normal. We decided to hold off on a CT scan today. Follow up with your primary care provider. Return to the ER for new or worsening symptoms.

## 2016-07-21 ENCOUNTER — Ambulatory Visit (INDEPENDENT_AMBULATORY_CARE_PROVIDER_SITE_OTHER): Payer: Managed Care, Other (non HMO) | Admitting: Family Medicine

## 2016-07-21 ENCOUNTER — Encounter: Payer: Self-pay | Admitting: Family Medicine

## 2016-07-21 VITALS — BP 110/62 | HR 82 | Resp 12 | Ht 69.0 in | Wt 186.4 lb

## 2016-07-21 DIAGNOSIS — R1031 Right lower quadrant pain: Secondary | ICD-10-CM

## 2016-07-21 DIAGNOSIS — F411 Generalized anxiety disorder: Secondary | ICD-10-CM

## 2016-07-21 DIAGNOSIS — E538 Deficiency of other specified B group vitamins: Secondary | ICD-10-CM

## 2016-07-21 DIAGNOSIS — E559 Vitamin D deficiency, unspecified: Secondary | ICD-10-CM | POA: Diagnosis not present

## 2016-07-21 HISTORY — DX: Deficiency of other specified B group vitamins: E53.8

## 2016-07-21 LAB — GC/CHLAMYDIA PROBE AMP (~~LOC~~) NOT AT ARMC
CHLAMYDIA, DNA PROBE: NEGATIVE
Neisseria Gonorrhea: NEGATIVE

## 2016-07-21 MED ORDER — FLUOXETINE HCL 20 MG PO TABS: 20.0000 mg | ORAL_TABLET | Freq: Every day | ORAL | 3 refills | Status: DC

## 2016-07-21 NOTE — Progress Notes (Signed)
HPI:   [name] is a 30 y.o. female, who is here today to follow on last OV, 06/09/16, when she was c/o arthralgias,fatigue, and myalgias. She was also concerned about parathyroid disease because in the past her Ca++ had been in the normal range. PTH,iCa++,,CBC,HgA1C,BMP,CRP were done.  Lab work otherwise normal except for B12 and 25 OH vit D.   Lab Results  Component Value Date   CKTOTAL 54 06/09/2016    B12 on lower normal range.  Lab Results  Component Value Date   [id number] 06/09/2016    She started OTC B12 1000 mcg daily but discontinued because didn't tolerate well, it caused palpitations. B12 supplementation helped with fatigue.  25 OH vit D low at 14.73. She is currently on Ergocalciferol 50,000 U weekly.  She is reporting that arthralgias and myalgias have resolved since she started ergocalciferol. Neck pain has also resolved.  In general she feels better.  Concerns today: abdominal pain. A week ago she started with right lower quadrant pain, cramping similar to those she had with menses. She was in the ER yesterday, workup otherwise negative. RLQ pain is not bad, 2-3/10,intermittently.  Pain has improved some.  Denies associated nausea, vomiting, changes in bowel habits, blood in stool or urinary symptoms.  LMP 06/18/16, denies Hx of irregular menses, states that she "never" missed a menstrual period before. She is trying to get pregnant.   Lab Results  Component Value Date   WBC 9.4 07/20/2016   HGB 12.6 07/20/2016   HCT 37.8 07/20/2016   MCV 91.7 07/20/2016   PLT 290 07/20/2016    Lab Results  Component Value Date   CREATININE 0.69 07/20/2016   BUN 12 07/20/2016   NA 138 07/20/2016   K 3.7 07/20/2016   CL 108 07/20/2016   CO2 23 07/20/2016   Lab Results  Component Value Date   ALT 21 07/20/2016   AST 18 07/20/2016   ALKPHOS 37 (L) 07/20/2016   BILITOT 0.4 07/20/2016   Lab Results  Component Value Date     LIPASE 27 07/20/2016   Lab Results  Component Value Date   PREGTESTUR NEGATIVE 07/20/2016   Pending RPR, Ucx,, and HIV.  Transvaginal U/S: 15 mm LEFT ovarian hemorrhagic cyst, no indicated follow-up. 16 mm endometrium, consistent with late secretory phase.       Planning on gyn appt for her pap smear.  -Hx of anxiety, she discontinued Clonazepam and did not try SSRI because planning on getting pregnant. Anxiety seems to be getting worse. She is very frustrated by the fact she has not been able to get pregnant again, had a miscarriage about a year ago. "I really want a baby" "I do not want something happen to my tubes"  She denies suicidal thoughts. She wonders if she can try something like Fluoxetine and discontinued once she gets pregnant. Mother and sister with Hx of anxiety and depression.     Review of Systems  Constitutional: Positive for fatigue. Negative for activity change, appetite change, fever and unexpected weight change.  HENT: Negative for mouth sores, nosebleeds and trouble swallowing.   Eyes: Negative for redness and visual disturbance.  Respiratory: Negative for cough, shortness of breath and wheezing.   Cardiovascular: Negative for chest pain, palpitations and leg swelling.  Gastrointestinal: Positive for abdominal pain. Negative for nausea and vomiting.       Negative for changes in bowel habits.  Genitourinary: Negative for decreased urine volume, difficulty urinating,  dysuria, hematuria and vaginal bleeding.  Musculoskeletal: Negative for arthralgias, myalgias and neck pain.  Skin: Negative for rash.  Neurological: Negative for seizures, syncope, weakness, numbness and headaches.  Psychiatric/Behavioral: Negative for confusion. The patient is nervous/anxious.       Current Outpatient Prescriptions on File Prior to Visit  Medication Sig Dispense Refill  . acetaminophen (TYLENOL) 500 MG tablet Take 500 mg by mouth every 6 (six) hours as needed for  mild pain. Reported on 12/30/2015    . promethazine (PHENERGAN) 25 MG tablet Take 1 tablet (25 mg total) by mouth every 6 (six) hours as needed for nausea or vomiting. 30 tablet 0  . vitamin B-12 (CYANOCOBALAMIN) 1000 MCG tablet Take 1 tablet (1,000 mcg total) by mouth daily. 30 tablet 2  . Vitamin D, Ergocalciferol, (DRISDOL) 50000 units CAPS capsule Take 1 capsule by mouth weekly for 8 weeks. Then OTC Vit D 2000U daily. 4 capsule 1   No current facility-administered medications on file prior to visit.      Past Medical History:  Diagnosis Date  . Anxiety   . Depression   . History of nephrolithiasis   . Kidney stone   . Migraine    Allergies  Allergen Reactions  . Penicillins Hives    Childhood reaction    Social History   Social History  . Marital status: Married    Spouse name: N/A  . Number of children: N/A  . Years of education: N/A   Social History Main Topics  . Smoking status: Never Smoker  . Smokeless tobacco: Never Used  . Alcohol use No  . Drug use: No  . Sexual activity: Yes    Birth control/ protection: Condom   Other Topics Concern  . None   Social History Narrative  . None    Vitals:   07/21/16 1530  BP: 110/62  Pulse: 82  Resp: 12   Body mass index is 27.52 kg/m.    Physical Exam  Nursing note and vitals reviewed. Constitutional: She is oriented to person, place, and time. She appears well-developed and well-nourished. No distress.  HENT:  Head: Atraumatic.  Mouth/Throat: Oropharynx is clear and moist and mucous membranes are normal.  Eyes: Conjunctivae and EOM are normal.  Cardiovascular: Normal rate and regular rhythm.   No murmur heard. Pulses:      Dorsalis pedis pulses are 2+ on the right side, and 2+ on the left side.  Respiratory: Effort normal and breath sounds normal. No respiratory distress.  GI: Soft. She exhibits no mass. There is no hepatomegaly. There is no tenderness.  Musculoskeletal: She exhibits no edema.    Lymphadenopathy:    She has no cervical adenopathy.  Neurological: She is alert and oriented to person, place, and time. She has normal strength. Coordination normal.  Skin: Skin is warm. No erythema.  Psychiatric: Her mood appears anxious. Her affect is labile. Cognition and memory are normal.  Well groomed, good eye contact.      ASSESSMENT AND PLAN:     Kortney was seen today for follow-up.  Diagnoses and all orders for this visit:    Vitamin D deficiency  Continue Ergocalciferol 50,000 U weekly to complete 8 weeks then OTC 2000 U. Follow with lab in 3 months.   -     VITAMIN D 25 Hydroxy (Vit-D Deficiency, Fractures); Future  Abdominal pain, RLQ  Improved. Some causes discussed. Instructed about warning signs.  Generalized anxiety disorder  Strongly recommend establishing with psychotherapists. She would like  to try Fluoxetine, she can start with 1/2 tab for 3-5 days then increase to whole tab. We discussed some side effects and fetal harm risk, specially after 20 weeks. She is planning on seeing her gyn in the next few days, when she can discuss plan in regard to weaning medication off in case she gets pregnant, risk Vs benefits. Clearly instructed about warning signs. F/U in 4-6 weeks.  -     FLUoxetine (PROZAC) 20 MG tablet; Take 1 tablet (20 mg total) by mouth daily.  B12 deficiency  She did not tolerate 1000 mcg but will try lower dose, B12 500 mcg daily.      -Ms. Brandy [name] was advised to return sooner than planned today if new concerns arise.       Brandy [name] G. Martinique, MD  [facility]. [place] office.

## 2016-07-21 NOTE — Patient Instructions (Signed)
A few things to remember from today's visit:   Generalized anxiety disorder - Plan: FLUoxetine (PROZAC) 20 MG tablet  Vitamin D deficiency - Plan: VITAMIN D 25 Hydroxy (Vit-D Deficiency, Fractures)   Please be sure medication list is accurate. If a new problem present, please set up appointment sooner than planned today.

## 2016-07-21 NOTE — Progress Notes (Signed)
Pre visit review using our clinic review tool, if applicable. No additional management support is needed unless otherwise documented below in the visit note. 

## 2016-07-22 LAB — URINE CULTURE

## 2016-07-22 LAB — HIV ANTIBODY (ROUTINE TESTING W REFLEX): HIV SCREEN 4TH GENERATION: NONREACTIVE

## 2016-07-22 LAB — RPR: RPR: NONREACTIVE

## 2016-09-04 ENCOUNTER — Emergency Department (INDEPENDENT_AMBULATORY_CARE_PROVIDER_SITE_OTHER)
Admission: EM | Admit: 2016-09-04 | Discharge: 2016-09-04 | Disposition: A | Discharge status: Home / Self Care | Payer: Managed Care, Other (non HMO) | Source: Home / Self Care | Attending: Family Medicine | Admitting: Family Medicine

## 2016-09-04 ENCOUNTER — Encounter: Payer: Self-pay | Admitting: *Deleted

## 2016-09-04 DIAGNOSIS — J111 Influenza due to unidentified influenza virus with other respiratory manifestations: Secondary | ICD-10-CM

## 2016-09-04 DIAGNOSIS — R69 Illness, unspecified: Secondary | ICD-10-CM

## 2016-09-04 MED ORDER — OSELTAMIVIR PHOSPHATE 75 MG PO CAPS: 75.0000 mg | ORAL_CAPSULE | Freq: Two times a day (BID) | ORAL | 0 refills | Status: DC

## 2016-09-04 NOTE — ED Triage Notes (Signed)
Patient c/o HA, bodyaches, sneezing and low grade fever x 2 days. Family members +flu. Received flu vaccine this season.

## 2016-09-04 NOTE — ED Provider Notes (Signed)
CSN: [id number]     Arrival date & time 09/04/16  1317 History   None    Chief Complaint  Patient presents with  . Generalized Body Aches  . Headache   (Consider location/radiation/quality/duration/timing/severity/associated sxs/prior Treatment) The history is provided by the patient. No language interpreter was used.  Cough  Cough characteristics:  Productive Sputum characteristics:  Nondescript Severity:  Moderate Duration:  2 days Timing:  Constant Progression:  Worsening Chronicity:  New Context: not sick contacts   Relieved by:  Nothing Worsened by:  Smoking Ineffective treatments:  Rest Associated symptoms: no sinus congestion   Pt's Mother is in the hospital with the flu and pneumonia.  Pt and her Mother both had flu shots.  Pt complains of cough and fever.  Pt has bodyaches  Past Medical History:  Diagnosis Date  . Anxiety   . Depression   . History of nephrolithiasis   . Kidney stone   . Migraine    Past Surgical History:  Procedure Laterality Date  . CHOLECYSTECTOMY  2014  . [place], 2004   Family History  Problem Relation Age of Onset  . Arthritis    . Diabetes    . Hypertension    . Lung cancer    . Colon cancer    . Breast cancer    . Diabetes Mother   . Mental illness Mother   . Asthma Mother    Social History  Substance Use Topics  . Smoking status: Never Smoker  . Smokeless tobacco: Never Used  . Alcohol use No   OB History    Gravida Para Term Preterm AB Living   1 0 0 0 1 0   SAB TAB Ectopic Multiple Live Births   1 0 0 0        Obstetric Comments   Age first period 34     Review of Systems  Respiratory: Positive for cough.   All other systems reviewed and are negative.   Allergies  Penicillins  Home Medications   Prior to Admission medications   Medication Sig Start Date End Date Taking? Authorizing Provider  clonazePAM (KLONOPIN) 1 MG tablet Take 1 mg by mouth 2 (two) times daily.   Yes Historical  Provider, MD  FLUoxetine (PROZAC) 20 MG tablet Take 1 tablet (20 mg total) by mouth daily. 07/21/16  Yes Betty G Martinique, MD   Meds Ordered and Administered this Visit  Medications - No data to display  BP 115/78 (BP Location: Left Arm)   Pulse 110   Temp 98.6 F (37 C) (Oral)   Resp 16   Wt 182 lb (82.6 kg)   LMP 08/24/2016   SpO2 99%   BMI 26.88 kg/m  No data found.   Physical Exam  Constitutional: She appears well-developed and well-nourished. No distress.  HENT:  Head: Normocephalic and atraumatic.  Eyes: Conjunctivae are normal.  Neck: Neck supple.  Cardiovascular: Normal rate and regular rhythm.   No murmur heard. Pulmonary/Chest: Effort normal and breath sounds normal. No respiratory distress.  Abdominal: Soft. There is no tenderness.  Musculoskeletal: She exhibits no edema.  Neurological: She is alert.  Skin: Skin is warm and dry.  Psychiatric: She has a normal mood and affect.  Nursing note and vitals reviewed.   Urgent Care Course   Clinical Course     Procedures (including critical care time)  Labs Review Labs Reviewed - No data to display  Imaging Review No results found.  Visual Acuity Review  Right Eye Distance:   Left Eye Distance:   Bilateral Distance:    Right Eye Near:   Left Eye Near:    Bilateral Near:         MDM  Illness sounds like influenza.   I advised pt that she might benefit from tamiflu.  Pt will take.  Pt advised she is contagious.     1. Influenza-like illness    Meds ordered this encounter  Medications  . clonazePAM (KLONOPIN) 1 MG tablet    Sig: Take 1 mg by mouth 2 (two) times daily.  Marland Kitchen oseltamivir (TAMIFLU) 75 MG capsule    Sig: Take 1 capsule (75 mg total) by mouth every 12 (twelve) hours.    Dispense:  10 capsule    Refill:  0    Order Specific Question:   Supervising Provider    Answer:   Theone Murdoch A [[id number]]  An After Visit Summary was printed and given to the patient.    [place],  PA-C 09/04/16 1445

## 2016-09-08 ENCOUNTER — Ambulatory Visit (INDEPENDENT_AMBULATORY_CARE_PROVIDER_SITE_OTHER): Payer: Managed Care, Other (non HMO) | Admitting: Family Medicine

## 2016-09-08 DIAGNOSIS — Z0289 Encounter for other administrative examinations: Secondary | ICD-10-CM

## 2016-10-02 ENCOUNTER — Other Ambulatory Visit: Payer: Self-pay | Admitting: Family Medicine

## 2016-10-21 ENCOUNTER — Other Ambulatory Visit (INDEPENDENT_AMBULATORY_CARE_PROVIDER_SITE_OTHER): Payer: Managed Care, Other (non HMO)

## 2016-10-21 DIAGNOSIS — E559 Vitamin D deficiency, unspecified: Secondary | ICD-10-CM

## 2016-10-22 LAB — VITAMIN D 25 HYDROXY (VIT D DEFICIENCY, FRACTURES): VITD: 14.56 ng/mL — ABNORMAL LOW (ref 30.00–100.00)

## 2016-10-25 ENCOUNTER — Encounter (HOSPITAL_COMMUNITY): Payer: Self-pay | Admitting: Emergency Medicine

## 2016-10-25 ENCOUNTER — Emergency Department (HOSPITAL_COMMUNITY)
Admission: EM | Admit: 2016-10-25 | Discharge: 2016-10-25 | Disposition: A | Discharge status: Home / Self Care | Payer: Managed Care, Other (non HMO) | Attending: Emergency Medicine | Admitting: Emergency Medicine

## 2016-10-25 ENCOUNTER — Emergency Department (HOSPITAL_COMMUNITY): Payer: Managed Care, Other (non HMO)

## 2016-10-25 DIAGNOSIS — R51 Headache: Secondary | ICD-10-CM | POA: Insufficient documentation

## 2016-10-25 DIAGNOSIS — R519 Headache, unspecified: Secondary | ICD-10-CM

## 2016-10-25 LAB — POC URINE PREG, ED: Preg Test, Ur: NEGATIVE

## 2016-10-25 MED ORDER — METOCLOPRAMIDE HCL 5 MG/ML IJ SOLN
10.0000 mg | Freq: Once | INTRAMUSCULAR | Status: AC
Start: 2016-10-25 — End: 2016-10-25
  Administered 2016-10-25: 10 mg via INTRAVENOUS
  Filled 2016-10-25: qty 2

## 2016-10-25 MED ORDER — KETOROLAC TROMETHAMINE 15 MG/ML IJ SOLN
15.0000 mg | Freq: Once | INTRAMUSCULAR | Status: AC
Start: 2016-10-25 — End: 2016-10-25
  Administered 2016-10-25: 15 mg via INTRAVENOUS
  Filled 2016-10-25: qty 1

## 2016-10-25 MED ORDER — DIPHENHYDRAMINE HCL 50 MG/ML IJ SOLN
25.0000 mg | Freq: Once | INTRAMUSCULAR | Status: AC
  Administered 2016-10-25: 25 mg via INTRAVENOUS
  Filled 2016-10-25: qty 1

## 2016-10-25 MED ORDER — SODIUM CHLORIDE 0.9 % IV BOLUS (SEPSIS)
1000.0000 mL | Freq: Once | INTRAVENOUS | Status: AC
  Administered 2016-10-25: 1000 mL via INTRAVENOUS

## 2016-10-25 NOTE — Discharge Instructions (Signed)
Please read attached information. If you experience any new or worsening signs or symptoms please return to the emergency room for evaluation. Please follow-up with your primary care provider or specialist as discussed.  °

## 2016-10-25 NOTE — ED Notes (Signed)
Pt c/o HA x 1 week.  Pt has noticed changes in her speech, dropping items she is holding onto, blurred vision, balance being off, forgetfulness.

## 2016-10-25 NOTE — ED Triage Notes (Signed)
Pt c/o HA in back of head x 1 week with blurred vision and nausea and some problems with remembering

## 2016-10-25 NOTE — ED Notes (Signed)
Patient transported to CT 

## 2016-10-25 NOTE — ED Provider Notes (Signed)
[place] DEPT Provider Note   CSN: [id number] Arrival date & time: 10/25/16  1606     History   Chief Complaint Chief Complaint  Patient presents with  . Headache    HPI Brandy [name] is a 31 y.o. female.  HPI   31 year old female presents today with headache.  Patient notes this is been on and off over the last month.  She feels tension in the front of her head that wraps around the posterior aspect.  She notes she wears glasses, but notes her vision is worsening.  She notes sometimes she has nausea associated with headache, no vomiting.  She denies any neurological deficits with this.  Denies any fever, or any other red flags.  Patient notes taking Tylenol at home which did not significantly improve her symptoms.  She also notes intermittent odd sensations throughout different parts of her body.      Past Medical History:  Diagnosis Date  . Anxiety   . Depression   . History of nephrolithiasis   . Kidney stone   . Migraine     Patient Active Problem List   Diagnosis Date Noted  . Vitamin D deficiency 07/21/2016  . B12 deficiency 07/21/2016  . Generalized anxiety disorder 06/09/2016  . Anterior neck pain 12/30/2015  . Enlarged thyroid gland 12/30/2015  . Fatigue 12/30/2015  . Routine general medical examination at a health care facility 09/27/2014  . Tendinitis of right ankle 08/14/2014  . Nausea vomiting and diarrhea 12/28/2013  . Serum calcium elevated 12/28/2013  . 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 DUB (dysfunctional uterine bleeding) 06/04/2011  . GERD (gastroesophageal reflux disease) 02/19/2011  . Depression 09/06/2008  . NEPHROLITHIASIS, HX OF 12/16/2007    Past Surgical History:  Procedure Laterality Date  . CHOLECYSTECTOMY  2014  . [place], 2004    OB History    Gravida Para Term Preterm AB Living   1 0 0 0 1 0   SAB TAB Ectopic Multiple Live Births   1 0 0 0        Obstetric Comments   Age first period 44        Home Medications    Prior to Admission medications   Medication Sig Start Date End Date Taking? Authorizing Provider  clonazePAM (KLONOPIN) 1 MG tablet Take 1 mg by mouth 2 (two) times daily.    Historical Provider, MD  FLUoxetine (PROZAC) 20 MG tablet Take 1 tablet (20 mg total) by mouth daily. 07/21/16   Betty G Martinique, MD  oseltamivir (TAMIFLU) 75 MG capsule Take 1 capsule (75 mg total) by mouth every 12 (twelve) hours. 09/04/16   Fransico Meadow, PA-C    Family History Family History  Problem Relation Age of Onset  . Arthritis    . Diabetes    . Hypertension    . Lung cancer    . Colon cancer    . Breast cancer    . Diabetes Mother   . Mental illness Mother   . Asthma Mother     Social History Social History  Substance Use Topics  . Smoking status: Never Smoker  . Smokeless tobacco: Never Used  . Alcohol use No     Allergies   Penicillins   Review of Systems Review of Systems  All other systems reviewed and are negative.    Physical Exam Updated Vital Signs BP 107/75   Pulse 77   Temp 98.4 F (36.9 C) (Oral)   Resp  16   Ht 5\' 9"  (1.753 m)   Wt 83.9 kg   SpO2 100%   BMI 27.32 kg/m   Physical Exam  Constitutional: She is oriented to person, place, and time. She appears well-developed and well-nourished. No distress.  HENT:  Head: Normocephalic and atraumatic.  Eyes: Conjunctivae and EOM are normal. Pupils are equal, round, and reactive to light. Right eye exhibits no discharge. Left eye exhibits no discharge. No scleral icterus.  Neck: Normal range of motion. Neck supple. No JVD present. No tracheal deviation present.  Pulmonary/Chest: Effort normal. No stridor.  Musculoskeletal: Normal range of motion. She exhibits no edema or tenderness.  Lymphadenopathy:    She has no cervical adenopathy.  Neurological: She is alert and oriented to person, place, and time. She has normal strength. She displays no atrophy and no tremor. No cranial nerve  deficit or sensory deficit. She exhibits normal muscle tone. She displays a negative Romberg sign. She displays no seizure activity. Coordination and gait normal. GCS eye subscore is 4. GCS verbal subscore is 5. GCS motor subscore is 6.  Reflex Scores:      Patellar reflexes are 2+ on the right side and 2+ on the left side. Skin: She is not diaphoretic.  Psychiatric: She has a normal mood and affect. Her behavior is normal. Judgment and thought content normal.  Nursing note and vitals reviewed.   ED Treatments / Results  Labs (all labs ordered are listed, but only abnormal results are displayed) Labs Reviewed  POC URINE PREG, ED    EKG  EKG Interpretation None       Radiology Ct Head Wo Contrast  Result Date: 10/25/2016 CLINICAL DATA:  Headache EXAM: CT HEAD WITHOUT CONTRAST TECHNIQUE: Contiguous axial images were obtained from the base of the skull through the vertex without intravenous contrast. COMPARISON:  None. FINDINGS: Brain: No evidence of acute infarction, hemorrhage, hydrocephalus, extra-axial collection or mass lesion/mass effect. Vascular: No hyperdense vessel or unexpected calcification. Skull: Negative Sinuses/Orbits: Negative Other: None IMPRESSION: Negative CT head Electronically Signed   By: Franchot Gallo M.D.   On: 10/25/2016 18:58    Procedures Procedures (including critical care time)  Medications Ordered in ED Medications  sodium chloride 0.9 % bolus 1,000 mL (0 mLs Intravenous Stopped 10/25/16 1905)  ketorolac (TORADOL) 15 MG/ML injection 15 mg (15 mg Intravenous Given 10/25/16 1802)  metoCLOPramide (REGLAN) injection 10 mg (10 mg Intravenous Given 10/25/16 1802)  diphenhydrAMINE (BENADRYL) injection 25 mg (25 mg Intravenous Given 10/25/16 1802)     Initial Impression / Assessment and Plan / ED Course  I have reviewed the triage vital signs and the nursing notes.  Pertinent labs & imaging results that were available during my care of the patient were  reviewed by me and considered in my medical decision making (see chart for details).     Final Clinical Impressions(s) / ED Diagnoses   Final diagnoses:  Nonintractable headache, unspecified chronicity pattern, unspecified headache type    Labs: Point-of-care urine pregnancy  Imaging: CT head without  Consults:  Therapeutics:  Discharge Meds:   Assessment/Plan: Patient presents with headache, improved with above medications.  She has no red flags, negative head CT.  She will be referred to neurology, strict return precautions given.  She verbalized understanding and agreement to today's plan and had no further questions or concerns at time discharge      New Prescriptions Discharge Medication List as of 10/25/2016  8:02 PM  Okey Regal, PA-C 10/25/16 2051    Daleen Bo, MD 10/25/16 [phone]

## 2016-10-26 ENCOUNTER — Encounter: Payer: Self-pay | Admitting: Family Medicine

## 2016-10-27 ENCOUNTER — Telehealth: Payer: Self-pay | Admitting: Family Medicine

## 2016-10-27 MED ORDER — VITAMIN D (ERGOCALCIFEROL) 1.25 MG (50000 UNIT) PO CAPS: 50000.0000 [IU] | ORAL_CAPSULE | ORAL | 0 refills | Status: DC

## 2016-10-27 NOTE — Telephone Encounter (Signed)
Please advise.   Called & spoke with patient, she has been taking the vitamin D. She is now on the OTC 2000 U daily.

## 2016-10-27 NOTE — Telephone Encounter (Signed)
So we need to resume Rx Ergocalciferol 50,000 U weekly and f/u appt in 3 months. Thanks, BJ

## 2016-10-27 NOTE — Telephone Encounter (Signed)
Pt has gotten her Vit-D results and wanted to know what she should do next she is feeling very nauseated and pain in her body and motor skills are off as well.  Pt would like to have a call back today if possible.

## 2016-10-27 NOTE — Telephone Encounter (Signed)
Rx sent in & patient is aware.

## 2016-10-29 ENCOUNTER — Encounter (HOSPITAL_COMMUNITY): Payer: Self-pay | Admitting: Emergency Medicine

## 2016-10-29 ENCOUNTER — Emergency Department (HOSPITAL_COMMUNITY)
Admission: EM | Admit: 2016-10-29 | Discharge: 2016-10-29 | Disposition: A | Discharge status: Home / Self Care | Payer: Managed Care, Other (non HMO) | Attending: Emergency Medicine | Admitting: Emergency Medicine

## 2016-10-29 ENCOUNTER — Ambulatory Visit (INDEPENDENT_AMBULATORY_CARE_PROVIDER_SITE_OTHER): Payer: Managed Care, Other (non HMO) | Admitting: Family Medicine

## 2016-10-29 ENCOUNTER — Encounter: Payer: Self-pay | Admitting: Family Medicine

## 2016-10-29 VITALS — BP 100/58 | HR 73 | Temp 98.1°F | Ht 69.0 in | Wt 190.7 lb

## 2016-10-29 DIAGNOSIS — M2550 Pain in unspecified joint: Secondary | ICD-10-CM | POA: Insufficient documentation

## 2016-10-29 DIAGNOSIS — G4489 Other headache syndrome: Secondary | ICD-10-CM | POA: Diagnosis not present

## 2016-10-29 DIAGNOSIS — Z79899 Other long term (current) drug therapy: Secondary | ICD-10-CM | POA: Diagnosis not present

## 2016-10-29 DIAGNOSIS — J029 Acute pharyngitis, unspecified: Secondary | ICD-10-CM

## 2016-10-29 DIAGNOSIS — R51 Headache: Secondary | ICD-10-CM | POA: Diagnosis present

## 2016-10-29 DIAGNOSIS — H6983 Other specified disorders of Eustachian tube, bilateral: Secondary | ICD-10-CM | POA: Diagnosis not present

## 2016-10-29 DIAGNOSIS — R52 Pain, unspecified: Secondary | ICD-10-CM | POA: Diagnosis not present

## 2016-10-29 DIAGNOSIS — J069 Acute upper respiratory infection, unspecified: Secondary | ICD-10-CM | POA: Diagnosis not present

## 2016-10-29 LAB — COMPREHENSIVE METABOLIC PANEL
ALBUMIN: 4.6 g/dL (ref 3.5–5.0)
ALK PHOS: 49 U/L (ref 38–126)
ALT: 21 U/L (ref 14–54)
ANION GAP: 8 (ref 5–15)
AST: 17 U/L (ref 15–41)
BUN: 14 mg/dL (ref 6–20)
CALCIUM: 9.8 mg/dL (ref 8.9–10.3)
CHLORIDE: 104 mmol/L (ref 101–111)
CO2: 26 mmol/L (ref 22–32)
Creatinine, Ser: 0.76 mg/dL (ref 0.44–1.00)
GFR calc Af Amer: >60 mL/min (ref >60)
GFR calc non Af Amer: >60 mL/min (ref >60)
GLUCOSE: 96 mg/dL (ref 65–99)
Potassium: 4 mmol/L (ref 3.5–5.1)
SODIUM: 138 mmol/L (ref 135–145)
Total Bilirubin: 0.4 mg/dL (ref 0.3–1.2)
Total Protein: 7.9 g/dL (ref 6.5–8.1)

## 2016-10-29 LAB — CBC WITH DIFFERENTIAL/PLATELET
BASOS ABS: 0 10*3/uL (ref 0.0–0.1)
Basophils Relative: 0 %
Eosinophils Absolute: 0.1 10*3/uL (ref 0.0–0.7)
Eosinophils Relative: 1 %
HCT: 40.8 % (ref 36.0–46.0)
HEMOGLOBIN: 13.7 g/dL (ref 12.0–15.0)
LYMPHS ABS: 1.5 10*3/uL (ref 0.7–4.0)
LYMPHS PCT: 15 %
MCH: 30.3 pg (ref 26.0–34.0)
MCHC: 33.6 g/dL (ref 30.0–36.0)
MCV: 90.3 fL (ref 78.0–100.0)
Monocytes Absolute: 0.6 10*3/uL (ref 0.1–1.0)
Monocytes Relative: 6 %
NEUTROS ABS: 8 10*3/uL — AB (ref 1.7–7.7)
NEUTROS PCT: 78 %
PLATELETS: 333 10*3/uL (ref 150–400)
RBC: 4.52 MIL/uL (ref 3.87–5.11)
RDW: 12.8 % (ref 11.5–15.5)
WBC: 10.2 10*3/uL (ref 4.0–10.5)

## 2016-10-29 LAB — POCT RAPID STREP A (OFFICE): Rapid Strep A Screen: NEGATIVE

## 2016-10-29 LAB — URINALYSIS, ROUTINE W REFLEX MICROSCOPIC
Bilirubin Urine: NEGATIVE
GLUCOSE, UA: NEGATIVE mg/dL
Hgb urine dipstick: NEGATIVE
Ketones, ur: NEGATIVE mg/dL
LEUKOCYTES UA: NEGATIVE
NITRITE: NEGATIVE
PH: 6 (ref 5.0–8.0)
Protein, ur: NEGATIVE mg/dL
SPECIFIC GRAVITY, URINE: 1.013 (ref 1.005–1.030)

## 2016-10-29 LAB — POC INFLUENZA A&B (BINAX/QUICKVUE)
INFLUENZA A, POC: NEGATIVE
Influenza B, POC: NEGATIVE

## 2016-10-29 LAB — I-STAT BETA HCG BLOOD, ED (MC, WL, AP ONLY): I-stat hCG, quantitative: <5 m[IU]/mL (ref <5)

## 2016-10-29 LAB — LIPASE, BLOOD: LIPASE: 29 U/L (ref 11–51)

## 2016-10-29 MED ORDER — ONDANSETRON 4 MG PO TBDP
4.0000 mg | ORAL_TABLET | Freq: Once | ORAL | Status: AC
  Administered 2016-10-29: 4 mg via ORAL
  Filled 2016-10-29 (×2): qty 1

## 2016-10-29 MED ORDER — ACETAMINOPHEN 325 MG PO TABS
650.0000 mg | ORAL_TABLET | Freq: Once | ORAL | Status: AC
  Administered 2016-10-29: 650 mg via ORAL
  Filled 2016-10-29: qty 2

## 2016-10-29 NOTE — Patient Instructions (Addendum)
BEFORE YOU LEAVE: -strep and flu testing -labs -follow up: 1 week with PCP  We have ordered labs or studies at this visit. It can take up to 1-2 weeks for results and processing. IF results require follow up or explanation, we will call you with instructions. Clinically stable results will be released to your [facility]. If you have not heard from [place] or cannot find your results in [facility] in 2 weeks please contact our office at [phone].  If you are not yet signed up for [facility], please consider signing up.  For the upper respiratory findings and the ear issues, please use afrin nasal spray for 3 days. Do not use longer. Start flonase 2 sprays each nostril daily of e 3 weeks.  I hope you are feeling better soon! Seek care immediately if worsening, new concerns or you are not improving with treatment.   INSTRUCTIONS FOR UPPER RESPIRATORY INFECTION:  -plenty of rest and fluids  -nasal saline wash 2-3 times daily (use prepackaged nasal saline or bottled/distilled water if making your own)   -can use tylenol (in no history of liver disease) or ibuprofen (if no history of kidney disease, bowel bleeding or significant heart disease) as directed for aches and sorethroat  -for sore throat, salt water gargles can help  -follow up if you have fevers, facial pain, tooth pain, difficulty breathing or are worsening or symptoms persist longer then expected  Upper Respiratory Infection, Adult An upper respiratory infection (URI) is also known as the common cold. It is often caused by a type of germ (virus). Colds are easily spread (contagious). You can pass it to others by kissing, coughing, sneezing, or drinking out of the same glass. Usually, you get better in 1 to 3  weeks.  However, the cough can last for even longer. HOME CARE   Only take medicine as told by your doctor. Follow instructions provided above.  Drink enough water and fluids to keep your pee (urine) clear or pale yellow.  Get  plenty of rest.  Return to work when your temperature is < 100 for 24 hours or as told by your doctor. You may use a face mask and wash your hands to stop your cold from spreading. GET HELP RIGHT AWAY IF:   After the first few days, you feel you are getting worse.  You have questions about your medicine.  You have chills, shortness of breath, or red spit (mucus).  You have pain in the face for more then 1-2 days, especially when you bend forward.  You have a fever, puffy (swollen) neck, pain when you swallow, or white spots in the back of your throat.  You have a bad headache, ear pain, sinus pain, or chest pain.  You have a high-pitched whistling sound when you breathe in and out (wheezing).  You cough up blood.  You have sore muscles or a stiff neck. MAKE SURE YOU:   Understand these instructions.  Will watch your condition.  Will get help right away if you are not doing well or get worse. Document Released: 02/03/2008 Document Revised: 11/09/2011 Document Reviewed: 11/22/2013 [facility] Patient Information 2015 [place], [place]. This information is not intended to replace advice given to you by your health care provider. Make sure you discuss any questions you have with your health care provider.

## 2016-10-29 NOTE — ED Provider Notes (Signed)
[facility] DEPT Provider Note   CSN: [id number] Arrival date & time: 10/29/16  1825     History   Chief Complaint Chief Complaint  Patient presents with  . Headache  . Joint Pain  . Nausea    HPI Brandy [name] is a 31 y.o. female.  She presents for evaluation of headache and joint pain for about a week.  She denies fever chills nausea or vomiting.  She has generalized myalgias and "joint pain".  She was seen by her PCP today who did a flu and rapid strep test which were negative.  Her PCP schedule her to have blood drawn on Monday for evaluation of arthritis, thyroid disease, and general screening examinations.  Patient is going through divorce and is somewhat stressed.  She denies depressive symptoms.  She works as a [employer] operational officer.  There are no other known modifying factors.  HPI  Past Medical History:  Diagnosis Date  . Anxiety   . Depression   . History of nephrolithiasis   . Kidney stone   . Migraine     Patient Active Problem List   Diagnosis Date Noted  . Vitamin D deficiency 07/21/2016  . B12 deficiency 07/21/2016  . Generalized anxiety disorder 06/09/2016  . Anterior neck pain 12/30/2015  . Enlarged thyroid gland 12/30/2015  . Fatigue 12/30/2015  . Routine general medical examination at a health care facility 09/27/2014  . Tendinitis of right ankle 08/14/2014  . Nausea vomiting and diarrhea 12/28/2013  . Serum calcium elevated 12/28/2013  . 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 DUB (dysfunctional uterine bleeding) 06/04/2011  . GERD (gastroesophageal reflux disease) 02/19/2011  . Depression 09/06/2008  . NEPHROLITHIASIS, HX OF 12/16/2007    Past Surgical History:  Procedure Laterality Date  . CHOLECYSTECTOMY  2014  . [place], 2004    OB History    Gravida Para Term Preterm AB Living   1 0 0 0 1 0   SAB TAB Ectopic Multiple Live Births   1 0 0 0        Obstetric Comments   Age first period 24       Home  Medications    Prior to Admission medications   Medication Sig Start Date End Date Taking? Authorizing Provider  FLUoxetine (PROZAC) 20 MG tablet Take 1 tablet (20 mg total) by mouth daily. 07/21/16  Yes Betty G Martinique, MD  Vitamin D, Ergocalciferol, (DRISDOL) 50000 units CAPS capsule Take 1 capsule (50,000 Units total) by mouth every 7 (seven) days. 10/27/16  Yes Betty G Martinique, MD    Family History Family History  Problem Relation Age of Onset  . Arthritis    . Diabetes    . Hypertension    . Lung cancer    . Colon cancer    . Breast cancer    . Diabetes Mother   . Mental illness Mother   . Asthma Mother     Social History Social History  Substance Use Topics  . Smoking status: Never Smoker  . Smokeless tobacco: Never Used  . Alcohol use No     Allergies   Penicillins   Review of Systems Review of Systems  All other systems reviewed and are negative.    Physical Exam Updated Vital Signs BP 120/77 (BP Location: Right Arm)   Pulse 81   Temp 98.3 F (36.8 C) (Oral)   Resp 16   SpO2 100%   Physical Exam  Constitutional: She is oriented to  person, place, and time. She appears well-developed and well-nourished. She appears distressed (She is uncomfortable).  HENT:  Head: Normocephalic and atraumatic.  Eyes: Conjunctivae and EOM are normal. Pupils are equal, round, and reactive to light.  Neck: Normal range of motion and phonation normal. Neck supple.  No meningismus  Cardiovascular: Normal rate and regular rhythm.   Pulmonary/Chest: Effort normal and breath sounds normal. She exhibits no tenderness.  Abdominal: Soft. She exhibits no distension. There is no tenderness. There is no guarding.  Musculoskeletal: Normal range of motion.  Neurological: She is alert and oriented to person, place, and time. She exhibits normal muscle tone.  Skin: Skin is warm and dry.  Psychiatric: Her behavior is normal. Judgment and thought content normal.  She appears sad.    Nursing note and vitals reviewed.    ED Treatments / Results  Labs (all labs ordered are listed, but only abnormal results are displayed) Labs Reviewed  URINALYSIS, ROUTINE W REFLEX MICROSCOPIC - Abnormal; Notable for the following:       Result Value   Color, Urine STRAW (*)    All other components within normal limits  CBC WITH DIFFERENTIAL/PLATELET - Abnormal; Notable for the following:    Neutro Abs 8.0 (*)    All other components within normal limits  LIPASE, BLOOD  COMPREHENSIVE METABOLIC PANEL  I-STAT BETA HCG BLOOD, ED ([facility], [facility], [facility] ONLY)    EKG  EKG Interpretation None       Radiology No results found.  Procedures Procedures (including critical care time)  Medications Ordered in ED Medications  ondansetron (ZOFRAN-ODT) disintegrating tablet 4 mg (4 mg Oral Given 10/29/16 2146)     Initial Impression / Assessment and Plan / ED Course  I have reviewed the triage vital signs and the nursing notes.  Pertinent labs & imaging results that were available during my care of the patient were reviewed by me and considered in my medical decision making (see chart for details).     Nursing Notes Reviewed/ Care Coordinated Applicable Imaging Reviewed Interpretation of Laboratory Data incorporated into ED treatment  The patient appears reasonably screened and/or stabilized for discharge and I doubt any other medical condition or other [facility] requiring further screening, evaluation, or treatment in the ED at this time prior to discharge.  Plan: Home Medications- OTC analgesia; Home Treatments- Rest, Fluids; return here if the recommended treatment, does not improve the symptoms; Recommended follow up- PCP as scheduled  Final Clinical Impressions(s) / ED Diagnoses   Final diagnoses:  None    New Prescriptions New Prescriptions   No medications on file    Daleen Bo, MD 10/29/16 2303

## 2016-10-29 NOTE — Progress Notes (Signed)
HPI:  Brandy [name] is a pleasant 31 year old here for an acute visit for:  Body aches, fatigue, nausea: -reports: chronic issues with myalgias, joint pains, "I hurt all over", headaches, sore throat, fatigue and she is frustrated that nobody can figure it out - she is worried about systemic illness, feels like she is crazy because nobody has figured out what is wrong with her -reports sore throat, neck pain in the front, body aches and fatigue worse this week with some ear fullness  -she has had occ headaches as well, not as bad currently -no fevers, vomiting, diarrhea, cough, SOB, rashes, tick bites, weakness, numbness -Past medical history significant for migraines, chronic myalgias, chronic fatigue, depression, anxiety, vitamin D deficiency  -PCP is Dr. Martinique, I have not seen her before - looks like she saw PCP for similar symptoms several months ago -She went to the emergency room for headaches recently and had a CT scan that was negative, she was referred to neurology per notes, it actually looks like she was referred to neurology in the past in 2013 for fatigue and headaches as well -FDLMP 1 week ago   ROS: See pertinent positives and negatives per HPI.  Past Medical History:  Diagnosis Date  . Anxiety   . Depression   . History of nephrolithiasis   . Kidney stone   . Migraine     Past Surgical History:  Procedure Laterality Date  . CHOLECYSTECTOMY  2014  . [place], 2004    Family History  Problem Relation Age of Onset  . Arthritis    . Diabetes    . Hypertension    . Lung cancer    . Colon cancer    . Breast cancer    . Diabetes Mother   . Mental illness Mother   . Asthma Mother     Social History   Social History  . Marital status: Married    Spouse name: N/A  . Number of children: N/A  . Years of education: N/A   Social History Main Topics  . Smoking status: Never Smoker  . Smokeless tobacco: Never Used  . Alcohol use  No  . Drug use: No  . Sexual activity: Yes    Birth control/ protection: Condom   Other Topics Concern  . None   Social History Narrative  . None     Current Outpatient Prescriptions:  .  clonazePAM (KLONOPIN) 1 MG tablet, Take 1 mg by mouth as needed. , Disp: , Rfl:  .  FLUoxetine (PROZAC) 20 MG tablet, Take 1 tablet (20 mg total) by mouth daily., Disp: 30 tablet, Rfl: 3 .  Vitamin D, Ergocalciferol, (DRISDOL) 50000 units CAPS capsule, Take 1 capsule (50,000 Units total) by mouth every 7 (seven) days., Disp: 12 capsule, Rfl: 0  EXAM:  Vitals:   10/29/16 1659  BP: (!) 100/58  Pulse: 73  Temp: 98.1 F (36.7 C)    Body mass index is 28.16 kg/m.  GENERAL: vitals reviewed and listed above, alert, oriented, appears well hydrated and in no acute distress  HEENT: atraumatic, conjunttiva clear, no obvious abnormalities on inspection of external nose and ears, normal appearance of ear canals and TMs with bilateral MEE - clear, clear nasal congestion, mild post oropharyngeal erythema with PND, no tonsillar edema or exudate, no sinus TTP  NECK: no obvious masses on inspection, no meningeal signs  LUNGS: clear to auscultation bilaterally, no wheezes, rales or rhonchi, good air movement  CV: HRRR,  no peripheral edema  MS: moves all extremities without noticeable abnormality  PSYCH: pleasant and cooperative, no obvious depression or anxiety  ASSESSMENT AND PLAN:  Discussed the following assessment and plan: More than 50% of over 25 minutes spent in total in caring for this patient was spent face-to-face with the patient, counseling and/or coordinating care.    Body aches - Plan: CBC with Differential/Platelet, Comprehensive metabolic panel, TSH, Rheumatoid Factor, Cyclic citrul peptide antibody, IgG, POC Influenza A&B(BINAX/QUICKVUE)  Dysfunction of both eustachian tubes  Upper respiratory tract infection, unspecified type - Plan: POC Influenza A&B(BINAX/QUICKVUE)  Sore  throat - Plan: POC Rapid Strep A, Culture, Group A Strep  -we discussed possible serious and likely etiologies, workup and treatment, treatment risks and return precautions -it seems she has an underlying chronic constellation of symptoms including myalgias/jt pains/fatigue/headaches,  now with and acute URI -  will treat ETD/MEE and AURI per pt instructions. Rapid flu and strep testing negative. Out of window for benefit from tamiflu if flu. -she is worried about the chronic aches and pains - we will do some labs today and have her follow up closely with her PCP to review and evaluate her chronic issues further (pending labs, she may wish to pursue a rheumatology consult also). Query Chronic Fatigue Syndrome, Fibromyalgia vs other. -Patient advised to return or notify a doctor immediately if symptoms worsen or persist or new concerns arise.  Patient Instructions  BEFORE YOU LEAVE: -strep and flu testing -labs -follow up: 1 week with PCP  We have ordered labs or studies at this visit. It can take up to 1-2 weeks for results and processing. IF results require follow up or explanation, we will call you with instructions. Clinically stable results will be released to your [facility]. If you have not heard from [place] or cannot find your results in [facility] in 2 weeks please contact our office at [phone].  If you are not yet signed up for [facility], please consider signing up.  For the upper respiratory findings and the ear issues, please use afrin nasal spray for 3 days. Do not use longer. Start flonase 2 sprays each nostril daily of e 3 weeks.  I hope you are feeling better soon! Seek care immediately if worsening, new concerns or you are not improving with treatment.   INSTRUCTIONS FOR UPPER RESPIRATORY INFECTION:  -plenty of rest and fluids  -nasal saline wash 2-3 times daily (use prepackaged nasal saline or bottled/distilled water if making your own)   -can use tylenol (in no history of  liver disease) or ibuprofen (if no history of kidney disease, bowel bleeding or significant heart disease) as directed for aches and sorethroat  -for sore throat, salt water gargles can help  -follow up if you have fevers, facial pain, tooth pain, difficulty breathing or are worsening or symptoms persist longer then expected  Upper Respiratory Infection, Adult An upper respiratory infection (URI) is also known as the common cold. It is often caused by a type of germ (virus). Colds are easily spread (contagious). You can pass it to others by kissing, coughing, sneezing, or drinking out of the same glass. Usually, you get better in 1 to 3  weeks.  However, the cough can last for even longer. HOME CARE   Only take medicine as told by your doctor. Follow instructions provided above.  Drink enough water and fluids to keep your pee (urine) clear or pale yellow.  Get plenty of rest.  Return to work when your temperature  is < 100 for 24 hours or as told by your doctor. You may use a face mask and wash your hands to stop your cold from spreading. GET HELP RIGHT AWAY IF:   After the first few days, you feel you are getting worse.  You have questions about your medicine.  You have chills, shortness of breath, or red spit (mucus).  You have pain in the face for more then 1-2 days, especially when you bend forward.  You have a fever, puffy (swollen) neck, pain when you swallow, or white spots in the back of your throat.  You have a bad headache, ear pain, sinus pain, or chest pain.  You have a high-pitched whistling sound when you breathe in and out (wheezing).  You cough up blood.  You have sore muscles or a stiff neck. MAKE SURE YOU:   Understand these instructions.  Will watch your condition.  Will get help right away if you are not doing well or get worse. Document Released: 02/03/2008 Document Revised: 11/09/2011 Document Reviewed: 11/22/2013 [facility] Patient Information 2015  [place], [place]. This information is not intended to replace advice given to you by your health care provider. Make sure you discuss any questions you have with your health care provider.              Colin Benton R., DO

## 2016-10-29 NOTE — Discharge Instructions (Signed)
Take Tylenol for pain.  Get plenty of rest, and drink a lot of fluids.  Follow-up for blood testing as scheduled on Monday.  Return here, if needed, for problems.

## 2016-10-29 NOTE — Progress Notes (Signed)
Pre visit review using our clinic review tool, if applicable. No additional management support is needed unless otherwise documented below in the visit note. 

## 2016-10-29 NOTE — ED Triage Notes (Signed)
Pt c/o daily headaches x 7 days, difficulty concentrating, motor skill deficits, nauseated, muscle pain, joint pain. Symptoms progressively steadily worsening without sudden changes. Blurred vision improving.  Negative for flu. CT scan of head was negative. No new symptoms or sudden changes to ongoing symptoms today.

## 2016-10-30 LAB — INFLUENZA PANEL BY PCR (TYPE A & B)
Influenza A By PCR: NEGATIVE
Influenza B By PCR: NEGATIVE

## 2016-10-31 LAB — CULTURE, GROUP A STREP

## 2016-11-02 ENCOUNTER — Other Ambulatory Visit: Payer: Managed Care, Other (non HMO)

## 2016-11-02 LAB — CBC WITH DIFFERENTIAL/PLATELET
BASOS ABS: 0 10*3/uL (ref 0.0–0.1)
Basophils Relative: 0.6 % (ref 0.0–3.0)
EOS ABS: 0 10*3/uL (ref 0.0–0.7)
Eosinophils Relative: 0.5 % (ref 0.0–5.0)
HCT: 38.7 % (ref 36.0–46.0)
Hemoglobin: 13.3 g/dL (ref 12.0–15.0)
LYMPHS ABS: 1.6 10*3/uL (ref 0.7–4.0)
Lymphocytes Relative: 19.5 % (ref 12.0–46.0)
MCHC: 34.3 g/dL (ref 30.0–36.0)
MCV: 90.3 fl (ref 78.0–100.0)
MONO ABS: 0.4 10*3/uL (ref 0.1–1.0)
Monocytes Relative: 4.8 % (ref 3.0–12.0)
NEUTROS ABS: 6.2 10*3/uL (ref 1.4–7.7)
NEUTROS PCT: 74.6 % (ref 43.0–77.0)
PLATELETS: 302 10*3/uL (ref 150.0–400.0)
RBC: 4.29 Mil/uL (ref 3.87–5.11)
RDW: 12.8 % (ref 11.5–15.5)
WBC: 8.3 10*3/uL (ref 4.0–10.5)

## 2016-11-02 LAB — COMPREHENSIVE METABOLIC PANEL
ALT: 13 U/L (ref 0–35)
AST: 13 U/L (ref 0–37)
Albumin: 4.3 g/dL (ref 3.5–5.2)
Alkaline Phosphatase: 47 U/L (ref 39–117)
BILIRUBIN TOTAL: 0.5 mg/dL (ref 0.2–1.2)
BUN: 11 mg/dL (ref 6–23)
CO2: 29 meq/L (ref 19–32)
CREATININE: 0.84 mg/dL (ref 0.40–1.20)
Calcium: 9.5 mg/dL (ref 8.4–10.5)
Chloride: 101 mEq/L (ref 96–112)
GFR: 84.8 mL/min (ref >60.00)
GLUCOSE: 126 mg/dL — AB (ref 70–99)
Potassium: 3.5 mEq/L (ref 3.5–5.1)
Sodium: 139 mEq/L (ref 135–145)
Total Protein: 6.8 g/dL (ref 6.0–8.3)

## 2016-11-02 LAB — TSH: TSH: 1.39 u[IU]/mL (ref 0.35–4.50)

## 2016-11-03 LAB — CYCLIC CITRUL PEPTIDE ANTIBODY, IGG: Cyclic Citrullin Peptide Ab: <16 Units

## 2016-11-03 LAB — RHEUMATOID FACTOR: Rhuematoid fact SerPl-aCnc: <14 IU/mL (ref <14)

## 2016-11-04 NOTE — Progress Notes (Deleted)
     HPI:   ACUTE VISIT:  No chief complaint on file.   [name] is a 31 y.o. female, who is here today complaining of ***  She was seen on 10/29/16 c/o myalgias,fatigue,and nausea.  Also she was evaluated in the ER same day c/o headache, arthralgias,and nausea.   Lab Results  Component Value Date   WBC 8.3 11/02/2016   HGB 13.3 11/02/2016   HCT 38.7 11/02/2016   MCV 90.3 11/02/2016   PLT 302.0 11/02/2016   ER visit 10/25/16 c/o frontal pressure headache with associated nausea. ER visit 09/04/16 c/o body aches and headache.   Head CT: Negative (10/25/16).  10/02/2011 she was referred to neuro due to migraine headaches and fatigue.  2013 brain MRI: No acute intracranial findings, specifically no evidence of acute infarct, hemorrhage, or mass.   Results were discussed with Dr. Allayne Butcher at 2:50 p.m. on September 28, 2011 by Dr. Marshell Levan. . -Vit D deficiency: ***   Review of Systems    Current Outpatient Prescriptions on File Prior to Visit  Medication Sig Dispense Refill  . FLUoxetine (PROZAC) 20 MG tablet Take 1 tablet (20 mg total) by mouth daily. 30 tablet 3  . Vitamin D, Ergocalciferol, (DRISDOL) 50000 units CAPS capsule Take 1 capsule (50,000 Units total) by mouth every 7 (seven) days. 12 capsule 0   No current facility-administered medications on file prior to visit.      Past Medical History:  Diagnosis Date  . Anxiety   . Depression   . History of nephrolithiasis   . Kidney stone   . Migraine    Allergies  Allergen Reactions  . Penicillins Hives    Childhood reaction Has patient had a PCN reaction causing immediate rash, facial/tongue/throat swelling, SOB or lightheadedness with hypotension: No Has patient had a PCN reaction causing severe rash involving mucus membranes or skin necrosis: No Has patient had a PCN reaction that required hospitalization No Has patient had a PCN reaction occurring within the last 10 years:  No If all of the above answers are "NO", then may proceed with Cephalosporin use.     Social History   Social History  . Marital status: Married    Spouse name: N/A  . Number of children: N/A  . Years of education: N/A   Social History Main Topics  . Smoking status: Never Smoker  . Smokeless tobacco: Never Used  . Alcohol use No  . Drug use: No  . Sexual activity: Yes    Birth control/ protection: Condom   Other Topics Concern  . Not on file   Social History Narrative  . No narrative on file    There were no vitals filed for this visit. There is no height or weight on file to calculate BMI.      Physical Exam    ASSESSMENT AND PLAN:     There are no diagnoses linked to this encounter.      No Follow-up on file.     -[name] Reasons advised to return or notify a doctor immediately if symptoms worsen or persist or new concerns arise.       Brandy Rotunno G. Martinique, MD  [facility]. [place] office.

## 2016-11-05 ENCOUNTER — Ambulatory Visit: Payer: Managed Care, Other (non HMO) | Admitting: Family Medicine

## 2017-01-23 ENCOUNTER — Emergency Department (HOSPITAL_COMMUNITY)
Admission: EM | Admit: 2017-01-23 | Discharge: 2017-01-23 | Disposition: A | Discharge status: Home / Self Care | Payer: Managed Care, Other (non HMO) | Attending: Emergency Medicine | Admitting: Emergency Medicine

## 2017-01-23 ENCOUNTER — Encounter (HOSPITAL_COMMUNITY): Payer: Self-pay | Admitting: Emergency Medicine

## 2017-01-23 ENCOUNTER — Emergency Department (HOSPITAL_COMMUNITY): Payer: Managed Care, Other (non HMO)

## 2017-01-23 DIAGNOSIS — G43909 Migraine, unspecified, not intractable, without status migrainosus: Secondary | ICD-10-CM | POA: Diagnosis not present

## 2017-01-23 DIAGNOSIS — R51 Headache: Secondary | ICD-10-CM

## 2017-01-23 DIAGNOSIS — Z79899 Other long term (current) drug therapy: Secondary | ICD-10-CM | POA: Insufficient documentation

## 2017-01-23 DIAGNOSIS — R519 Headache, unspecified: Secondary | ICD-10-CM

## 2017-01-23 DIAGNOSIS — R1010 Upper abdominal pain, unspecified: Secondary | ICD-10-CM | POA: Diagnosis present

## 2017-01-23 DIAGNOSIS — R1013 Epigastric pain: Secondary | ICD-10-CM

## 2017-01-23 LAB — BASIC METABOLIC PANEL
ANION GAP: 7 (ref 5–15)
BUN: 9 mg/dL (ref 6–20)
CHLORIDE: 106 mmol/L (ref 101–111)
CO2: 23 mmol/L (ref 22–32)
Calcium: 9 mg/dL (ref 8.9–10.3)
Creatinine, Ser: 0.82 mg/dL (ref 0.44–1.00)
Glucose, Bld: 105 mg/dL — ABNORMAL HIGH (ref 65–99)
POTASSIUM: 3.2 mmol/L — AB (ref 3.5–5.1)
SODIUM: 136 mmol/L (ref 135–145)

## 2017-01-23 LAB — CBC
HEMATOCRIT: 39.9 % (ref 36.0–46.0)
Hemoglobin: 13.1 g/dL (ref 12.0–15.0)
MCH: 29.8 pg (ref 26.0–34.0)
MCHC: 32.8 g/dL (ref 30.0–36.0)
MCV: 90.9 fL (ref 78.0–100.0)
Platelets: 314 10*3/uL (ref 150–400)
RBC: 4.39 MIL/uL (ref 3.87–5.11)
RDW: 12.3 % (ref 11.5–15.5)
WBC: 8.3 10*3/uL (ref 4.0–10.5)

## 2017-01-23 LAB — POC URINE PREG, ED: PREG TEST UR: NEGATIVE

## 2017-01-23 LAB — I-STAT TROPONIN, ED: Troponin i, poc: 0 ng/mL (ref 0.00–0.08)

## 2017-01-23 MED ORDER — METOCLOPRAMIDE HCL 10 MG PO TABS
10.0000 mg | ORAL_TABLET | Freq: Once | ORAL | Status: AC
  Administered 2017-01-23: 10 mg via ORAL
  Filled 2017-01-23: qty 1

## 2017-01-23 MED ORDER — METOCLOPRAMIDE HCL 10 MG PO TABS: 10.0000 mg | ORAL_TABLET | Freq: Four times a day (QID) | ORAL | 0 refills | Status: DC

## 2017-01-23 MED ORDER — POTASSIUM CHLORIDE CRYS ER 20 MEQ PO TBCR
40.0000 meq | EXTENDED_RELEASE_TABLET | Freq: Once | ORAL | Status: AC
  Administered 2017-01-23: 40 meq via ORAL
  Filled 2017-01-23: qty 2

## 2017-01-23 MED ORDER — GI COCKTAIL ~~LOC~~
30.0000 mL | Freq: Once | ORAL | Status: AC
  Administered 2017-01-23: 30 mL via ORAL
  Filled 2017-01-23: qty 30

## 2017-01-23 MED ORDER — SUCRALFATE 1 G PO TABS: 1.0000 g | ORAL_TABLET | Freq: Three times a day (TID) | ORAL | 0 refills | Status: DC

## 2017-01-23 MED ORDER — ONDANSETRON 4 MG PO TBDP
ORAL_TABLET | ORAL | Status: AC
  Filled 2017-01-23: qty 1

## 2017-01-23 MED ORDER — ONDANSETRON 4 MG PO TBDP
4.0000 mg | ORAL_TABLET | Freq: Once | ORAL | Status: AC | PRN
  Administered 2017-01-23: 4 mg via ORAL

## 2017-01-23 NOTE — ED Notes (Signed)
Pt ambulatory to restroom

## 2017-01-23 NOTE — Discharge Instructions (Signed)
It was my pleasure taking care of you today!   Continue taking your omeprazole daily. Carafate up to 4 times a day for abdominal pain. Reglan as needed for nausea/headaches. As we discussed, I have given you a referral to the neurologist for further discussion of your migraine headaches. You will also need to follow up with your primary care provider for further discussion of your abdominal pain.   It is VERY important that you monitor your symptoms and return to the Emergency Department if you develop any of the following symptoms:  The pain does not go away.  You have a fever.  You keep throwing up and can't keep fluids down.  You pass bloody or black tarry stools.  There is bright red blood in the stool. You do not seem to be getting better.  You have any questions or concerns.

## 2017-01-23 NOTE — ED Triage Notes (Signed)
C/o intermittent pain to center of chest that radiates to back x 3 days (dull and sharp pain).  States it is hard to take a deep breath, reports nausea, and headache.  States headache unrelieved with OTC meds.

## 2017-01-23 NOTE — ED Provider Notes (Signed)
[facility] DEPT Provider Note   CSN: [id number] Arrival date & time: 01/23/17  0353     History   Chief Complaint Chief Complaint  Patient presents with  . Chest Pain  . Headache    HPI Samara Stankowski is a 31 y.o. female.  The history is provided by the patient and medical records. No language interpreter was used.  Chest Pain   Associated symptoms include abdominal pain, headaches and nausea. Pertinent negatives include no palpitations and no vomiting.  Headache   Associated symptoms include nausea. Pertinent negatives include no palpitations and no vomiting.   Natallia Stellmach is a 31 y.o. female  with a PMH of anxiety, depression and migraines who presents to the Emergency Department complaining of upper abdominal and chest pain x 3 days. Described as sharp, "comes and goes" and will intermittently radiate to the back and shoulder blades. Associated with nausea, no vomiting. Pain is worse with deep breaths and eating. Takes omeprazole daily for reflux, but states this feels much more severe than usual reflux. She also endorses intermittent migraine headaches for the last 3 days. She's been taking Excedrin Migraine without relief. She took 3 Excedrin last night and now headache has resolved. Denies dizziness, shortness of breath, cough, diarrhea, constipation, fevers/chills. No leg swelling, recent travel, recent surgeries, hormone use. She has had her bladder removed (3 years ago) with no complications since procedure.  Past Medical History:  Diagnosis Date  . Anxiety   . Depression   . History of nephrolithiasis   . Kidney stone   . Migraine     Patient Active Problem List   Diagnosis Date Noted  . Vitamin D deficiency 07/21/2016  . B12 deficiency 07/21/2016  . Generalized anxiety disorder 06/09/2016  . Anterior neck pain 12/30/2015  . Enlarged thyroid gland 12/30/2015  . Fatigue 12/30/2015  . Routine general medical examination at a health care  facility 09/27/2014  . Tendinitis of right ankle 08/14/2014  . Nausea vomiting and diarrhea 12/28/2013  . Serum calcium elevated 12/28/2013  . 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 DUB (dysfunctional uterine bleeding) 06/04/2011  . GERD (gastroesophageal reflux disease) 02/19/2011  . Depression 09/06/2008  . NEPHROLITHIASIS, HX OF 12/16/2007    Past Surgical History:  Procedure Laterality Date  . CHOLECYSTECTOMY  2014  . [place], 2004    OB History    Gravida Para Term Preterm AB Living   1 0 0 0 1 0   SAB TAB Ectopic Multiple Live Births   1 0 0 0        Obstetric Comments   Age first period 45       Home Medications    Prior to Admission medications   Medication Sig Start Date End Date Taking? Authorizing Provider  FLUoxetine (PROZAC) 20 MG tablet Take 1 tablet (20 mg total) by mouth daily. 07/21/16  Yes Martinique, Betty G, MD  omeprazole (PRILOSEC OTC) 20 MG tablet Take 20 mg by mouth daily.   Yes [provider]  Vitamin D, Ergocalciferol, (DRISDOL) 50000 units CAPS capsule Take 1 capsule (50,000 Units total) by mouth every 7 (seven) days. Patient taking differently: Take 50,000 Units by mouth every Friday.  10/27/16  Yes Martinique, Betty G, MD  metoCLOPramide (REGLAN) 10 MG tablet Take 1 tablet (10 mg total) by mouth every 6 (six) hours. 01/23/17   Kaliann Coryell, Ozella Almond, PA-C  sucralfate (CARAFATE) 1 g tablet Take 1 tablet (1 g total) by mouth 4 (four)  times daily -  with meals and at bedtime. 01/23/17   Branston Halsted, Ozella Almond, PA-C    Family History Family History  Problem Relation Age of Onset  . Arthritis Unknown   . Diabetes Unknown   . Hypertension Unknown   . Lung cancer Unknown   . Colon cancer Unknown   . Breast cancer Unknown   . Diabetes Mother   . Mental illness Mother   . Asthma Mother     Social History Social History  Substance Use Topics  . Smoking status: Never Smoker  . Smokeless tobacco: Never Used  . Alcohol use No      Allergies   Penicillins   Review of Systems Review of Systems  Cardiovascular: Positive for chest pain. Negative for palpitations and leg swelling.  Gastrointestinal: Positive for abdominal pain and nausea. Negative for blood in stool, constipation, diarrhea and vomiting.  Neurological: Positive for headaches.     Physical Exam Updated Vital Signs BP 101/68 (BP Location: Right Arm)   Pulse 74   Temp 98.7 F (37.1 C) (Oral)   Resp 18   LMP 01/11/2017 (Exact Date)   SpO2 98%   Physical Exam  Constitutional: She is oriented to person, place, and time. She appears well-developed and well-nourished. No distress.  HENT:  Head: Normocephalic and atraumatic.  Neck: Neck supple. No JVD present.  Cardiovascular: Normal rate, regular rhythm and normal heart sounds.   No murmur heard. Pulmonary/Chest: Effort normal and breath sounds normal. No respiratory distress. She has no wheezes. She has no rales.  Abdominal: Soft. She exhibits no distension.  Tenderness to palpation of the epigastrium without rebound or guarding. Negative Murphy's.  Musculoskeletal: She exhibits no edema.  Neurological: She is alert and oriented to person, place, and time.  Skin: Skin is warm and dry.  Nursing note and vitals reviewed.    ED Treatments / Results  Labs (all labs ordered are listed, but only abnormal results are displayed) Labs Reviewed  BASIC METABOLIC PANEL - Abnormal; Notable for the following:       Result Value   Potassium 3.2 (*)    Glucose, Bld 105 (*)    All other components within normal limits  CBC  I-STAT TROPOININ, ED  POC URINE PREG, ED    EKG  EKG Interpretation  Date/Time:  Saturday Jan 23 2017 04:02:51 EDT Ventricular Rate:  101 PR Interval:  140 QRS Duration: 76 QT Interval:  344 QTC Calculation: 446 R Axis:   73 Text Interpretation:  Sinus tachycardia Otherwise normal ECG No acute changes No significant change since last tracing Confirmed by Varney Biles [phone]) on 01/23/2017 6:29:08 AM Also confirmed by Varney Biles [phone]), editor Drema Pry [phone])  on 01/23/2017 8:04:07 AM       Radiology Dg Chest 2 View  Result Date: 01/23/2017 CLINICAL DATA:  Intermittent central chest pain for 3 days. Dyspnea, headache. EXAM: CHEST  2 VIEW COMPARISON:  Chest radiograph November 24, 2016 FINDINGS: Cardiomediastinal silhouette is normal. No pleural effusions or focal consolidations. Trachea projects midline and there is no pneumothorax. Soft tissue planes and included osseous structures are non-suspicious. Surgical clips in the included right abdomen compatible with cholecystectomy. IMPRESSION: Stable examination: Normal chest. Electronically Signed   By: Elon Alas M.D.   On: 01/23/2017 04:47    Procedures Procedures (including critical care time)  Medications Ordered in ED Medications  ondansetron (ZOFRAN-ODT) disintegrating tablet 4 mg (4 mg Oral Given 01/23/17 0408)  gi cocktail (Maalox,Lidocaine,Donnatal) (30 mLs  Oral Given 01/23/17 0702)  metoCLOPramide (REGLAN) tablet 10 mg (10 mg Oral Given 01/23/17 0702)  potassium chloride SA (K-DUR,KLOR-CON) CR tablet 40 mEq (40 mEq Oral Given 01/23/17 0755)     Initial Impression / Assessment and Plan / ED Course  I have reviewed the triage vital signs and the nursing notes.  Pertinent labs & imaging results that were available during my care of the patient were reviewed by me and considered in my medical decision making (see chart for details).    Kayal Mula is a 31 y.o. female who presents to ED for chest pain and upper abdominal pain x 3 days. On exam, patient is afebrile, hemodynamically stable with non-surgical abdomen. Does have tenderness to palpation of epigastrium. Negative Murphy's. Labs reviewed and reassuring. K+ of 3.2 - replenished in ED. CXR negative. Trop negative. EKG non-ischemic and unchanged from previous. Doubt cardiopulmonary etiology. Appears more GI.  Given reglan and GI cocktail. On re-evaluation, patient has noticeable improvement. No tenderness on repeat abdominal exam. Evaluation does not show pathology that would require ongoing emergent intervention or inpatient treatment. Will dc home with rx for reglan and carafate. Follow up with PCP. Patient has been having increased frequency of migraines, however no headache right now. Requesting neurology referral for migraines. Referral given. Reasons to return to ER discussed and all questions answered.  Patient discussed with Dr. Kathrynn Humble who agrees with treatment plan.   Final Clinical Impressions(s) / ED Diagnoses   Final diagnoses:  Epigastric pain  Bad headache    New Prescriptions New Prescriptions   METOCLOPRAMIDE (REGLAN) 10 MG TABLET    Take 1 tablet (10 mg total) by mouth every 6 (six) hours.   SUCRALFATE (CARAFATE) 1 G TABLET    Take 1 tablet (1 g total) by mouth 4 (four) times daily -  with meals and at bedtime.     Aveion Nguyen, Ozella Almond, PA-C 01/23/17 4132    Varney Biles, MD 01/26/17 2220

## 2017-01-23 NOTE — ED Notes (Signed)
Pt states she understands instructions. Home stable with family with steady gait.  

## 2017-02-10 ENCOUNTER — Emergency Department (HOSPITAL_COMMUNITY): Payer: Managed Care, Other (non HMO)

## 2017-02-10 ENCOUNTER — Encounter (HOSPITAL_COMMUNITY): Payer: Self-pay | Admitting: Emergency Medicine

## 2017-02-10 ENCOUNTER — Emergency Department (HOSPITAL_COMMUNITY)
Admission: EM | Admit: 2017-02-10 | Discharge: 2017-02-10 | Disposition: A | Discharge status: Home / Self Care | Payer: Managed Care, Other (non HMO) | Attending: Emergency Medicine | Admitting: Emergency Medicine

## 2017-02-10 DIAGNOSIS — R112 Nausea with vomiting, unspecified: Secondary | ICD-10-CM | POA: Insufficient documentation

## 2017-02-10 DIAGNOSIS — R1013 Epigastric pain: Secondary | ICD-10-CM

## 2017-02-10 DIAGNOSIS — Z87442 Personal history of urinary calculi: Secondary | ICD-10-CM | POA: Insufficient documentation

## 2017-02-10 DIAGNOSIS — F419 Anxiety disorder, unspecified: Secondary | ICD-10-CM | POA: Insufficient documentation

## 2017-02-10 DIAGNOSIS — Z79899 Other long term (current) drug therapy: Secondary | ICD-10-CM | POA: Insufficient documentation

## 2017-02-10 LAB — COMPREHENSIVE METABOLIC PANEL
ALBUMIN: 3.8 g/dL (ref 3.5–5.0)
ALT: 18 U/L (ref 14–54)
AST: 17 U/L (ref 15–41)
Alkaline Phosphatase: 50 U/L (ref 38–126)
Anion gap: 9 (ref 5–15)
BUN: 11 mg/dL (ref 6–20)
CHLORIDE: 103 mmol/L (ref 101–111)
CO2: 24 mmol/L (ref 22–32)
CREATININE: 0.8 mg/dL (ref 0.44–1.00)
Calcium: 8.8 mg/dL — ABNORMAL LOW (ref 8.9–10.3)
GFR calc Af Amer: >60 mL/min (ref >60)
Glucose, Bld: 111 mg/dL — ABNORMAL HIGH (ref 65–99)
POTASSIUM: 3.2 mmol/L — AB (ref 3.5–5.1)
SODIUM: 136 mmol/L (ref 135–145)
Total Bilirubin: 0.7 mg/dL (ref 0.3–1.2)
Total Protein: 7.1 g/dL (ref 6.5–8.1)

## 2017-02-10 LAB — CBC WITH DIFFERENTIAL/PLATELET
BASOS ABS: 0 10*3/uL (ref 0.0–0.1)
BASOS PCT: 1 %
EOS ABS: 0.1 10*3/uL (ref 0.0–0.7)
EOS PCT: 2 %
HCT: 39.7 % (ref 36.0–46.0)
Hemoglobin: 12.9 g/dL (ref 12.0–15.0)
LYMPHS PCT: 31 %
Lymphs Abs: 2.5 10*3/uL (ref 0.7–4.0)
MCH: 29.7 pg (ref 26.0–34.0)
MCHC: 32.5 g/dL (ref 30.0–36.0)
MCV: 91.3 fL (ref 78.0–100.0)
MONO ABS: 0.7 10*3/uL (ref 0.1–1.0)
Monocytes Relative: 9 %
Neutro Abs: 4.6 10*3/uL (ref 1.7–7.7)
Neutrophils Relative %: 57 %
PLATELETS: 296 10*3/uL (ref 150–400)
RBC: 4.35 MIL/uL (ref 3.87–5.11)
RDW: 12.7 % (ref 11.5–15.5)
WBC: 8 10*3/uL (ref 4.0–10.5)

## 2017-02-10 LAB — URINALYSIS, ROUTINE W REFLEX MICROSCOPIC
Bilirubin Urine: NEGATIVE
GLUCOSE, UA: NEGATIVE mg/dL
KETONES UR: NEGATIVE mg/dL
LEUKOCYTES UA: NEGATIVE
Nitrite: NEGATIVE
PROTEIN: 30 mg/dL — AB
Specific Gravity, Urine: 1.002 — ABNORMAL LOW (ref 1.005–1.030)
pH: 6 (ref 5.0–8.0)

## 2017-02-10 LAB — I-STAT BETA HCG BLOOD, ED (MC, WL, AP ONLY)

## 2017-02-10 LAB — LIPASE, BLOOD: LIPASE: 32 U/L (ref 11–51)

## 2017-02-10 MED ORDER — FAMOTIDINE IN NACL 20-0.9 MG/50ML-% IV SOLN
20.0000 mg | Freq: Once | INTRAVENOUS | Status: AC
  Administered 2017-02-10: 20 mg via INTRAVENOUS
  Filled 2017-02-10: qty 50

## 2017-02-10 MED ORDER — MORPHINE SULFATE (PF) 4 MG/ML IV SOLN
4.0000 mg | Freq: Once | INTRAVENOUS | Status: AC
  Administered 2017-02-10: 4 mg via INTRAVENOUS
  Filled 2017-02-10: qty 1

## 2017-02-10 MED ORDER — FAMOTIDINE 20 MG PO TABS: 20.0000 mg | ORAL_TABLET | Freq: Two times a day (BID) | ORAL | 0 refills | Status: DC

## 2017-02-10 MED ORDER — PANTOPRAZOLE SODIUM 20 MG PO TBEC: 20.0000 mg | DELAYED_RELEASE_TABLET | Freq: Every day | ORAL | 1 refills | Status: DC

## 2017-02-10 MED ORDER — ONDANSETRON HCL 4 MG/2ML IJ SOLN
4.0000 mg | Freq: Once | INTRAMUSCULAR | Status: AC
  Administered 2017-02-10: 4 mg via INTRAVENOUS
  Filled 2017-02-10: qty 2

## 2017-02-10 MED ORDER — ONDANSETRON 4 MG PO TBDP: 4.0000 mg | ORAL_TABLET | Freq: Three times a day (TID) | ORAL | 0 refills | Status: DC | PRN

## 2017-02-10 MED ORDER — IOPAMIDOL (ISOVUE-300) INJECTION 61%
INTRAVENOUS | Status: AC
  Administered 2017-02-10: 100 mL
  Filled 2017-02-10: qty 100

## 2017-02-10 MED ORDER — PANTOPRAZOLE SODIUM 40 MG IV SOLR
40.0000 mg | Freq: Once | INTRAVENOUS | Status: AC
  Administered 2017-02-10: 40 mg via INTRAVENOUS
  Filled 2017-02-10: qty 40

## 2017-02-10 NOTE — ED Triage Notes (Signed)
Patient with abdominal pain that woke her up from sleep, sharp and stabbing in nature in the center of her abdomen.  She states that she has had kidney stones before but this feels nothing like that pain.  She does have nausea and vomited x1, stated that it was bile.

## 2017-02-10 NOTE — Discharge Instructions (Signed)
You have been seen today for abdominal pain and vomiting. Your CT showed no evidence of acute abnormalities. Please follow up with the GI specialist. Call to make an appointment.  Take the protonix 20-30 before your first meal of the day. Initiate a bland diet and/or liquid diet. Avoid ibuprofen or naproxen, alcohol, and tobacco. Zofran as needed for vomiting. Be sure to stay well-hydrated. Return to the ED should symptoms worsen.

## 2017-02-10 NOTE — ED Notes (Signed)
Pt given ginger ale and crackers for PO challange

## 2017-02-10 NOTE — ED Notes (Signed)
Patient transported to CT 

## 2017-02-10 NOTE — ED Provider Notes (Signed)
[facility] DEPT Provider Note   CSN: [id number] Arrival date & time: 02/10/17  0358     History   Chief Complaint Chief Complaint  Patient presents with  . Abdominal Pain    HPI Brandy [name] is a 31 y.o. female.  HPI   Brandy [name] is a 31 y.o. female, with a history of frequent kidney stones and cholecystectomy, presenting to the ED with abdominal pain for the past few days. Woke up with upper midline abdominal pain at 3:30pm this morning. Pain alternates sharp/dull, currently 6/10, nonradiating. Endorses nausea, vomiting, constipation, and a bad taste in her mouth, even when not vomiting. Last BM was here in the ED and was "rabbit pellets." Last normal BM was three days ago. States that she began having similar pain over the past couple weeks. Before this time she never had pain in her abdomen. States today's pain is worse than it's ever been.  Denies use of alcohol or illicit drugs, including marijuana. Denies regular NSAID use. Started menstrual cycle yesterday.    Patient was seen in the ED on 5/26 for similar complaint. Has been taking the recommended carafate and prilosec.  Denies fever/chills, diarrhea, hematochezia/melena, abnormal vaginal discharge, urinary symptoms, or any other complaints.     Past Medical History:  Diagnosis Date  . Anxiety   . Depression   . History of nephrolithiasis   . Kidney stone   . Migraine     Patient Active Problem List   Diagnosis Date Noted  . Vitamin D deficiency 07/21/2016  . B12 deficiency 07/21/2016  . Generalized anxiety disorder 06/09/2016  . Anterior neck pain 12/30/2015  . Enlarged thyroid gland 12/30/2015  . Fatigue 12/30/2015  . Routine general medical examination at a health care facility 09/27/2014  . Tendinitis of right ankle 08/14/2014  . Nausea vomiting and diarrhea 12/28/2013  . Serum calcium elevated 12/28/2013  . 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 DUB (dysfunctional uterine bleeding)  06/04/2011  . GERD (gastroesophageal reflux disease) 02/19/2011  . Depression 09/06/2008  . NEPHROLITHIASIS, HX OF 12/16/2007    Past Surgical History:  Procedure Laterality Date  . CHOLECYSTECTOMY  2014  . [place], 2004    OB History    Gravida Para Term Preterm AB Living   1 0 0 0 1 0   SAB TAB Ectopic Multiple Live Births   1 0 0 0        Obstetric Comments   Age first period 37       Home Medications    Prior to Admission medications   Medication Sig Start Date End Date Taking? Authorizing Provider  famotidine (PEPCID) 20 MG tablet Take 1 tablet (20 mg total) by mouth 2 (two) times daily. 02/10/17 02/17/17  Shonia Skilling C, PA-C  FLUoxetine (PROZAC) 20 MG tablet Take 1 tablet (20 mg total) by mouth daily. 07/21/16   Martinique, Betty G, MD  metoCLOPramide (REGLAN) 10 MG tablet Take 1 tablet (10 mg total) by mouth every 6 (six) hours. 01/23/17   Ward, Ozella Almond, PA-C  ondansetron (ZOFRAN ODT) 4 MG disintegrating tablet Take 1 tablet (4 mg total) by mouth every 8 (eight) hours as needed for nausea or vomiting. 02/10/17   Ashyra Cantin C, PA-C  pantoprazole (PROTONIX) 20 MG tablet Take 1 tablet (20 mg total) by mouth daily. 02/10/17   Boleslaw Borghi C, PA-C  sucralfate (CARAFATE) 1 g tablet Take 1 tablet (1 g total) by mouth 4 (four) times daily -  with meals and at bedtime. 01/23/17   Ward, Ozella Almond, PA-C  Vitamin D, Ergocalciferol, (DRISDOL) 50000 units CAPS capsule Take 1 capsule (50,000 Units total) by mouth every 7 (seven) days. Patient taking differently: Take 50,000 Units by mouth every Friday.  10/27/16   Martinique, Betty G, MD    Family History Family History  Problem Relation Age of Onset  . Arthritis Unknown   . Diabetes Unknown   . Hypertension Unknown   . Lung cancer Unknown   . Colon cancer Unknown   . Breast cancer Unknown   . Diabetes Mother   . Mental illness Mother   . Asthma Mother     Social History Social History  Substance Use Topics    . Smoking status: Never Smoker  . Smokeless tobacco: Never Used  . Alcohol use No     Allergies   Penicillins   Review of Systems Review of Systems  Constitutional: Negative for chills, diaphoresis and fever.  Respiratory: Negative for shortness of breath.   Cardiovascular: Negative for chest pain.  Gastrointestinal: Positive for abdominal pain, constipation, nausea and vomiting. Negative for diarrhea.  Genitourinary: Negative for dysuria, flank pain and vaginal discharge.  All other systems reviewed and are negative.    Physical Exam Updated Vital Signs BP 125/84 (BP Location: Right Arm)   Pulse (!) 115   Temp 98.1 F (36.7 C) (Oral)   LMP 02/09/2017 (Exact Date)   SpO2 99%   Physical Exam  Constitutional: She appears well-developed and well-nourished. No distress.  HENT:  Head: Normocephalic and atraumatic.  Eyes: Conjunctivae are normal.  Neck: Neck supple.  Cardiovascular: Normal rate, regular rhythm, normal heart sounds and intact distal pulses.   Pulmonary/Chest: Effort normal and breath sounds normal. No respiratory distress.  Abdominal: Soft. There is tenderness. There is no guarding.    Musculoskeletal: She exhibits no edema.  Lymphadenopathy:    She has no cervical adenopathy.  Neurological: She is alert.  Skin: Skin is warm and dry. She is not diaphoretic.  Psychiatric: She has a normal mood and affect. Her behavior is normal.  Nursing note and vitals reviewed.    ED Treatments / Results  Labs (all labs ordered are listed, but only abnormal results are displayed) Labs Reviewed  COMPREHENSIVE METABOLIC PANEL - Abnormal; Notable for the following:       Result Value   Potassium 3.2 (*)    Glucose, Bld 111 (*)    Calcium 8.8 (*)    All other components within normal limits  URINALYSIS, ROUTINE W REFLEX MICROSCOPIC - Abnormal; Notable for the following:    Color, Urine PINK (*)    APPearance HAZY (*)    Specific Gravity, Urine 1.002 (*)    Hgb  urine dipstick LARGE (*)    Protein, ur 30 (*)    Bacteria, UA MANY (*)    Squamous Epithelial / LPF 0-5 (*)    All other components within normal limits  CBC WITH DIFFERENTIAL/PLATELET  LIPASE, BLOOD  I-STAT BETA HCG BLOOD, ED ([facility], [facility], [facility] ONLY)    EKG  EKG Interpretation None       Radiology Ct Abdomen Pelvis W Contrast  Result Date: 02/10/2017 CLINICAL DATA:  Acute onset of central abdominal pain. Nausea and vomiting. EXAM: CT ABDOMEN AND PELVIS WITH CONTRAST TECHNIQUE: Multidetector CT imaging of the abdomen and pelvis was performed using the standard protocol following bolus administration of intravenous contrast. CONTRAST:  124mL ISOVUE-300 IOPAMIDOL (ISOVUE-300) INJECTION 61% COMPARISON:  Pelvic ultrasound  performed 07/20/2016, and CT of the abdomen and pelvis performed 04/17/2016 FINDINGS: Lower chest: The visualized lung bases are grossly clear. The visualized portions of the mediastinum are unremarkable. Hepatobiliary: The liver is unremarkable in appearance. The patient is status post cholecystectomy, with clips noted at the gallbladder fossa. The common bile duct remains normal in caliber. Pancreas: The pancreas is within normal limits. Spleen: The spleen is unremarkable in appearance. Adrenals/Urinary Tract: The adrenal glands are unremarkable in appearance. A mildly prominent left renal pelvis is noted. A 3 mm nonobstructing stone is noted at the interpole region of the right kidney. There is no evidence of hydronephrosis. No obstructing ureteral stones are identified. No perinephric stranding is seen. Stomach/Bowel: The stomach is unremarkable in appearance. The small bowel is within normal limits. The appendix is normal in caliber, without evidence of appendicitis. The colon is unremarkable in appearance. Vascular/Lymphatic: The abdominal aorta is unremarkable in appearance. The inferior vena cava is grossly unremarkable. No retroperitoneal lymphadenopathy is seen. No pelvic  sidewall lymphadenopathy is identified. Reproductive: The bladder is mildly distended and within normal limits. The uterus is grossly unremarkable in appearance. The ovaries are relatively symmetric. No suspicious adnexal masses are seen. Other: No additional soft tissue abnormalities are seen. Musculoskeletal: No acute osseous abnormalities are identified. The visualized musculature is unremarkable in appearance. IMPRESSION: 1. No acute abnormality seen within the abdomen or pelvis. 2. 3 mm nonobstructing stone at the interpole region of the right kidney. Electronically Signed   By: Garald Balding M.D.   On: 02/10/2017 06:27    Procedures Procedures (including critical care time)  Medications Ordered in ED Medications  ondansetron (ZOFRAN) injection 4 mg (4 mg Intravenous Given 02/10/17 0418)  morphine 4 MG/ML injection 4 mg (4 mg Intravenous Given 02/10/17 0527)  ondansetron (ZOFRAN) injection 4 mg (4 mg Intravenous Given 02/10/17 0526)  pantoprazole (PROTONIX) injection 40 mg (40 mg Intravenous Given 02/10/17 0529)  famotidine (PEPCID) IVPB 20 mg premix (0 mg Intravenous Stopped 02/10/17 0602)  iopamidol (ISOVUE-300) 61 % injection (100 mLs  Contrast Given 02/10/17 0602)     Initial Impression / Assessment and Plan / ED Course  I have reviewed the triage vital signs and the nursing notes.  Pertinent labs & imaging results that were available during my care of the patient were reviewed by me and considered in my medical decision making (see chart for details).  Clinical Course as of Feb 10 709  Wed Feb 10, 2017  0619 Patient has returned from [place]. Endorses improvement in her pain.  [SJ]    Clinical Course User Index [SJ] Kelvyn Schunk C, PA-C    Patient presents with epigastric pain accompanied by nausea and vomiting. Patient is nontoxic appearing, afebrile, not tachypneic, not hypotensive, and maintains SPO2 of 99-100% on room air. Suspect gastric origin, possibly PUD or precursor.  Improvement during ED course. No vomiting during ED course. Able to pass an oral fluid challenge without difficulty. GI follow up. Resources given. The patient was given instructions for home care as well as return precautions. Patient voices understanding of these instructions, accepts the plan, and is comfortable with discharge.    Vitals:   02/10/17 0406 02/10/17 0430 02/10/17 0532  BP: 125/84 103/75 114/83  Pulse: (!) 115 96 (!) 102  Resp:  14 17  Temp: 98.1 F (36.7 C)    TempSrc: Oral    SpO2: 99% 99% 100%     Final Clinical Impressions(s) / ED Diagnoses   Final diagnoses:  Epigastric  pain  Non-intractable vomiting with nausea, unspecified vomiting type    New Prescriptions New Prescriptions   FAMOTIDINE (PEPCID) 20 MG TABLET    Take 1 tablet (20 mg total) by mouth 2 (two) times daily.   ONDANSETRON (ZOFRAN ODT) 4 MG DISINTEGRATING TABLET    Take 1 tablet (4 mg total) by mouth every 8 (eight) hours as needed for nausea or vomiting.   PANTOPRAZOLE (PROTONIX) 20 MG TABLET    Take 1 tablet (20 mg total) by mouth daily.     Lorayne Bender, PA-C 02/10/17 1194    Merryl Hacker, MD 02/13/17 0120

## 2017-02-10 NOTE — ED Notes (Signed)
Patient is on her menses

## 2017-02-12 ENCOUNTER — Encounter: Payer: Self-pay | Admitting: Gastroenterology

## 2017-02-18 ENCOUNTER — Ambulatory Visit (INDEPENDENT_AMBULATORY_CARE_PROVIDER_SITE_OTHER): Payer: Managed Care, Other (non HMO) | Admitting: Gastroenterology

## 2017-02-18 ENCOUNTER — Other Ambulatory Visit (INDEPENDENT_AMBULATORY_CARE_PROVIDER_SITE_OTHER): Payer: Managed Care, Other (non HMO)

## 2017-02-18 ENCOUNTER — Encounter: Payer: Self-pay | Admitting: Gastroenterology

## 2017-02-18 VITALS — BP 110/66 | HR 72 | Ht 67.5 in | Wt 199.0 lb

## 2017-02-18 DIAGNOSIS — K219 Gastro-esophageal reflux disease without esophagitis: Secondary | ICD-10-CM

## 2017-02-18 DIAGNOSIS — R1013 Epigastric pain: Secondary | ICD-10-CM | POA: Diagnosis not present

## 2017-02-18 DIAGNOSIS — R110 Nausea: Secondary | ICD-10-CM | POA: Diagnosis not present

## 2017-02-18 HISTORY — DX: Nausea: R11.0

## 2017-02-18 HISTORY — DX: Epigastric pain: R10.13

## 2017-02-18 LAB — H. PYLORI ANTIBODY, IGG: H Pylori IgG: NEGATIVE

## 2017-02-18 MED ORDER — FAMOTIDINE 20 MG PO TABS: 20.0000 mg | ORAL_TABLET | Freq: Every day | ORAL | 2 refills | Status: DC

## 2017-02-18 MED ORDER — PANTOPRAZOLE SODIUM 40 MG PO TBEC: 40.0000 mg | DELAYED_RELEASE_TABLET | Freq: Every day | ORAL | 2 refills | Status: DC

## 2017-02-18 NOTE — Progress Notes (Addendum)
02/18/2017 Brandy [name] [id number] [birth year]   HISTORY OF PRESENT ILLNESS:  This is a 31 year old female who is new to our office. She was referred to our office by the emergency department, Dr. Dina Rich, for evaluation of epigastric/mid abdominal pain and nausea and vomiting. The patient tells me that the symptoms began about 4 weeks ago. She's never experienced any similar issues in the past. Initially the pain seemed more up under her left rib cage/left breast so when she went to the emergency department they did some cardiac evaluation which was unremarkable. They told her that she had gastritis and gave her Carafate. Then on June 23 she says that she woke up at 3 AM with stabbing pain in her epigastrium/mid abdomen with nausea and vomiting. She went back to the emergency department where they performed a CT scan of the abdomen and pelvis with contrast that was unremarkable. CBC, lipase, and CMP were unremarkable except for a mildly low potassium. She was placed on Protonix 20 mg daily, Pepcid 20 mg twice a day, and Zofran as needed and was told to follow-up in our office. She does not have a gallbladder. She says that her boyfriend was recently diagnosed with H. pylori. Complains of just feeling bloated constantly since this all began.  Had one isolated episode of constipation, which has now resolved and stools seem to be back to normal.  Denies NSAID use.    Past Medical History:  Diagnosis Date  . Anxiety   . Depression   . Gallstones   . History of nephrolithiasis   . Kidney stone   . Migraine    Past Surgical History:  Procedure Laterality Date  . CHOLECYSTECTOMY  2014  . [place], 2004  . TYMPANOSTOMY TUBE PLACEMENT      reports that she has never smoked. She has never used smokeless tobacco. She reports that she drinks alcohol. She reports that she does not use drugs. family history includes Arthritis in her mother; Asthma in her mother; Breast  cancer in her maternal aunt; Colon cancer in her maternal grandmother; Colon polyps in her mother; Diabetes in her father, maternal uncle, maternal uncle, mother, paternal aunt, and paternal grandmother; Heart disease in her mother; Hypertension in her father and mother; Lung cancer in her maternal aunt; Mental illness in her mother; Stomach cancer in her mother. Allergies  Allergen Reactions  . Penicillins Hives and Other (See Comments)    Childhood reaction Has patient had a PCN reaction causing immediate rash, facial/tongue/throat swelling, SOB or lightheadedness with hypotension: No Has patient had a PCN reaction causing severe rash involving mucus membranes or skin necrosis: No Has patient had a PCN reaction that required hospitalization No Has patient had a PCN reaction occurring within the last 10 years: No If all of the above answers are "NO", then may proceed with Cephalosporin use.       Outpatient Encounter Prescriptions as of 02/18/2017  Medication Sig  . famotidine (PEPCID) 20 MG tablet Take 1 tablet (20 mg total) by mouth 2 (two) times daily.  Marland Kitchen FLUoxetine (PROZAC) 20 MG tablet Take 1 tablet (20 mg total) by mouth daily.  . ondansetron (ZOFRAN ODT) 4 MG disintegrating tablet Take 1 tablet (4 mg total) by mouth every 8 (eight) hours as needed for nausea or vomiting.  . pantoprazole (PROTONIX) 20 MG tablet Take 1 tablet (20 mg total) by mouth daily.  . Vitamin D, Ergocalciferol, (DRISDOL) 50000 units CAPS capsule Take 1  capsule (50,000 Units total) by mouth every 7 (seven) days. (Patient taking differently: Take 50,000 Units by mouth every Friday. )  . famotidine (PEPCID) 20 MG tablet Take 1 tablet (20 mg total) by mouth daily. Take daily every morning  . pantoprazole (PROTONIX) 40 MG tablet Take 1 tablet (40 mg total) by mouth daily. Take daily with dinner  . [DISCONTINUED] metoCLOPramide (REGLAN) 10 MG tablet Take 1 tablet (10 mg total) by mouth every 6 (six) hours.  .  [DISCONTINUED] sucralfate (CARAFATE) 1 g tablet Take 1 tablet (1 g total) by mouth 4 (four) times daily -  with meals and at bedtime.   No facility-administered encounter medications on file as of 02/18/2017.      REVIEW OF SYSTEMS  : All other systems reviewed and negative except where noted in the History of Present Illness.   PHYSICAL EXAM: BP 110/66 (BP Location: Left Arm, Patient Position: Sitting, Cuff Size: Normal)   Pulse 72   Ht 5' 7.5" (1.715 m) Comment: height measured without shoes  Wt 199 lb (90.3 kg)   LMP 02/09/2017 (Exact Date)   BMI 30.71 kg/m  General: Well developed white female in no acute distress Head: Normocephalic and atraumatic Eyes:  Sclerae anicteric, conjunctiva pink. Ears: Normal auditory acuity Lungs: Clear throughout to auscultation; no increased WOB. Heart: Regular rate and rhythm Abdomen: Soft, non-distended. Normal bowel sounds.  Epigastric/mid-abdominal TTP. Musculoskeletal: Symmetrical with no gross deformities  Skin: No lesions on visible extremities Extremities: No edema  Neurological: Alert oriented x 4, grossly non-focal Psychological:  Alert and cooperative. Normal mood and affect  ASSESSMENT AND PLAN: -31 year old female with complaints of epigastric/midabdominal pain with associated nausea and vomiting that began about 4 weeks ago. CT scan of the abdomen and pelvis was negative.  CBC, CMP, lipase relatively unremarkable. She is concerned about H. pylori as her boyfriend was recently diagnosed with this. Can check H. pylori serology. She does have intermittent reflux symptoms and I wonder if this may be esophagitis or reflux related. I'm going to increase her Protonix to 40 mg daily in the evening and have her continue famotidine 20 mg in the morning. Can continue Zofran as needed. If H. pylori is positive will treat, if negative we'll have her continue this medication regimen along with a reflux diet for a couple of weeks and she will call our  office with an update on her symptoms.  CC:  Martinique, Betty G, MD  H [pylori serology was negative.  Agree with Brandy [name] management.  Gatha Mayer, MD, [facility]

## 2017-02-18 NOTE — Patient Instructions (Signed)
Your physician has requested that you go to the basement for the following lab work before leaving today:  H pylori  We have sent the following medications to your pharmacy for you to pick up at your convenience:  Pantoprazole 40mg , daily with dinner  Pepcid, 20mg , daily every morning  If you are age 30 or older, your body mass index should be between 23-30. Your Body mass index is 30.71 kg/m. If this is out of the aforementioned range listed, please consider follow up with your Primary Care Provider.  If you are age 5 or younger, your body mass index should be between 19-25. Your Body mass index is 30.71 kg/m. If this is out of the aformentioned range listed, please consider follow up with your Primary Care Provider.

## 2017-04-30 ENCOUNTER — Ambulatory Visit: Payer: Managed Care, Other (non HMO) | Admitting: Family Medicine

## 2017-05-04 ENCOUNTER — Encounter: Payer: Self-pay | Admitting: Family Medicine

## 2017-05-04 ENCOUNTER — Ambulatory Visit (INDEPENDENT_AMBULATORY_CARE_PROVIDER_SITE_OTHER): Payer: Self-pay | Admitting: Family Medicine

## 2017-05-04 VITALS — BP 110/70 | HR 85 | Resp 12 | Ht 67.5 in | Wt 204.2 lb

## 2017-05-04 DIAGNOSIS — E6609 Other obesity due to excess calories: Secondary | ICD-10-CM

## 2017-05-04 DIAGNOSIS — Z6831 Body mass index (BMI) 31.0-31.9, adult: Secondary | ICD-10-CM

## 2017-05-04 DIAGNOSIS — F411 Generalized anxiety disorder: Secondary | ICD-10-CM

## 2017-05-04 DIAGNOSIS — R112 Nausea with vomiting, unspecified: Secondary | ICD-10-CM

## 2017-05-04 DIAGNOSIS — R109 Unspecified abdominal pain: Secondary | ICD-10-CM

## 2017-05-04 DIAGNOSIS — R197 Diarrhea, unspecified: Secondary | ICD-10-CM

## 2017-05-04 DIAGNOSIS — E559 Vitamin D deficiency, unspecified: Secondary | ICD-10-CM

## 2017-05-04 DIAGNOSIS — E876 Hypokalemia: Secondary | ICD-10-CM

## 2017-05-04 DIAGNOSIS — E669 Obesity, unspecified: Secondary | ICD-10-CM | POA: Insufficient documentation

## 2017-05-04 LAB — BASIC METABOLIC PANEL
BUN: 10 mg/dL (ref 6–23)
CALCIUM: 9.5 mg/dL (ref 8.4–10.5)
CO2: 25 mEq/L (ref 19–32)
Chloride: 104 mEq/L (ref 96–112)
Creatinine, Ser: 0.83 mg/dL (ref 0.40–1.20)
GFR: 85.69 mL/min (ref >60.00)
GLUCOSE: 88 mg/dL (ref 70–99)
POTASSIUM: 4.6 meq/L (ref 3.5–5.1)
SODIUM: 139 meq/L (ref 135–145)

## 2017-05-04 LAB — POCT URINE PREGNANCY: Preg Test, Ur: NEGATIVE

## 2017-05-04 LAB — VITAMIN D 25 HYDROXY (VIT D DEFICIENCY, FRACTURES): VITD: 23.49 ng/mL — AB (ref 30.00–100.00)

## 2017-05-04 LAB — TSH: TSH: 2.26 u[IU]/mL (ref 0.35–4.50)

## 2017-05-04 NOTE — Progress Notes (Signed)
ACUTE VISIT   HPI:  Chief Complaint  Patient presents with  . Abdominal Pain    [name] is a 31 y.o. female, who is here today complaining of abdominal pain.  She has been in the ER twice (01/23/17 and 02/10/17) and already evaluated by GI about 2 months ago (02/18/17). Protonix 40 mg daily was recommended. Epigastric abdominal pain, "so bad", constant,associated with nausea and vomiting. Alleviated by food intake. Exacerbated by skipping meals.  Periumbilical cramp like pain for about 2 weeks,intermittent,aggraavted by food intake and alleviated by defecation. "Intense", no radiated. + Intermittent diarrhea, the latter one since cholecystectomy but seems worse. 3-4 stools per day. Bloating sensation. Occasional blood on tissue, attributed to local irritation.  S/P cholecystectomy. Hx of nephrolithiasis. Denies dysuria,increased urinary frequency, gross hematuria,or decreased urine output.  Abdominal Pain  This is a recurrent problem. The current episode started more than 1 month ago. The onset quality is gradual. The problem occurs intermittently. The problem has been unchanged. The pain is located in the epigastric region and periumbilical region. The pain is at a severity of 8/10. The pain is severe. The abdominal pain does not radiate. Associated symptoms include diarrhea, flatus, nausea and vomiting. Pertinent negatives include no dysuria, fever, frequency, headaches, hematuria, melena, myalgias or weight loss. Exacerbated by: periumbilical cramps aggravated by food intake. The pain is relieved by eating. She has tried proton pump inhibitors for the symptoms. The treatment provided mild relief. Prior diagnostic workup includes GI consult.   K+ 3.2 on 02/10/17. Abd/pelvic CT 02/10/17: 1. No acute abnormality seen within the abdomen or pelvis. 2. 3 mm nonobstructing stone at the interpole region of the right kidney.    Vit D deficiency: She is on  Ergocalciferol 50,000 weekly. 25 OH vit D was 14.56 in 10/2016.  She is also concerned about wt gain. She is not exercising regularly, she has not been consistent with a healthy diet.  She is trying to get pregnancy and mentions that she has not ovulation this month. LMP 3 weeks ago.  Hx of depression and anxiety, she has tried medications in the past but has not been consistent.  She denies suicidal thoughts.   Review of Systems  Constitutional: Positive for fatigue. Negative for appetite change, chills, diaphoresis, fever and weight loss.  HENT: Negative for congestion, mouth sores, nosebleeds, sore throat, trouble swallowing and voice change.   Respiratory: Negative for cough, chest tightness, shortness of breath and wheezing.   Cardiovascular: Negative for chest pain and palpitations.  Gastrointestinal: Positive for abdominal pain, diarrhea, flatus, nausea and vomiting. Negative for blood in stool and melena.  Endocrine: Negative for cold intolerance, heat intolerance, polydipsia, polyphagia and polyuria.  Genitourinary: Negative for decreased urine volume, dysuria, frequency, hematuria, vaginal bleeding and vaginal discharge.  Musculoskeletal: Negative for gait problem and myalgias.  Skin: Negative for pallor and rash.  Neurological: Negative for syncope, weakness and headaches.  Hematological: Negative for adenopathy. Does not bruise/bleed easily.  Psychiatric/Behavioral: Positive for sleep disturbance. Negative for confusion. The patient is nervous/anxious.       Current Outpatient Prescriptions on File Prior to Visit  Medication Sig Dispense Refill  . famotidine (PEPCID) 20 MG tablet Take 1 tablet (20 mg total) by mouth daily. Take daily every morning 30 tablet 2  . pantoprazole (PROTONIX) 40 MG tablet Take 1 tablet (40 mg total) by mouth daily. Take daily with dinner 30 tablet 2  . Vitamin D, Ergocalciferol, (DRISDOL) 50000 units CAPS capsule  Take 1 capsule (50,000 Units  total) by mouth every 7 (seven) days. (Patient taking differently: Take 50,000 Units by mouth every Friday. ) 12 capsule 0   No current facility-administered medications on file prior to visit.      Past Medical History:  Diagnosis Date  . Anxiety   . Depression   . Gallstones   . History of nephrolithiasis   . Kidney stone   . Migraine    Allergies  Allergen Reactions  . Penicillins Hives and Other (See Comments)    Childhood reaction Has patient had a PCN reaction causing immediate rash, facial/tongue/throat swelling, SOB or lightheadedness with hypotension: No Has patient had a PCN reaction causing severe rash involving mucus membranes or skin necrosis: No Has patient had a PCN reaction that required hospitalization No Has patient had a PCN reaction occurring within the last 10 years: No If all of the above answers are "NO", then may proceed with Cephalosporin use.     Social History   Social History  . Marital status: Married    Spouse name: N/A  . Number of children: 0  . Years of education: N/A   Occupational History  . [employer]    Social History Main Topics  . Smoking status: Never Smoker  . Smokeless tobacco: Never Used  . Alcohol use Yes     Comment: social-1 every couple of months  . Drug use: No  . Sexual activity: Yes    Birth control/ protection: Condom   Other Topics Concern  . None   Social History Narrative  . None    Vitals:   05/04/17 0928  BP: 110/70  Pulse: 85  Resp: 12  SpO2: 98%   Body mass index is 31.52 kg/m.   Physical Exam  Nursing note and vitals reviewed. Constitutional: She is oriented to person, place, and time. She appears well-developed. She does not appear ill. No distress.  HENT:  Head: Normocephalic and atraumatic.  Mouth/Throat: Oropharynx is clear and moist and mucous membranes are normal.  Eyes: Pupils are equal, round, and reactive to light. Conjunctivae and EOM are normal. No scleral icterus.    Neck: No tracheal deviation present. No thyromegaly (palpable) present.  Cardiovascular: Normal rate and regular rhythm.   No murmur heard. Pulses:      Dorsalis pedis pulses are 2+ on the right side, and 2+ on the left side.  Respiratory: Effort normal and breath sounds normal. No respiratory distress.  GI: Soft. Bowel sounds are normal. She exhibits no distension and no mass. There is no hepatomegaly. There is tenderness in the epigastric area. There is no rigidity, no rebound, no guarding and no CVA tenderness.  Musculoskeletal: She exhibits no edema or tenderness.  Lymphadenopathy:    She has no cervical adenopathy.  Neurological: She is alert and oriented to person, place, and time. She has normal strength. Coordination and gait normal.  Skin: Skin is warm. No rash noted. No erythema.  Psychiatric: Her mood appears anxious. Her affect is labile.  Well groomed, good eye contact.     ASSESSMENT AND PLAN:  Ms Brandy [name] was seen today for abdominal pain.  Diagnoses and all orders for this visit:  Lab Results  Component Value Date   TSH 2.26 05/04/2017   Lab Results  Component Value Date   CREATININE 0.83 05/04/2017   BUN 10 05/04/2017   NA 139 05/04/2017   K 4.6 05/04/2017   CL 104 05/04/2017   CO2 25 05/04/2017  Abdominal pain, unspecified abdominal location  We discussed possible etiologies for epigastric and periumbilical pain: Dyspepsia and IBS among some. Recommend arranging appt with GI for follow up.  -     POCT urine pregnancy: Negative  Nausea vomiting and diarrhea  ? IBS, ? Anxiety. ? Post cholecystectomy synd.       Small and frequent portions of bland food.      Explained that some foods can increase gas production, so try to avoid those.  Instructed about warning signs. Follow with GI.  -     TSH  Vitamin D deficiency  No changes in current management, will follow labs done today and will give further recommendations accordingly. F/U in 4-6  months.  -     VITAMIN D 25 Hydroxy (Vit-D Deficiency, Fractures)  Hypokalemia  Further recommendations will be given according to K+ results.  -     Basic metabolic panel  Class 1 obesity due to excess calories without serious comorbidity with body mass index (BMI) of 31.0 to 31.9 in adult  We discussed benefits of wt loss as well as adverse effects of obesity. Consistency with healthy diet and physical activity recommended. Daily brisk walking for 15-30 min as tolerated.  Generalized anxiety disorder  Some of her symptoms could be aggravated by anxiety and depression. She is not interested in SSRI. Psychotherapy may help.   I recommend following with gyn and avoid pregnancy until her medical problems are stable. Strongly recommend re-considering treatment for depression dn anxiety. Low dose Sertraline may even help with some GI symptoms.    -[name] was advised to seek immediate medical attention if sudden worsening symptoms or to follow if they persist or if new concerns arise.       Brandy [name] G. Martinique, MD  [facility]. [place] office.

## 2017-05-04 NOTE — Patient Instructions (Signed)
A few things to remember from today's visit:   Nausea vomiting and diarrhea - Plan: TSH  Vitamin D deficiency - Plan: VITAMIN D 25 Hydroxy (Vit-D Deficiency, Fractures)  Abdominal pain, unspecified abdominal location - Plan: POCT urine pregnancy  Hypokalemia - Plan: Basic metabolic panel  Please schedule follow up with gastro. Daily Folic acid 1 mg .    Abdominal Bloating When you have abdominal bloating, your abdomen may feel full, tight, or painful. It may also look bigger than normal or swollen (distended). Common causes of abdominal bloating include:  Swallowing air.  Constipation.  Problems digesting food.  Eating too much.  Irritable bowel syndrome. This is a condition that affects the large intestine.  Lactose intolerance. This is an inability to digest lactose, a natural sugar in dairy products.  Celiac disease. This is a condition that affects the ability to digest gluten, a protein found in some grains.  Gastroparesis. This is a condition that slows down the movement of food in the stomach and small intestine. It is more common in people with diabetes mellitus.  Gastroesophageal reflux disease (GERD). This is a digestive condition that makes stomach acid flow back into the esophagus.  Urinary retention. This means that the body is holding onto urine, and the bladder cannot be emptied all the way.  Follow these instructions at home: Eating and drinking  Avoid eating too much.  Try not to swallow air while talking or eating.  Avoid eating while lying down.  Avoid these foods and drinks: ? Foods that cause gas, such as broccoli, cabbage, cauliflower, and baked beans. ? Carbonated drinks. ? Hard candy. ? Chewing gum. Medicines  Take over-the-counter and prescription medicines only as told by your health care provider.  Take probiotic medicines. These medicines contain live bacteria or yeasts that can help digestion.  Take coated peppermint oil  capsules. Activity  Try to exercise regularly. Exercise may help to relieve bloating that is caused by gas and relieve constipation. General instructions  Keep all follow-up visits as told by your health care provider. This is important. Contact a health care provider if:  You have nausea and vomiting.  You have diarrhea.  You have abdominal pain.  You have unusual weight loss or weight gain.  You have severe pain, and medicines do not help. Get help right away if:  You have severe chest pain.  You have trouble breathing.  You have shortness of breath.  You have trouble urinating.  You have darker urine than normal.  You have blood in your stools or have dark, tarry stools. Summary  Abdominal bloating means that the abdomen is swollen.  Common causes of abdominal bloating are swallowing air, constipation, and problems digesting food.  Avoid eating too much and avoid swallowing air.  Avoid foods that cause gas, carbonated drinks, hard candy, and chewing gum. This information is not intended to replace advice given to you by your health care provider. Make sure you discuss any questions you have with your health care provider. Document Released: 09/18/2016 Document Revised: 09/18/2016 Document Reviewed: 09/18/2016 Elsevier Interactive Patient Education  [employer].   Please be sure medication list is accurate. If a new problem present, please set up appointment sooner than planned today.

## 2017-05-06 ENCOUNTER — Telehealth: Payer: Self-pay | Admitting: Family Medicine

## 2017-05-06 NOTE — Telephone Encounter (Signed)
Pt would like results of labs. Please call back. °

## 2017-05-07 ENCOUNTER — Encounter: Payer: Self-pay | Admitting: Family Medicine

## 2017-05-07 NOTE — Telephone Encounter (Signed)
Tried to contact patient, unable to leave voicemail.  

## 2017-05-07 NOTE — Telephone Encounter (Signed)
Results seen by patient.

## 2017-05-07 NOTE — Telephone Encounter (Signed)
Labs were sent through My Chart.

## 2017-05-13 ENCOUNTER — Ambulatory Visit: Payer: Managed Care, Other (non HMO) | Admitting: Gastroenterology

## 2017-05-13 ENCOUNTER — Telehealth: Payer: Self-pay | Admitting: Gastroenterology

## 2017-05-14 ENCOUNTER — Other Ambulatory Visit: Payer: Self-pay | Admitting: Family Medicine

## 2017-05-14 ENCOUNTER — Other Ambulatory Visit: Payer: Self-pay

## 2017-05-14 DIAGNOSIS — K219 Gastro-esophageal reflux disease without esophagitis: Secondary | ICD-10-CM

## 2017-05-14 DIAGNOSIS — E559 Vitamin D deficiency, unspecified: Secondary | ICD-10-CM

## 2017-05-14 MED ORDER — VITAMIN D (ERGOCALCIFEROL) 1.25 MG (50000 UNIT) PO CAPS: ORAL_CAPSULE | ORAL | 1 refills | Status: DC

## 2017-05-14 MED ORDER — PANTOPRAZOLE SODIUM 40 MG PO TBEC: 40.0000 mg | DELAYED_RELEASE_TABLET | Freq: Every day | ORAL | 1 refills | Status: DC

## 2017-05-17 ENCOUNTER — Encounter (HOSPITAL_BASED_OUTPATIENT_CLINIC_OR_DEPARTMENT_OTHER): Payer: Self-pay | Admitting: Emergency Medicine

## 2017-05-17 ENCOUNTER — Emergency Department (HOSPITAL_BASED_OUTPATIENT_CLINIC_OR_DEPARTMENT_OTHER): Payer: PRIVATE HEALTH INSURANCE

## 2017-05-17 ENCOUNTER — Emergency Department (HOSPITAL_BASED_OUTPATIENT_CLINIC_OR_DEPARTMENT_OTHER)
Admission: EM | Admit: 2017-05-17 | Discharge: 2017-05-17 | Disposition: A | Discharge status: Home / Self Care | Payer: PRIVATE HEALTH INSURANCE | Attending: Emergency Medicine | Admitting: Emergency Medicine

## 2017-05-17 DIAGNOSIS — Z79899 Other long term (current) drug therapy: Secondary | ICD-10-CM | POA: Insufficient documentation

## 2017-05-17 DIAGNOSIS — R1031 Right lower quadrant pain: Secondary | ICD-10-CM | POA: Diagnosis present

## 2017-05-17 DIAGNOSIS — N200 Calculus of kidney: Secondary | ICD-10-CM | POA: Insufficient documentation

## 2017-05-17 DIAGNOSIS — R109 Unspecified abdominal pain: Secondary | ICD-10-CM

## 2017-05-17 LAB — URINALYSIS, ROUTINE W REFLEX MICROSCOPIC
Bilirubin Urine: NEGATIVE
Glucose, UA: NEGATIVE mg/dL
KETONES UR: NEGATIVE mg/dL
LEUKOCYTES UA: NEGATIVE
NITRITE: NEGATIVE
PH: 6 (ref 5.0–8.0)
PROTEIN: NEGATIVE mg/dL
Specific Gravity, Urine: 1.025 (ref 1.005–1.030)

## 2017-05-17 LAB — CBC WITH DIFFERENTIAL/PLATELET
BASOS PCT: 0 %
Basophils Absolute: 0 10*3/uL (ref 0.0–0.1)
EOS ABS: 0.1 10*3/uL (ref 0.0–0.7)
Eosinophils Relative: 1 %
HCT: 38 % (ref 36.0–46.0)
Hemoglobin: 12.8 g/dL (ref 12.0–15.0)
LYMPHS ABS: 2 10*3/uL (ref 0.7–4.0)
Lymphocytes Relative: 21 %
MCH: 30.5 pg (ref 26.0–34.0)
MCHC: 33.7 g/dL (ref 30.0–36.0)
MCV: 90.7 fL (ref 78.0–100.0)
Monocytes Absolute: 0.7 10*3/uL (ref 0.1–1.0)
Monocytes Relative: 8 %
Neutro Abs: 6.8 10*3/uL (ref 1.7–7.7)
Neutrophils Relative %: 70 %
Platelets: 327 10*3/uL (ref 150–400)
RBC: 4.19 MIL/uL (ref 3.87–5.11)
RDW: 12.5 % (ref 11.5–15.5)
WBC: 9.6 10*3/uL (ref 4.0–10.5)

## 2017-05-17 LAB — BASIC METABOLIC PANEL
Anion gap: 6 (ref 5–15)
BUN: 13 mg/dL (ref 6–20)
CALCIUM: 9 mg/dL (ref 8.9–10.3)
CHLORIDE: 107 mmol/L (ref 101–111)
CO2: 27 mmol/L (ref 22–32)
CREATININE: 0.89 mg/dL (ref 0.44–1.00)
GFR calc Af Amer: >60 mL/min (ref >60)
GFR calc non Af Amer: >60 mL/min (ref >60)
Glucose, Bld: 106 mg/dL — ABNORMAL HIGH (ref 65–99)
Potassium: 3.8 mmol/L (ref 3.5–5.1)
SODIUM: 140 mmol/L (ref 135–145)

## 2017-05-17 LAB — PREGNANCY, URINE: PREG TEST UR: NEGATIVE

## 2017-05-17 LAB — URINALYSIS, MICROSCOPIC (REFLEX)

## 2017-05-17 MED ORDER — TAMSULOSIN HCL 0.4 MG PO CAPS: 0.4000 mg | ORAL_CAPSULE | Freq: Two times a day (BID) | ORAL | 0 refills | Status: DC

## 2017-05-17 MED ORDER — NAPROXEN 375 MG PO TABS: 375.0000 mg | ORAL_TABLET | Freq: Two times a day (BID) | ORAL | 0 refills | Status: DC

## 2017-05-17 MED ORDER — NAPROXEN 375 MG PO TABS
375.0000 mg | ORAL_TABLET | Freq: Two times a day (BID) | ORAL | 0 refills | Status: DC
Start: 2017-05-17 — End: 2017-06-21

## 2017-05-17 MED ORDER — MORPHINE SULFATE (PF) 4 MG/ML IV SOLN
4.0000 mg | Freq: Once | INTRAVENOUS | Status: AC
  Administered 2017-05-17: 4 mg via INTRAVENOUS
  Filled 2017-05-17: qty 1

## 2017-05-17 MED ORDER — ONDANSETRON HCL 4 MG/2ML IJ SOLN
4.0000 mg | Freq: Once | INTRAMUSCULAR | Status: AC
  Administered 2017-05-17: 4 mg via INTRAVENOUS
  Filled 2017-05-17: qty 2

## 2017-05-17 MED ORDER — SODIUM CHLORIDE 0.9 % IV BOLUS (SEPSIS)
1000.0000 mL | Freq: Once | INTRAVENOUS | Status: AC
  Administered 2017-05-17: 1000 mL via INTRAVENOUS

## 2017-05-17 MED ORDER — KETOROLAC TROMETHAMINE 30 MG/ML IJ SOLN
30.0000 mg | Freq: Once | INTRAMUSCULAR | Status: AC
  Administered 2017-05-17: 30 mg via INTRAVENOUS
  Filled 2017-05-17: qty 1

## 2017-05-17 NOTE — ED Provider Notes (Signed)
[facility] DEPT [facility] Provider Note   CSN: [id number] Arrival date & time: 05/17/17  1608     History   Chief Complaint Chief Complaint  Patient presents with  . Flank Pain    HPI Brandy [name] is a 31 y.o. female.  Brandy [name] is a 31 y.o. Female with a history of kidney stones presents to the emergency room complaining of right flank pain. Patient reports she believes she has another kidney stone. She reports her pain began yesterday and has gradually worsened. She reports pain beginning in her right flank that radiates into her right groin. She reports her pain is fluctuating in intensity similar to her previous stones. She reports some nausea but no vomiting. No diarrhea. She's taken nothing for treatment of her symptoms today. She is currently on her menstrual cycle. She does report some dysuria, but denies hematuria, urinary frequency or urgency. No decreased urination. Previous abdominal surgical history includes a cholecystectomy. She's previously been seen at [facility] urology. She's had previous stenting and lithotripsy before. She denies vomiting, diarrhea, hematuria, difficulty urinating, chest pain, coughing, lightheadedness or syncope.   The history is provided by the patient, medical records and a friend.  Flank Pain  Associated symptoms include abdominal pain. Pertinent negatives include no chest pain, no headaches and no shortness of breath.    Past Medical History:  Diagnosis Date  . Anxiety   . Depression   . Gallstones   . History of nephrolithiasis   . Kidney stone   . Migraine     Patient Active Problem List   Diagnosis Date Noted  . Class 1 obesity with body mass index (BMI) of 31.0 to 31.9 in adult 05/04/2017  . Nausea without vomiting 02/18/2017  . Abdominal pain, epigastric 02/18/2017  . Vitamin D deficiency 07/21/2016  . B12 deficiency 07/21/2016  . Generalized anxiety disorder 06/09/2016  . Anterior neck pain 12/30/2015    . Enlarged thyroid gland 12/30/2015  . Fatigue 12/30/2015  . Routine general medical examination at a health care facility 09/27/2014  . Tendinitis of right ankle 08/14/2014  . Nausea vomiting and diarrhea 12/28/2013  . Serum calcium elevated 12/28/2013  . 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 DUB (dysfunctional uterine bleeding) 06/04/2011  . GERD (gastroesophageal reflux disease) 02/19/2011  . Depression 09/06/2008  . NEPHROLITHIASIS, HX OF 12/16/2007    Past Surgical History:  Procedure Laterality Date  . CHOLECYSTECTOMY  2014  . [place], 2004  . TYMPANOSTOMY TUBE PLACEMENT      OB History    Gravida Para Term Preterm AB Living   1 0 0 0 1 0   SAB TAB Ectopic Multiple Live Births   1 0 0 0        Obstetric Comments   Age first period 15       Home Medications    Prior to Admission medications   Medication Sig Start Date End Date Taking? Authorizing Provider  famotidine (PEPCID) 20 MG tablet Take 1 tablet (20 mg total) by mouth daily. Take daily every morning 02/18/17   Zehr, Laban Emperor, PA-C  naproxen (NAPROSYN) 375 MG tablet Take 1 tablet (375 mg total) by mouth 2 (two) times daily with a meal. 05/17/17   Waynetta Pean, PA-C  pantoprazole (PROTONIX) 40 MG tablet Take 1 tablet (40 mg total) by mouth daily before breakfast. 05/14/17   Martinique, Betty G, MD  tamsulosin (FLOMAX) 0.4 MG CAPS capsule Take 1 capsule (0.4 mg total) by mouth  2 (two) times daily. 05/17/17   Waynetta Pean, PA-C  Vitamin D, Ergocalciferol, (DRISDOL) 50000 units CAPS capsule Take 1 capsule by mouth once every 5 days. 05/14/17   Martinique, Betty G, MD    Family History Family History  Problem Relation Age of Onset  . Diabetes Mother   . Mental illness Mother   . Asthma Mother   . Stomach cancer Mother   . Colon polyps Mother   . Heart disease Mother   . Hypertension Mother   . Arthritis Mother   . Diabetes Father   . Hypertension Father   . Colon cancer Maternal Grandmother   .  Diabetes Paternal Grandmother   . Breast cancer Maternal Aunt   . Lung cancer Maternal Aunt   . Diabetes Paternal Aunt   . Diabetes Maternal Uncle   . Diabetes Maternal Uncle     Social History Social History  Substance Use Topics  . Smoking status: Never Smoker  . Smokeless tobacco: Never Used  . Alcohol use Yes     Comment: social-1 every couple of months     Allergies   Penicillins   Review of Systems Review of Systems  Constitutional: Negative for chills and fever.  HENT: Negative for congestion and sore throat.   Eyes: Negative for visual disturbance.  Respiratory: Negative for cough and shortness of breath.   Cardiovascular: Negative for chest pain.  Gastrointestinal: Positive for abdominal pain and nausea. Negative for blood in stool, diarrhea and vomiting.  Genitourinary: Positive for dysuria and flank pain. Negative for difficulty urinating, frequency, hematuria, menstrual problem and urgency.  Musculoskeletal: Negative for back pain.  Skin: Negative for rash.  Neurological: Negative for headaches.     Physical Exam Updated Vital Signs BP 109/68 (BP Location: Left Arm)   Pulse 77   Temp 98.5 F (36.9 C) (Oral)   Resp 16   Ht 5\' 8"  (1.727 m)   Wt 92.5 kg (204 lb)   LMP 05/17/2017   SpO2 100%   BMI 31.02 kg/m   Physical Exam  Constitutional: She appears well-developed and well-nourished. No distress.  Nontoxic appearing.  HENT:  Head: Normocephalic and atraumatic.  Mouth/Throat: Oropharynx is clear and moist.  Eyes: Pupils are equal, round, and reactive to light. Conjunctivae are normal. Right eye exhibits no discharge. Left eye exhibits no discharge.  Neck: Neck supple.  Cardiovascular: Normal rate, regular rhythm, normal heart sounds and intact distal pulses.  Exam reveals no gallop and no friction rub.   No murmur heard. Pulmonary/Chest: Effort normal and breath sounds normal. No respiratory distress. She has no wheezes. She has no rales.    Abdominal: Soft. Bowel sounds are normal. She exhibits no distension and no mass. There is tenderness. There is no rebound and no guarding.  Right flank pain to palpation. Abdomen is soft and nontender to palpation. No RLQ tenderness or adnexal tenderness.  No peritoneal signs.  Musculoskeletal: She exhibits no edema.  Lymphadenopathy:    She has no cervical adenopathy.  Neurological: She is alert. Coordination normal.  Skin: Skin is warm and dry. No rash noted. She is not diaphoretic. No erythema. No pallor.  Psychiatric: She has a normal mood and affect. Her behavior is normal.  Nursing note and vitals reviewed.    ED Treatments / Results  Labs (all labs ordered are listed, but only abnormal results are displayed) Labs Reviewed  URINALYSIS, ROUTINE W REFLEX MICROSCOPIC - Abnormal; Notable for the following:       Result  Value   Hgb urine dipstick MODERATE (*)    All other components within normal limits  BASIC METABOLIC PANEL - Abnormal; Notable for the following:    Glucose, Bld 106 (*)    All other components within normal limits  URINALYSIS, MICROSCOPIC (REFLEX) - Abnormal; Notable for the following:    Bacteria, UA FEW (*)    Squamous Epithelial / LPF 0-5 (*)    All other components within normal limits  PREGNANCY, URINE  CBC WITH DIFFERENTIAL/PLATELET    EKG  EKG Interpretation None       Radiology US Renal  Result Date: 05/17/2017 CLINICAL DATA:  Right flank pain for 2 days. Non right kidney stone. EXAM: RENAL / URINARY TRACT ULTRASOUND COMPLETE COMPARISON:  CT, 02/10/2017 FINDINGS: Right Kidney: Length: 10.8 cm. Normal parenchymal echogenicity. Small shadowing 4-5 mm stone in the midpole. No other stones. No hydronephrosis. No renal masses. Left Kidney: Length: 11.4 cm. Normal parenchymal echogenicity. 5 mm stone in the midpole. No other intrarenal stones. No renal masses. No hydronephrosis. Bladder: Appears normal for degree of bladder distention. IMPRESSION: 1.  No acute findings.  No evidence of obstructive uropathy. 2. Single small stones noted in the mid poles of each kidney. No hydronephrosis. No other abnormalities. Electronically Signed   By: Lajean Manes M.D.   On: 05/17/2017 18:43    Procedures Procedures (including critical care time)  Medications Ordered in ED Medications  sodium chloride 0.9 % bolus 1,000 mL (0 mLs Intravenous Stopped 05/17/17 1842)  ondansetron (ZOFRAN) injection 4 mg (4 mg Intravenous Given 05/17/17 1715)  morphine 4 MG/ML injection 4 mg (4 mg Intravenous Given 05/17/17 1716)  ketorolac (TORADOL) 30 MG/ML injection 30 mg (30 mg Intravenous Given 05/17/17 1842)     Initial Impression / Assessment and Plan / ED Course  I have reviewed the triage vital signs and the nursing notes.  Pertinent labs & imaging results that were available during my care of the patient were reviewed by me and considered in my medical decision making (see chart for details).    This is a 31 y.o. Female with a history of kidney stones presents to the emergency room complaining of right flank pain. Patient reports she believes she has another kidney stone. She reports her pain began yesterday and has gradually worsened. She reports pain beginning in her right flank that radiates into her right groin. She reports her pain is fluctuating in intensity similar to her previous stones. She reports some nausea but no vomiting. No diarrhea. She's taken nothing for treatment of her symptoms today. She is currently on her menstrual cycle. On exam the patient is afebrile nontoxic appearing. She is right flank tenderness to palpation. Her abdomen is soft and nontender. No right lower quadrant tenderness. No right adnexal tenderness. Pregnancy test is negative. BMP is unremarkable. Normal kidney function. CBC is within normal limits. Urinalysis is nitrite and leukocyte negative. Moderate hemoglobin on UA.  Renal ultrasound was obtained due to patient's history of  multiple kidney stones. This shows no acute findings. There is no evidence of obstructive uropathy. There is single small stones are noted in the mid poles of each kidney. No hydronephrosis or other abnormalities noted. At reevaluation patient reports her pain has resolved. She is tolerating by mouth. The patient may have had a small stone that has now passed. UA patient's pain has resolved. I see no need for further workup at this time. We'll provide her with a course of naproxen to use it  if pain returns she reports she has Zofran at home. She does ask for some Flomax that she reports has helped with her kidney stones in the past. I did encourage her to follow-up with her urologist as needed. Return precautions discussed.  I advised the patient to follow-up with their primary care provider this week. I advised the patient to return to the emergency department with new or worsening symptoms or new concerns. The patient verbalized understanding and agreement with plan.      Final Clinical Impressions(s) / ED Diagnoses   Final diagnoses:  Right flank pain  Kidney stone    New Prescriptions Current Discharge Medication List    START taking these medications   Details  naproxen (NAPROSYN) 375 MG tablet Take 1 tablet (375 mg total) by mouth 2 (two) times daily with a meal. Qty: 30 tablet, Refills: 0    tamsulosin (FLOMAX) 0.4 MG CAPS capsule Take 1 capsule (0.4 mg total) by mouth 2 (two) times daily. Qty: 10 capsule, Refills: 0         Waynetta Pean, PA-C 05/17/17 2000    Forde Dandy, MD 05/18/17 [phone]

## 2017-05-17 NOTE — ED Triage Notes (Signed)
Patient states that she is having pain to her right flank area. The patient reports that she has had 28 stones in the past

## 2017-05-20 ENCOUNTER — Encounter: Payer: Self-pay | Admitting: Family Medicine

## 2017-06-21 ENCOUNTER — Inpatient Hospital Stay (HOSPITAL_COMMUNITY): Payer: PRIVATE HEALTH INSURANCE

## 2017-06-21 ENCOUNTER — Inpatient Hospital Stay (HOSPITAL_COMMUNITY)
Admission: AD | Admit: 2017-06-21 | Discharge: 2017-06-21 | Disposition: A | Discharge status: Home / Self Care | Payer: PRIVATE HEALTH INSURANCE | Source: Ambulatory Visit | Attending: Obstetrics and Gynecology | Admitting: Obstetrics and Gynecology

## 2017-06-21 ENCOUNTER — Encounter (HOSPITAL_COMMUNITY): Payer: Self-pay | Admitting: *Deleted

## 2017-06-21 DIAGNOSIS — R12 Heartburn: Secondary | ICD-10-CM | POA: Diagnosis not present

## 2017-06-21 DIAGNOSIS — O219 Vomiting of pregnancy, unspecified: Secondary | ICD-10-CM | POA: Diagnosis not present

## 2017-06-21 DIAGNOSIS — O26891 Other specified pregnancy related conditions, first trimester: Secondary | ICD-10-CM | POA: Insufficient documentation

## 2017-06-21 DIAGNOSIS — R1031 Right lower quadrant pain: Secondary | ICD-10-CM | POA: Diagnosis present

## 2017-06-21 DIAGNOSIS — Z3491 Encounter for supervision of normal pregnancy, unspecified, first trimester: Secondary | ICD-10-CM

## 2017-06-21 DIAGNOSIS — R109 Unspecified abdominal pain: Secondary | ICD-10-CM

## 2017-06-21 DIAGNOSIS — Z3A01 Less than 8 weeks gestation of pregnancy: Secondary | ICD-10-CM | POA: Insufficient documentation

## 2017-06-21 DIAGNOSIS — Z880 Allergy status to penicillin: Secondary | ICD-10-CM | POA: Diagnosis not present

## 2017-06-21 DIAGNOSIS — O210 Mild hyperemesis gravidarum: Secondary | ICD-10-CM | POA: Diagnosis not present

## 2017-06-21 DIAGNOSIS — O99611 Diseases of the digestive system complicating pregnancy, first trimester: Secondary | ICD-10-CM | POA: Diagnosis not present

## 2017-06-21 LAB — HIV ANTIBODY (ROUTINE TESTING W REFLEX): HIV SCREEN 4TH GENERATION: NONREACTIVE

## 2017-06-21 LAB — CBC
HCT: 38.1 % (ref 36.0–46.0)
HEMOGLOBIN: 12.8 g/dL (ref 12.0–15.0)
MCH: 30.1 pg (ref 26.0–34.0)
MCHC: 33.6 g/dL (ref 30.0–36.0)
MCV: 89.6 fL (ref 78.0–100.0)
Platelets: 313 10*3/uL (ref 150–400)
RBC: 4.25 MIL/uL (ref 3.87–5.11)
RDW: 13.2 % (ref 11.5–15.5)
WBC: 8.9 10*3/uL (ref 4.0–10.5)

## 2017-06-21 LAB — WET PREP, GENITAL
Clue Cells Wet Prep HPF POC: NONE SEEN
Sperm: NONE SEEN
Trich, Wet Prep: NONE SEEN
Yeast Wet Prep HPF POC: NONE SEEN

## 2017-06-21 LAB — URINALYSIS, ROUTINE W REFLEX MICROSCOPIC
BILIRUBIN URINE: NEGATIVE
Glucose, UA: NEGATIVE mg/dL
HGB URINE DIPSTICK: NEGATIVE
KETONES UR: NEGATIVE mg/dL
Leukocytes, UA: NEGATIVE
Nitrite: NEGATIVE
PH: 6 (ref 5.0–8.0)
Protein, ur: NEGATIVE mg/dL
Specific Gravity, Urine: 1.017 (ref 1.005–1.030)

## 2017-06-21 LAB — POCT PREGNANCY, URINE: Preg Test, Ur: POSITIVE — AB

## 2017-06-21 LAB — GC/CHLAMYDIA PROBE AMP (~~LOC~~) NOT AT ARMC
CHLAMYDIA, DNA PROBE: NEGATIVE
NEISSERIA GONORRHEA: NEGATIVE

## 2017-06-21 LAB — HCG, QUANTITATIVE, PREGNANCY: HCG, BETA CHAIN, QUANT, S: 7461 m[IU]/mL — AB (ref <5)

## 2017-06-21 NOTE — MAU Note (Signed)
Pt reports she woke up tonight with sharp upper abd pain. Has ben having N/V for about 4 days. Worried because she had and ectopic pregnancy 2 years ago. Denies any vaginal bleeding. Reports some clear discharge.

## 2017-06-21 NOTE — MAU Provider Note (Signed)
Chief Complaint: Abdominal Pain   First Provider Initiated Contact with Patient 06/21/17 [phone]      SUBJECTIVE HPI: Brandy [name] is a 31 y.o. G2P0010 at [redacted]w[redacted]d by LMP who presents to maternity admissions reporting RLQ intermittent pain x 1 week with new onset mid upper abdomen pain tonight that woke her up from sleep. She reports nausea with vomiting daily in this pregnancy.  She has history of miscarriage and since pregnancy was early, was told there was no way to rule out ectopic.  She has started prenatal care and had appropriate rise in hcg in the office, last lab Friday, 10/19, was ~1400 per the pt.  She has not tried any treatments, nothing makes it better or worse. She is anxious about this pregnancy. She denies vaginal bleeding, vaginal itching/burning, urinary symptoms, h/a, dizziness, n/v, or fever/chills.     HPI  Past Medical History:  Diagnosis Date  . Anxiety   . Depression   . Gallstones   . History of nephrolithiasis   . Kidney stone   . Migraine    Past Surgical History:  Procedure Laterality Date  . CHOLECYSTECTOMY  2014  . [place], 2004  . TYMPANOSTOMY TUBE PLACEMENT     Social History   Social History  . Marital status: Married    Spouse name: N/A  . Number of children: 0  . Years of education: N/A   Occupational History  . [employer]    Social History Main Topics  . Smoking status: Never Smoker  . Smokeless tobacco: Never Used  . Alcohol use Yes     Comment: social-1 every couple of months  . Drug use: No  . Sexual activity: Yes    Birth control/ protection: Condom   Other Topics Concern  . Not on file   Social History Narrative  . No narrative on file   No current facility-administered medications on file prior to encounter.    Current Outpatient Prescriptions on File Prior to Encounter  Medication Sig Dispense Refill  . famotidine (PEPCID) 20 MG tablet Take 1 tablet (20 mg total) by mouth daily. Take  daily every morning 30 tablet 2  . naproxen (NAPROSYN) 375 MG tablet Take 1 tablet (375 mg total) by mouth 2 (two) times daily with a meal. 30 tablet 0  . pantoprazole (PROTONIX) 40 MG tablet Take 1 tablet (40 mg total) by mouth daily before breakfast. 30 tablet 1  . tamsulosin (FLOMAX) 0.4 MG CAPS capsule Take 1 capsule (0.4 mg total) by mouth 2 (two) times daily. 10 capsule 0  . Vitamin D, Ergocalciferol, (DRISDOL) 50000 units CAPS capsule Take 1 capsule by mouth once every 5 days. 12 capsule 1   Allergies  Allergen Reactions  . Penicillins Hives and Other (See Comments)    Childhood reaction Has patient had a PCN reaction causing immediate rash, facial/tongue/throat swelling, SOB or lightheadedness with hypotension: No Has patient had a PCN reaction causing severe rash involving mucus membranes or skin necrosis: No Has patient had a PCN reaction that required hospitalization No Has patient had a PCN reaction occurring within the last 10 years: No If all of the above answers are "NO", then may proceed with Cephalosporin use.     ROS:  Review of Systems  Constitutional: Negative for chills, fatigue and fever.  Respiratory: Negative for shortness of breath.   Cardiovascular: Negative for chest pain.  Gastrointestinal: Positive for abdominal pain, nausea and vomiting.  Genitourinary: Positive for  pelvic pain. Negative for difficulty urinating, dysuria, flank pain, vaginal bleeding, vaginal discharge and vaginal pain.  Neurological: Negative for dizziness and headaches.  Psychiatric/Behavioral: Negative.      I have reviewed patient's Past Medical Hx, Surgical Hx, Family Hx, Social Hx, medications and allergies.   Physical Exam   Patient Vitals for the past 24 hrs:  BP Temp Pulse Resp Height Weight  06/21/17 0318 121/65 98.2 F (36.8 C) 93 18 - -  06/21/17 0313 - - - - (P) 5' 8.5" (1.74 m) (P) 206 lb (93.4 kg)   Constitutional: Well-developed, well-nourished female in no acute  distress.  Cardiovascular: normal rate Respiratory: normal effort GI: Abd soft, non-tender. Pos BS x 4 MS: Extremities nontender, no edema, normal ROM Neurologic: Alert and oriented x 4.  GU: Neg CVAT.  PELVIC EXAM: Cervix pink, visually closed, without lesion, scant white creamy discharge, vaginal walls and external genitalia normal Bimanual exam: Cervix 0/long/high, firm, anterior, neg CMT, uterus nontender, nonenlarged, adnexa without tenderness, enlargement, or mass   LAB RESULTS Results for orders placed or performed during the hospital encounter of 06/21/17 (from the past 24 hour(s))  Urinalysis, Routine w reflex microscopic     Status: None   Collection Time: 06/21/17  3:10 AM  Result Value Ref Range   Color, Urine YELLOW YELLOW   APPearance CLEAR CLEAR   Specific Gravity, Urine 1.017 1.005 - 1.030   pH 6.0 5.0 - 8.0   Glucose, UA NEGATIVE NEGATIVE mg/dL   Hgb urine dipstick NEGATIVE NEGATIVE   Bilirubin Urine NEGATIVE NEGATIVE   Ketones, ur NEGATIVE NEGATIVE mg/dL   Protein, ur NEGATIVE NEGATIVE mg/dL   Nitrite NEGATIVE NEGATIVE   Leukocytes, UA NEGATIVE NEGATIVE  CBC     Status: None   Collection Time: 06/21/17  3:39 AM  Result Value Ref Range   WBC 8.9 4.0 - 10.5 K/uL   RBC 4.25 3.87 - 5.11 MIL/uL   Hemoglobin 12.8 12.0 - 15.0 g/dL   HCT 38.1 36.0 - 46.0 %   MCV 89.6 78.0 - 100.0 fL   MCH 30.1 26.0 - 34.0 pg   MCHC 33.6 30.0 - 36.0 g/dL   RDW 13.2 11.5 - 15.5 %   Platelets 313 150 - 400 K/uL  hCG, quantitative, pregnancy     Status: Abnormal   Collection Time: 06/21/17  3:39 AM  Result Value Ref Range   hCG, Beta Chain, Quant, S 7,461 (H) <5 mIU/mL  Wet prep, genital     Status: Abnormal   Collection Time: 06/21/17  3:43 AM  Result Value Ref Range   Yeast Wet Prep HPF POC NONE SEEN NONE SEEN   Trich, Wet Prep NONE SEEN NONE SEEN   Clue Cells Wet Prep HPF POC NONE SEEN NONE SEEN   WBC, Wet Prep HPF POC FEW (A) NONE SEEN   Sperm NONE SEEN   Pregnancy, urine  POC     Status: Abnormal   Collection Time: 06/21/17  4:11 AM  Result Value Ref Range   Preg Test, Ur POSITIVE (A) NEGATIVE       IMAGING US Ob Comp Less 14 Wks  Result Date: 06/21/2017 CLINICAL DATA:  Acute onset of pelvic pain.  Initial encounter. EXAM: OBSTETRIC <14 WK [place] AND TRANSVAGINAL OB US TECHNIQUE: Both transabdominal and transvaginal ultrasound examinations were performed for complete evaluation of the gestation as well as the maternal uterus, adnexal regions, and pelvic cul-de-sac. Transvaginal technique was performed to assess early pregnancy. COMPARISON:  CT of the  abdomen and pelvis performed 02/10/2017, and pelvic ultrasound performed 07/20/2016 FINDINGS: Intrauterine gestational sac: Single; visualized and normal in shape. Yolk sac:  Yes Embryo:  No Cardiac Activity: N/A MSD: 8.1 mm   5 w   4  d Subchorionic hemorrhage:  None visualized. Maternal uterus/adnexae: The uterus is otherwise unremarkable. The ovaries are within normal limits. The right ovary measures 3.6 x 2.6 x 2.6 cm, while the left ovary measures 2.4 x 1.2 x 1.6 cm. No suspicious adnexal masses are seen; there is no evidence for ovarian torsion. No free fluid is seen within the pelvic cul-de-sac. IMPRESSION: Single intrauterine gestational sac noted, with a mean sac diameter of 8 mm, corresponding to a gestational age of [redacted] weeks 4 days. This matches the gestational age by LMP, reflecting an estimated date of delivery of February 17, 2018. Yolk sac is seen; no embryo is yet visualized, within normal limits. Electronically Signed   By: Garald Balding M.D.   On: 06/21/2017 05:01   US Ob Transvaginal  Result Date: 06/21/2017 CLINICAL DATA:  Acute onset of pelvic pain.  Initial encounter. EXAM: OBSTETRIC <14 WK [place] AND TRANSVAGINAL OB US TECHNIQUE: Both transabdominal and transvaginal ultrasound examinations were performed for complete evaluation of the gestation as well as the maternal uterus, adnexal regions, and pelvic  cul-de-sac. Transvaginal technique was performed to assess early pregnancy. COMPARISON:  CT of the abdomen and pelvis performed 02/10/2017, and pelvic ultrasound performed 07/20/2016 FINDINGS: Intrauterine gestational sac: Single; visualized and normal in shape. Yolk sac:  Yes Embryo:  No Cardiac Activity: N/A MSD: 8.1 mm   5 w   4  d Subchorionic hemorrhage:  None visualized. Maternal uterus/adnexae: The uterus is otherwise unremarkable. The ovaries are within normal limits. The right ovary measures 3.6 x 2.6 x 2.6 cm, while the left ovary measures 2.4 x 1.2 x 1.6 cm. No suspicious adnexal masses are seen; there is no evidence for ovarian torsion. No free fluid is seen within the pelvic cul-de-sac. IMPRESSION: Single intrauterine gestational sac noted, with a mean sac diameter of 8 mm, corresponding to a gestational age of [redacted] weeks 4 days. This matches the gestational age by LMP, reflecting an estimated date of delivery of February 17, 2018. Yolk sac is seen; no embryo is yet visualized, within normal limits. Electronically Signed   By: Garald Balding M.D.   On: 06/21/2017 05:01    MAU Management/MDM: Ordered labs and [place] and reviewed results.  IUP confirmed by today's [place] with gestational sac and yolk sac today, c/w [redacted]w[redacted]d by LMP dates.  Quant hcg continued to rise appropriately.  Discussed results with pt today.  Pt has Diclegis at home, recommend resuming this as directed. Add Pepcid 20 mg BID or Zantac 150 mg BID OTC medications PRN for heartburn.  F/U in office as scheduled.  Consult Dr Terri Piedra with assessment and findings.  Pt discharged with strict first trimester/miscarriage precautions.  ASSESSMENT 1. Nausea and vomiting during pregnancy prior to [redacted] weeks gestation   2. Abdominal pain during pregnancy in first trimester   3. Heartburn during pregnancy in first trimester   4. Normal IUP (intrauterine pregnancy) on prenatal ultrasound, first trimester     PLAN Discharge home Allergies as of 06/21/2017       Reactions   Penicillins Hives, Other (See Comments)   Childhood reaction Has patient had a PCN reaction causing immediate rash, facial/tongue/throat swelling, SOB or lightheadedness with hypotension: No Has patient had a PCN reaction causing severe rash involving  mucus membranes or skin necrosis: No Has patient had a PCN reaction that required hospitalization No Has patient had a PCN reaction occurring within the last 10 years: No If all of the above answers are "NO", then may proceed with Cephalosporin use.      Medication List    STOP taking these medications   naproxen 375 MG tablet Commonly known as:  NAPROSYN   pantoprazole 40 MG tablet Commonly known as:  PROTONIX   tamsulosin 0.4 MG Caps capsule Commonly known as:  FLOMAX   Vitamin D (Ergocalciferol) 50000 units Caps capsule Commonly known as:  DRISDOL     TAKE these medications   famotidine 20 MG tablet Commonly known as:  PEPCID Take 1 tablet (20 mg total) by mouth daily. Take daily every morning      Follow-up Information    [facility] Follow up.   Why:  As scheduled, return to MAU as needed for emergencies Contact information: [address] [place] [address] [phone]           Fatima Blank Certified Nurse-Midwife 06/21/2017  5:21 AM

## 2017-07-02 ENCOUNTER — Encounter (HOSPITAL_COMMUNITY): Payer: Self-pay

## 2017-07-02 ENCOUNTER — Inpatient Hospital Stay (HOSPITAL_COMMUNITY)
Admission: AD | Admit: 2017-07-02 | Discharge: 2017-07-02 | Disposition: A | Discharge status: Home / Self Care | Payer: 59 | Source: Ambulatory Visit | Attending: Obstetrics & Gynecology | Admitting: Obstetrics & Gynecology

## 2017-07-02 DIAGNOSIS — Z3A01 Less than 8 weeks gestation of pregnancy: Secondary | ICD-10-CM | POA: Diagnosis not present

## 2017-07-02 DIAGNOSIS — O218 Other vomiting complicating pregnancy: Secondary | ICD-10-CM | POA: Diagnosis not present

## 2017-07-02 DIAGNOSIS — O99341 Other mental disorders complicating pregnancy, first trimester: Secondary | ICD-10-CM | POA: Diagnosis not present

## 2017-07-02 DIAGNOSIS — F419 Anxiety disorder, unspecified: Secondary | ICD-10-CM | POA: Insufficient documentation

## 2017-07-02 DIAGNOSIS — R112 Nausea with vomiting, unspecified: Secondary | ICD-10-CM | POA: Diagnosis present

## 2017-07-02 DIAGNOSIS — O219 Vomiting of pregnancy, unspecified: Secondary | ICD-10-CM

## 2017-07-02 DIAGNOSIS — F329 Major depressive disorder, single episode, unspecified: Secondary | ICD-10-CM | POA: Insufficient documentation

## 2017-07-02 LAB — CBC
HEMATOCRIT: 38.8 % (ref 36.0–46.0)
HEMOGLOBIN: 13.1 g/dL (ref 12.0–15.0)
MCH: 30.2 pg (ref 26.0–34.0)
MCHC: 33.8 g/dL (ref 30.0–36.0)
MCV: 89.4 fL (ref 78.0–100.0)
Platelets: 325 10*3/uL (ref 150–400)
RBC: 4.34 MIL/uL (ref 3.87–5.11)
RDW: 13.1 % (ref 11.5–15.5)
WBC: 10.5 10*3/uL (ref 4.0–10.5)

## 2017-07-02 LAB — COMPREHENSIVE METABOLIC PANEL
ALBUMIN: 4.1 g/dL (ref 3.5–5.0)
ALK PHOS: 50 U/L (ref 38–126)
ALT: 39 U/L (ref 14–54)
AST: 25 U/L (ref 15–41)
Anion gap: 13 (ref 5–15)
BUN: 10 mg/dL (ref 6–20)
CALCIUM: 9.1 mg/dL (ref 8.9–10.3)
CO2: 24 mmol/L (ref 22–32)
CREATININE: 0.79 mg/dL (ref 0.44–1.00)
Chloride: 97 mmol/L — ABNORMAL LOW (ref 101–111)
GFR calc Af Amer: >60 mL/min (ref >60)
GFR calc non Af Amer: >60 mL/min (ref >60)
GLUCOSE: 120 mg/dL — AB (ref 65–99)
Potassium: 3.5 mmol/L (ref 3.5–5.1)
SODIUM: 134 mmol/L — AB (ref 135–145)
Total Bilirubin: 0.5 mg/dL (ref 0.3–1.2)
Total Protein: 7.4 g/dL (ref 6.5–8.1)

## 2017-07-02 LAB — URINALYSIS, ROUTINE W REFLEX MICROSCOPIC
Bilirubin Urine: NEGATIVE
Glucose, UA: NEGATIVE mg/dL
Hgb urine dipstick: NEGATIVE
KETONES UR: 80 mg/dL — AB
Nitrite: NEGATIVE
PH: 5 (ref 5.0–8.0)
PROTEIN: 30 mg/dL — AB
Specific Gravity, Urine: 1.025 (ref 1.005–1.030)

## 2017-07-02 MED ORDER — M.V.I. ADULT IV INJ
Freq: Once | INTRAVENOUS | Status: AC
  Administered 2017-07-02: 15:00:00 via INTRAVENOUS
  Filled 2017-07-02: qty 1000

## 2017-07-02 MED ORDER — PROMETHAZINE HCL 25 MG/ML IJ SOLN
25.0000 mg | Freq: Once | INTRAMUSCULAR | Status: AC
  Administered 2017-07-02: 25 mg via INTRAVENOUS
  Filled 2017-07-02: qty 1

## 2017-07-02 MED ORDER — PROMETHAZINE HCL 25 MG PO TABS
12.5000 mg | ORAL_TABLET | Freq: Four times a day (QID) | ORAL | 2 refills | Status: DC | PRN
Start: 2017-07-02 — End: 2017-12-09

## 2017-07-02 MED ORDER — FAMOTIDINE IN NACL 20-0.9 MG/50ML-% IV SOLN
20.0000 mg | Freq: Once | INTRAVENOUS | Status: AC
  Administered 2017-07-02: 20 mg via INTRAVENOUS
  Filled 2017-07-02: qty 50

## 2017-07-02 MED ORDER — DEXTROSE 5 % IN LACTATED RINGERS IV BOLUS
1000.0000 mL | Freq: Once | INTRAVENOUS | Status: AC
  Administered 2017-07-02: 1000 mL via INTRAVENOUS

## 2017-07-02 MED ORDER — PROMETHAZINE HCL 25 MG RE SUPP: 25.0000 mg | Freq: Four times a day (QID) | RECTAL | 2 refills | Status: DC | PRN

## 2017-07-02 NOTE — MAU Note (Signed)
Pt C/O vomiting for the past several days, vomited 15 times yesterday, not holding down liquids.  Feels very weak, was advised to come to MAU.  Has appt with [facility] on Tuesday.  Denies pain or bleeding.

## 2017-07-02 NOTE — MAU Provider Note (Signed)
Chief Complaint: Emesis During Pregnancy   First Provider Initiated Contact with Patient 07/02/17 1300      SUBJECTIVE HPI: Brandy [name] is a 31 y.o. G2P0010 at [redacted]w[redacted]d by LMP who presents to maternity admissions reporting nausea/vomiting making her unable to keep down food or fluids x 24 hours. She is taking Diclegis as prescribed but cannot keep it down. There are no other associated symptoms. IUP was confirmed by [place] on 06/21/17 in MAU.  She reports anxiety about the pregnancy and wants to makes sure everything is Ok. She has her initial OB with [facility] scheduled in 4 days. She denies abdominal pain, vaginal bleeding, vaginal itching/burning, urinary symptoms, h/a, dizziness, or fever/chills.     HPI  Past Medical History:  Diagnosis Date  . Anxiety   . Depression   . Gallstones   . History of nephrolithiasis   . Kidney stone   . Migraine    Past Surgical History:  Procedure Laterality Date  . CHOLECYSTECTOMY  2014  . [place], 2004  . TYMPANOSTOMY TUBE PLACEMENT     Social History   Social History  . Marital status: Married    Spouse name: N/A  . Number of children: 0  . Years of education: N/A   Occupational History  . [employer]    Social History Main Topics  . Smoking status: Never Smoker  . Smokeless tobacco: Never Used  . Alcohol use Yes     Comment: social-1 every couple of months  . Drug use: No  . Sexual activity: Yes    Birth control/ protection: Condom   Other Topics Concern  . Not on file   Social History Narrative  . No narrative on file   No current facility-administered medications on file prior to encounter.    Current Outpatient Prescriptions on File Prior to Encounter  Medication Sig Dispense Refill  . famotidine (PEPCID) 20 MG tablet Take 1 tablet (20 mg total) by mouth daily. Take daily every morning 30 tablet 2   Allergies  Allergen Reactions  . Penicillins Hives and Other (See Comments)    Childhood  reaction Has patient had a PCN reaction causing immediate rash, facial/tongue/throat swelling, SOB or lightheadedness with hypotension: No Has patient had a PCN reaction causing severe rash involving mucus membranes or skin necrosis: No Has patient had a PCN reaction that required hospitalization No Has patient had a PCN reaction occurring within the last 10 years: No If all of the above answers are "NO", then may proceed with Cephalosporin use.     ROS:  Review of Systems  Constitutional: Negative for chills, fatigue and fever.  Respiratory: Negative for shortness of breath.   Cardiovascular: Negative for chest pain.  Gastrointestinal: Positive for nausea and vomiting. Negative for constipation and diarrhea.  Genitourinary: Negative for difficulty urinating, dysuria, flank pain, pelvic pain, vaginal bleeding, vaginal discharge and vaginal pain.  Neurological: Negative for dizziness and headaches.  Psychiatric/Behavioral: Negative.      I have reviewed patient's Past Medical Hx, Surgical Hx, Family Hx, Social Hx, medications and allergies.   Physical Exam   Patient Vitals for the past 24 hrs:  BP Temp Temp src Pulse Resp Height Weight  07/02/17 1557 108/63 99.2 F (37.3 C) Oral 89 18 - -  07/02/17 1056 109/66 98.1 F (36.7 C) Oral 82 18 5' 8.5" (1.74 m) 204 lb (92.5 kg)   Constitutional: Well-developed, well-nourished female in no acute distress.  Cardiovascular: normal rate Respiratory: normal  effort GI: Abd soft, non-tender. Pos BS x 4 MS: Extremities nontender, no edema, normal ROM Neurologic: Alert and oriented x 4.  GU: Neg CVAT.    LAB RESULTS Results for orders placed or performed during the hospital encounter of 07/02/17 (from the past 24 hour(s))  Urinalysis, Routine w reflex microscopic     Status: Abnormal   Collection Time: 07/02/17 11:00 AM  Result Value Ref Range   Color, Urine AMBER (A) YELLOW   APPearance CLOUDY (A) CLEAR   Specific Gravity, Urine  1.025 1.005 - 1.030   pH 5.0 5.0 - 8.0   Glucose, UA NEGATIVE NEGATIVE mg/dL   Hgb urine dipstick NEGATIVE NEGATIVE   Bilirubin Urine NEGATIVE NEGATIVE   Ketones, ur 80 (A) NEGATIVE mg/dL   Protein, ur 30 (A) NEGATIVE mg/dL   Nitrite NEGATIVE NEGATIVE   Leukocytes, UA MODERATE (A) NEGATIVE   RBC / HPF 0-5 0 - 5 RBC/hpf   WBC, UA 6-30 0 - 5 WBC/hpf   Bacteria, UA MANY (A) NONE SEEN   Squamous Epithelial / LPF 0-5 (A) NONE SEEN   Mucus PRESENT   CBC     Status: None   Collection Time: 07/02/17  1:31 PM  Result Value Ref Range   WBC 10.5 4.0 - 10.5 K/uL   RBC 4.34 3.87 - 5.11 MIL/uL   Hemoglobin 13.1 12.0 - 15.0 g/dL   HCT 38.8 36.0 - 46.0 %   MCV 89.4 78.0 - 100.0 fL   MCH 30.2 26.0 - 34.0 pg   MCHC 33.8 30.0 - 36.0 g/dL   RDW 13.1 11.5 - 15.5 %   Platelets 325 150 - 400 K/uL  Comprehensive metabolic panel     Status: Abnormal   Collection Time: 07/02/17  1:31 PM  Result Value Ref Range   Sodium 134 (L) 135 - 145 mmol/L   Potassium 3.5 3.5 - 5.1 mmol/L   Chloride 97 (L) 101 - 111 mmol/L   CO2 24 22 - 32 mmol/L   Glucose, Bld 120 (H) 65 - 99 mg/dL   BUN 10 6 - 20 mg/dL   Creatinine, Ser 0.79 0.44 - 1.00 mg/dL   Calcium 9.1 8.9 - 10.3 mg/dL   Total Protein 7.4 6.5 - 8.1 g/dL   Albumin 4.1 3.5 - 5.0 g/dL   AST 25 15 - 41 U/L   ALT 39 14 - 54 U/L   Alkaline Phosphatase 50 38 - 126 U/L   Total Bilirubin 0.5 0.3 - 1.2 mg/dL   GFR calc non Af Amer >60 >60 mL/min   GFR calc Af Amer >60 >60 mL/min   Anion gap 13 5 - 15       IMAGING Bedside US today reveals IUP with FHR 148   MAU Management/MDM: Ordered labs and reviewed results.  Dehydration noted with 80 ketones so IV fluids ordered, LR x 1000 ml and MVI x 1000 ml.  Phenergan 25 mg IV and Pepcid 20 mg IV given.  CBC and CMP.  Labs wnl, reviewed normal results with pt.  Bedside US reveals IUP with normal FHT.  Reassurance provided. Rx for Phenergan 12.5-25 mg PO or Phenergan 25 mg suppositories Q 6 hours PRN.  F/U with  [facility] as scheduled on Tuesday.  Return to MAU as needed for emergencies. Pt discharged with strict first trimester/vomiting precautions.  ASSESSMENT 1. Nausea and vomiting during pregnancy prior to [redacted] weeks gestation     PLAN Discharge home Allergies as of 07/02/2017  Reactions   Penicillins Hives, Other (See Comments)   Childhood reaction Has patient had a PCN reaction causing immediate rash, facial/tongue/throat swelling, SOB or lightheadedness with hypotension: No Has patient had a PCN reaction causing severe rash involving mucus membranes or skin necrosis: No Has patient had a PCN reaction that required hospitalization No Has patient had a PCN reaction occurring within the last 10 years: No If all of the above answers are "NO", then may proceed with Cephalosporin use.      Medication List    TAKE these medications   famotidine 20 MG tablet Commonly known as:  PEPCID Take 1 tablet (20 mg total) by mouth daily. Take daily every morning   prenatal multivitamin Tabs tablet Take 1 tablet by mouth daily at 12 noon.   progesterone 200 MG capsule Commonly known as:  PROMETRIUM Take 1 capsule by mouth daily.   promethazine 25 MG tablet Commonly known as:  PHENERGAN Take 0.5-1 tablets (12.5-25 mg total) by mouth every 6 (six) hours as needed.   promethazine 25 MG suppository Commonly known as:  PHENERGAN Place 1 suppository (25 mg total) rectally every 6 (six) hours as needed for nausea or vomiting. Take either Phenergan tablets by mouth or suppositories in the vagina or rectum. Do not take both medications at the same time.   Vitamin D (Ergocalciferol) 50000 units Caps capsule Commonly known as:  DRISDOL Take 50,000 Units by mouth once a week. Every 5 days      [facility] Follow up.   Specialty:  Obstetrics and Gynecology Why:  As scheduled, return to MAU as needed for emergencies Contact information: [address]. [address] [place] [address] [phone]          Fatima Blank Certified Nurse-Midwife 07/02/2017  4:21 PM

## 2017-07-02 NOTE — Progress Notes (Signed)
D/c instructions reviewed, prescription discussed.  Pt instructed to hold on driving after phenergan to know how it will affect her.  She is d/c home in stable condition, without n/v with husband.

## 2017-07-03 ENCOUNTER — Other Ambulatory Visit: Payer: Self-pay | Admitting: Advanced Practice Midwife

## 2017-07-03 LAB — CULTURE, OB URINE

## 2017-07-03 MED ORDER — PROMETHAZINE HCL 25 MG RE SUPP: 25.0000 mg | Freq: Four times a day (QID) | RECTAL | 2 refills | Status: DC | PRN

## 2017-07-15 ENCOUNTER — Encounter (HOSPITAL_COMMUNITY): Payer: Self-pay

## 2017-07-15 LAB — OB RESULTS CONSOLE ANTIBODY SCREEN: Antibody Screen: NEGATIVE

## 2017-07-15 LAB — OB RESULTS CONSOLE RPR: RPR: NONREACTIVE

## 2017-07-15 LAB — OB RESULTS CONSOLE ABO/RH: RH TYPE: POSITIVE

## 2017-07-15 LAB — OB RESULTS CONSOLE GC/CHLAMYDIA
Chlamydia: NEGATIVE
Gonorrhea: NEGATIVE

## 2017-07-15 LAB — OB RESULTS CONSOLE HEPATITIS B SURFACE ANTIGEN: HEP B S AG: NEGATIVE

## 2017-07-15 LAB — OB RESULTS CONSOLE RUBELLA ANTIBODY, IGM: RUBELLA: IMMUNE

## 2017-07-15 LAB — OB RESULTS CONSOLE HIV ANTIBODY (ROUTINE TESTING): HIV: NONREACTIVE

## 2017-08-31 NOTE — L&D Delivery Note (Signed)
Delivery Note Pt pushed for an hour and a half then took a 20 min break. She pushed for another 63mins and at 7:00 AM a viable female was delivered via Vaginal, Spontaneous (Presentation:ROT ;  ).  APGAR: 7, 9; weight pending .   Placenta status: delivered intact, .  Cord:3vc  with the following complications: none.  Cord pH: n/a  Anesthesia:  Epidural Episiotomy:  none Lacerations: 2nd degree;Perineal Suture Repair: 2.0 vicryl and 4-0 vicryl Est. Blood Loss (mL): 100  Mom to postpartum.  Baby to Couplet care / Skin to Skin.  Brandy [name] 02/02/2018, 7:29 AM

## 2017-09-20 ENCOUNTER — Encounter (HOSPITAL_COMMUNITY): Payer: Self-pay

## 2017-09-27 ENCOUNTER — Encounter: Payer: Self-pay | Admitting: Family Medicine

## 2017-09-27 ENCOUNTER — Encounter: Payer: Self-pay | Admitting: *Deleted

## 2017-09-27 ENCOUNTER — Ambulatory Visit: Payer: 59 | Admitting: Family Medicine

## 2017-09-27 VITALS — BP 90/80 | HR 93 | Temp 98.5°F | Resp 16 | Ht 68.5 in | Wt 213.4 lb

## 2017-09-27 DIAGNOSIS — R0981 Nasal congestion: Secondary | ICD-10-CM | POA: Diagnosis not present

## 2017-09-27 DIAGNOSIS — J069 Acute upper respiratory infection, unspecified: Secondary | ICD-10-CM

## 2017-09-27 LAB — POC INFLUENZA A&B (BINAX/QUICKVUE)
INFLUENZA A, POC: NEGATIVE
Influenza B, POC: NEGATIVE

## 2017-09-27 MED ORDER — BECLOMETHASONE DIPROPIONATE 80 MCG/ACT NA AERS: 1.0000 | INHALATION_SPRAY | Freq: Every day | NASAL | 0 refills | Status: DC | PRN

## 2017-09-27 NOTE — Progress Notes (Signed)
ACUTE VISIT   HPI:  Chief Complaint  Patient presents with  . Sinus Problem    Sx started 3days ago  . URI    [name] is a 32 y.o. female, who is here today complaining of 2-3 day of respiratory symptoms.   She thinks she may have a sinus infection.  Non productive cough.  No fever, chill, or myalgias. Positive for nasal congestion, rhinorrhea,and post nasal drainage.  Denies chest pain, dyspnea, or wheezing.  No Hx of recent travel. No known sick contact but she works in the [employer] [place].. + Hx of allergies.  OTC medications for this problem: Mucinex.  Symptoms otherwise stable.  She is 5 months pregnancy.   Review of Systems  Constitutional: Positive for fatigue. Negative for activity change, appetite change, chills and fever.  HENT: Positive for congestion, postnasal drip, rhinorrhea and sinus pressure. Negative for ear pain, mouth sores, sore throat, trouble swallowing and voice change.   Eyes: Negative for discharge, redness and itching.  Respiratory: Positive for cough. Negative for shortness of breath and wheezing.   Gastrointestinal: Negative for abdominal pain, diarrhea, nausea and vomiting.  Musculoskeletal: Negative for myalgias and neck pain.  Skin: Negative for rash.  Allergic/Immunologic: Positive for environmental allergies.  Neurological: Positive for headaches (Frontal pressure.). Negative for weakness.  Hematological: Negative for adenopathy. Does not bruise/bleed easily.  Psychiatric/Behavioral: The patient is nervous/anxious.     Current Outpatient Medications on File Prior to Visit  Medication Sig Dispense Refill  . Prenatal Vit-Fe Fumarate-FA (PRENATAL MULTIVITAMIN) TABS tablet Take 1 tablet by mouth daily at 12 noon.    . progesterone (PROMETRIUM) 200 MG capsule Take 1 capsule by mouth daily.    . Vitamin D, Ergocalciferol, (DRISDOL) 50000 units CAPS capsule Take 50,000 Units by mouth once a week. Every 5 days    . famotidine  (PEPCID) 20 MG tablet Take 1 tablet (20 mg total) by mouth daily. Take daily every morning (Patient not taking: Reported on 09/27/2017) 30 tablet 2  . promethazine (PHENERGAN) 25 MG suppository Place 1 suppository (25 mg total) rectally every 6 (six) hours as needed for nausea or vomiting. Take either Phenergan tablets by mouth or suppositories in the vagina or rectum. Do not take both medications at the same time. (Patient not taking: Reported on 09/27/2017) 12 suppository 2  . promethazine (PHENERGAN) 25 MG tablet Take 0.5-1 tablets (12.5-25 mg total) by mouth every 6 (six) hours as needed. (Patient not taking: Reported on 09/27/2017) 30 tablet 2   No current facility-administered medications on file prior to visit.      Past Medical History:  Diagnosis Date  . Anxiety   . Depression   . Gallstones   . History of nephrolithiasis   . Kidney stone   . Migraine    Allergies  Allergen Reactions  . Penicillins Hives and Other (See Comments)    Childhood reaction Has patient had a PCN reaction causing immediate rash, facial/tongue/throat swelling, SOB or lightheadedness with hypotension: No Has patient had a PCN reaction causing severe rash involving mucus membranes or skin necrosis: No Has patient had a PCN reaction that required hospitalization No Has patient had a PCN reaction occurring within the last 10 years: No If all of the above answers are "NO", then may proceed with Cephalosporin use.     Social History   Socioeconomic History  . Marital status: Married    Spouse name: None  . Number of children: 0  . Years  of education: None  . Highest education level: None  Social Needs  . Financial resource strain: None  . Food insecurity - worry: None  . Food insecurity - inability: None  . Transportation needs - medical: None  . Transportation needs - non-medical: None  Occupational History  . Occupation: Consulting civil engineer  Tobacco Use  . Smoking status: Never Smoker  .  Smokeless tobacco: Never Used  Substance and Sexual Activity  . Alcohol use: Yes    Comment: social-1 every couple of months  . Drug use: No  . Sexual activity: Yes    Birth control/protection: Condom  Other Topics Concern  . None  Social History Narrative  . None    Vitals:   09/27/17 1640  BP: 90/80  Pulse: 93  Resp: 16  Temp: 98.5 F (36.9 C)  SpO2: 97%   Body mass index is 31.98 kg/m.   Physical Exam  Nursing note and vitals reviewed. Constitutional: She is oriented to person, place, and time. She appears well-developed. She does not appear ill. No distress.  HENT:  Head: Normocephalic and atraumatic.  Right Ear: Tympanic membrane, external ear and ear canal normal.  Left Ear: Tympanic membrane, external ear and ear canal normal.  Nose: Rhinorrhea present. Right sinus exhibits no maxillary sinus tenderness and no frontal sinus tenderness. Left sinus exhibits no maxillary sinus tenderness and no frontal sinus tenderness.  Mouth/Throat: Oropharynx is clear and moist and mucous membranes are normal.  Nasal voice. Normal sinus transillumination. Mild sinus pressure upon percussion of sinuses.  Eyes: Conjunctivae are normal. Pupils are equal, round, and reactive to light.  Cardiovascular: Normal rate and regular rhythm.  No murmur heard. Respiratory: Effort normal and breath sounds normal. No respiratory distress.  Lymphadenopathy:       Head (right side): No submandibular adenopathy present.       Head (left side): No submandibular adenopathy present.    She has cervical adenopathy (< 1 cm).       Right cervical: Posterior cervical adenopathy present.       Left cervical: Posterior cervical adenopathy present.  Neurological: She is alert and oriented to person, place, and time. She has normal strength. Gait normal.  Skin: Skin is warm. No rash noted. No erythema.  Psychiatric: Her speech is normal. Her mood appears anxious.  Well groomed, good eye contact.     ASSESSMENT AND PLAN:   [name] was seen today for sinus problem and uri.  Diagnoses and all orders for this visit:  Nasal sinus congestion -     Beclomethasone Dipropionate (QNASL) 80 MCG/ACT AERS; Place 1 spray into the nose daily as needed.  URI, acute -     POC Influenza A&B (Binax test)    Explained that symptoms suggests a viral etiology and that symptomatic treatment is recommended, so I do not think abx is needed at this time. Instructed to monitor for signs of complications, including new onset of fever among some, clearly instructed about warning signs. I also explained that cough and nasal congestion can last a few days and sometimes weeks. To help with sinus congestion, recommended intranasal steroid, QNASL.  Instructed to call her OB/GYN before starting medication. Nasal irrigations with saline.   -[name] was advised to seek immediate medical attention if sudden worsening symptoms or to follow if they persist or if new concerns arise.       Gini Caputo G. [place], MD  [facility]. [place] office.

## 2017-09-27 NOTE — Progress Notes (Signed)
poc

## 2017-09-27 NOTE — Patient Instructions (Addendum)
A few things to remember from today's visit:   Nasal sinus congestion - Plan: Beclomethasone Dipropionate (QNASL) 80 MCG/ACT AERS  URI, acute  viral infections are self-limited and we treat each symptom depending of severity.  Over the counter medications as decongestants and cold medications usually help, they need to be taken with caution if there is a history of high blood pressure or palpitations. Tylenol also helps with most symptoms (headache, muscle aching, fever,etc). Qnals may help with sinus pressure,please ask you ob before starting medication. This one is safer than other in pregnancy.  Plenty of fluids. Honey helps with cough. Steam inhalations helps with runny nose, nasal congestion, and may prevent sinus infections. Cough and nasal congestion could last a few days and sometimes weeks. Please follow in not any better in 1-2 weeks or if symptoms get worse.   Please be sure medication list is accurate. If a new problem present, please set up appointment sooner than planned today.

## 2017-09-28 ENCOUNTER — Encounter (HOSPITAL_COMMUNITY): Payer: Self-pay

## 2017-09-28 ENCOUNTER — Inpatient Hospital Stay (HOSPITAL_COMMUNITY)
Admission: AD | Admit: 2017-09-28 | Discharge: 2017-09-29 | Disposition: A | Discharge status: Home / Self Care | Payer: 59 | Source: Ambulatory Visit | Attending: Obstetrics and Gynecology | Admitting: Obstetrics and Gynecology

## 2017-09-28 DIAGNOSIS — R112 Nausea with vomiting, unspecified: Secondary | ICD-10-CM | POA: Diagnosis not present

## 2017-09-28 DIAGNOSIS — R197 Diarrhea, unspecified: Secondary | ICD-10-CM | POA: Insufficient documentation

## 2017-09-28 DIAGNOSIS — R1013 Epigastric pain: Secondary | ICD-10-CM | POA: Insufficient documentation

## 2017-09-28 DIAGNOSIS — Z880 Allergy status to penicillin: Secondary | ICD-10-CM | POA: Insufficient documentation

## 2017-09-28 DIAGNOSIS — O26892 Other specified pregnancy related conditions, second trimester: Secondary | ICD-10-CM | POA: Diagnosis not present

## 2017-09-28 DIAGNOSIS — O210 Mild hyperemesis gravidarum: Secondary | ICD-10-CM | POA: Diagnosis present

## 2017-09-28 DIAGNOSIS — R82998 Other abnormal findings in urine: Secondary | ICD-10-CM

## 2017-09-28 DIAGNOSIS — Z3A19 19 weeks gestation of pregnancy: Secondary | ICD-10-CM | POA: Diagnosis not present

## 2017-09-28 DIAGNOSIS — J069 Acute upper respiratory infection, unspecified: Secondary | ICD-10-CM

## 2017-09-28 LAB — URINALYSIS, ROUTINE W REFLEX MICROSCOPIC
BILIRUBIN URINE: NEGATIVE
Glucose, UA: NEGATIVE mg/dL
Hgb urine dipstick: NEGATIVE
KETONES UR: 80 mg/dL — AB
Nitrite: NEGATIVE
PH: 5 (ref 5.0–8.0)
PROTEIN: NEGATIVE mg/dL
Specific Gravity, Urine: 1.019 (ref 1.005–1.030)

## 2017-09-28 MED ORDER — FAMOTIDINE IN NACL 20-0.9 MG/50ML-% IV SOLN
20.0000 mg | Freq: Once | INTRAVENOUS | Status: AC
  Administered 2017-09-29: 20 mg via INTRAVENOUS
  Filled 2017-09-28: qty 50

## 2017-09-28 MED ORDER — PROMETHAZINE HCL 25 MG/ML IJ SOLN
25.0000 mg | Freq: Once | INTRAMUSCULAR | Status: AC
  Administered 2017-09-29: 25 mg via INTRAVENOUS
  Filled 2017-09-28: qty 1

## 2017-09-28 NOTE — MAU Note (Signed)
Pt reports nausea and vomiting all day today. Pt states she is not able to keep anything down. Tried phenergan around 3 hours ago, and has not helped. Denies fever at home. Pt reports sinus congestion and thought vomiting was due to that. 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 Pt reports mid/upp abdominal pain that she states is from vomiting. Denies vaginal bleeding.

## 2017-09-28 NOTE — MAU Provider Note (Signed)
Chief Complaint:  Emesis   First Provider Initiated Contact with Patient 09/28/17 2332     HPI: Brandy [name] is a 32 y.o. G2P0010 at 73w6dwho presents to maternity admissions reporting nausea and vomiting today.  Threw up Phenergan.  Was seen yesterday for URI and flu tests were negative.  C/O epigastric pain, feels that it is from vomiting. She reports good fetal movement, denies LOF, vaginal bleeding, vaginal itching/burning, urinary symptoms, h/a, dizziness, n/v, diarrhea, constipation or fever/chills.  She denies headache, visual changes or RUQ abdominal pain.  Emesis   This is a new problem. The current episode started today. The problem occurs 5 to 10 times per day. The emesis has an appearance of bile and stomach contents. Associated symptoms include abdominal pain, coughing, diarrhea, a fever (low grade) and URI. Pertinent negatives include no chest pain, chills or myalgias. Risk factors include ill contacts.    RN Note: Pt reports nausea and vomiting all day today. Pt states she is not able to keep anything down. Tried phenergan around 3 hours ago, and has not helped. Denies fever at home. Pt reports sinus congestion and thought vomiting was due to that. 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 Pt reports mid/upp abdominal pain that she states is from vomiting. Denies vaginal bleeding.     Past Medical History: Past Medical History:  Diagnosis Date  . Anxiety   . Depression   . Gallstones   . History of nephrolithiasis   . Kidney stone   . Migraine     Past obstetric history: OB History  Gravida Para Term Preterm AB Living  2 0 0 0 1 0  SAB TAB Ectopic Multiple Live Births  1 0 0 0      # Outcome Date GA Lbr Len/2nd Weight Sex Delivery Anes PTL Lv  2 Current           1 SAB             Obstetric Comments  Age first period 25    Past Surgical History: Past Surgical History:  Procedure Laterality Date  . CHOLECYSTECTOMY  2014  . [place], 2004  . TYMPANOSTOMY TUBE PLACEMENT      Family History: Family History  Problem Relation Age of Onset  . Diabetes Mother   . Mental illness Mother   . Asthma Mother   . Stomach cancer Mother   . Colon polyps Mother   . Heart disease Mother   . Hypertension Mother   . Arthritis Mother   . Diabetes Father   . Hypertension Father   . Colon cancer Maternal Grandmother   . Diabetes Paternal Grandmother   . Breast cancer Maternal Aunt   . Lung cancer Maternal Aunt   . Diabetes Paternal Aunt   . Diabetes Maternal Uncle   . Diabetes Maternal Uncle     Social History: Social History   Tobacco Use  . Smoking status: Never Smoker  . Smokeless tobacco: Never Used  Substance Use Topics  . Alcohol use: Yes    Comment: social-1 every couple of months  . Drug use: No    Allergies:  Allergies  Allergen Reactions  . Penicillins Hives and Other (See Comments)    Childhood reaction Has patient had a PCN reaction causing immediate rash, facial/tongue/throat swelling, SOB or lightheadedness with hypotension: No Has patient had a PCN reaction causing severe rash involving mucus membranes or skin necrosis: No Has patient had  a PCN reaction that required hospitalization No Has patient had a PCN reaction occurring within the last 10 years: No If all of the above answers are "NO", then may proceed with Cephalosporin use.     Meds:  Medications Prior to Admission  Medication Sig Dispense Refill Last Dose  . Prenatal Vit-Fe Fumarate-FA (PRENATAL MULTIVITAMIN) TABS tablet Take 1 tablet by mouth daily at 12 noon.   09/28/2017 at Unknown time  . promethazine (PHENERGAN) 25 MG tablet Take 0.5-1 tablets (12.5-25 mg total) by mouth every 6 (six) hours as needed. 30 tablet 2 09/28/2017 at Unknown time  . Beclomethasone Dipropionate (QNASL) 80 MCG/ACT AERS Place 1 spray into the nose daily as needed. 8.7 g 0   . famotidine (PEPCID) 20 MG tablet Take 1 tablet (20 mg total) by  mouth daily. Take daily every morning (Patient not taking: Reported on 09/27/2017) 30 tablet 2 Not Taking  . progesterone (PROMETRIUM) 200 MG capsule Take 1 capsule by mouth daily.   Taking  . promethazine (PHENERGAN) 25 MG suppository Place 1 suppository (25 mg total) rectally every 6 (six) hours as needed for nausea or vomiting. Take either Phenergan tablets by mouth or suppositories in the vagina or rectum. Do not take both medications at the same time. (Patient not taking: Reported on 09/27/2017) 12 suppository 2 Not Taking  . Vitamin D, Ergocalciferol, (DRISDOL) 50000 units CAPS capsule Take 50,000 Units by mouth once a week. Every 5 days   Taking    I have reviewed patient's Past Medical Hx, Surgical Hx, Family Hx, Social Hx, medications and allergies.   ROS:  Review of Systems  Constitutional: Positive for fever (low grade). Negative for chills.  Respiratory: Positive for cough.   Cardiovascular: Negative for chest pain.  Gastrointestinal: Positive for abdominal pain, diarrhea and vomiting.  Musculoskeletal: Negative for myalgias.   Other systems negative  Physical Exam   Patient Vitals for the past 24 hrs:  BP Temp Temp src Pulse Resp SpO2  09/28/17 2345 - - - (!) 116 20 98 %  09/28/17 2318 - - - (!) 114 - -  09/28/17 2251 127/75 98.4 F (36.9 C) Oral (!) 122 (!) 21 99 %   Constitutional: Well-developed, well-nourished female in no acute distress, but appears ill. .  Cardiovascular: slightly tachycardic rate and regular rhythm Respiratory: normal effort, clear to auscultation bilaterally GI: Abd soft, non-tender, gravid appropriate for gestational age.   No rebound or guarding. MS: Extremities nontender, no edema, normal ROM Neurologic: Alert and oriented x 4.  GU: Neg CVAT.  PELVIC EXAM:  deferred  FHT:   165  Labs: Results for orders placed or performed during the hospital encounter of 09/28/17 (from the past 24 hour(s))  Urinalysis, Routine w reflex microscopic      Status: Abnormal   Collection Time: 09/28/17 10:40 PM  Result Value Ref Range   Color, Urine YELLOW YELLOW   APPearance CLOUDY (A) CLEAR   Specific Gravity, Urine 1.019 1.005 - 1.030   pH 5.0 5.0 - 8.0   Glucose, UA NEGATIVE NEGATIVE mg/dL   Hgb urine dipstick NEGATIVE NEGATIVE   Bilirubin Urine NEGATIVE NEGATIVE   Ketones, ur 80 (A) NEGATIVE mg/dL   Protein, ur NEGATIVE NEGATIVE mg/dL   Nitrite NEGATIVE NEGATIVE   Leukocytes, UA MODERATE (A) NEGATIVE   RBC / HPF 0-5 0 - 5 RBC/hpf   WBC, UA 6-30 0 - 5 WBC/hpf   Bacteria, UA MANY (A) NONE SEEN   Squamous Epithelial / LPF 0-5 (  A) NONE SEEN   Mucus PRESENT   CBC     Status: Abnormal   Collection Time: 09/29/17 12:05 AM  Result Value Ref Range   WBC 17.4 (H) 4.0 - 10.5 K/uL   RBC 4.43 3.87 - 5.11 MIL/uL   Hemoglobin 13.9 12.0 - 15.0 g/dL   HCT 40.8 36.0 - 46.0 %   MCV 92.1 78.0 - 100.0 fL   MCH 31.4 26.0 - 34.0 pg   MCHC 34.1 30.0 - 36.0 g/dL   RDW 13.3 11.5 - 15.5 %   Platelets 285 150 - 400 K/uL  Comprehensive metabolic panel     Status: Abnormal   Collection Time: 09/29/17 12:05 AM  Result Value Ref Range   Sodium 136 135 - 145 mmol/L   Potassium 4.0 3.5 - 5.1 mmol/L   Chloride 105 101 - 111 mmol/L   CO2 19 (L) 22 - 32 mmol/L   Glucose, Bld 109 (H) 65 - 99 mg/dL   BUN 9 6 - 20 mg/dL   Creatinine, Ser 0.55 0.44 - 1.00 mg/dL   Calcium 9.1 8.9 - 10.3 mg/dL   Total Protein 7.0 6.5 - 8.1 g/dL   Albumin 3.4 (L) 3.5 - 5.0 g/dL   AST 32 15 - 41 U/L   ALT 65 (H) 14 - 54 U/L   Alkaline Phosphatase 68 38 - 126 U/L   Total Bilirubin 0.5 0.3 - 1.2 mg/dL   GFR calc non Af Amer >60 >60 mL/min   GFR calc Af Amer >60 >60 mL/min   Anion gap 12 5 - 15    Imaging:  No results found.  MAU Course/MDM: I have ordered labs and reviewed results. ALT slightly elevated as is WBC.  Discussed with Dr Melba Coon.  Will add differential and amylase/lipase.  Developed new diarrhea while here. Continues to vomit despite Phenergan, will add  zofran  Consult Dr Sandford Craze with presentation, exam findings and test results.  She recommends supportive care and tx of possible UTI, Urine to culture Treatments in MAU included IV hydration, Phenergan, GI cocktail for epigastric pain.    Stopped vomiting after zofran. Able to keep gi cocktail down and felt better.  Rocephin 1g given IV to start antibiotic tx  While pt was nauseated.  Assessment: Single IUP at [redacted]w[redacted]d Nausea and vomiting Epigastric pain r/t vomiting New diarrhea while here Leukocytes in urine  Plan: Discharge home Urine to culture Rx Zofran to use only if Phenergan not working Rx Keflex for possible UTI Push fluids when able Follow up in Office for prenatal visits and recheck of status  Encouraged to return here or to other Urgent Care/ED if she develops worsening of symptoms, increase in pain, fever, or other concerning symptoms.   Pt stable at time of discharge.  Hansel Feinstein CNM, MSN Certified Nurse-Midwife 09/29/2017 12:24 AM

## 2017-09-29 DIAGNOSIS — R112 Nausea with vomiting, unspecified: Secondary | ICD-10-CM | POA: Diagnosis not present

## 2017-09-29 LAB — DIFFERENTIAL
BASOS PCT: 0 %
Basophils Absolute: 0 10*3/uL (ref 0.0–0.1)
EOS ABS: 0 10*3/uL (ref 0.0–0.7)
EOS PCT: 0 %
LYMPHS PCT: 3 %
Lymphs Abs: 0.5 10*3/uL — ABNORMAL LOW (ref 0.7–4.0)
MONO ABS: 0.7 10*3/uL (ref 0.1–1.0)
Monocytes Relative: 4 %
NEUTROS PCT: 93 %
Neutro Abs: 15.8 10*3/uL — ABNORMAL HIGH (ref 1.7–7.7)

## 2017-09-29 LAB — CBC
HCT: 40.8 % (ref 36.0–46.0)
HEMOGLOBIN: 13.9 g/dL (ref 12.0–15.0)
MCH: 31.4 pg (ref 26.0–34.0)
MCHC: 34.1 g/dL (ref 30.0–36.0)
MCV: 92.1 fL (ref 78.0–100.0)
Platelets: 285 10*3/uL (ref 150–400)
RBC: 4.43 MIL/uL (ref 3.87–5.11)
RDW: 13.3 % (ref 11.5–15.5)
WBC: 17.4 10*3/uL — ABNORMAL HIGH (ref 4.0–10.5)

## 2017-09-29 LAB — COMPREHENSIVE METABOLIC PANEL
ALK PHOS: 68 U/L (ref 38–126)
ALT: 65 U/L — AB (ref 14–54)
ANION GAP: 12 (ref 5–15)
AST: 32 U/L (ref 15–41)
Albumin: 3.4 g/dL — ABNORMAL LOW (ref 3.5–5.0)
BUN: 9 mg/dL (ref 6–20)
CALCIUM: 9.1 mg/dL (ref 8.9–10.3)
CO2: 19 mmol/L — ABNORMAL LOW (ref 22–32)
CREATININE: 0.55 mg/dL (ref 0.44–1.00)
Chloride: 105 mmol/L (ref 101–111)
GFR calc non Af Amer: >60 mL/min (ref >60)
Glucose, Bld: 109 mg/dL — ABNORMAL HIGH (ref 65–99)
Potassium: 4 mmol/L (ref 3.5–5.1)
Sodium: 136 mmol/L (ref 135–145)
Total Bilirubin: 0.5 mg/dL (ref 0.3–1.2)
Total Protein: 7 g/dL (ref 6.5–8.1)

## 2017-09-29 LAB — AMYLASE: AMYLASE: 45 U/L (ref 28–100)

## 2017-09-29 LAB — LIPASE, BLOOD: LIPASE: 32 U/L (ref 11–51)

## 2017-09-29 MED ORDER — CEPHALEXIN 500 MG PO CAPS: 500.0000 mg | ORAL_CAPSULE | Freq: Four times a day (QID) | ORAL | 2 refills | Status: DC

## 2017-09-29 MED ORDER — DEXTROSE 5 % IN LACTATED RINGERS IV BOLUS
1000.0000 mL | Freq: Once | INTRAVENOUS | Status: AC
  Administered 2017-09-29: 1000 mL via INTRAVENOUS

## 2017-09-29 MED ORDER — ONDANSETRON 4 MG PO TBDP: 4.0000 mg | ORAL_TABLET | Freq: Four times a day (QID) | ORAL | 0 refills | Status: DC | PRN

## 2017-09-29 MED ORDER — ONDANSETRON HCL 4 MG/2ML IJ SOLN
4.0000 mg | Freq: Once | INTRAMUSCULAR | Status: AC
  Administered 2017-09-29: 4 mg via INTRAVENOUS
  Filled 2017-09-29: qty 2

## 2017-09-29 MED ORDER — DEXTROSE 5 % IV SOLN
1.0000 g | Freq: Once | INTRAVENOUS | Status: AC
  Administered 2017-09-29: 1 g via INTRAVENOUS
  Filled 2017-09-29: qty 10

## 2017-09-29 MED ORDER — GI COCKTAIL ~~LOC~~
30.0000 mL | Freq: Once | ORAL | Status: AC
  Administered 2017-09-29: 30 mL via ORAL
  Filled 2017-09-29 (×2): qty 30

## 2017-09-29 NOTE — Discharge Instructions (Signed)
Upper Respiratory Infection, Adult Most upper respiratory infections (URIs) are caused by a virus. A URI affects the nose, throat, and upper air passages. The most common type of URI is often called "the common cold." Follow these instructions at home:  Take medicines only as told by your doctor.  Gargle warm saltwater or take cough drops to comfort your throat as told by your doctor.  Use a warm mist humidifier or inhale steam from a shower to increase air moisture. This may make it easier to breathe.  Drink enough fluid to keep your pee (urine) clear or pale yellow.  Eat soups and other clear broths.  Have a healthy diet.  Rest as needed.  Go back to work when your fever is gone or your doctor says it is okay. ? You may need to stay home longer to avoid giving your URI to others. ? You can also wear a face mask and wash your hands often to prevent spread of the virus.  Use your inhaler more if you have asthma.  Do not use any tobacco products, including cigarettes, chewing tobacco, or electronic cigarettes. If you need help quitting, ask your doctor. Contact a doctor if:  You are getting worse, not better.  Your symptoms are not helped by medicine.  You have chills.  You are getting more short of breath.  You have brown or red mucus.  You have yellow or brown discharge from your nose.  You have pain in your face, especially when you bend forward.  You have a fever.  You have puffy (swollen) neck glands.  You have pain while swallowing.  You have white areas in the back of your throat. Get help right away if:  You have very bad or constant: ? Headache. ? Ear pain. ? Pain in your forehead, behind your eyes, and over your cheekbones (sinus pain). ? Chest pain.  You have long-lasting (chronic) lung disease and any of the following: ? Wheezing. ? Long-lasting cough. ? Coughing up blood. ? A change in your usual mucus.  You have a stiff neck.  You have  changes in your: ? Vision. ? Hearing. ? Thinking. ? Mood. This information is not intended to replace advice given to you by your health care provider. Make sure you discuss any questions you have with your health care provider. Document Released: 02/03/2008 Document Revised: 04/19/2016 Document Reviewed: 11/22/2013 Elsevier Interactive Patient Education  2018 [employer]. Viral Gastroenteritis, Adult Viral gastroenteritis is also known as the stomach flu. This condition is caused by certain germs (viruses). These germs can be passed from person to person very easily (are very contagious). This condition can cause sudden watery poop (diarrhea), fever, and throwing up (vomiting). Having watery poop and throwing up can make you feel weak and cause you to get dehydrated. Dehydration can make you tired and thirsty, make you have a dry mouth, and make it so you pee (urinate) less often. Older adults and people with other diseases or a weak defense system (immune system) are at higher risk for dehydration. It is important to replace the fluids that you lose from having watery poop and throwing up. Follow these instructions at home: Follow instructions from your doctor about how to care for yourself at home. Eating and drinking  Follow these instructions as told by your doctor:  Take an oral rehydration solution (ORS). This is a drink that is sold at pharmacies and stores.  Drink clear fluids in small amounts as you are  able, such as: ? Water. ? Ice chips. ? Diluted fruit juice. ? Low-calorie sports drinks.  Eat bland, easy-to-digest foods in small amounts as you are able, such as: ? Bananas. ? Applesauce. ? Rice. ? Low-fat (lean) meats. ? Toast. ? Crackers.  Avoid fluids that have a lot of sugar or caffeine in them.  Avoid alcohol.  Avoid spicy or fatty foods.  General instructions  Drink enough fluid to keep your pee (urine) clear or pale yellow.  Wash your hands often. If you  cannot use soap and water, use hand sanitizer.  Make sure that all people in your home wash their hands well and often.  Rest at home while you get better.  Take over-the-counter and prescription medicines only as told by your doctor.  Watch your condition for any changes.  Take a warm bath to help with any burning or pain from having watery poop.  Keep all follow-up visits as told by your doctor. This is important. Contact a doctor if:  You cannot keep fluids down.  Your symptoms get worse.  You have new symptoms.  You feel light-headed or dizzy.  You have muscle cramps. Get help right away if:  You have chest pain.  You feel very weak or you pass out (faint).  You see blood in your throw-up.  Your throw-up looks like coffee grounds.  You have bloody or black poop (stools) or poop that look like tar.  You have a very bad headache, a stiff neck, or both.  You have a rash.  You have very bad pain, cramping, or bloating in your belly (abdomen).  You have trouble breathing.  You are breathing very quickly.  Your heart is beating very quickly.  Your skin feels cold and clammy.  You feel confused.  You have pain when you pee.  You have signs of dehydration, such as: ? Dark pee, hardly any pee, or no pee. ? Cracked lips. ? Dry mouth. ? Sunken eyes. ? Sleepiness. ? Weakness. This information is not intended to replace advice given to you by your health care provider. Make sure you discuss any questions you have with your health care provider. Document Released: 02/03/2008 Document Revised: 03/06/2016 Document Reviewed: 04/23/2015 Elsevier Interactive Patient Education  2017 [employer].

## 2017-09-30 LAB — CULTURE, OB URINE: Culture: NO GROWTH

## 2017-10-07 ENCOUNTER — Ambulatory Visit: Payer: Self-pay

## 2017-10-07 ENCOUNTER — Telehealth: Payer: Self-pay | Admitting: Family Medicine

## 2017-10-07 NOTE — Telephone Encounter (Unsigned)
Copied from [place]. Topic: Appointment Scheduling - Scheduling Inquiry for Clinic >> Oct 07, 2017  5:16 PM Neva Seat wrote: Pt wants to scheduled for Fri after 3:30 on Friday 10-08-17.  There were no available appointments with any of the doctors.   She is expecting and is having on and off pain in her chest.  She spoke with NT and went to her OBGYN.  They referred her to her PCP. Pt is needing a time tomorrow after 3:30 if possible to be worked in with a dr.

## 2017-10-07 NOTE — Telephone Encounter (Signed)
Pt. called to report chest pain at [redacted] weeks pregnant.  C/o onset of chest pain on Tuesday, 2/5, occurring beneath left breast, in mid- sternal region, and in upper, mid-back; reported the pain radiates up into the left neck and jaw. Described as a "fullness."  Stated that the pain was "moderate to severe", and lasted about 30 min.  Reported she became short of breath, nauseated, and vomited x 1, at that time.  Reported she has had intermittent chest pain, since the 1st episode on 2/5.   Also, stated with the subsequent episodes, the duration has been 5-10 minutes.  Denied further nausea or vomiting since 1st episode.  Denied sweating assoc. with the episodes of pain. Rated chest pain level at 6/10 with 1st episode on 2/5, and 2-3/10 with subsequent episodes.  Stated she has intermittent pain in left neck; feels "deep inside", and can occur separately, from episodes of anterior chest pain.  Denied palpating  any pulsatile mass on left neck.  Denied chest pain at time of Triage call, but stated she last experienced this earlier today.  Per protocol, advised to go to the ER.  Verb. understanding. Stated her husband is available to take her to the hospital, and plans to go to [facility] in [place].      Reason for Disposition . [1] Intermittent  chest pain or "angina" AND [2] increasing in severity or frequency  (Exception: pains lasting a few seconds)  Answer Assessment - Initial Assessment Questions 1. LOCATION: "Where does it hurt?"       Beneath left breast and up into mid sternal area, and up into (L) neck and jaw.    2. RADIATION: "Does the pain go anywhere else?" (e.g., into neck, jaw, arms, back)     Radiates to neck and jaw 3. ONSET: "When did the chest pain begin?" (Minutes, hours or days)     Started  2 days ago beneath breast to sternum; lasted 30 min.  4. PATTERN "Does the pain come and go, or has it been constant since it started?"  "Does it get worse with exertion?"      Intermittent;  "sporadic"  5. DURATION: "How long does it last" (e.g., seconds, minutes, hours)     Varies from about 10 - 30 minutes,  6. SEVERITY: "How bad is the pain?"  (e.g., Scale 1-10; mild, moderate, or severe)    - MILD (1-3): doesn't interfere with normal activities     - MODERATE (4-7): interferes with normal activities or awakens from sleep    - SEVERE (8-10): excruciating pain, unable to do any normal activities       Moderate @ 6/10 on 2/5, then episodes more mild @ 2-3/10 since then.   7. CARDIAC RISK FACTORS: "Do you have any history of heart problems or risk factors for heart disease?" (e.g., prior heart attack, angina; high blood pressure, diabetes, being overweight, high cholesterol, smoking, or strong family history of heart disease)     Mother has CHF; uncle had heart attack at age 21 8. PULMONARY RISK FACTORS: "Do you have any history of lung disease?"  (e.g., blood clots in lung, asthma, emphysema, birth control pills)     no 9. CAUSE: "What do you think is causing the chest pain?"     unknown 10. OTHER SYMPTOMS: "Do you have any other symptoms?" (e.g., dizziness, nausea, vomiting, sweating, fever, difficulty breathing, cough)       Headache, shortness of breath, neck pain, nauseated and vomited x 1  on 10/05/17; denied sweating.    11. PREGNANCY: "Is there any chance you are pregnant?" "When was your last menstrual period?"       [redacted] weeks pregnant  Protocols used: CHEST PAIN-A-AH

## 2017-10-08 NOTE — Telephone Encounter (Signed)
Symptoms could be related to GERD and/or anxiety. She could try OTC Zantac. Please advise to also call her gyn's office The possibility that this chest pain is cardiac is low. If chest pain gets worse, she may need to go to the ER.  Thanks, BJ

## 2017-10-08 NOTE — Telephone Encounter (Signed)
Pt did not go to ED as directed. Please see TE.

## 2017-10-08 NOTE — Telephone Encounter (Signed)
Pt has been seen in ED> Notes and test results in Care Everywhere.   Dr. Martinique - FYI. Thanks!

## 2017-10-08 NOTE — Telephone Encounter (Signed)
Per [facility] triage from yesterday: Pt. called to report chest pain at [redacted] weeks pregnant.  C/o onset of chest pain on Tuesday, 2/5, occurring beneath left breast, in mid- sternal region, and in upper, mid-back; reported the pain radiates up into the left neck and jaw. Described as a "fullness."  Stated that the pain was "moderate to severe", and lasted about 30 min.  Reported she became short of breath, nauseated, and vomited x 1, at that time.  Reported she has had intermittent chest pain, since the 1st episode on 2/5.   Also, stated with the subsequent episodes, the duration has been 5-10 minutes.  Denied further nausea or vomiting since 1st episode.  Denied sweating assoc. with the episodes of pain. Rated chest pain level at 6/10 with 1st episode on 2/5, and 2-3/10 with subsequent episodes.  Stated she has intermittent pain in left neck; feels "deep inside", and can occur separately, from episodes of anterior chest pain.  Denied palpating  any pulsatile mass on left neck.  Denied chest pain at time of Triage call, but stated she last experienced this earlier today.  Per protocol, advised to go to the ER.  Verb. understanding. Stated her husband is available to take her to the hospital, and plans to go to [facility] in [place].  Pt did NOT go to ED as directed. She now wants to be "worked in" to your schedule this afternoon after 3:30pm.   Dr. Martinique - Please advise. Thanks!

## 2017-10-11 ENCOUNTER — Ambulatory Visit: Payer: Self-pay | Admitting: *Deleted

## 2017-10-11 NOTE — Telephone Encounter (Signed)
3  Way  Call  Initiated with Sheena  At  [facility] has  An  appt  tomorrow  At 9  Pm  With a  Cardiologist .   Pt  Was  Advised  To  Keep the  Appointment. At this  Time  Pt denies  Any chest pain or  Shortness  Of breath She  Is  Speaking in  Complete  Sentences. She  Was  Advised  To   Call  911  For  Any  Chest pain or  Shortness of breath.  Reason for Disposition . [1] MODERATE neck pain (e.g., interferes with normal activities AND [2] present > 3 days  Answer Assessment - Initial Assessment Questions 1. ONSET: "When did the pain begin?"        6  Days    2. LOCATION: "Where does it hurt?"        l     Anterior   Going down the  Ear    3. PATTERN "Does the pain come and go, or has it been constant since it started?"        Alternates   4. SEVERITY: "How bad is the pain?"  (Scale 1-10; or mild, moderate, severe)   - MILD (1-3): doesn't interfere with normal activities    - MODERATE (4-7): interferes with normal activities or awakens from sleep    - SEVERE (8-10):  excruciating pain, unable to do any normal activities         3  5. RADIATION: "Does the pain go anywhere else, shoot into your arms?"        Pain radiates  Into  Jaw  And  Ears   6. CORD SYMPTOMS: "Any weakness or numbness of the arms or legs?"       no 7. CAUSE: "What do you think is causing the neck pain?"       unknown 8. NECK OVERUSE: "Any recent activities that involved turning or twisting the neck?"        No 9. OTHER SYMPTOMS: "Do you have any other symptoms?" (e.g., headache, fever, chest pain, difficulty breathing, neck swelling)    Pt   Was  Seen  4  Nights  Ago  At  [facility]   For  Chest  Pain  Had  Ct  And  Ultrasound of  Legs which  Was  Normal  D  dymer was   eleaved   10. PREGNANCY: "Is there any chance you are pregnant?" "When was your last menstrual period?"       22   Weeks preg  Protocols used: NECK PAIN OR STIFFNESS-A-AH

## 2017-12-09 ENCOUNTER — Ambulatory Visit: Payer: 59 | Admitting: Adult Health

## 2017-12-09 ENCOUNTER — Encounter: Payer: Self-pay | Admitting: Adult Health

## 2017-12-09 VITALS — BP 114/70 | Temp 98.3°F | Wt 225.0 lb

## 2017-12-09 DIAGNOSIS — M25562 Pain in left knee: Secondary | ICD-10-CM

## 2017-12-09 NOTE — Progress Notes (Signed)
Subjective:    Patient ID: Brandy [name], female    DOB: 02-Jun-1986, 32 y.o.   MRN: [id number]  HPI 32 year old female who  has a past medical history of Anxiety, Depression, Gallstones, History of nephrolithiasis, Kidney stone, and Migraine.  She presents to the office today for the acute complaint of left knee pain that started 2-3 days ago. She reports that the pain is described as " throbbing pain". Pain is worse with weight bearing and twisting motions,  but does have pain with rest. She denies any trauma. Has not noticed any swelling, redness, or warmth. Pain is located on the inside of the knee  She has tried heat - makes the pain worse  She has used ice - has not noticed a difference.   She is [redacted] weeks pregnant.   Review of Systems See HPI   Past Medical History:  Diagnosis Date  . Anxiety   . Depression   . Gallstones   . History of nephrolithiasis   . Kidney stone   . Migraine     Social History   Socioeconomic History  . Marital status: Married    Spouse name: Not on file  . Number of children: 0  . Years of education: Not on file  . Highest education level: Not on file  Occupational History  . Occupation: Consulting civil engineer  Social Needs  . Financial resource strain: Not on file  . Food insecurity:    Worry: Not on file    Inability: Not on file  . Transportation needs:    Medical: Not on file    Non-medical: Not on file  Tobacco Use  . Smoking status: Never Smoker  . Smokeless tobacco: Never Used  Substance and Sexual Activity  . Alcohol use: Yes    Comment: social-1 every couple of months  . Drug use: No  . Sexual activity: Yes    Birth control/protection: Condom  Lifestyle  . Physical activity:    Days per week: Not on file    Minutes per session: Not on file  . Stress: Not on file  Relationships  . Social connections:    Talks on phone: Not on file    Gets together: Not on file    Attends religious service: Not on file    Active member  of club or organization: Not on file    Attends meetings of clubs or organizations: Not on file    Relationship status: Not on file  . Intimate partner violence:    Fear of current or ex partner: Not on file    Emotionally abused: Not on file    Physically abused: Not on file    Forced sexual activity: Not on file  Other Topics Concern  . Not on file  Social History Narrative  . Not on file    Past Surgical History:  Procedure Laterality Date  . CHOLECYSTECTOMY  2014  . [place], 2004  . TYMPANOSTOMY TUBE PLACEMENT      Family History  Problem Relation Age of Onset  . Diabetes Mother   . Mental illness Mother   . Asthma Mother   . Stomach cancer Mother   . Colon polyps Mother   . Heart disease Mother   . Hypertension Mother   . Arthritis Mother   . Diabetes Father   . Hypertension Father   . Colon cancer Maternal Grandmother   . Diabetes Paternal Grandmother   . Breast cancer  Maternal Aunt   . Lung cancer Maternal Aunt   . Diabetes Paternal Aunt   . Diabetes Maternal Uncle   . Diabetes Maternal Uncle     Allergies  Allergen Reactions  . Penicillins Hives and Other (See Comments)    Childhood reaction Has patient had a PCN reaction causing immediate rash, facial/tongue/throat swelling, SOB or lightheadedness with hypotension: No Has patient had a PCN reaction causing severe rash involving mucus membranes or skin necrosis: No Has patient had a PCN reaction that required hospitalization No Has patient had a PCN reaction occurring within the last 10 years: No If all of the above answers are "NO", then may proceed with Cephalosporin use.     Current Outpatient Medications on File Prior to Visit  Medication Sig Dispense Refill  . aspirin 81 MG chewable tablet aspirin 81 mg chewable tablet  Chew 1 tablet every day by oral route.    . Prenatal Vit-Fe Fumarate-FA (PRENATAL MULTIVITAMIN) TABS tablet Take 1 tablet by mouth daily at 12 noon.    .  ranitidine (ZANTAC) 150 MG capsule Take 150 mg by mouth daily.     No current facility-administered medications on file prior to visit.     BP 114/70   Temp 98.3 F (36.8 C) (Oral)   Wt 225 lb (102.1 kg)   LMP 05/13/2017   BMI 33.71 kg/m       Objective:   Physical Exam  Constitutional: She is oriented to person, place, and time. She appears well-developed and well-nourished. No distress.  Cardiovascular: Normal rate, regular rhythm, normal heart sounds and intact distal pulses. Exam reveals no gallop.  No murmur heard. Pulmonary/Chest: Effort normal and breath sounds normal. No respiratory distress. She has no wheezes. She has no rales. She exhibits no tenderness.  Musculoskeletal: Normal range of motion. She exhibits tenderness. She exhibits no edema.       Left knee: She exhibits bony tenderness. She exhibits normal range of motion, no swelling, normal alignment, no LCL laxity, normal patellar mobility, normal meniscus and no MCL laxity. Tenderness found. Medial joint line and MCL tenderness noted. No lateral joint line, no LCL and no patellar tendon tenderness noted.  Neurological: She is alert and oriented to person, place, and time. She has normal reflexes.  Skin: Skin is warm and dry. She is not diaphoretic.  Psychiatric: She has a normal mood and affect. Her behavior is normal. Thought content normal.  Nursing note and vitals reviewed.     Assessment & Plan:  1. Acute pain of left knee - Appears as strain  - No concern for DVT - Tylenol/Ice/Elevation  - Can use compression  - Follow up as needed  Dorothyann Peng, NP

## 2017-12-22 ENCOUNTER — Encounter: Payer: Self-pay | Admitting: Family Medicine

## 2017-12-22 ENCOUNTER — Ambulatory Visit: Payer: 59 | Admitting: Family Medicine

## 2017-12-22 VITALS — BP 102/60 | Temp 98.2°F | Ht 68.5 in | Wt 221.0 lb

## 2017-12-22 DIAGNOSIS — J302 Other seasonal allergic rhinitis: Secondary | ICD-10-CM | POA: Diagnosis not present

## 2017-12-22 NOTE — Progress Notes (Signed)
Subjective:    Patient ID: Brandy [name], female    DOB: 11-14-1985, 32 y.o.   MRN: [id number]  Chief Complaint  Patient presents with  . Headache    HPI Patient was seen today for acute concern.  Patient endorses headache, left eye pain, facial pain since Sunday.  Patient denies any trauma to her face, nasal drainage, rhinorrhea, sore throat, cough, ear pain, fever, chills.  Patient had a history of allergies in the past.  Patient tried taking Claritin with little to no relief.  Patient states she is afraid to take anything as she will be [redacted] weeks pregnant tomorrow.  This is her first child.  Past Medical History:  Diagnosis Date  . Anxiety   . Depression   . Gallstones   . History of nephrolithiasis   . Kidney stone   . Migraine     Allergies  Allergen Reactions  . Penicillins Hives and Other (See Comments)    Childhood reaction Has patient had a PCN reaction causing immediate rash, facial/tongue/throat swelling, SOB or lightheadedness with hypotension: No Has patient had a PCN reaction causing severe rash involving mucus membranes or skin necrosis: No Has patient had a PCN reaction that required hospitalization No Has patient had a PCN reaction occurring within the last 10 years: No If all of the above answers are "NO", then may proceed with Cephalosporin use.     ROS General: Denies fever, chills, night sweats, changes in weight, changes in appetite HEENT: Denies headaches, ear pain, changes in vision, rhinorrhea, sore throat  +facial pain, HA, L eye pain. CV: Denies CP, palpitations, SOB, orthopnea Pulm: Denies SOB, cough, wheezing GI: Denies abdominal pain, nausea, vomiting, diarrhea, constipation GU: Denies dysuria, hematuria, frequency, vaginal discharge Msk: Denies muscle cramps, joint pains Neuro: Denies weakness, numbness, tingling Skin: Denies rashes, bruising Psych: Denies depression, anxiety, hallucinations     Objective:    Blood pressure 102/60,  temperature 98.2 F (36.8 C), temperature source Oral, height 5' 8.5" (1.74 m), weight 221 lb (100.2 kg), last menstrual period 05/13/2017.   Gen. Pleasant, well-nourished, in no distress, normal affect   HEENT: [place]/AT, face symmetric, no scleral icterus, PERRLA, nares patent without drainage, pharynx without erythema or exudate.  TMs full bilaterally.  No cervical lymphadenopathy. Lungs: no accessory muscle use, CTAB, no wheezes or rales Cardiovascular: RRR, no m/r/g, no peripheral edema Abdomen: gravid, BS present Neuro:  A&Ox3, CN II-XII intact, normal gait   Wt Readings from Last 3 Encounters:  12/22/17 221 lb (100.2 kg)  12/09/17 225 lb (102.1 kg)  09/27/17 213 lb 6.4 oz (96.8 kg)    Lab Results  Component Value Date   WBC 17.4 (H) 09/29/2017   HGB 13.9 09/29/2017   HCT 40.8 09/29/2017   PLT 285 09/29/2017   GLUCOSE 109 (H) 09/29/2017   CHOL 141 09/24/2014   TRIG 48.0 09/24/2014   HDL 49.00 09/24/2014   LDLCALC 82 09/24/2014   ALT 65 (H) 09/29/2017   AST 32 09/29/2017   NA 136 09/29/2017   K 4.0 09/29/2017   CL 105 09/29/2017   CREATININE 0.55 09/29/2017   BUN 9 09/29/2017   CO2 19 (L) 09/29/2017   TSH 2.26 05/04/2017   HGBA1C 5.3 06/09/2016    Assessment/Plan:  Seasonal allergies  -Discussed supportive care including saline nasal and OTC allergy medication such as Zyrtec. -Patient encouraged to switch from Claritin to a different allergy medication as she does not seem to be getting any relief from Claritin. -Okay to  take Tylenol as needed for any pain/discomfort. -Discussed possibility of symptoms becoming sinus infection. -Patient given RTC precautions for any worsening of symptoms   follow-up PRN  Grier Mitts, MD

## 2017-12-23 MED ORDER — SODIUM CHLORIDE 0.9 % IV SOLN
1000.00 | INTRAVENOUS | Status: DC
Start: ? — End: 2017-12-23

## 2017-12-26 ENCOUNTER — Inpatient Hospital Stay (HOSPITAL_COMMUNITY)
Admission: AD | Admit: 2017-12-26 | Discharge: 2017-12-27 | Disposition: A | Discharge status: Home / Self Care | Payer: 59 | Source: Ambulatory Visit | Attending: Obstetrics and Gynecology | Admitting: Obstetrics and Gynecology

## 2017-12-26 ENCOUNTER — Encounter (HOSPITAL_COMMUNITY): Payer: Self-pay | Admitting: *Deleted

## 2017-12-26 DIAGNOSIS — Z7982 Long term (current) use of aspirin: Secondary | ICD-10-CM | POA: Diagnosis not present

## 2017-12-26 DIAGNOSIS — R519 Headache, unspecified: Secondary | ICD-10-CM

## 2017-12-26 DIAGNOSIS — Z880 Allergy status to penicillin: Secondary | ICD-10-CM | POA: Insufficient documentation

## 2017-12-26 DIAGNOSIS — Z3689 Encounter for other specified antenatal screening: Secondary | ICD-10-CM

## 2017-12-26 DIAGNOSIS — Z3A32 32 weeks gestation of pregnancy: Secondary | ICD-10-CM

## 2017-12-26 DIAGNOSIS — O26893 Other specified pregnancy related conditions, third trimester: Secondary | ICD-10-CM | POA: Insufficient documentation

## 2017-12-26 DIAGNOSIS — R51 Headache: Secondary | ICD-10-CM | POA: Insufficient documentation

## 2017-12-26 DIAGNOSIS — E860 Dehydration: Secondary | ICD-10-CM | POA: Diagnosis not present

## 2017-12-26 LAB — URINALYSIS, ROUTINE W REFLEX MICROSCOPIC
Bilirubin Urine: NEGATIVE
Glucose, UA: NEGATIVE mg/dL
Ketones, ur: 80 mg/dL — AB
NITRITE: NEGATIVE
PH: 6 (ref 5.0–8.0)
Protein, ur: 30 mg/dL — AB
RBC / HPF: >50 RBC/hpf — ABNORMAL HIGH (ref 0–5)
SPECIFIC GRAVITY, URINE: 1.021 (ref 1.005–1.030)

## 2017-12-26 LAB — COMPREHENSIVE METABOLIC PANEL
ALT: 17 U/L (ref 14–54)
ANION GAP: 11 (ref 5–15)
AST: 16 U/L (ref 15–41)
Albumin: 3.1 g/dL — ABNORMAL LOW (ref 3.5–5.0)
Alkaline Phosphatase: 86 U/L (ref 38–126)
BUN: 8 mg/dL (ref 6–20)
CHLORIDE: 104 mmol/L (ref 101–111)
CO2: 22 mmol/L (ref 22–32)
Calcium: 9.3 mg/dL (ref 8.9–10.3)
Creatinine, Ser: 0.69 mg/dL (ref 0.44–1.00)
Glucose, Bld: 102 mg/dL — ABNORMAL HIGH (ref 65–99)
Potassium: 3.7 mmol/L (ref 3.5–5.1)
Sodium: 137 mmol/L (ref 135–145)
Total Bilirubin: 0.2 mg/dL — ABNORMAL LOW (ref 0.3–1.2)
Total Protein: 6.8 g/dL (ref 6.5–8.1)

## 2017-12-26 LAB — CBC
HCT: 36 % (ref 36.0–46.0)
Hemoglobin: 12.2 g/dL (ref 12.0–15.0)
MCH: 30.5 pg (ref 26.0–34.0)
MCHC: 33.9 g/dL (ref 30.0–36.0)
MCV: 90 fL (ref 78.0–100.0)
PLATELETS: 267 10*3/uL (ref 150–400)
RBC: 4 MIL/uL (ref 3.87–5.11)
RDW: 13.2 % (ref 11.5–15.5)
WBC: 10.5 10*3/uL (ref 4.0–10.5)

## 2017-12-26 LAB — PROTEIN / CREATININE RATIO, URINE
CREATININE, URINE: 193 mg/dL
PROTEIN CREATININE RATIO: 0.13 mg/mg{creat} (ref 0.00–0.15)
TOTAL PROTEIN, URINE: 26 mg/dL

## 2017-12-26 MED ORDER — DEXAMETHASONE SODIUM PHOSPHATE 10 MG/ML IJ SOLN
10.0000 mg | Freq: Once | INTRAMUSCULAR | Status: AC
Start: 2017-12-26 — End: 2017-12-26
  Administered 2017-12-26: 10 mg via INTRAVENOUS
  Filled 2017-12-26: qty 1

## 2017-12-26 MED ORDER — DIPHENHYDRAMINE HCL 50 MG/ML IJ SOLN
25.0000 mg | Freq: Once | INTRAMUSCULAR | Status: AC
  Administered 2017-12-26: 25 mg via INTRAVENOUS
  Filled 2017-12-26: qty 1

## 2017-12-26 MED ORDER — LACTATED RINGERS IV BOLUS
1000.0000 mL | Freq: Once | INTRAVENOUS | Status: AC
  Administered 2017-12-26: 1000 mL via INTRAVENOUS

## 2017-12-26 MED ORDER — METOCLOPRAMIDE HCL 5 MG/ML IJ SOLN
10.0000 mg | Freq: Once | INTRAMUSCULAR | Status: AC
  Administered 2017-12-26: 10 mg via INTRAVENOUS
  Filled 2017-12-26: qty 2

## 2017-12-26 NOTE — MAU Note (Signed)
Pt presents to MAU c/o nausea and vomitting and HA pt reports a BP at home that was 155/93. 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 Pt has been experiencing blurry vision in her left eye. Pt reports +FM no bleeding or LOF. Some braxton hicks.

## 2017-12-26 NOTE — MAU Provider Note (Signed)
History     CSN: [id number]  Arrival date and time: 12/26/17 2301   First Provider Initiated Contact with Patient 12/26/17 2336      Chief Complaint  Patient presents with  . Migraine  . Emesis  . Nausea   G3P2002 @33 .3 wks here with HA. HA started about 1 week ago. Located left peri-orbital. She used Tylenol and heat with little improvement. Seeing black spots and some blurry vision. Denies epigastric pain. Started having N/V last night. Cannot tolerate much po. Denies VB, LOF, or ctx. +FM. Was seen in [place] ED 4 days ago for HA and MRI showed an enlarged pituitary and ?small adenoma vs enlargement from pregnancy. She was referred to Neuro and has appt this week.    OB History    Gravida  2   Para  0   Term  0   Preterm  0   AB  1   Living  0     SAB  1   TAB  0   Ectopic  0   Multiple  0   Live Births           Obstetric Comments  Age first period 34        Past Medical History:  Diagnosis Date  . Anxiety   . Depression   . Gallstones   . History of nephrolithiasis   . Kidney stone   . Migraine     Past Surgical History:  Procedure Laterality Date  . CHOLECYSTECTOMY  2014  . [place], 2004  . TYMPANOSTOMY TUBE PLACEMENT      Family History  Problem Relation Age of Onset  . Diabetes Mother   . Mental illness Mother   . Asthma Mother   . Stomach cancer Mother   . Colon polyps Mother   . Heart disease Mother   . Hypertension Mother   . Arthritis Mother   . Diabetes Father   . Hypertension Father   . Colon cancer Maternal Grandmother   . Diabetes Paternal Grandmother   . Breast cancer Maternal Aunt   . Lung cancer Maternal Aunt   . Diabetes Paternal Aunt   . Diabetes Maternal Uncle   . Diabetes Maternal Uncle     Social History   Tobacco Use  . Smoking status: Never Smoker  . Smokeless tobacco: Never Used  Substance Use Topics  . Alcohol use: Yes    Comment: social-1 every couple of months  . Drug  use: No    Allergies:  Allergies  Allergen Reactions  . Penicillins Hives and Other (See Comments)    Childhood reaction Has patient had a PCN reaction causing immediate rash, facial/tongue/throat swelling, SOB or lightheadedness with hypotension: No Has patient had a PCN reaction causing severe rash involving mucus membranes or skin necrosis: No Has patient had a PCN reaction that required hospitalization No Has patient had a PCN reaction occurring within the last 10 years: No If all of the above answers are "NO", then may proceed with Cephalosporin use.     Medications Prior to Admission  Medication Sig Dispense Refill Last Dose  . acetaminophen (TYLENOL) 500 MG tablet Take 500 mg by mouth every 6 (six) hours as needed.   12/26/2017 at Unknown time  . aspirin 81 MG chewable tablet aspirin 81 mg chewable tablet  Chew 1 tablet every day by oral route.   Past Week at Unknown time  . Prenatal Vit-Fe Fumarate-FA (PRENATAL MULTIVITAMIN) TABS tablet Take  1 tablet by mouth daily at 12 noon.   12/26/2017 at Unknown time  . pseudoephedrine (SUDAFED) 30 MG tablet Take 30 mg by mouth every 4 (four) hours as needed for congestion.   12/26/2017 at Unknown time  . ranitidine (ZANTAC) 150 MG capsule Take 150 mg by mouth daily.   12/26/2017 at Unknown time    Review of Systems  Eyes: Positive for visual disturbance.  Gastrointestinal: Negative for abdominal pain.  Genitourinary: Negative for vaginal bleeding.  Neurological: Positive for headaches.   Physical Exam   Blood pressure (!) 103/58, pulse 99, last menstrual period 05/13/2017. Patient Vitals for the past 24 hrs:  BP Pulse  12/27/17 0016 (!) 103/58 99  12/27/17 0001 (!) 94/56 88  12/26/17 2346 112/67 98  12/26/17 2321 120/74 (!) 108    Physical Exam  Nursing note and vitals reviewed. Constitutional: She is oriented to person, place, and time. She appears well-developed and well-nourished. No distress.  HENT:  Head: Normocephalic  and atraumatic.  Eyes: Pupils are equal, round, and reactive to light. Conjunctivae and EOM are normal. Lids are everted and swept, no foreign bodies found.  Neck: Normal range of motion.  Respiratory: Effort normal. No respiratory distress.  GI: Soft. She exhibits no distension. There is no tenderness.  gravid  Musculoskeletal: Normal range of motion.  Neurological: She is alert and oriented to person, place, and time.  Skin: Skin is warm and dry.  Psychiatric: Her mood appears anxious.  EFM: 150 bpm, mod variability, + accels, no decels Toco: none  Results for orders placed or performed during the hospital encounter of 12/26/17 (from the past 24 hour(s))  Protein / creatinine ratio, urine     Status: None   Collection Time: 12/26/17 11:10 PM  Result Value Ref Range   Creatinine, Urine 193.00 mg/dL   Total Protein, Urine 26 mg/dL   Protein Creatinine Ratio 0.13 0.00 - 0.15 mg/mg[Cre]  Urinalysis, Routine w reflex microscopic     Status: Abnormal   Collection Time: 12/26/17 11:10 PM  Result Value Ref Range   Color, Urine YELLOW YELLOW   APPearance HAZY (A) CLEAR   Specific Gravity, Urine 1.021 1.005 - 1.030   pH 6.0 5.0 - 8.0   Glucose, UA NEGATIVE NEGATIVE mg/dL   Hgb urine dipstick MODERATE (A) NEGATIVE   Bilirubin Urine NEGATIVE NEGATIVE   Ketones, ur 80 (A) NEGATIVE mg/dL   Protein, ur 30 (A) NEGATIVE mg/dL   Nitrite NEGATIVE NEGATIVE   Leukocytes, UA SMALL (A) NEGATIVE   RBC / HPF >50 (H) 0 - 5 RBC/hpf   WBC, UA 0-5 0 - 5 WBC/hpf   Bacteria, UA RARE (A) NONE SEEN   Squamous Epithelial / LPF 0-5 0 - 5   Mucus PRESENT   CBC     Status: None   Collection Time: 12/26/17 11:25 PM  Result Value Ref Range   WBC 10.5 4.0 - 10.5 K/uL   RBC 4.00 3.87 - 5.11 MIL/uL   Hemoglobin 12.2 12.0 - 15.0 g/dL   HCT 36.0 36.0 - 46.0 %   MCV 90.0 78.0 - 100.0 fL   MCH 30.5 26.0 - 34.0 pg   MCHC 33.9 30.0 - 36.0 g/dL   RDW 13.2 11.5 - 15.5 %   Platelets 267 150 - 400 K/uL   Comprehensive metabolic panel     Status: Abnormal   Collection Time: 12/26/17 11:25 PM  Result Value Ref Range   Sodium 137 135 - 145 mmol/L   Potassium  3.7 3.5 - 5.1 mmol/L   Chloride 104 101 - 111 mmol/L   CO2 22 22 - 32 mmol/L   Glucose, Bld 102 (H) 65 - 99 mg/dL   BUN 8 6 - 20 mg/dL   Creatinine, Ser 0.69 0.44 - 1.00 mg/dL   Calcium 9.3 8.9 - 10.3 mg/dL   Total Protein 6.8 6.5 - 8.1 g/dL   Albumin 3.1 (L) 3.5 - 5.0 g/dL   AST 16 15 - 41 U/L   ALT 17 14 - 54 U/L   Alkaline Phosphatase 86 38 - 126 U/L   Total Bilirubin 0.2 (L) 0.3 - 1.2 mg/dL   GFR calc non Af Amer >60 >60 mL/min   GFR calc Af Amer >60 >60 mL/min   Anion gap 11 5 - 15   MAU Course  Procedures LR Reglan Benadryl Decadron  MDM Labs ordered and reviewed. No evidence of pre-e. HA and nausea improved after meds. Tolerating po. Presentation, clinical findings, and plan discussed with Dr. Melba Coon. Plan for Neuro consult in 2 days as scheduled. Stable for discharge home.  Assessment and Plan   1. [redacted] weeks gestation of pregnancy   2. NST (non-stress test) reactive   3. Nonintractable episodic headache, unspecified headache type   4. Dehydration    Discharge home Follow up with OB as scheduled this week Follow up with Neuro as scheduled this week Rx Reglan Rx Benadryl  Allergies as of 12/27/2017      Reactions   Penicillins Hives, Other (See Comments)   Childhood reaction Has patient had a PCN reaction causing immediate rash, facial/tongue/throat swelling, SOB or lightheadedness with hypotension: No Has patient had a PCN reaction causing severe rash involving mucus membranes or skin necrosis: No Has patient had a PCN reaction that required hospitalization No Has patient had a PCN reaction occurring within the last 10 years: No If all of the above answers are "NO", then may proceed with Cephalosporin use.      Medication List    STOP taking these medications   pseudoephedrine 30 MG tablet Commonly  known as:  SUDAFED     TAKE these medications   acetaminophen 500 MG tablet Commonly known as:  TYLENOL Take 500 mg by mouth every 6 (six) hours as needed.   aspirin 81 MG chewable tablet aspirin 81 mg chewable tablet  Chew 1 tablet every day by oral route.   diphenhydrAMINE 25 MG tablet Commonly known as:  BENADRYL Take 1 tablet (25 mg total) by mouth every 6 (six) hours as needed (headache).   metoCLOPramide 10 MG tablet Commonly known as:  REGLAN Take 1 tablet (10 mg total) by mouth every 6 (six) hours as needed for nausea (headache).   prenatal multivitamin Tabs tablet Take 1 tablet by mouth daily at 12 noon.   ranitidine 150 MG capsule Commonly known as:  ZANTAC Take 150 mg by mouth daily.      Julianne Handler, CNM 12/27/2017, 12:58 AM

## 2017-12-27 DIAGNOSIS — R51 Headache: Secondary | ICD-10-CM

## 2017-12-27 DIAGNOSIS — O9989 Other specified diseases and conditions complicating pregnancy, childbirth and the puerperium: Secondary | ICD-10-CM

## 2017-12-27 DIAGNOSIS — E860 Dehydration: Secondary | ICD-10-CM

## 2017-12-27 DIAGNOSIS — Z3A32 32 weeks gestation of pregnancy: Secondary | ICD-10-CM

## 2017-12-27 MED ORDER — DIPHENHYDRAMINE HCL 25 MG PO TABS: 25.0000 mg | ORAL_TABLET | Freq: Four times a day (QID) | ORAL | 0 refills | Status: DC | PRN

## 2017-12-27 MED ORDER — METOCLOPRAMIDE HCL 10 MG PO TABS: 10.0000 mg | ORAL_TABLET | Freq: Four times a day (QID) | ORAL | 0 refills | Status: DC | PRN

## 2017-12-27 NOTE — Discharge Instructions (Signed)

## 2018-01-04 ENCOUNTER — Inpatient Hospital Stay (HOSPITAL_COMMUNITY): Payer: 59

## 2018-01-04 ENCOUNTER — Encounter (HOSPITAL_COMMUNITY): Payer: Self-pay

## 2018-01-04 ENCOUNTER — Other Ambulatory Visit: Payer: Self-pay

## 2018-01-04 ENCOUNTER — Inpatient Hospital Stay (HOSPITAL_COMMUNITY)
Admission: AD | Admit: 2018-01-04 | Discharge: 2018-01-05 | Disposition: A | Discharge status: Home / Self Care | Payer: 59 | Source: Ambulatory Visit | Attending: Obstetrics and Gynecology | Admitting: Obstetrics and Gynecology

## 2018-01-04 DIAGNOSIS — Z833 Family history of diabetes mellitus: Secondary | ICD-10-CM | POA: Insufficient documentation

## 2018-01-04 DIAGNOSIS — O26893 Other specified pregnancy related conditions, third trimester: Secondary | ICD-10-CM | POA: Diagnosis present

## 2018-01-04 DIAGNOSIS — R112 Nausea with vomiting, unspecified: Secondary | ICD-10-CM | POA: Insufficient documentation

## 2018-01-04 DIAGNOSIS — Z7982 Long term (current) use of aspirin: Secondary | ICD-10-CM | POA: Diagnosis not present

## 2018-01-04 DIAGNOSIS — Z9049 Acquired absence of other specified parts of digestive tract: Secondary | ICD-10-CM | POA: Insufficient documentation

## 2018-01-04 DIAGNOSIS — Z880 Allergy status to penicillin: Secondary | ICD-10-CM | POA: Insufficient documentation

## 2018-01-04 DIAGNOSIS — Z8261 Family history of arthritis: Secondary | ICD-10-CM | POA: Diagnosis not present

## 2018-01-04 DIAGNOSIS — Z87442 Personal history of urinary calculi: Secondary | ICD-10-CM | POA: Diagnosis not present

## 2018-01-04 DIAGNOSIS — Z8249 Family history of ischemic heart disease and other diseases of the circulatory system: Secondary | ICD-10-CM | POA: Insufficient documentation

## 2018-01-04 DIAGNOSIS — Z3A33 33 weeks gestation of pregnancy: Secondary | ICD-10-CM | POA: Insufficient documentation

## 2018-01-04 DIAGNOSIS — R51 Headache: Secondary | ICD-10-CM

## 2018-01-04 DIAGNOSIS — Z803 Family history of malignant neoplasm of breast: Secondary | ICD-10-CM | POA: Diagnosis not present

## 2018-01-04 DIAGNOSIS — Z9889 Other specified postprocedural states: Secondary | ICD-10-CM | POA: Insufficient documentation

## 2018-01-04 DIAGNOSIS — Z801 Family history of malignant neoplasm of trachea, bronchus and lung: Secondary | ICD-10-CM | POA: Insufficient documentation

## 2018-01-04 DIAGNOSIS — Z79899 Other long term (current) drug therapy: Secondary | ICD-10-CM | POA: Diagnosis not present

## 2018-01-04 DIAGNOSIS — O9989 Other specified diseases and conditions complicating pregnancy, childbirth and the puerperium: Secondary | ICD-10-CM | POA: Diagnosis not present

## 2018-01-04 DIAGNOSIS — R519 Headache, unspecified: Secondary | ICD-10-CM

## 2018-01-04 DIAGNOSIS — Z800 Family history of malignant neoplasm of digestive organs: Secondary | ICD-10-CM | POA: Diagnosis not present

## 2018-01-04 DIAGNOSIS — Z825 Family history of asthma and other chronic lower respiratory diseases: Secondary | ICD-10-CM | POA: Insufficient documentation

## 2018-01-04 DIAGNOSIS — Z818 Family history of other mental and behavioral disorders: Secondary | ICD-10-CM | POA: Diagnosis not present

## 2018-01-04 LAB — URINALYSIS, ROUTINE W REFLEX MICROSCOPIC
BILIRUBIN URINE: NEGATIVE
GLUCOSE, UA: NEGATIVE mg/dL
HGB URINE DIPSTICK: NEGATIVE
KETONES UR: NEGATIVE mg/dL
NITRITE: NEGATIVE
Protein, ur: NEGATIVE mg/dL
Specific Gravity, Urine: 1.013 (ref 1.005–1.030)
pH: 7 (ref 5.0–8.0)

## 2018-01-04 LAB — POCT PREGNANCY, URINE: PREG TEST UR: NEGATIVE

## 2018-01-04 MED ORDER — DIPHENHYDRAMINE HCL 50 MG/ML IJ SOLN
25.0000 mg | Freq: Once | INTRAMUSCULAR | Status: AC
Start: 2018-01-04 — End: 2018-01-04
  Administered 2018-01-04: 25 mg via INTRAVENOUS
  Filled 2018-01-04: qty 1

## 2018-01-04 MED ORDER — OXYCODONE-ACETAMINOPHEN 5-325 MG PO TABS
2.0000 | ORAL_TABLET | Freq: Once | ORAL | Status: AC
  Administered 2018-01-04: 2 via ORAL
  Filled 2018-01-04: qty 2

## 2018-01-04 MED ORDER — LACTATED RINGERS IV BOLUS
1000.0000 mL | Freq: Once | INTRAVENOUS | Status: AC
  Administered 2018-01-04: 1000 mL via INTRAVENOUS

## 2018-01-04 MED ORDER — OXYCODONE HCL 5 MG PO TABS
5.0000 mg | ORAL_TABLET | Freq: Once | ORAL | Status: AC
  Administered 2018-01-04: 5 mg via ORAL
  Filled 2018-01-04: qty 1

## 2018-01-04 MED ORDER — PROMETHAZINE HCL 25 MG/ML IJ SOLN
25.0000 mg | Freq: Once | INTRAMUSCULAR | Status: AC
Start: 2018-01-04 — End: 2018-01-04
  Administered 2018-01-04: 25 mg via INTRAVENOUS
  Filled 2018-01-04: qty 1

## 2018-01-04 MED ORDER — DEXAMETHASONE SODIUM PHOSPHATE 10 MG/ML IJ SOLN
10.0000 mg | Freq: Once | INTRAMUSCULAR | Status: AC
Start: 2018-01-04 — End: 2018-01-04
  Administered 2018-01-04: 10 mg via INTRAVENOUS
  Filled 2018-01-04: qty 1

## 2018-01-04 MED ORDER — IOPAMIDOL (ISOVUE-370) INJECTION 76%: 100.0000 mL | Freq: Once | INTRAVENOUS | Status: DC | PRN

## 2018-01-04 NOTE — MAU Note (Signed)
Worst headache since Easter, can not get it to go away.  Keeps throwing up, losing weight.  Bad pain in temple, behind left eye- just not letting up.

## 2018-01-04 NOTE — MAU Provider Note (Signed)
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 She had no further workup afterwards till this pregnancy. Eye loss resolved and has not returned.  Pt saw both neurologist and subsequently endocrinologist for persistent HA. She was advised to take a muscle relaxant and or oxycodone but never did ( states was never prescribed); and to follow up postpartum with endocrinologist. Pt is concerned could have an aneurysm. There is no family history of this. She reports intractible nausea and vomiting in past few days. She has not had any in MAU.  Had discussion with pt about multiple scans done this pregnancy, risks of radiation exposure ( >5rads concerning in pregnancy) vs her concerns with trying muscle relaxant and or oxycodone. We also discussed pt history of anxiety and its possible contribution to her symptoms Differential diagnoses for pt at this time include trigeminal neuralgia, and tension HA due to severe anxiety Will plan to get a CT of head with contrast; shield will be provided Will also allow pt to eat Pt agrees to oxycodone - 5mg  IR now

## 2018-01-04 NOTE — MAU Provider Note (Signed)
History     CSN: [id number]  Arrival date and time: 01/04/18 1513   First Provider Initiated Contact with Patient 01/04/18 1702      Chief Complaint  Patient presents with  . Headache  . Emesis   HPI Brandy [name] is a 32 y.o. G2P0010 at [redacted]w[redacted]d who presents for headache. Pt reports persistent headache since Easter. Has been evaluated at the ED, here in MAU, by a neurosurgeon, & by endocrinology. She denies hx of headaches or migraines. Reports daily constant headache that is behind her left eye. Pain is so bad it causes nausea & vomiting. Rates pain 7/10. Nothing makes pain better or worse. Has been taking tylenol and took a few doses of fioricet without relief. Has had occasional blurred vision.  Had MRI w/o contrast at [facility] ED on 12/23/17. Results showed mildly enlarged pituitary gland. She had f/u with Dr. Owens Shark, a neurosurgeon in [facility]. She states he didn't tell her anything other than she needed to f/u with endocrinology & neurology but she hasn't been called for a neurology appt yet. Saw endocrinology on 5/2 with plan for reassessment after delivery.  Reports that these symptoms are affecting her quality of life & she is afraid that she will lose her job. Her mother & spouse are at the bedside. Mother speaks for her a lot. Insistent that cause of pain be diagnosed today and they are specifically requesting a head CT with contrast.   OB History    Gravida  2   Para  0   Term  0   Preterm  0   AB  1   Living  0     SAB  1   TAB  0   Ectopic  0   Multiple  0   Live Births           Obstetric Comments  Age first period 55        Past Medical History:  Diagnosis Date  . Anxiety   . Depression   . Gallstones   . History of nephrolithiasis   . Kidney stone   . Migraine     Past Surgical History:  Procedure Laterality Date  . CHOLECYSTECTOMY  2014  . [place], 2004  . TYMPANOSTOMY TUBE PLACEMENT      Family History   Problem Relation Age of Onset  . Diabetes Mother   . Mental illness Mother   . Asthma Mother   . Stomach cancer Mother   . Colon polyps Mother   . Heart disease Mother   . Hypertension Mother   . Arthritis Mother   . Diabetes Father   . Hypertension Father   . Colon cancer Maternal Grandmother   . Diabetes Paternal Grandmother   . Breast cancer Maternal Aunt   . Lung cancer Maternal Aunt   . Diabetes Paternal Aunt   . Diabetes Maternal Uncle   . Diabetes Maternal Uncle     Social History   Tobacco Use  . Smoking status: Never Smoker  . Smokeless tobacco: Never Used  Substance Use Topics  . Alcohol use: Not Currently    Comment: social-1 every couple of months  . Drug use: No    Allergies:  Allergies  Allergen Reactions  . Penicillins Hives and Other (See Comments)    Childhood reaction Has patient had a PCN reaction causing immediate rash, facial/tongue/throat swelling, SOB or lightheadedness with hypotension: No Has patient had a PCN reaction causing severe rash  involving mucus membranes or skin necrosis: No Has patient had a PCN reaction that required hospitalization No Has patient had a PCN reaction occurring within the last 10 years: No If all of the above answers are "NO", then may proceed with Cephalosporin use.     Medications Prior to Admission  Medication Sig Dispense Refill Last Dose  . acetaminophen (TYLENOL) 500 MG tablet Take 500 mg by mouth every 6 (six) hours as needed.   12/26/2017 at Unknown time  . aspirin 81 MG chewable tablet aspirin 81 mg chewable tablet  Chew 1 tablet every day by oral route.   Past Week at Unknown time  . diphenhydrAMINE (BENADRYL) 25 MG tablet Take 1 tablet (25 mg total) by mouth every 6 (six) hours as needed (headache). 30 tablet 0   . metoCLOPramide (REGLAN) 10 MG tablet Take 1 tablet (10 mg total) by mouth every 6 (six) hours as needed for nausea (headache). 30 tablet 0   . Prenatal Vit-Fe Fumarate-FA (PRENATAL  MULTIVITAMIN) TABS tablet Take 1 tablet by mouth daily at 12 noon.   12/26/2017 at Unknown time  . ranitidine (ZANTAC) 150 MG capsule Take 150 mg by mouth daily.   12/26/2017 at Unknown time    Review of Systems  Constitutional: Negative.  Negative for chills and fever.  Eyes: Positive for pain and visual disturbance.  Gastrointestinal: Positive for nausea and vomiting (reports vomiting & dryheaving d/t the pain). Negative for abdominal pain.  Genitourinary: Negative.   Neurological: Positive for headaches. Negative for syncope, facial asymmetry and speech difficulty.   Physical Exam   Blood pressure 113/83, pulse (!) 105, temperature 98.5 F (36.9 C), temperature source Oral, resp. rate 18, weight 219 lb 4 oz (99.5 kg), last menstrual period 05/13/2017, SpO2 100 %.  Physical Exam  Nursing note and vitals reviewed. Constitutional: She is oriented to person, place, and time. She appears well-developed and well-nourished. No distress.  HENT:  Head: Normocephalic and atraumatic.  Eyes: Pupils are equal, round, and reactive to light. Conjunctivae and EOM are normal. Right eye exhibits no discharge. Left eye exhibits no discharge. No scleral icterus.  Neck: Normal range of motion. Neck supple. No JVD present.  Cardiovascular: Normal rate, regular rhythm and normal heart sounds.  No murmur heard. Respiratory: Effort normal and breath sounds normal. No respiratory distress. She has no wheezes.  Neurological: She is alert and oriented to person, place, and time. No cranial nerve deficit.  Skin: Skin is warm and dry. She is not diaphoretic.  Psychiatric: She has a normal mood and affect. Her behavior is normal. Judgment and thought content normal.    MAU Course  Procedures Results for orders placed or performed during the hospital encounter of 01/04/18 (from the past 24 hour(s))  Urinalysis, Routine w reflex microscopic     Status: Abnormal   Collection Time: 01/04/18  3:44 PM  Result Value  Ref Range   Color, Urine YELLOW YELLOW   APPearance CLOUDY (A) CLEAR   Specific Gravity, Urine 1.013 1.005 - 1.030   pH 7.0 5.0 - 8.0   Glucose, UA NEGATIVE NEGATIVE mg/dL   Hgb urine dipstick NEGATIVE NEGATIVE   Bilirubin Urine NEGATIVE NEGATIVE   Ketones, ur NEGATIVE NEGATIVE mg/dL   Protein, ur NEGATIVE NEGATIVE mg/dL   Nitrite NEGATIVE NEGATIVE   Leukocytes, UA MODERATE (A) NEGATIVE   RBC / HPF 0-5 0 - 5 RBC/hpf   WBC, UA 11-20 0 - 5 WBC/hpf   Bacteria, UA MANY (A) NONE SEEN  Squamous Epithelial / LPF 0-5 0 - 5   Mucus PRESENT    Amorphous Crystal PRESENT     MDM NST:  Baseline: 145 bpm, Variability: Good {> 6 bpm), Accelerations: Reactive and Decelerations: Absent Reactive NST  VSS, pt normotensive. Had negative Prex labs on 4/28  On 4/28, pt was given IV headache cocktail while in MAU; reports that it helped with headache initially. Offered pt headache cocktail today while waiting for Dr. Terri Piedra; she is agreeable with plan but prefers phenergan in cocktail vs reglan.   Discussed patient presentation & concerns with Dr. Terri Piedra. She will come speak with patient and family regarding plan of care.   Assessment and Plan  A: 1. Headache above the eye region   2. [redacted] weeks gestation of pregnancy    P: Dr. Terri Piedra discussed options for imaging with patient and family. CTA of head ordered.  Jorje Guild 01/04/2018, 5:02 PM

## 2018-01-05 ENCOUNTER — Ambulatory Visit (HOSPITAL_COMMUNITY)
Admission: RE | Admit: 2018-01-05 | Discharge: 2018-01-05 | Disposition: A | Discharge status: Home / Self Care | Payer: 59 | Source: Ambulatory Visit | Attending: Obstetrics and Gynecology | Admitting: Obstetrics and Gynecology

## 2018-01-05 ENCOUNTER — Other Ambulatory Visit (HOSPITAL_COMMUNITY): Payer: Self-pay | Admitting: Obstetrics and Gynecology

## 2018-01-05 ENCOUNTER — Other Ambulatory Visit: Payer: Self-pay | Admitting: Obstetrics and Gynecology

## 2018-01-05 DIAGNOSIS — Z3A33 33 weeks gestation of pregnancy: Secondary | ICD-10-CM | POA: Insufficient documentation

## 2018-01-05 DIAGNOSIS — G43911 Migraine, unspecified, intractable, with status migrainosus: Secondary | ICD-10-CM

## 2018-01-05 DIAGNOSIS — R51 Headache: Secondary | ICD-10-CM | POA: Insufficient documentation

## 2018-01-05 DIAGNOSIS — O26893 Other specified pregnancy related conditions, third trimester: Secondary | ICD-10-CM | POA: Insufficient documentation

## 2018-01-05 MED ORDER — OXYCODONE HCL 5 MG PO TABS: 5.0000 mg | ORAL_TABLET | ORAL | 0 refills | Status: AC | PRN

## 2018-01-05 MED ORDER — OXYCODONE HCL 5 MG PO CAPS: 5.0000 mg | ORAL_CAPSULE | Freq: Four times a day (QID) | ORAL | 0 refills | Status: AC | PRN

## 2018-01-05 NOTE — Discharge Instructions (Signed)
If symptoms worsen [facility]

## 2018-01-05 NOTE — MAU Provider Note (Signed)
Late entry: Pt went for CTA and tech realized equipment not able to perform scan Informed pt needs to be seen at [place] for CTA.  Offered transfer to [facility] via [facility], vs pt to go directly to [facility] with family vs outpt CTA tomorrow.  Pt had extensive discussion with family and finally opted to have done outpt.  Pt reported oxycodone had only decreased HA slightly - from a 7 to a 6. She had been able to tolerate dinner and still no nausea while in MAU Will set up outpt CTA for pt in am through office Pt given a dose of 10mg  oxycodone prior to discharge and rx called in for her as well F/u as scheduled

## 2018-01-13 LAB — OB RESULTS CONSOLE GBS: STREP GROUP B AG: NEGATIVE

## 2018-01-17 ENCOUNTER — Telehealth: Payer: Self-pay | Admitting: Family Medicine

## 2018-01-17 NOTE — Telephone Encounter (Signed)
Copied from [place] [id number]. Topic: Quick Communication - Rx Refill/Question >> Jan 17, 2018  1:02 PM Oliver Pila B wrote: Medication: abx  Pt states that she saw Volanda Napoleon a couple of weeks ago for allergies, pt thinks she has sinus infection and is needing medication, call pt to advise

## 2018-01-17 NOTE — Telephone Encounter (Signed)
Copied from [place] [id number]. Topic: Quick Communication - Rx Refill/Question >> Jan 17, 2018  1:02 PM Oliver Pila B wrote: Medication: abx  Pt states that she saw Volanda Napoleon a couple of weeks ago for allergies, pt thinks she has sinus infection and is needing medication, call pt to advise  >> Jan 17, 2018  3:28 PM Boyd Kerbs wrote: Pt goes to  [place] [place] [facility] [address]  [place] [address]  Phone: [phone] Fax: [phone]

## 2018-01-18 ENCOUNTER — Ambulatory Visit (INDEPENDENT_AMBULATORY_CARE_PROVIDER_SITE_OTHER): Payer: 59 | Admitting: Family Medicine

## 2018-01-18 ENCOUNTER — Encounter: Payer: Self-pay | Admitting: Family Medicine

## 2018-01-18 VITALS — BP 110/80 | HR 98 | Temp 98.2°F | Wt 227.0 lb

## 2018-01-18 DIAGNOSIS — J019 Acute sinusitis, unspecified: Secondary | ICD-10-CM | POA: Diagnosis not present

## 2018-01-18 MED ORDER — AZITHROMYCIN 250 MG PO TABS: ORAL_TABLET | ORAL | 0 refills | Status: DC

## 2018-01-18 NOTE — Telephone Encounter (Signed)
Attempted to call patient- advised to keep appointment with Dr Volanda Napoleon to evaluate symptoms for possible treatment.

## 2018-01-18 NOTE — Progress Notes (Signed)
Subjective:    Patient ID: Brandy [name], female    DOB: 07-19-1986, 32 y.o.   MRN: [id number]  No chief complaint on file. Patient is accompanied by her husband.  HPI Pt a G2P0010 at [redacted]w[redacted]d, was seen today for headaches, sinus pressure/pain, sore throat (worse in the morning), rhinorrhea, productive cough.  Pt denies N/V. Pt was last seen on 12/22/2017 for allergy symptoms.  They have since become worse.  Pt has continued taking Claritin with little to no relief.  Pt has been taking Tylenol for her HAs, which are so severe at times they cause her to cry.  Past Medical History:  Diagnosis Date  . Anxiety   . Depression   . Gallstones   . History of nephrolithiasis   . Kidney stone   . Migraine     Allergies  Allergen Reactions  . Penicillins Hives and Other (See Comments)    Childhood reaction Has patient had a PCN reaction causing immediate rash, facial/tongue/throat swelling, SOB or lightheadedness with hypotension: No Has patient had a PCN reaction causing severe rash involving mucus membranes or skin necrosis: No Has patient had a PCN reaction that required hospitalization No Has patient had a PCN reaction occurring within the last 10 years: No If all of the above answers are "NO", then may proceed with Cephalosporin use.     ROS General: Denies fever, chills, night sweats, changes in weight, changes in appetite HEENT: Denies changes in vision  +HA, R ear pain, rhinorrhea, sore throat, sinus pain/pressure CV: Denies CP, palpitations, SOB, orthopnea Pulm: Denies SOB, wheezing  +cough-productive GI: Denies abdominal pain, nausea, vomiting, diarrhea, constipation  +gravid GU: Denies dysuria, hematuria, frequency, vaginal discharge Msk: Denies muscle cramps, joint pains Neuro: Denies weakness, numbness, tingling Skin: Denies rashes, bruising Psych: Denies depression, anxiety, hallucinations     Objective:    Blood pressure 110/80, pulse 98, temperature 98.2 F (36.8 C),  temperature source Oral, weight 227 lb (103 kg), last menstrual period 05/13/2017, SpO2 98 %.   Gen. Pleasant, well-nourished, in no distress, normal affect   HEENT: [place]/AT, face symmetric,  no scleral icterus, PERRLA, nares patent with mild clear drainage, pharynx without erythema or exudate.  TTP of frontal and maxillary sinuses.  TMs full b/l, no cervical lymphadenopathy. Lungs: no accessory muscle use, CTAB, no wheezes or rales Cardiovascular: RRR, no m/r/g, no peripheral edema Abdomen: Gravid, BS present, soft, NT/N, no hepatosplenomegaly. Neuro:  A&Ox3, CN II-XII intact, normal gait  Wt Readings from Last 3 Encounters:  01/18/18 227 lb (103 kg)  01/04/18 219 lb 4 oz (99.5 kg)  12/22/17 221 lb (100.2 kg)    Lab Results  Component Value Date   WBC 10.5 12/26/2017   HGB 12.2 12/26/2017   HCT 36.0 12/26/2017   PLT 267 12/26/2017   GLUCOSE 102 (H) 12/26/2017   CHOL 141 09/24/2014   TRIG 48.0 09/24/2014   HDL 49.00 09/24/2014   LDLCALC 82 09/24/2014   ALT 17 12/26/2017   AST 16 12/26/2017   NA 137 12/26/2017   K 3.7 12/26/2017   CL 104 12/26/2017   CREATININE 0.69 12/26/2017   BUN 8 12/26/2017   CO2 22 12/26/2017   TSH 2.26 05/04/2017   HGBA1C 5.3 06/09/2016    Assessment/Plan:  Subacute sinusitis, unspecified location  -Discussed supportive care including continuing to use Claritin, saline nasal spray. -Given duration of symptoms and facial pressure will give Rx for azithromycin -Given handout - Plan: azithromycin (ZITHROMAX) 250 MG tablet -Follow-up PRN  Shannon Banks, MD 

## 2018-01-18 NOTE — Telephone Encounter (Signed)
Pt called and states that she has contacted her OB and they advised her to contact her PCP. She still has the same symptoms as OV on 12/22/17. She states that Dr. Volanda Napoleon advised her if she was not improving medication could be sent in for her. She states that she cannot miss any more work so is unable to come in.   Dr. Volanda Napoleon - Please advise. Thanks!

## 2018-01-18 NOTE — Telephone Encounter (Signed)
Pt says that she will go ahead and schedule apt with provider due to message and explanation as to why she need the apt. Pt is scheduled with Dr. Volanda Napoleon for today at 4:30p.

## 2018-01-18 NOTE — Telephone Encounter (Signed)
Called patient and left message to return call. Patient needs office visit for evaluation as last appointment for allergies was almost a month ago.  Given that she is 34+ weeks pregnant, she may wish to make this appointment w/ her OB.

## 2018-01-18 NOTE — Patient Instructions (Signed)

## 2018-01-26 ENCOUNTER — Other Ambulatory Visit: Payer: Self-pay | Admitting: Family Medicine

## 2018-01-26 NOTE — Telephone Encounter (Signed)
Dr. Banks - Please advise. Thanks! 

## 2018-01-26 NOTE — Telephone Encounter (Signed)
Please advise. PCP is out of the office today.

## 2018-01-26 NOTE — Telephone Encounter (Signed)
Patient is calling to follow up on this. She would like a call back today on what can be done.

## 2018-01-26 NOTE — Telephone Encounter (Signed)
Copied from [place] [id number]. Topic: General - Other >> Jan 25, 2018  5:03 PM Yvette Rack wrote: Reason for CRM: Pt called in stating she completed the Rx two days ago 01/23/18 but she is not feeling any better. Pt request that another Rx be called in or she be contacted to be advised of what she should do. Pt request a return call. Cb# [phone].

## 2018-01-27 NOTE — Telephone Encounter (Signed)
ATC pt but vm is full, unable to leave message  Per Dr Martinique - pt does would likely not benefit from additional abx since they did not help with symptoms, she can continue nasal saline sprays as needed

## 2018-01-27 NOTE — Telephone Encounter (Signed)
Seen 09/27/17 for acute visit. 5 months pregnant at the time of the visit. No many options for allergic rhinitis treatment. It seems like steroid nasal spray did not help, so I do not recommend resuming it. Continue saline nasal irrigations as needed.  Brandy Higinbotham Martinique, MD

## 2018-01-28 NOTE — Telephone Encounter (Signed)
I spoke with pt and gave below advice.

## 2018-01-31 ENCOUNTER — Encounter (HOSPITAL_COMMUNITY): Payer: Self-pay

## 2018-01-31 ENCOUNTER — Encounter (HOSPITAL_COMMUNITY): Payer: Self-pay | Admitting: *Deleted

## 2018-01-31 ENCOUNTER — Inpatient Hospital Stay (HOSPITAL_COMMUNITY)
Admission: AD | Admit: 2018-01-31 | Discharge: 2018-02-04 | DRG: 807 | Disposition: A | Discharge status: Home / Self Care | Payer: 59 | Source: Ambulatory Visit | Attending: Obstetrics and Gynecology | Admitting: Obstetrics and Gynecology

## 2018-01-31 DIAGNOSIS — Z880 Allergy status to penicillin: Secondary | ICD-10-CM

## 2018-01-31 DIAGNOSIS — Z87442 Personal history of urinary calculi: Secondary | ICD-10-CM | POA: Diagnosis not present

## 2018-01-31 DIAGNOSIS — Z3A37 37 weeks gestation of pregnancy: Secondary | ICD-10-CM | POA: Diagnosis not present

## 2018-01-31 DIAGNOSIS — D352 Benign neoplasm of pituitary gland: Secondary | ICD-10-CM | POA: Diagnosis present

## 2018-01-31 DIAGNOSIS — O26893 Other specified pregnancy related conditions, third trimester: Principal | ICD-10-CM | POA: Diagnosis present

## 2018-01-31 DIAGNOSIS — R519 Headache, unspecified: Secondary | ICD-10-CM | POA: Diagnosis present

## 2018-01-31 DIAGNOSIS — F419 Anxiety disorder, unspecified: Secondary | ICD-10-CM | POA: Diagnosis present

## 2018-01-31 DIAGNOSIS — O99344 Other mental disorders complicating childbirth: Secondary | ICD-10-CM | POA: Diagnosis present

## 2018-01-31 DIAGNOSIS — R51 Headache: Secondary | ICD-10-CM | POA: Diagnosis present

## 2018-01-31 HISTORY — DX: Neoplasm of unspecified behavior of endocrine glands and other parts of nervous system: D49.7

## 2018-01-31 LAB — COMPREHENSIVE METABOLIC PANEL
ALBUMIN: 3 g/dL — AB (ref 3.5–5.0)
ALT: 13 U/L — AB (ref 14–54)
AST: 13 U/L — AB (ref 15–41)
Alkaline Phosphatase: 91 U/L (ref 38–126)
Anion gap: 10 (ref 5–15)
BILIRUBIN TOTAL: 0.4 mg/dL (ref 0.3–1.2)
BUN: 7 mg/dL (ref 6–20)
CHLORIDE: 105 mmol/L (ref 101–111)
CO2: 20 mmol/L — ABNORMAL LOW (ref 22–32)
CREATININE: 0.8 mg/dL (ref 0.44–1.00)
Calcium: 8.8 mg/dL — ABNORMAL LOW (ref 8.9–10.3)
GFR calc Af Amer: >60 mL/min (ref >60)
Glucose, Bld: 118 mg/dL — ABNORMAL HIGH (ref 65–99)
Potassium: 3.5 mmol/L (ref 3.5–5.1)
Sodium: 135 mmol/L (ref 135–145)
TOTAL PROTEIN: 6.7 g/dL (ref 6.5–8.1)

## 2018-01-31 LAB — URINALYSIS, ROUTINE W REFLEX MICROSCOPIC
Bilirubin Urine: NEGATIVE
Glucose, UA: NEGATIVE mg/dL
Hgb urine dipstick: NEGATIVE
Ketones, ur: 80 mg/dL — AB
Leukocytes, UA: NEGATIVE
NITRITE: NEGATIVE
Protein, ur: 30 mg/dL — AB
SPECIFIC GRAVITY, URINE: 1.019 (ref 1.005–1.030)
pH: 6 (ref 5.0–8.0)

## 2018-01-31 LAB — PROTEIN / CREATININE RATIO, URINE
CREATININE, URINE: 210 mg/dL
Protein Creatinine Ratio: 0.17 mg/mg{Cre} — ABNORMAL HIGH (ref 0.00–0.15)
Total Protein, Urine: 35 mg/dL

## 2018-01-31 LAB — TYPE AND SCREEN
ABO/RH(D): A POS
ANTIBODY SCREEN: NEGATIVE

## 2018-01-31 LAB — CBC
HCT: 37.4 % (ref 36.0–46.0)
Hemoglobin: 12.2 g/dL (ref 12.0–15.0)
MCH: 29.8 pg (ref 26.0–34.0)
MCHC: 32.6 g/dL (ref 30.0–36.0)
MCV: 91.4 fL (ref 78.0–100.0)
Platelets: 271 10*3/uL (ref 150–400)
RBC: 4.09 MIL/uL (ref 3.87–5.11)
RDW: 13.6 % (ref 11.5–15.5)
WBC: 7.6 10*3/uL (ref 4.0–10.5)

## 2018-01-31 MED ORDER — OXYTOCIN 40 UNITS IN LACTATED RINGERS INFUSION - SIMPLE MED: 2.5000 [IU]/h | INTRAVENOUS | Status: DC

## 2018-01-31 MED ORDER — OXYCODONE-ACETAMINOPHEN 5-325 MG PO TABS: 2.0000 | ORAL_TABLET | ORAL | Status: DC | PRN

## 2018-01-31 MED ORDER — OXYCODONE-ACETAMINOPHEN 5-325 MG PO TABS
1.0000 | ORAL_TABLET | ORAL | Status: DC | PRN
  Administered 2018-01-31: 1 via ORAL
  Filled 2018-01-31: qty 1

## 2018-01-31 MED ORDER — SOD CITRATE-CITRIC ACID 500-334 MG/5ML PO SOLN
30.0000 mL | ORAL | Status: DC | PRN
  Administered 2018-01-31: 30 mL via ORAL
  Filled 2018-01-31: qty 15

## 2018-01-31 MED ORDER — ACETAMINOPHEN 325 MG PO TABS
650.0000 mg | ORAL_TABLET | ORAL | Status: DC | PRN
  Administered 2018-01-31 – 2018-02-01 (×2): 650 mg via ORAL
  Filled 2018-01-31 (×2): qty 2

## 2018-01-31 MED ORDER — LACTATED RINGERS IV SOLN: 500.0000 mL | INTRAVENOUS | Status: DC | PRN

## 2018-01-31 MED ORDER — TERBUTALINE SULFATE 1 MG/ML IJ SOLN: 0.2500 mg | Freq: Once | INTRAMUSCULAR | Status: DC | PRN

## 2018-01-31 MED ORDER — BUTORPHANOL TARTRATE 1 MG/ML IJ SOLN
1.0000 mg | INTRAMUSCULAR | Status: DC | PRN
  Administered 2018-01-31: 1 mg via INTRAVENOUS
  Filled 2018-01-31: qty 1

## 2018-01-31 MED ORDER — LIDOCAINE HCL (PF) 1 % IJ SOLN
30.0000 mL | INTRAMUSCULAR | Status: DC | PRN
  Filled 2018-01-31: qty 30

## 2018-01-31 MED ORDER — MISOPROSTOL 25 MCG QUARTER TABLET
25.0000 ug | ORAL_TABLET | ORAL | Status: DC | PRN
  Administered 2018-01-31 – 2018-02-01 (×3): 25 ug via VAGINAL
  Filled 2018-01-31 (×3): qty 1

## 2018-01-31 MED ORDER — LACTATED RINGERS IV SOLN
INTRAVENOUS | Status: DC
  Administered 2018-01-31 – 2018-02-02 (×4): via INTRAVENOUS

## 2018-01-31 MED ORDER — OXYTOCIN BOLUS FROM INFUSION
500.0000 mL | Freq: Once | INTRAVENOUS | Status: AC
  Administered 2018-02-02: 500 mL via INTRAVENOUS

## 2018-01-31 MED ORDER — ONDANSETRON HCL 4 MG/2ML IJ SOLN
4.0000 mg | Freq: Four times a day (QID) | INTRAMUSCULAR | Status: DC | PRN
  Administered 2018-02-01 (×2): 4 mg via INTRAVENOUS
  Filled 2018-01-31 (×2): qty 2

## 2018-01-31 NOTE — MAU Provider Note (Signed)
Patient Margaretha Mahan is a 32 y.o. G2P0010 At [redacted]w[redacted]d here with complaints of losing her side vision and terrible headache. She denies contractions, leaking of fluid or decreased fetal movements. Her history is significant for pituitary adenoma; she has seen multiple specialists for this and is under the care of endocrinologist, neurologist and has seen a Chief of Staff. She is very upset and anxious about losing her vision and wants to be admitted for induction.   History     CSN: [id number]  Arrival date and time: 01/31/18 1654   First Provider Initiated Contact with Patient 01/31/18 1714      Chief Complaint  Patient presents with  . Headache  . visual changes  . Nausea   HPI Patient has a strong headache and now with vision changes. This has been going on for two days; she has tried phenergan, oxycodone and it has not helped. She denies blurry vision, SOB, sudden swelling.  OB History    Gravida  2   Para  0   Term  0   Preterm  0   AB  1   Living  0     SAB  1   TAB  0   Ectopic  0   Multiple  0   Live Births           Obstetric Comments  Age first period 37        Past Medical History:  Diagnosis Date  . Anxiety   . Depression   . Gallstones   . History of nephrolithiasis   . Kidney stone   . Migraine   . Pituitary tumor     Past Surgical History:  Procedure Laterality Date  . CHOLECYSTECTOMY  2014  . [place], 2004  . TYMPANOSTOMY TUBE PLACEMENT      Family History  Problem Relation Age of Onset  . Diabetes Mother   . Mental illness Mother   . Asthma Mother   . Stomach cancer Mother   . Colon polyps Mother   . Heart disease Mother   . Hypertension Mother   . Arthritis Mother   . Diabetes Father   . Hypertension Father   . Colon cancer Maternal Grandmother   . Diabetes Paternal Grandmother   . Breast cancer Maternal Aunt   . Lung cancer Maternal Aunt   . Diabetes Paternal Aunt   . Diabetes Maternal Uncle   .  Diabetes Maternal Uncle     Social History   Tobacco Use  . Smoking status: Never Smoker  . Smokeless tobacco: Never Used  Substance Use Topics  . Alcohol use: Not Currently    Comment: social-1 every couple of months  . Drug use: No    Allergies:  Allergies  Allergen Reactions  . Penicillins Hives and Other (See Comments)    Childhood reaction Has patient had a PCN reaction causing immediate rash, facial/tongue/throat swelling, SOB or lightheadedness with hypotension: No Has patient had a PCN reaction causing severe rash involving mucus membranes or skin necrosis: No Has patient had a PCN reaction that required hospitalization No Has patient had a PCN reaction occurring within the last 10 years: No If all of the above answers are "NO", then may proceed with Cephalosporin use.     Medications Prior to Admission  Medication Sig Dispense Refill Last Dose  . acetaminophen (TYLENOL) 500 MG tablet Take 500 mg by mouth every 6 (six) hours as needed.   Taking  . aspirin  81 MG chewable tablet aspirin 81 mg chewable tablet  Chew 1 tablet every day by oral route.   Not Taking  . azithromycin (ZITHROMAX) 250 MG tablet Take 2 pills on day 1.  Then take 1 pill daily on days 2 through 5. 6 tablet 0   . diphenhydrAMINE (BENADRYL) 25 MG tablet Take 1 tablet (25 mg total) by mouth every 6 (six) hours as needed (headache). 30 tablet 0 Taking  . metoCLOPramide (REGLAN) 10 MG tablet Take 1 tablet (10 mg total) by mouth every 6 (six) hours as needed for nausea (headache). (Patient not taking: Reported on 01/18/2018) 30 tablet 0 Not Taking  . Prenatal Vit-Fe Fumarate-FA (PRENATAL MULTIVITAMIN) TABS tablet Take 1 tablet by mouth daily at 12 noon.   Taking  . ranitidine (ZANTAC) 150 MG capsule Take 150 mg by mouth daily.   Taking    Review of Systems  Constitutional: Negative.   HENT: Negative.   Respiratory: Negative.   Cardiovascular: Negative.   Gastrointestinal: Negative.   Genitourinary:  Negative.   Musculoskeletal: Negative.   Neurological: Positive for headaches.  Hematological: Negative.   Psychiatric/Behavioral: Negative.    Physical Exam   Blood pressure 134/87, pulse (!) 105, temperature 98 F (36.7 C), temperature source Oral, resp. rate 18, height 5' 8.5" (1.74 m), weight 220 lb (99.8 kg), last menstrual period 05/13/2017.  Physical Exam  Constitutional: She appears well-developed and well-nourished.  HENT:  Head: Normocephalic.  Neck: Normal range of motion.  GI: Soft. Bowel sounds are normal.  Genitourinary:  Genitourinary Comments: Normal external female genitalia; cervix is long, closed and thick.   Musculoskeletal: Normal range of motion.  Neurological: She is alert.  Skin: Skin is warm and dry.    MAU Course  Procedures  MDM -NST: 140 bpm, mod var, present acel, neg decels, no uterine contractions.  Patient has refused fioricet, oxycodone, phernergan for her HA. Strongly desires induction.  -pre-e labs drawn.  Assessment and Plan   1. Discussed with Dr. Willis Modena, patient to be admitted and induced with cytotec.   Mervyn Skeeters Kooistra 01/31/2018, 6:49 PM

## 2018-01-31 NOTE — MAU Provider Note (Deleted)
Patient Brandy [name] is a 32 y.o. G2P0010 At [redacted]w[redacted]d here with complaints of losing her side vision and terrible headache. She denies contractions, leaking of fluid or decreased fetal movements. Her history is significant for pituitary adenoma; she has seen multiple specialists for this and is under the care of endocrinologist, neurologist and has seen a Chief of Staff. She is very upset and anxious about losing her vision and wants to stay and be induced.  History     CSN: [id number]  Arrival date and time: 01/31/18 1654   First Provider Initiated Contact with Patient 01/31/18 1714      Chief Complaint  Patient presents with  . Headache  . visual changes  . Nausea   HPI  OB History    Gravida  2   Para  0   Term  0   Preterm  0   AB  1   Living  0     SAB  1   TAB  0   Ectopic  0   Multiple  0   Live Births           Obstetric Comments  Age first period 54        Past Medical History:  Diagnosis Date  . Anxiety   . Depression   . Gallstones   . History of nephrolithiasis   . Kidney stone   . Migraine   . Pituitary tumor     Past Surgical History:  Procedure Laterality Date  . CHOLECYSTECTOMY  2014  . [place], 2004  . TYMPANOSTOMY TUBE PLACEMENT      Family History  Problem Relation Age of Onset  . Diabetes Mother   . Mental illness Mother   . Asthma Mother   . Stomach cancer Mother   . Colon polyps Mother   . Heart disease Mother   . Hypertension Mother   . Arthritis Mother   . Diabetes Father   . Hypertension Father   . Colon cancer Maternal Grandmother   . Diabetes Paternal Grandmother   . Breast cancer Maternal Aunt   . Lung cancer Maternal Aunt   . Diabetes Paternal Aunt   . Diabetes Maternal Uncle   . Diabetes Maternal Uncle     Social History   Tobacco Use  . Smoking status: Never Smoker  . Smokeless tobacco: Never Used  Substance Use Topics  . Alcohol use: Not Currently    Comment: social-1 every  couple of months  . Drug use: No    Allergies:  Allergies  Allergen Reactions  . Penicillins Hives and Other (See Comments)    Childhood reaction Has patient had a PCN reaction causing immediate rash, facial/tongue/throat swelling, SOB or lightheadedness with hypotension: No Has patient had a PCN reaction causing severe rash involving mucus membranes or skin necrosis: No Has patient had a PCN reaction that required hospitalization No Has patient had a PCN reaction occurring within the last 10 years: No If all of the above answers are "NO", then may proceed with Cephalosporin use.     Medications Prior to Admission  Medication Sig Dispense Refill Last Dose  . acetaminophen (TYLENOL) 500 MG tablet Take 500 mg by mouth every 6 (six) hours as needed.   Taking  . aspirin 81 MG chewable tablet aspirin 81 mg chewable tablet  Chew 1 tablet every day by oral route.   Not Taking  . azithromycin (ZITHROMAX) 250 MG tablet Take 2 pills on day 1.  Then take 1 pill daily on days 2 through 5. 6 tablet 0   . diphenhydrAMINE (BENADRYL) 25 MG tablet Take 1 tablet (25 mg total) by mouth every 6 (six) hours as needed (headache). 30 tablet 0 Taking  . metoCLOPramide (REGLAN) 10 MG tablet Take 1 tablet (10 mg total) by mouth every 6 (six) hours as needed for nausea (headache). (Patient not taking: Reported on 01/18/2018) 30 tablet 0 Not Taking  . Prenatal Vit-Fe Fumarate-FA (PRENATAL MULTIVITAMIN) TABS tablet Take 1 tablet by mouth daily at 12 noon.   Taking  . ranitidine (ZANTAC) 150 MG capsule Take 150 mg by mouth daily.   Taking    Review of Systems  Constitutional: Negative.   HENT: Negative.   Eyes: Positive for visual disturbance.  Cardiovascular: Negative.   Gastrointestinal: Negative.   Genitourinary: Negative.   Musculoskeletal: Negative.   Neurological: Positive for headaches.   Physical Exam   Blood pressure 134/87, pulse (!) 105, temperature 98 F (36.7 C), temperature source Oral,  resp. rate 18, height 5' 8.5" (1.74 m), weight 220 lb (99.8 kg), last menstrual period 05/13/2017.  Physical Exam  Constitutional: She is oriented to person, place, and time. She appears well-developed.  HENT:  Head: Normocephalic.  Neck: Normal range of motion.  Respiratory: Effort normal.  GI: Soft.  Genitourinary:  Genitourinary Comments: Normal external female genitalia; cervix is long, closed and thick.   Musculoskeletal: Normal range of motion.  Neurological: She is alert and oriented to person, place, and time.  Skin: Skin is warm and dry.  Psychiatric: She has a normal mood and affect.    MAU Course  Procedures  MDM -NST: 140 bpm, mod var, present acel, neg decels, no contractions.  -Bps slightly elevated above patient's normal range; pre-e labs pending.  -patient declines fioricet, narcotics and HA cocktail while in MAU.   Assessment and Plan   1. Discussed case with Dr. Willis Modena, who recommends admission for induction. Dr. Willis Modena will place orders.   Mervyn Skeeters Kooistra 01/31/2018, 6:36 PM

## 2018-01-31 NOTE — MAU Note (Signed)
Pt has pituitary adenoma, has severe HA's, feels like she is losing her peripheral vision, vomiting - has been happening since April except for the visual changes.  Has been on phenergan & oxycodone with no relief.  Denies contractions, bleeding or LOF.  Reports good fetal movement.  Denies elevated BP.

## 2018-01-31 NOTE — H&P (Signed)
Brandy [name] is a 32 y.o. female, G2 P73, EGA [phone] with EDC 6-20 presenting for persistent severe headache and saying she is losing her peripheral vision.  Prenatal care complicated by headaches, has a pituitary microadenoma that has been evaluated by endocrinology and neurology-they are not sure her symptoms are from this adenoma as it is not large and as far as we know is not rapidly growing.  However, the patient has been c/o severe headaches, minimal relief, if any, from meds including oxycodone.  Her headache is unrelenting and this is making her pre-existing anxiety worse.  She has also recently passed a kidney stone.  OB History    Gravida  2   Para  0   Term  0   Preterm  0   AB  1   Living  0     SAB  1   TAB  0   Ectopic  0   Multiple  0   Live Births           Obstetric Comments  Age first period 34       Past Medical History:  Diagnosis Date  . Anxiety   . Depression   . Gallstones   . History of nephrolithiasis   . Kidney stone   . Migraine   . Pituitary tumor    Past Surgical History:  Procedure Laterality Date  . CHOLECYSTECTOMY  2014  . [place], 2004  . TYMPANOSTOMY TUBE PLACEMENT     Family History: family history includes Arthritis in her mother; Asthma in her mother; Breast cancer in her maternal aunt; Colon cancer in her maternal grandmother; Colon polyps in her mother; Diabetes in her father, maternal uncle, maternal uncle, mother, paternal aunt, and paternal grandmother; Heart disease in her mother; Hypertension in her father and mother; Lung cancer in her maternal aunt; Mental illness in her mother; Stomach cancer in her mother. Social History:  reports that she has never smoked. She has never used smokeless tobacco. She reports that she drank alcohol. She reports that she does not use drugs.     Maternal Diabetes: No Genetic Screening: Normal Maternal Ultrasounds/Referrals: Normal Fetal Ultrasounds or other  Referrals:  None Maternal Substance Abuse:  No Significant Maternal Medications:  None Significant Maternal Lab Results:  Lab values include: Group B Strep negative Other Comments:  None  Review of Systems  Respiratory: Negative.   Cardiovascular: Negative.    Maternal Medical History:  Fetal activity: Perceived fetal activity is normal.      Dilation: Closed Effacement (%): Thick Exam by:: K. Kooistra, CNM Blood pressure 123/75, pulse (!) 125, temperature 98.3 F (36.8 C), temperature source Oral, resp. rate 16, height 5' 8.5" (1.74 m), weight 99.8 kg (220 lb), last menstrual period 05/13/2017. Maternal Exam:  Uterine Assessment: Contraction frequency is rare.   Abdomen: Patient reports no abdominal tenderness. Introitus: Amniotic fluid character: not assessed.  Pelvis: adequate for delivery.   Cervix: Cervix evaluated by digital exam.     Fetal Exam Fetal Monitor Review: Mode: ultrasound.   Baseline rate: 140-150.  Variability: moderate (6-25 bpm).   Pattern: accelerations present and no decelerations.    Fetal State Assessment: Category I - tracings are normal.     Physical Exam  Vitals reviewed. Constitutional: She appears well-developed and well-nourished.  Cardiovascular: Normal rate and regular rhythm.  Respiratory: Effort normal. No respiratory distress.  GI: Soft.    Prenatal labs: ABO, Rh: A/Positive/-- (11/15 0000) Antibody: Negative (11/15  0000) Rubella: Immune (11/15 0000) RPR: Nonreactive (11/15 0000)  HBsAg: Negative (11/15 0000)  HIV: Non-reactive (11/15 0000)  GBS: Negative (05/16 0000)   Assessment/Plan: IUP at 37+ weeks, has a small pituitary adenoma but is having intractable headaches and feels like she is losing her peripheral vision.  No evidence of PIH, nl BP, nl labs.  I find it difficult to see how she can continue this pregnancy with her intractable headaches that have not responded to meds including oxycodone.  Cervix is currently  unfavorable.  Will admit for cervical ripening and induction, treat headache prn.  She will need further evaluation of her pituitary adenoma postpartum.   Blane Ohara Brandy [name] 01/31/2018, 9:15 PM

## 2018-02-01 ENCOUNTER — Inpatient Hospital Stay (HOSPITAL_COMMUNITY): Payer: 59 | Admitting: Anesthesiology

## 2018-02-01 LAB — RPR: RPR Ser Ql: NONREACTIVE

## 2018-02-01 MED ORDER — ACETAMINOPHEN 325 MG PO TABS
650.0000 mg | ORAL_TABLET | Freq: Four times a day (QID) | ORAL | Status: DC | PRN
  Administered 2018-02-01 – 2018-02-02 (×2): 650 mg via ORAL
  Filled 2018-02-01 (×2): qty 2

## 2018-02-01 MED ORDER — EPHEDRINE 5 MG/ML INJ: 10.0000 mg | INTRAVENOUS | Status: DC | PRN

## 2018-02-01 MED ORDER — DIPHENHYDRAMINE HCL 50 MG/ML IJ SOLN: 12.5000 mg | INTRAMUSCULAR | Status: DC | PRN

## 2018-02-01 MED ORDER — OXYTOCIN 40 UNITS IN LACTATED RINGERS INFUSION - SIMPLE MED
1.0000 m[IU]/min | INTRAVENOUS | Status: DC
  Administered 2018-02-01: 2 m[IU]/min via INTRAVENOUS
  Filled 2018-02-01: qty 1000

## 2018-02-01 MED ORDER — PHENYLEPHRINE 40 MCG/ML (10ML) SYRINGE FOR IV PUSH (FOR BLOOD PRESSURE SUPPORT)
80.0000 ug | PREFILLED_SYRINGE | INTRAVENOUS | Status: DC | PRN
  Filled 2018-02-01: qty 10

## 2018-02-01 MED ORDER — LORAZEPAM 0.5 MG PO TABS
0.5000 mg | ORAL_TABLET | Freq: Two times a day (BID) | ORAL | Status: DC | PRN
  Administered 2018-02-02: 0.5 mg via ORAL
  Filled 2018-02-01 (×2): qty 1

## 2018-02-01 MED ORDER — LACTATED RINGERS IV SOLN
500.0000 mL | Freq: Once | INTRAVENOUS | Status: AC
  Administered 2018-02-01: 1000 mL via INTRAVENOUS

## 2018-02-01 MED ORDER — LORAZEPAM 0.5 MG PO TABS
0.5000 mg | ORAL_TABLET | Freq: Two times a day (BID) | ORAL | Status: DC
  Administered 2018-02-01 (×2): 0.5 mg via ORAL
  Filled 2018-02-01 (×5): qty 1

## 2018-02-01 MED ORDER — PHENYLEPHRINE 40 MCG/ML (10ML) SYRINGE FOR IV PUSH (FOR BLOOD PRESSURE SUPPORT): 80.0000 ug | PREFILLED_SYRINGE | INTRAVENOUS | Status: DC | PRN

## 2018-02-01 MED ORDER — FAMOTIDINE IN NACL 20-0.9 MG/50ML-% IV SOLN: 20.0000 mg | Freq: Two times a day (BID) | INTRAVENOUS | Status: DC

## 2018-02-01 MED ORDER — LIDOCAINE HCL (PF) 1 % IJ SOLN
INTRAMUSCULAR | Status: DC | PRN
  Administered 2018-02-01: 6 mL via EPIDURAL

## 2018-02-01 MED ORDER — PHENYLEPHRINE 40 MCG/ML (10ML) SYRINGE FOR IV PUSH (FOR BLOOD PRESSURE SUPPORT)
80.0000 ug | PREFILLED_SYRINGE | INTRAVENOUS | Status: DC | PRN
Start: 2018-02-01 — End: 2018-02-02

## 2018-02-01 MED ORDER — FENTANYL 2.5 MCG/ML BUPIVACAINE 1/10 % EPIDURAL INFUSION (WH - ANES)
14.0000 mL/h | INTRAMUSCULAR | Status: DC | PRN
  Administered 2018-02-01 – 2018-02-02 (×3): 14 mL/h via EPIDURAL
  Filled 2018-02-01 (×4): qty 100

## 2018-02-01 MED ORDER — CALCIUM CARBONATE ANTACID 500 MG PO CHEW
2.0000 | CHEWABLE_TABLET | ORAL | Status: DC | PRN
  Administered 2018-02-01: 400 mg via ORAL
  Filled 2018-02-01: qty 2

## 2018-02-01 MED ORDER — TERBUTALINE SULFATE 1 MG/ML IJ SOLN: 0.2500 mg | Freq: Once | INTRAMUSCULAR | Status: DC | PRN

## 2018-02-01 MED ORDER — FAMOTIDINE 20 MG PO TABS
20.0000 mg | ORAL_TABLET | Freq: Two times a day (BID) | ORAL | Status: DC
  Administered 2018-02-01 (×2): 20 mg via ORAL
  Filled 2018-02-01 (×2): qty 1

## 2018-02-01 MED ORDER — LACTATED RINGERS IV SOLN: 500.0000 mL | Freq: Once | INTRAVENOUS | Status: DC

## 2018-02-01 NOTE — Progress Notes (Signed)
Patient ID: Brandy [name], female   DOB: 10-24-1985, 32 y.o.   MRN: [id number] Foley bulb came out and SROM noted.  SVE 2cm and thick per nursing Pitocin per protocol now ( was at 65mus) Expectant mgmt

## 2018-02-01 NOTE — Progress Notes (Signed)
Patient began complaining of her "head just not feeling right...something feels wrong." Complete assessment performed, with no changes from prior assessment of pt. No neurological changes, no cardiac, no respiratory changes. Pt's anxiety has increased since feeling this way. Called Dr Willis Modena to make him aware of pt's complaints & lack of assessment changes. Order received for ativan (placed). Continue with cytotec induction.

## 2018-02-01 NOTE — Anesthesia Pain Management Evaluation Note (Signed)
  CRNA Pain Management Visit Note  Patient: Brandy [name], 32 y.o., female  "Hello I am a member of the anesthesia team at [facility]. We have an anesthesia team available at all times to provide care throughout the hospital, including epidural management and anesthesia for C-section. I don't know your plan for the delivery whether it a natural birth, water birth, IV sedation, nitrous supplementation, doula or epidural, but we want to meet your pain goals."   1.Was your pain managed to your expectations on prior hospitalizations?   No prior hospitalizations  2.What is your expectation for pain management during this hospitalization?     Epidural  3.How can we help you reach that goal? Epidural when desired  Record the patient's initial score and the patient's pain goal.   Pain: 3  Pain Goal: 6 [facility] wants you to be able to say your pain was always managed very well.  Brandy [name] 02/01/2018

## 2018-02-01 NOTE — Anesthesia Preprocedure Evaluation (Signed)
Anesthesia Evaluation  Patient identified by MRN, date of birth, ID band  Reviewed: Allergy & Precautions, NPO status   Airway Mallampati: II  TM Distance: >3 FB Neck ROM: Full    Dental no notable dental hx. (+) Dental Advisory Given, Teeth Intact   Pulmonary neg pulmonary ROS,    Pulmonary exam normal breath sounds clear to auscultation       Cardiovascular negative cardio ROS Normal cardiovascular exam Rhythm:Regular Rate:Normal     Neuro/Psych  Headaches, Anxiety evaluated by endocrinology and neurology-they are not sure her symptoms are from this adenoma as it is not large and as far as we know is not rapidly growing.  However, the patient has been c/o severe headaches, minimal relief, if any, from meds including oxycodone.  Her headache is unrelenting and this is making her pre-existing anxiety worse    GI/Hepatic negative GI ROS,   Endo/Other  negative endocrine ROS  Renal/GU      Musculoskeletal   Abdominal   Peds  Hematology   Anesthesia Other Findings   Reproductive/Obstetrics (+) Pregnancy                             Lab Results  Component Value Date   WBC 7.6 01/31/2018   HGB 12.2 01/31/2018   HCT 37.4 01/31/2018   MCV 91.4 01/31/2018   PLT 271 01/31/2018    Anesthesia Physical Anesthesia Plan  ASA: II  Anesthesia Plan: Epidural   Post-op Pain Management:    Induction:   PONV Risk Score and Plan:   Airway Management Planned:   Additional Equipment:   Intra-op Plan:   Post-operative Plan:   Informed Consent: I have reviewed the patients History and Physical, chart, labs and discussed the procedure including the risks, benefits and alternatives for the proposed anesthesia with the patient or authorized representative who has indicated his/her understanding and acceptance.     Plan Discussed with:   Anesthesia Plan Comments:         Anesthesia Quick  Evaluation

## 2018-02-01 NOTE — Progress Notes (Signed)
Patient ID: Brandy [name], female   DOB: Mar 28, 1986, 32 y.o.   MRN: [id number] Pt reported painful intolerable contractions so received epidural; now comfortable VSS EFM - 150, cat 1 TOCO - ctxs q 1-50mins SVE - 1/80/-2; ballotable  A/P: Prime at 72 5/7wks s/p cytotec x 3, on pit with no cervical chagne in past 4 hours.          Cooks foley catheter placed 60u/30v         Recheck prn         AROM when able

## 2018-02-01 NOTE — Progress Notes (Signed)
Patient ID: Brandy [name], female   DOB: May 31, 1986, 32 y.o.   MRN: [id number] Pt reports no headache at this time. No n/v this am either. She rates contractions at 4/10 after 3 doses of cytotec Cervix 1/80/-2 Cat 1 strip Contractions q 1-2 mins  Plan: Will allow to eat now then start on pitocin per protocol at 10am.          Expectant mgmt          Ativan for anxiety

## 2018-02-01 NOTE — Anesthesia Procedure Notes (Signed)
Epidural Patient location during procedure: OB Start time: 02/01/2018 11:40 AM End time: 02/01/2018 11:56 AM  Staffing Anesthesiologist: Barnet Glasgow, MD Performed: anesthesiologist   Preanesthetic Checklist Completed: patient identified, site marked, surgical consent, pre-op evaluation, timeout performed, IV checked, risks and benefits discussed and monitors and equipment checked  Epidural Patient position: sitting Prep: site prepped and draped and DuraPrep Patient monitoring: continuous pulse ox and blood pressure Approach: midline Location: L3-L4 Injection technique: LOR air  Needle:  Needle type: Tuohy  Needle gauge: 17 G Needle length: 9 cm and 9 Needle insertion depth: 5 cm cm Catheter type: closed end flexible Catheter size: 19 Gauge Catheter at skin depth: 10 cm Test dose: negative  Assessment Events: blood not aspirated, injection not painful, no injection resistance, negative IV test and no paresthesia

## 2018-02-01 NOTE — Progress Notes (Signed)
Patient ID: Brandy [name], female   DOB: 1985-11-11, 32 y.o.   MRN: [id number] Pt checked per nurse while I was in OR  3cm/thick Pit at 69mus  Cat 1, 145 Continue with expectant mgmt

## 2018-02-01 NOTE — Progress Notes (Signed)
Patient ID: Brandy [name], female   DOB: 1986/08/27, 32 y.o.   MRN: [id number] Pt doing well. No complaints VSS 145, variable decels noted, moderate variability ctxs 1-38mins 3-4cm/90/-2  Progressing well in labor on pit at 21mus IUPC and FSE placed  Expectant mgmt ; amnioinfusion if indicated

## 2018-02-02 ENCOUNTER — Encounter (HOSPITAL_COMMUNITY): Payer: Self-pay | Admitting: *Deleted

## 2018-02-02 MED ORDER — BENZOCAINE-MENTHOL 20-0.5 % EX AERO
1.0000 "application " | INHALATION_SPRAY | CUTANEOUS | Status: DC | PRN
  Administered 2018-02-02: 1 via TOPICAL
  Filled 2018-02-02 (×2): qty 56

## 2018-02-02 MED ORDER — SENNOSIDES-DOCUSATE SODIUM 8.6-50 MG PO TABS
2.0000 | ORAL_TABLET | ORAL | Status: DC
  Administered 2018-02-03: 2 via ORAL
  Filled 2018-02-02 (×2): qty 2

## 2018-02-02 MED ORDER — ACETAMINOPHEN 325 MG PO TABS
650.0000 mg | ORAL_TABLET | ORAL | Status: DC | PRN
  Administered 2018-02-03 – 2018-02-04 (×2): 650 mg via ORAL
  Filled 2018-02-02 (×2): qty 2

## 2018-02-02 MED ORDER — ONDANSETRON HCL 4 MG PO TABS
4.0000 mg | ORAL_TABLET | ORAL | Status: DC | PRN
  Administered 2018-02-03: 4 mg via ORAL
  Filled 2018-02-02: qty 1

## 2018-02-02 MED ORDER — DIBUCAINE 1 % RE OINT
1.0000 "application " | TOPICAL_OINTMENT | RECTAL | Status: DC | PRN
  Filled 2018-02-02 (×2): qty 28

## 2018-02-02 MED ORDER — IBUPROFEN 600 MG PO TABS
600.0000 mg | ORAL_TABLET | Freq: Four times a day (QID) | ORAL | Status: DC
  Administered 2018-02-02 – 2018-02-04 (×7): 600 mg via ORAL
  Filled 2018-02-02 (×7): qty 1

## 2018-02-02 MED ORDER — DIPHENHYDRAMINE HCL 25 MG PO CAPS: 25.0000 mg | ORAL_CAPSULE | Freq: Four times a day (QID) | ORAL | Status: DC | PRN

## 2018-02-02 MED ORDER — PRENATAL MULTIVITAMIN CH
1.0000 | ORAL_TABLET | Freq: Every day | ORAL | Status: DC
  Administered 2018-02-02 – 2018-02-03 (×2): 1 via ORAL
  Filled 2018-02-02 (×2): qty 1

## 2018-02-02 MED ORDER — OXYCODONE HCL 5 MG PO TABS: 5.0000 mg | ORAL_TABLET | ORAL | Status: DC | PRN

## 2018-02-02 MED ORDER — TETANUS-DIPHTH-ACELL PERTUSSIS 5-2.5-18.5 LF-MCG/0.5 IM SUSP: 0.5000 mL | Freq: Once | INTRAMUSCULAR | Status: DC

## 2018-02-02 MED ORDER — COCONUT OIL OIL
1.0000 "application " | TOPICAL_OIL | Status: DC | PRN
  Filled 2018-02-02: qty 120

## 2018-02-02 MED ORDER — SIMETHICONE 80 MG PO CHEW: 80.0000 mg | CHEWABLE_TABLET | ORAL | Status: DC | PRN

## 2018-02-02 MED ORDER — ONDANSETRON HCL 4 MG/2ML IJ SOLN: 4.0000 mg | INTRAMUSCULAR | Status: DC | PRN

## 2018-02-02 MED ORDER — ZOLPIDEM TARTRATE 5 MG PO TABS: 5.0000 mg | ORAL_TABLET | Freq: Every evening | ORAL | Status: DC | PRN

## 2018-02-02 MED ORDER — BROMOCRIPTINE MESYLATE 2.5 MG PO TABS
1.2500 mg | ORAL_TABLET | Freq: Every day | ORAL | Status: DC
  Filled 2018-02-02 (×2): qty 1

## 2018-02-02 MED ORDER — OXYCODONE HCL 5 MG PO TABS: 10.0000 mg | ORAL_TABLET | ORAL | Status: DC | PRN

## 2018-02-02 MED ORDER — WITCH HAZEL-GLYCERIN EX PADS: 1.0000 "application " | MEDICATED_PAD | CUTANEOUS | Status: DC | PRN

## 2018-02-02 NOTE — Progress Notes (Signed)
Post Partum Day 0 Subjective: Pt resting in postpartum room and had breakfast.  Some dull HA but not severe at moment  Objective: Blood pressure 116/78, pulse 86, temperature 98.2 F (36.8 C), temperature source Oral, resp. rate 20, height 5' 8.5" (1.74 m), weight 99.8 kg (220 lb), last menstrual period 05/13/2017, SpO2 99 %, unknown if currently breastfeeding.  Physical Exam:  General: alert and cooperative Lochia: appropriate Uterine Fundus: firm   Recent Labs    01/31/18 1800  HGB 12.2  HCT 37.4    Assessment/Plan: Continue care   LOS: 2 days   Logan Bores 02/02/2018, 10:09 AM

## 2018-02-02 NOTE — Progress Notes (Signed)
Patient ID: Brandy [name], female   DOB: 26-Mar-1986, 32 y.o.   MRN: [id number] Pt noting increasing pelvic pressure Pitocin was turned off for about an hour after a run of recurrent decels did not resolve with amnioinfusion, iv fluid bolus and position changes. Pitocin was restarted at 39mus ( was at 10 before) and reassuring fetal heart rate noted since.  Pt was a rim about 68mins ago Plan to start pushing once complete

## 2018-02-02 NOTE — Anesthesia Postprocedure Evaluation (Signed)
Anesthesia Post Note  Patient: Brandy [name]  Procedure(s) Performed: AN AD [place]     Patient location during evaluation: Mother Baby Anesthesia Type: Epidural Level of consciousness: awake Pain management: satisfactory to patient Vital Signs Assessment: post-procedure vital signs reviewed and stable Respiratory status: spontaneous breathing Cardiovascular status: stable Anesthetic complications: no    Last Vitals:  Vitals:   02/02/18 0935 02/02/18 1324  BP: 116/78 111/76  Pulse: 86 79  Resp: 20 18  Temp: 36.8 C 36.6 C  SpO2:      Last Pain:  Vitals:   02/02/18 1331  TempSrc:   PainSc: 2    Pain Goal: Patients Stated Pain Goal: 3 (01/31/18 2335)               Casimer Lanius

## 2018-02-02 NOTE — Progress Notes (Signed)
Patient ID: Brandy [name], female   DOB: [birth year], 33 y.o.   MRN: [id number] Pt requested earlier today to being bromocriptine for her adenoma.  I will draw a prolactin level in AM and start the lowest dose of parlodel tomorrow AM.

## 2018-02-03 LAB — CBC
HEMATOCRIT: 34.3 % — AB (ref 36.0–46.0)
HEMOGLOBIN: 11.5 g/dL — AB (ref 12.0–15.0)
MCH: 30 pg (ref 26.0–34.0)
MCHC: 33.5 g/dL (ref 30.0–36.0)
MCV: 89.6 fL (ref 78.0–100.0)
Platelets: 238 10*3/uL (ref 150–400)
RBC: 3.83 MIL/uL — ABNORMAL LOW (ref 3.87–5.11)
RDW: 13.9 % (ref 11.5–15.5)
WBC: 13.2 10*3/uL — ABNORMAL HIGH (ref 4.0–10.5)

## 2018-02-03 MED ORDER — CYCLOBENZAPRINE HCL 5 MG PO TABS
5.0000 mg | ORAL_TABLET | Freq: Three times a day (TID) | ORAL | Status: DC | PRN
  Administered 2018-02-03 – 2018-02-04 (×2): 5 mg via ORAL
  Filled 2018-02-03 (×4): qty 1

## 2018-02-03 MED ORDER — CABERGOLINE 0.5 MG PO TABS
0.2500 mg | ORAL_TABLET | Freq: Once | ORAL | Status: AC
  Administered 2018-02-03: 0.25 mg via ORAL
  Filled 2018-02-03: qty 1

## 2018-02-03 MED ORDER — ALPRAZOLAM 0.5 MG PO TABS
0.2500 mg | ORAL_TABLET | Freq: Three times a day (TID) | ORAL | Status: DC | PRN
  Filled 2018-02-03: qty 1

## 2018-02-03 MED ORDER — MUSCLE RUB 10-15 % EX CREA
TOPICAL_CREAM | CUTANEOUS | Status: DC | PRN
  Administered 2018-02-04: 05:00:00 via TOPICAL
  Filled 2018-02-03: qty 85

## 2018-02-03 MED ORDER — SERTRALINE HCL 25 MG PO TABS
25.0000 mg | ORAL_TABLET | Freq: Every day | ORAL | Status: DC
  Administered 2018-02-04: 25 mg via ORAL
  Filled 2018-02-03 (×2): qty 1

## 2018-02-03 NOTE — Progress Notes (Signed)
Post Partum Day 1 Subjective: up ad lib, voiding, tolerating PO and nl lochia, pain controlled.  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  Call to confer w pharmacy.  Will start cabergoline 0.25mg  biw  Objective: Blood pressure 114/77, pulse 82, temperature 98.4 F (36.9 C), temperature source Oral, resp. rate 20, height 5' 8.5" (1.74 m), weight 99.8 kg (220 lb), last menstrual period 05/13/2017, SpO2 99 %, unknown if currently breastfeeding.  Physical Exam:  General: alert and no distress Lochia: appropriate Uterine Fundus: firm  Recent Labs    01/31/18 1800 02/03/18 0552  HGB 12.2 11.5*  HCT 37.4 34.3*    Assessment/Plan: Plan for discharge tomorrow.     LOS: 3 days   Itzayanna Kaster Bovard-Stuckert 02/03/2018, 7:45 AM

## 2018-02-03 NOTE — Progress Notes (Signed)
Patient ID: Brandy [name], female   DOB: April 23, 1986, 32 y.o.   MRN: [id number]   Called to see pt due to new symptoms.  Pt c/o neck stiffness and pain in head and neck - d/w pt position for pushing and muscle strain.  Full ROM in neck.  Muscle spasm palpated in neck.  No visual changes, no facial changes, face is symmetry.  Wrote for cyclobenzaprine - warned pt may make sleepy.    Ambulating to bathroom, seems stable.  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  Has endocrinology f/u  While pt in restrrom partner confided "she needs medicine for anxiety"  D/W pt lack of sleep with new born and symptoms that can accompany.  Recommend rest and sleep.    Also d/w pt anxiety issues and treatment.  Agreeable to starting Zoloft, also prn Xanax as getting into system.  She is not breastfeeding  She state, "don't want you all to think I'm crazy"  Endorsed we do not and just want her to feel better.   Husband present through interaction

## 2018-02-03 NOTE — Progress Notes (Signed)
Called Dr. Melba Coon a second time per patient's request. She sates,  "something is not right". "I know my body"  Brandy [name] states this is something new. She feels a squeezing pain from the base of her posterior neck to the back of her head. She feels that something is wrong. Dr.Bovard states she will be coming to see her and for her to rest until she arrives.

## 2018-02-03 NOTE — Progress Notes (Signed)
RN went to round on patient and asked about pain. Patient stated that her pain was better because of "the patch". Pt stated family gave her an OTC lidocaine patch and she put it on the knot on back between shoulder blades . RN observed area and patient pointed to top of spine as the knot. No swelling or abnormalities noted by RN other than tightness. RN explained to patient that we couldn't give medications not prescribed. Patient removed patch. Provider notified. Will continue to monitor.

## 2018-02-03 NOTE — Progress Notes (Signed)
MOB was referred for history of depression/anxiety. * Referral screened out by Clinical Social Worker because none of the following criteria appear to apply: ~ History of anxiety/depression during this pregnancy, or of post-partum depression. ~ Diagnosis of anxiety and/or depression within last 3 years OR * MOB's symptoms currently being treated with medication and/or therapy. Please contact the Clinical Social Worker if needs arise, by MOB request, or if MOB scores greater than 9/yes to question 10 on [place] Postpartum Depression Screen.  Paymon Rosensteel Boyd-Gilyard, MSW, LCSW Clinical Social Work [phone] 

## 2018-02-03 NOTE — Progress Notes (Signed)
Patient ID: Brandy [name], female   DOB: [birth year], 32 y.o.   MRN: [id number]   Called by nurse, pt had lidocaine patch on her neck Removed by RN, d/w pt not taking medicines not prescribed, and also taking prescribed medicines.  Pt given muscle rub cream (similar to Icy hot) Encouraged to use prescribed meds (ie cyclobenzaprine)

## 2018-02-03 NOTE — Progress Notes (Signed)
Patient called out asking for her nurse.  Ms. Dickenson stated that she felt a squeezing pressure in the back of her head, felt dizzy and it is bothering her eyes. She stated she "never felt this way before." Dr. Sandford Craze called and notified of her complaints. Vital signs take at this time Temp= 98.4, BP=129/84 P=96-103, RR=20. O2 Sat=99%. No new orders but MD stated she should take a nap.

## 2018-02-03 NOTE — Progress Notes (Signed)
Heating pad given.  Patient stated she threw up a couple hours ago and nervous about taking meds.  Zofran po was given.  She agreed to take flexeril at this time but wants to hold off on zoloft and xanax.  Husband and family tried to encourage her to take xanax and told her they felt she is anxious.  Pt stated she will call nurse when she is ready for that med.

## 2018-02-04 ENCOUNTER — Encounter (HOSPITAL_COMMUNITY): Payer: Self-pay | Admitting: *Deleted

## 2018-02-04 LAB — PROLACTIN: PROLACTIN: 432.6 ng/mL — AB (ref 4.8–23.3)

## 2018-02-04 MED ORDER — IBUPROFEN 600 MG PO TABS: 600.0000 mg | ORAL_TABLET | Freq: Four times a day (QID) | ORAL | 0 refills | Status: DC

## 2018-02-04 MED ORDER — CYCLOBENZAPRINE HCL 5 MG PO TABS: 5.0000 mg | ORAL_TABLET | Freq: Three times a day (TID) | ORAL | 0 refills | Status: DC | PRN

## 2018-02-04 MED ORDER — SERTRALINE HCL 50 MG PO TABS: 50.0000 mg | ORAL_TABLET | Freq: Every day | ORAL | 6 refills | Status: DC

## 2018-02-04 NOTE — Progress Notes (Signed)
PPD #2 Doing ok, worried because she feels like there is fluid in the back of her neck swishing around Afeb, VSS Fundus firm D/c home, reassured I do not think her neck symptoms are anything dangerous

## 2018-02-04 NOTE — Discharge Instructions (Signed)
As per discharge pamphlet °

## 2018-02-04 NOTE — Discharge Summary (Signed)
OB Discharge Summary     Patient Name: Brandy [name] DOB: [birth year] MRN: [id number]  Date of admission: 01/31/2018 Delivering MD: Carlynn Purl [facility]   Date of discharge: 02/04/2018  Admitting diagnosis: 60WKS VISION CHANGES, HEADACHE AND NAUSEA Intrauterine pregnancy: [redacted]w[redacted]d     Secondary diagnosis:  Active Problems:   Headache in pregnancy, third trimester   SVD (spontaneous vaginal delivery)   Postpartum care following vaginal delivery  Additional problems: pituitary adenoma     Discharge diagnosis: Term Pregnancy Delivered and intractable headache, pituitary adenoma                                   Hospital course:  Induction of Labor With Vaginal Delivery   32 y.o. yo G2P1011 at [redacted]w[redacted]d was admitted to the hospital 01/31/2018 for induction of labor.  Indication for induction: intractable headache, pituitary adenoma.  Patient had an uncomplicated labor course as follows:  3 doses of cytotec, followed by pitocin Membrane Rupture Time/Date: 3:20 PM ,02/01/2018   Intrapartum Procedures: Episiotomy:                                           Lacerations:  2nd degree [3];Perineal [11]  Patient had delivery of a Viable infant.  Information for the patient's newborn:  Ludia, [place] [[id number]]  Delivery Method: Vaginal, Spontaneous(Filed from Delivery Summary)   02/02/2018  Details of delivery can be found in separate delivery note.  Patient had some headaches, neck pain and back pain, mostly controlled with heat and Flexeril, also started on Zoloft. Patient is discharged home 02/04/18.  Physical exam  Vitals:   02/03/18 0549 02/03/18 1459 02/03/18 1828 02/04/18 0452  BP: 114/77 129/84 127/79 105/83  Pulse: 82 96 98 96  Resp: 20 20 19 20   Temp: 98.4 F (36.9 C) 98.4 F (36.9 C) 98.1 F (36.7 C) 98 F (36.7 C)  TempSrc: Oral Oral  Oral  SpO2: 99% 99%  99%  Weight:      Height:       General: alert Lochia: appropriate Uterine Fundus: firm  Labs: Lab Results   Component Value Date   WBC 13.2 (H) 02/03/2018   HGB 11.5 (L) 02/03/2018   HCT 34.3 (L) 02/03/2018   MCV 89.6 02/03/2018   PLT 238 02/03/2018   CMP Latest Ref Rng & Units 01/31/2018  Glucose 65 - 99 mg/dL 118(H)  BUN 6 - 20 mg/dL 7  Creatinine 0.44 - 1.00 mg/dL 0.80  Sodium 135 - 145 mmol/L 135  Potassium 3.5 - 5.1 mmol/L 3.5  Chloride 101 - 111 mmol/L 105  CO2 22 - 32 mmol/L 20(L)  Calcium 8.9 - 10.3 mg/dL 8.8(L)  Total Protein 6.5 - 8.1 g/dL 6.7  Total Bilirubin 0.3 - 1.2 mg/dL 0.4  Alkaline Phos 38 - 126 U/L 91  AST 15 - 41 U/L 13(L)  ALT 14 - 54 U/L 13(L)    Discharge instruction: per After Visit Summary and "Baby and Me Booklet".  After visit meds:  Allergies as of 02/04/2018      Reactions   Penicillins Hives, Other (See Comments)   Childhood reaction Has patient had a PCN reaction causing immediate rash, facial/tongue/throat swelling, SOB or lightheadedness with hypotension: No Has patient had a PCN reaction causing severe rash involving mucus membranes or skin necrosis:  No Has patient had a PCN reaction that required hospitalization No Has patient had a PCN reaction occurring within the last 10 years: No If all of the above answers are "NO", then may proceed with Cephalosporin use.      Medication List    STOP taking these medications   azithromycin 250 MG tablet Commonly known as:  ZITHROMAX     TAKE these medications   acetaminophen 500 MG tablet Commonly known as:  TYLENOL Take 500 mg by mouth every 6 (six) hours as needed.   cyclobenzaprine 5 MG tablet Commonly known as:  FLEXERIL Take 1 tablet (5 mg total) by mouth 3 (three) times daily as needed for muscle spasms.   diphenhydrAMINE 25 MG tablet Commonly known as:  BENADRYL Take 1 tablet (25 mg total) by mouth every 6 (six) hours as needed (headache).   ibuprofen 600 MG tablet Commonly known as:  ADVIL,MOTRIN Take 1 tablet (600 mg total) by mouth every 6 (six) hours.   metoCLOPramide 10 MG  tablet Commonly known as:  REGLAN Take 1 tablet (10 mg total) by mouth every 6 (six) hours as needed for nausea (headache).   ondansetron 8 MG disintegrating tablet Commonly known as:  ZOFRAN-ODT DISSOLVE 1 TABLET IN MOUTH TWICE DAILY AS NEEDED   oxyCODONE 5 MG immediate release tablet Commonly known as:  Oxy IR/ROXICODONE TAKE 1 TABLET BY MOUTH EVERY 4 HOURS AS NEEDED FOR UP TO 3 DAYS FOR SEVERE PAIN.   prenatal multivitamin Tabs tablet Take 1 tablet by mouth daily at 12 noon.   ranitidine 150 MG capsule Commonly known as:  ZANTAC Take 150 mg by mouth daily.   sertraline 50 MG tablet Commonly known as:  ZOLOFT Take 1 tablet (50 mg total) by mouth daily.       Diet: routine diet  Activity: Advance as tolerated. Pelvic rest for 6 weeks.   Outpatient follow up:6 weeks  Newborn Data: Live born female  Birth Weight: 6 lb 4.4 oz (2845 g) APGAR: 7, 9  Newborn Delivery   Birth date/time:  02/02/2018 07:00:00 Delivery type:  Vaginal, Spontaneous     Baby Feeding: Breast Disposition:home with mother   02/04/2018 Clarene Duke, MD

## 2018-02-07 ENCOUNTER — Encounter: Payer: Self-pay | Admitting: Family Medicine

## 2018-02-07 ENCOUNTER — Ambulatory Visit: Payer: 59 | Admitting: Family Medicine

## 2018-02-07 VITALS — BP 124/83 | HR 87 | Temp 98.4°F | Resp 12 | Ht 68.5 in | Wt 215.1 lb

## 2018-02-07 DIAGNOSIS — F411 Generalized anxiety disorder: Secondary | ICD-10-CM

## 2018-02-07 DIAGNOSIS — F53 Postpartum depression: Secondary | ICD-10-CM

## 2018-02-07 DIAGNOSIS — G4452 New daily persistent headache (NDPH): Secondary | ICD-10-CM | POA: Diagnosis not present

## 2018-02-07 DIAGNOSIS — O99345 Other mental disorders complicating the puerperium: Secondary | ICD-10-CM

## 2018-02-07 MED ORDER — ALPRAZOLAM 0.25 MG PO TABS: ORAL_TABLET | ORAL | 0 refills | Status: DC

## 2018-02-07 MED ORDER — BACLOFEN 10 MG PO TABS: 10.0000 mg | ORAL_TABLET | Freq: Three times a day (TID) | ORAL | 1 refills | Status: DC | PRN

## 2018-02-07 NOTE — Progress Notes (Signed)
HPI:  Chief Complaint  Patient presents with  . Migraine    pressure behind eyes  . Anxiety    Brandy [name] is a 32 y.o. female, who is here today complaining of persistent frontal, bitemporal, and sometimes occipital pressure headache. Started with left retroocular headache when she was [redacted] weeks pregnant. According to pt,she had brain MRI and pituitary gland was enlarged, mildly bigger than expected in pregnancy. She is following with endocrinologist,supect microadenoma,and planning on repeating MRI with contrast.   She was evaluated by neuro ophthalmologist because retro ocular headache associated with floaters at [redacted] weeks gestational age. According to pt,everything was negative.  Headache is almost daily. Associated with nausea and vomiting,depending of pain intensity.Last episode of vomiting was 2 days ago. She is taking Zofran and Phenergan prn.  No photophobia.  She is taking Tylenol and Ibuprofen 800 mg qid, help little. + Neck pain.  Headache is exacerbated by lying down and alleviated when she standing up and taking analgesics.   She delivered her daughter on 02/02/18. She is not breast feeding.  Anxiety getting worse. Her gyn started her on Zoloft 25 mg a few days ago to treat post partum depression. She is tolerating well. She denies suicidal thoughts.  She is requesting Xanax.    Review of Systems  Constitutional: Positive for fatigue. Negative for appetite change and fever.  HENT: Negative for mouth sores, sore throat, trouble swallowing and voice change.   Eyes: Negative for photophobia and visual disturbance.  Respiratory: Negative for cough, shortness of breath and wheezing.   Cardiovascular: Negative for chest pain, palpitations and leg swelling.  Gastrointestinal: Positive for nausea. Negative for abdominal pain.  Musculoskeletal: Positive for neck pain. Negative for gait problem.  Skin: Negative for pallor and rash.  Neurological:  Positive for headaches. Negative for syncope, weakness and numbness.  Psychiatric/Behavioral: Negative for confusion and suicidal ideas. The patient is nervous/anxious.       Current Outpatient Medications on File Prior to Visit  Medication Sig Dispense Refill  . acetaminophen (TYLENOL) 500 MG tablet Take 500 mg by mouth every 6 (six) hours as needed.    . cyclobenzaprine (FLEXERIL) 5 MG tablet Take 1 tablet (5 mg total) by mouth 3 (three) times daily as needed for muscle spasms. 30 tablet 0  . ibuprofen (ADVIL,MOTRIN) 600 MG tablet Take 1 tablet (600 mg total) by mouth every 6 (six) hours. 30 tablet 0  . ondansetron (ZOFRAN-ODT) 8 MG disintegrating tablet DISSOLVE 1 TABLET IN MOUTH TWICE DAILY AS NEEDED  1  . oxyCODONE (OXY IR/ROXICODONE) 5 MG immediate release tablet TAKE 1 TABLET BY MOUTH EVERY 4 HOURS AS NEEDED FOR UP TO 3 DAYS FOR SEVERE PAIN.  0  . Prenatal Vit-Fe Fumarate-FA (PRENATAL MULTIVITAMIN) TABS tablet Take 1 tablet by mouth daily at 12 noon.    . ranitidine (ZANTAC) 150 MG capsule Take 150 mg by mouth daily.    . sertraline (ZOLOFT) 50 MG tablet Take 1 tablet (50 mg total) by mouth daily. 30 tablet 6   No current facility-administered medications on file prior to visit.      Past Medical History:  Diagnosis Date  . Anxiety   . Depression   . Gallstones   . History of nephrolithiasis   . Kidney stone   . Migraine   . Pituitary tumor   . Vitamin D deficiency    Allergies  Allergen Reactions  . Penicillins Hives and Other (See Comments)  Childhood reaction Has patient had a PCN reaction causing immediate rash, facial/tongue/throat swelling, SOB or lightheadedness with hypotension: No Has patient had a PCN reaction causing severe rash involving mucus membranes or skin necrosis: No Has patient had a PCN reaction that required hospitalization No Has patient had a PCN reaction occurring within the last 10 years: No If all of the above answers are "NO", then may  proceed with Cephalosporin use.     Social History   Socioeconomic History  . Marital status: Married    Spouse name: Not on file  . Number of children: 0  . Years of education: Not on file  . Highest education level: Not on file  Occupational History  . Occupation: Consulting civil engineer  Social Needs  . Financial resource strain: Not on file  . Food insecurity:    Worry: Not on file    Inability: Not on file  . Transportation needs:    Medical: Not on file    Non-medical: Not on file  Tobacco Use  . Smoking status: Never Smoker  . Smokeless tobacco: Never Used  Substance and Sexual Activity  . Alcohol use: Not Currently    Comment: social-1 every couple of months  . Drug use: No  . Sexual activity: Yes  Lifestyle  . Physical activity:    Days per week: Not on file    Minutes per session: Not on file  . Stress: Not on file  Relationships  . Social connections:    Talks on phone: Not on file    Gets together: Not on file    Attends religious service: Not on file    Active member of club or organization: Not on file    Attends meetings of clubs or organizations: Not on file    Relationship status: Not on file  Other Topics Concern  . Not on file  Social History Narrative  . Not on file    Vitals:   02/07/18 0918  BP: 124/83  Pulse: 87  Resp: 12  Temp: 98.4 F (36.9 C)  SpO2: 100%   Body mass index is 32.23 kg/m.    Physical Exam  Nursing note and vitals reviewed. Constitutional: She is oriented to person, place, and time. She appears well-developed. No distress.  HENT:  Head: Normocephalic and atraumatic.  Mouth/Throat: Oropharynx is clear and moist and mucous membranes are normal.  Eyes: Pupils are equal, round, and reactive to light. Conjunctivae and EOM are normal.  Cardiovascular: Normal rate and regular rhythm.  No murmur heard. Pulses:      Dorsalis pedis pulses are 2+ on the right side, and 2+ on the left side.  Respiratory: Effort normal and  breath sounds normal. No respiratory distress.  GI: Soft. She exhibits no mass. There is no hepatomegaly. There is no tenderness.  Musculoskeletal: She exhibits no edema.       Cervical back: She exhibits tenderness. She exhibits normal range of motion and no bony tenderness.       Back:  Lymphadenopathy:    She has no cervical adenopathy.  Neurological: She is alert and oriented to person, place, and time. She has normal strength. No cranial nerve deficit. Coordination and gait normal.  Skin: Skin is warm. No rash noted. No erythema.  Psychiatric: Her mood appears anxious.  Well groomed, good eye contact.     ASSESSMENT AND PLAN:   Ms. Brandy [name] was seen today for migraine and anxiety.  Diagnoses and all orders for this visit:  New daily persistent headache  ? Tension headache vs migraine. Treatment options discussed. She agrees with trying Baclofen,some side effects discussed. Instructed about warning signs. F/U in 6 weeks. If not better we need to consider neuro evaluation.  -     baclofen (LIORESAL) 10 MG tablet; Take 1 tablet (10 mg total) by mouth 3 (three) times daily as needed for muscle spasms.  Generalized anxiety disorder -     ALPRAZolam (XANAX) 0.25 MG tablet; Xanax 0.25 mg tablet  Take 1 tablet twice a day by oral route as needed.  Postpartum depression  Zoloft increased from 25 mg to 50 mg daily. Strongly recommend establishing with psychotherapist. F/U in 6 weeks,before if needed.     Return in about 6 weeks (around 03/21/2018) for anxiety ,HA.      Betty G. Martinique, MD  [facility]. [place] office.

## 2018-02-07 NOTE — Assessment & Plan Note (Addendum)
Problem is not well controlled, although stable. She will increase Zoloft from 25 mg to 50 mg daily. Prescription for Xanax 0.25 mg to take twice daily as needed was given as requested.  Some side effects discussed. Strongly recommend establishing with psychotherapist, handout information given. Follow-up in 6 weeks, before if needed.

## 2018-02-07 NOTE — Patient Instructions (Addendum)
A few things to remember from today's visit:   New daily persistent headache - Plan: baclofen (LIORESAL) 10 MG tablet  Generalized anxiety disorder  Increased Zoloft from 25 mg to 50 mg. Xanax 0.25 mg twice daily as needed, no more than 40 tablets/month. Please establish with psychotherapist.   Please be sure medication list is accurate. If a new problem present, please set up appointment sooner than planned today.

## 2018-02-08 ENCOUNTER — Ambulatory Visit (HOSPITAL_COMMUNITY)
Admission: RE | Admit: 2018-02-08 | Discharge: 2018-02-08 | Disposition: A | Discharge status: Home / Self Care | Payer: 59 | Source: Ambulatory Visit | Attending: Obstetrics and Gynecology | Admitting: Obstetrics and Gynecology

## 2018-02-08 ENCOUNTER — Encounter (HOSPITAL_COMMUNITY): Payer: Self-pay

## 2018-02-08 ENCOUNTER — Telehealth: Payer: Self-pay | Admitting: Family Medicine

## 2018-02-08 HISTORY — DX: Vitamin D deficiency, unspecified: E55.9

## 2018-02-08 NOTE — Telephone Encounter (Signed)
Copied from [place] [id number]. Topic: Quick Communication - Rx Refill/Question >> Feb 08, 2018 10:02 AM Scherrie Gerlach wrote: Medication: Decadron  Pt states she saw Dr Martinique yesterday and forgot to ask for this med.  Pt states after her hospital visit they prescribed this, and it helped with her pain. Pt states she is in a lot of pain and hopes to get this Rx asap.  Pt got this at [place], [place] [phone] (Phone) [phone] (Fax) Pt would like to know if Dr Martinique or assistant can call back. Pt states she cannot wait until this afternoon to get this med

## 2018-02-08 NOTE — Telephone Encounter (Signed)
Message sent to Dr. Jordan for review and approval. 

## 2018-02-09 ENCOUNTER — Other Ambulatory Visit: Payer: Self-pay | Admitting: *Deleted

## 2018-02-09 MED ORDER — OXYCODONE HCL 5 MG PO TABS
5.00 | ORAL_TABLET | ORAL | Status: DC
Start: ? — End: 2018-02-09

## 2018-02-09 MED ORDER — GENERIC EXTERNAL MEDICATION
Status: DC
Start: ? — End: 2018-02-09

## 2018-02-09 MED ORDER — LORAZEPAM 0.5 MG PO TABS
0.50 | ORAL_TABLET | ORAL | Status: DC
Start: ? — End: 2018-02-09

## 2018-02-09 MED ORDER — IBUPROFEN 600 MG PO TABS
600.00 | ORAL_TABLET | ORAL | Status: DC
Start: ? — End: 2018-02-09

## 2018-02-09 MED ORDER — DIPHENHYDRAMINE HCL 25 MG PO CAPS
25.00 | ORAL_CAPSULE | ORAL | Status: DC
Start: ? — End: 2018-02-09

## 2018-02-09 MED ORDER — ACETAMINOPHEN 325 MG PO TABS
650.00 | ORAL_TABLET | ORAL | Status: DC
Start: ? — End: 2018-02-09

## 2018-02-09 MED ORDER — TRAZODONE HCL 100 MG PO TABS
50.00 | ORAL_TABLET | ORAL | Status: DC
Start: ? — End: 2018-02-09

## 2018-02-09 MED ORDER — DEXAMETHASONE 4 MG PO TABS
4.0000 mg | ORAL_TABLET | Freq: Two times a day (BID) | ORAL | 0 refills | Status: AC
Start: 2018-02-09 — End: 2018-02-12

## 2018-02-09 NOTE — Telephone Encounter (Signed)
Left detailed message with instructions per Dr. Martinique. Rx sent to pharmacy.

## 2018-02-09 NOTE — Telephone Encounter (Signed)
Sometimes oral steroids are used to treat persistent migraine with no clear evidence about its benefit. I do not think Decadron will help with the type of headache she was describing during her visit. If she still would like to try, Decadron 4 mg twice daily for 3 days can be sent to the pharmacy. Continue with rest of treatment as discussed.  Thanks, BJ

## 2018-02-14 ENCOUNTER — Telehealth: Payer: Self-pay

## 2018-02-14 NOTE — Telephone Encounter (Signed)
Message sent to Dr. Jordan for review and approval. 

## 2018-02-14 NOTE — Telephone Encounter (Signed)
Copied from [place] [id number]. Topic: Referral - Request >> Feb 14, 2018  1:04 PM Oliver Pila B wrote: Reason for CRM: pt called to get a referral to a neurologist, call pt to advise    02/17/18 CRM: Pt mother called in and is upset that they have not heard from anyone about the neuro appt?  She stated that pt just had a baby and is needing to see neuro asap?  She is requesting a call back as soon as possible    Hennie Duos [phone]  Q - It looks like the original message was not routed to Dr Martinique - please follow up with pt today. Thanks!

## 2018-02-18 ENCOUNTER — Encounter: Payer: Self-pay | Admitting: Neurology

## 2018-02-18 ENCOUNTER — Other Ambulatory Visit: Payer: Self-pay | Admitting: Family Medicine

## 2018-02-18 DIAGNOSIS — R519 Headache, unspecified: Secondary | ICD-10-CM

## 2018-02-18 DIAGNOSIS — R51 Headache: Principal | ICD-10-CM

## 2018-02-18 NOTE — Telephone Encounter (Signed)
Spoke with patient and informed her that referral was placed and gave instructions per Dr. Martinique. Patient verbalized understanding.

## 2018-02-18 NOTE — Telephone Encounter (Signed)
Message sent to Dr. Martinique, waiting on approval.

## 2018-02-18 NOTE — Telephone Encounter (Signed)
Referral placed. We do not control neuro schedule, so not sure when appt can be arranged. If she has a provider in mind,she can arrange appt and let them now referral was already placed.  If sudden worsening headache or other worsening symptom she needs to seek immediate med attention.  Thanks, BJ

## 2018-03-03 ENCOUNTER — Encounter (HOSPITAL_COMMUNITY): Payer: Self-pay | Admitting: Emergency Medicine

## 2018-03-03 ENCOUNTER — Emergency Department (HOSPITAL_COMMUNITY)
Admission: EM | Admit: 2018-03-03 | Discharge: 2018-03-03 | Disposition: A | Discharge status: Home / Self Care | Payer: 59 | Attending: Emergency Medicine | Admitting: Emergency Medicine

## 2018-03-03 ENCOUNTER — Emergency Department (HOSPITAL_COMMUNITY): Payer: 59

## 2018-03-03 ENCOUNTER — Other Ambulatory Visit: Payer: Self-pay

## 2018-03-03 DIAGNOSIS — Z79899 Other long term (current) drug therapy: Secondary | ICD-10-CM | POA: Insufficient documentation

## 2018-03-03 DIAGNOSIS — F53 Postpartum depression: Secondary | ICD-10-CM | POA: Diagnosis not present

## 2018-03-03 DIAGNOSIS — M791 Myalgia, unspecified site: Secondary | ICD-10-CM

## 2018-03-03 DIAGNOSIS — R079 Chest pain, unspecified: Secondary | ICD-10-CM | POA: Diagnosis present

## 2018-03-03 DIAGNOSIS — O99345 Other mental disorders complicating the puerperium: Secondary | ICD-10-CM

## 2018-03-03 LAB — URINALYSIS, ROUTINE W REFLEX MICROSCOPIC
BACTERIA UA: NONE SEEN
Bilirubin Urine: NEGATIVE
Glucose, UA: NEGATIVE mg/dL
Ketones, ur: NEGATIVE mg/dL
NITRITE: NEGATIVE
Protein, ur: NEGATIVE mg/dL
SPECIFIC GRAVITY, URINE: 1.015 (ref 1.005–1.030)
pH: 7 (ref 5.0–8.0)

## 2018-03-03 LAB — COMPREHENSIVE METABOLIC PANEL
ALK PHOS: 65 U/L (ref 38–126)
ALT: 30 U/L (ref 0–44)
AST: 21 U/L (ref 15–41)
Albumin: 3.8 g/dL (ref 3.5–5.0)
Anion gap: 10 (ref 5–15)
BUN: 9 mg/dL (ref 6–20)
CALCIUM: 9.3 mg/dL (ref 8.9–10.3)
CO2: 24 mmol/L (ref 22–32)
CREATININE: 1.14 mg/dL — AB (ref 0.44–1.00)
Chloride: 108 mmol/L (ref 98–111)
GFR calc non Af Amer: >60 mL/min (ref >60)
Glucose, Bld: 119 mg/dL — ABNORMAL HIGH (ref 70–99)
Potassium: 3.9 mmol/L (ref 3.5–5.1)
SODIUM: 142 mmol/L (ref 135–145)
Total Bilirubin: 0.6 mg/dL (ref 0.3–1.2)
Total Protein: 6.9 g/dL (ref 6.5–8.1)

## 2018-03-03 LAB — CBC
HCT: 42.3 % (ref 36.0–46.0)
HEMOGLOBIN: 13.2 g/dL (ref 12.0–15.0)
MCH: 28.6 pg (ref 26.0–34.0)
MCHC: 31.2 g/dL (ref 30.0–36.0)
MCV: 91.6 fL (ref 78.0–100.0)
PLATELETS: 311 10*3/uL (ref 150–400)
RBC: 4.62 MIL/uL (ref 3.87–5.11)
RDW: 12.8 % (ref 11.5–15.5)
WBC: 6.4 10*3/uL (ref 4.0–10.5)

## 2018-03-03 LAB — LIPASE, BLOOD: LIPASE: 43 U/L (ref 11–51)

## 2018-03-03 LAB — I-STAT BETA HCG BLOOD, ED (MC, WL, AP ONLY): I-stat hCG, quantitative: <5 m[IU]/mL (ref <5)

## 2018-03-03 LAB — I-STAT TROPONIN, ED: TROPONIN I, POC: 0 ng/mL (ref 0.00–0.08)

## 2018-03-03 LAB — TSH: TSH: 1.849 u[IU]/mL (ref 0.350–4.500)

## 2018-03-03 MED ORDER — ONDANSETRON HCL 4 MG/2ML IJ SOLN
4.0000 mg | Freq: Once | INTRAMUSCULAR | Status: AC | PRN
  Administered 2018-03-03: 4 mg via INTRAVENOUS
  Filled 2018-03-03: qty 2

## 2018-03-03 MED ORDER — SODIUM CHLORIDE 0.9 % IV BOLUS
1000.0000 mL | Freq: Once | INTRAVENOUS | Status: AC
  Administered 2018-03-03: 1000 mL via INTRAVENOUS

## 2018-03-03 MED ORDER — LORAZEPAM 2 MG/ML IJ SOLN
0.5000 mg | Freq: Once | INTRAMUSCULAR | Status: AC
  Administered 2018-03-03: 0.5 mg via INTRAVENOUS
  Filled 2018-03-03: qty 1

## 2018-03-03 NOTE — ED Triage Notes (Signed)
Pt reports Chest pain, emesis (X5), SOB, weakness and "pain all over my body."  Pt admits to hx of anxiety, panic attacks, and is being treated for Post-parium depression.  Pt's biggest complaint is the "pain thought-out the body."

## 2018-03-03 NOTE — ED Notes (Signed)
ED Provider at bedside. 

## 2018-03-03 NOTE — Discharge Instructions (Signed)
Please read and follow all provided instructions.  Your diagnoses today include:  1. Myalgia   2. Postpartum depression     Tests performed today include:  Blood counts and electrolytes  Thyroid stimulating hormone -normal  Blood test for heart irritation - normal  EKG and chest x-ray - normal  Urine test -shows some white blood cells, culture pending  Vital signs. See below for your results today.   Medications prescribed:   None  Take any prescribed medications only as directed.  Home care instructions:  Follow any educational materials contained in this packet.  Follow-up instructions: Please follow-up with your primary care provider as planned for further evaluation of your symptoms.   Return instructions:   Please return to the Emergency Department if you experience worsening symptoms.   Please return if you have any other emergent concerns.  Additional Information:  Your vital signs today were: BP 110/72    Pulse 78    Temp 98.3 F (36.8 C) (Oral)    Resp 12    Ht 5\' 9"  (1.753 m)    Wt 90.7 kg (200 lb)    LMP 05/13/2017    SpO2 97%    BMI 29.53 kg/m  If your blood pressure (BP) was elevated above 135/85 this visit, please have this repeated by your doctor within one month. --------------

## 2018-03-03 NOTE — ED Notes (Signed)
Patient transported to X-ray 

## 2018-03-03 NOTE — ED Provider Notes (Signed)
[facility] [facility] EMERGENCY DEPARTMENT Provider Note   CSN: [id number] Arrival date & time: 03/03/18  0532     History   Chief Complaint Chief Complaint  Patient presents with  . Chest Pain  . Emesis    HPI Brandy [name] is a 32 y.o. female.  Patient who is 4 weeks post-partum presents with complaint of aching pain throughout her entire body.  Patient has headache pain and pain that goes down her entire body into her fingers and toes.  She does not have focal pain that is worse in one location.  She describes the pain as aching in nature.  It is not made better or worse with movement.  Pain began approximately 4 days ago.  She has not had any associated fevers, URI symptoms, neck stiffness.  Pain does include her chest and she states that she has shortness of breath.  Nothing makes the shortness of breath better or worse.  She does feel lightheaded when she stands up but does not have any episodes of full syncope.  She reports multiple episodes of nausea and vomiting starting yesterday.  Vomitus is nonbloody, nonbilious.  Reports previous cholecystectomy, no other abdominal surgeries. She states that she has about baseline vaginal discharge but no vaginal bleeding.  She denies any dysuria, hematuria, increased frequency urgency.  No skin rashes.  Patient only is currently on Seroquel, Xanax, Prozac.  She has not abruptly stopped any of her medications recently but does note that she has not taken Seroquel in 2 days.  Patient does note that she was diagnosed with an enlarged pituitary gland about 6 years ago.  Patient was recently evaluated for headaches during pregnancy.  She had a repeat MRI showing mildly enlarged pituitary gland as described below.  She followed up with ophthalmology who did not find any acute eye issues.  She then followed up with endocrinology who recommended repeat imaging 6 weeks postpartum as below.  Patient seems to be very anxious about this.  She  requests repeat MRI today and wonders if her full body symptoms could be related to her enlarged pituitary gland.  She requests hormone testing.   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 There is no evidence of hemorrhage. It is very mildly abutting the optic chiasm. Lower on the differential is lymphocytic hypophysitis, especially given the original scan shows enlargement in 2013 and given the hormone levels are all elevated rather than any evidence of pan hypo-pit. In light of this, we believe there is no role for steroids to treat the headache. Also lower in the differential is normal variant in pregnancy. As such, there is no special preparation needed for labor/delivery, with regards to the pituitary. Recommend continued supportive management for the headache. Consensus recommendation for repeat imaging following delivery, 6 weeks postpartum and consultation with Dr. Salomon Fick can be arranged for the same day. I have left a message for the patient to contact and update.   Ophthalmology exams 5/15-16 reassuring --> referral to endocrinology.    MRI 12/23/17: 1. Pituitary gland is mildly enlarged at 8.5 x 12 x 13 mm. Is uplifting of the optic chiasm is concern for small adenoma as opposed to usual mild enlargement from pregnancy (less than 10 mm in height is upper limits of normal during pregnancy and 12 mm  in the postpartum period).  2. Moderate T2 signal in the posterior cerebral white matter and questionable/borderline hyperintensity in the posterior hemispheres particularly occipital and temporal regions. No diffusion restriction. Correlate for preeclampsia and clinical follow-up to exclude development of PRES.   Past Medical History:  Diagnosis Date  . Anxiety   . Depression   . Gallstones   . History of  nephrolithiasis   . Kidney stone   . Migraine   . Pituitary tumor   . Vitamin D deficiency     Patient Active Problem List   Diagnosis Date Noted  . SVD (spontaneous vaginal delivery) 02/02/2018  . Postpartum care following vaginal delivery 02/02/2018  . Headache in pregnancy, third trimester 01/31/2018  . Class 1 obesity with body mass index (BMI) of 31.0 to 31.9 in adult 05/04/2017  . Nausea without vomiting 02/18/2017  . Abdominal pain, epigastric 02/18/2017  . Vitamin D deficiency 07/21/2016  . B12 deficiency 07/21/2016  . Generalized anxiety disorder 06/09/2016  . Anterior neck pain 12/30/2015  . Enlarged thyroid gland 12/30/2015  . Fatigue 12/30/2015  . Routine general medical examination at a health care facility 09/27/2014  . Tendinitis of right ankle 08/14/2014  . Nausea vomiting and diarrhea 12/28/2013  . Serum calcium elevated 12/28/2013  . 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 DUB (dysfunctional uterine bleeding) 06/04/2011  . GERD (gastroesophageal reflux disease) 02/19/2011  . Depression 09/06/2008  . NEPHROLITHIASIS, HX OF 12/16/2007    Past Surgical History:  Procedure Laterality Date  . CHOLECYSTECTOMY  2014  . [place], 2004  . MOUTH SURGERY    . TYMPANOSTOMY TUBE PLACEMENT       OB History    Gravida  3   Para  1   Term  1   Preterm  0   AB  1   Living  1     SAB  1   TAB  0   Ectopic  0   Multiple  0   Live Births  1        Obstetric Comments  Age first period 43         Home Medications    Prior to Admission medications   Medication Sig Start Date End Date Taking? Authorizing Provider  acetaminophen (TYLENOL) 500 MG tablet Take 500 mg by mouth every 6 (six) hours as needed.    [provider]  ALPRAZolam Duanne Moron) 0.25 MG tablet Xanax 0.25 mg tablet  Take 1 tablet twice a day by oral route as needed. 02/07/18   [place], Betty G, MD  baclofen (LIORESAL) 10 MG tablet Take 1 tablet (10 mg total) by mouth 3  (three) times daily as needed for muscle spasms. 02/07/18   [place], Betty G, MD  cyclobenzaprine (FLEXERIL) 5 MG tablet Take 1 tablet (5 mg total) by mouth 3 (three) times daily as needed for muscle spasms. 02/04/18   Meisinger, Sherren Mocha, MD  ibuprofen (ADVIL,MOTRIN) 600 MG tablet Take 1 tablet (600 mg total) by mouth every 6 (six) hours. 02/04/18   Meisinger, Todd, MD  ondansetron (ZOFRAN-ODT) 8 MG disintegrating tablet DISSOLVE 1 TABLET IN MOUTH TWICE DAILY AS NEEDED 01/28/18   [provider]  oxyCODONE (OXY IR/ROXICODONE) 5 MG immediate release tablet TAKE 1 TABLET BY MOUTH EVERY 4 HOURS AS NEEDED FOR UP TO 3 DAYS FOR SEVERE PAIN. 01/29/18   [provider]  Prenatal Vit-Fe Fumarate-FA (PRENATAL MULTIVITAMIN) TABS tablet Take 1 tablet by mouth daily at 12 noon.    [provider]  ranitidine (  ZANTAC) 150 MG capsule Take 150 mg by mouth daily.    [provider]  sertraline (ZOLOFT) 50 MG tablet Take 1 tablet (50 mg total) by mouth daily. 02/04/18   Meisinger, Sherren Mocha, MD    Family History Family History  Problem Relation Age of Onset  . Diabetes Mother   . Mental illness Mother   . Asthma Mother   . Stomach cancer Mother   . Colon polyps Mother   . Heart disease Mother   . Hypertension Mother   . Arthritis Mother   . Diabetes Father   . Hypertension Father   . Colon cancer Maternal Grandmother   . Diabetes Paternal Grandmother   . Breast cancer Maternal Aunt   . Lung cancer Maternal Aunt   . Diabetes Paternal Aunt   . Diabetes Maternal Uncle   . Diabetes Maternal Uncle     Social History Social History   Tobacco Use  . Smoking status: Never Smoker  . Smokeless tobacco: Never Used  Substance Use Topics  . Alcohol use: Not Currently    Comment: social-1 every couple of months  . Drug use: No     Allergies   Penicillins   Review of Systems Review of Systems  Constitutional: Negative for diaphoresis and fever.  HENT: Negative for rhinorrhea and  sore throat.   Eyes: Negative for redness.  Respiratory: Negative for cough and shortness of breath.   Cardiovascular: Positive for chest pain. Negative for palpitations and leg swelling.  Gastrointestinal: Positive for abdominal pain, nausea and vomiting. Negative for diarrhea.  Genitourinary: Negative for dysuria.  Musculoskeletal: Positive for back pain, myalgias and neck pain.  Skin: Negative for rash.  Neurological: Positive for light-headedness and headaches. Negative for syncope.  Psychiatric/Behavioral: The patient is nervous/anxious.      Physical Exam Updated Vital Signs BP 125/90 (BP Location: Right Arm)   Pulse 90   Temp 98.3 F (36.8 C) (Oral)   Resp 16   Ht 5\' 9"  (1.753 m)   Wt 90.7 kg (200 lb)   LMP 05/13/2017   SpO2 100%   BMI 29.53 kg/m   Physical Exam  Constitutional: She appears well-developed and well-nourished.  HENT:  Head: Normocephalic and atraumatic.  Eyes: Pupils are equal, round, and reactive to light. Conjunctivae are normal. Right eye exhibits no discharge. Left eye exhibits no discharge.  Neck: Normal range of motion. Neck supple.  Cardiovascular: Normal rate, regular rhythm and normal heart sounds.  Pulmonary/Chest: Effort normal and breath sounds normal. She has no decreased breath sounds.  Abdominal: Soft. There is no tenderness.  Neurological: She is alert.  Skin: Skin is warm and dry.  Psychiatric: Her speech is normal and behavior is normal. Her mood appears anxious. She expresses no suicidal ideation.  Nursing note and vitals reviewed.    ED Treatments / Results  Labs (all labs ordered are listed, but only abnormal results are displayed) Labs Reviewed  COMPREHENSIVE METABOLIC PANEL - Abnormal; Notable for the following components:      Result Value   Glucose, Bld 119 (*)    Creatinine, Ser 1.14 (*)    All other components within normal limits  URINALYSIS, ROUTINE W REFLEX MICROSCOPIC - Abnormal; Notable for the following  components:   Hgb urine dipstick MODERATE (*)    Leukocytes, UA TRACE (*)    All other components within normal limits  URINE CULTURE  CBC  LIPASE, BLOOD  TSH  I-STAT TROPONIN, ED  I-STAT BETA HCG BLOOD, ED ([facility],  [facility], [facility] ONLY)    ED ECG REPORT   Date: 03/03/2018  Rate: 83  Rhythm: normal sinus rhythm  QRS Axis: normal  Intervals: normal  ST/T Wave abnormalities: normal  Conduction Disutrbances:none  Narrative Interpretation:   Old EKG Reviewed: unchanged from 02/11/17  I have personally reviewed the EKG tracing and agree with the computerized printout as noted.  Radiology Dg Chest 2 View  Result Date: 03/03/2018 CLINICAL DATA:  Chest pain, vomiting, shortness of breath, and weakness. EXAM: CHEST - 2 VIEW COMPARISON:  01/23/2017 FINDINGS: The heart size and mediastinal contours are within normal limits. Both lungs are clear. The visualized skeletal structures are unremarkable. IMPRESSION: No active cardiopulmonary disease. Electronically Signed   By: Lucienne Capers M.D.   On: 03/03/2018 06:20    Procedures Procedures (including critical care time)  Medications Ordered in ED Medications  ondansetron [facility]) injection 4 mg (4 mg Intravenous Given 03/03/18 0618)  sodium chloride 0.9 % bolus 1,000 mL (0 mLs Intravenous Stopped 03/03/18 0815)  LORazepam (ATIVAN) injection 0.5 mg (0.5 mg Intravenous Given 03/03/18 0160)     Initial Impression / Assessment and Plan / ED Course  I have reviewed the triage vital signs and the nursing notes.  Pertinent labs & imaging results that were available during my care of the patient were reviewed by me and considered in my medical decision making (see chart for details).     Patient seen and examined. Work-up initiated. I reviewed patient records including recent MRI and follow-up. Her generalized symptoms are not likely related to her pituitary adenoma and I do not feel that repeat imaging today is indicated.  She has no focal neurological  deficits on exam.  Added TSH at patient request.  Will hydrate and check orthostatics while other labs result.  Chest x-ray and EKG reviewed.  Patient's exam is essentially normal.  Vital signs reviewed and are as follows: BP 125/90 (BP Location: Right Arm)   Pulse 90   Temp 98.3 F (36.8 C) (Oral)   Resp 16   Ht 5\' 9"  (1.753 m)   Wt 90.7 kg (200 lb)   LMP 05/13/2017   SpO2 100%   BMI 29.53 kg/m   9:19 AM Pt updated on results. We discussed at length. Pt, her husband, and myself discussed depression and that it could explain her somatic symptoms -- however I agree that other medical causes need to be explored.   She has follow-up with her primary care doctor tomorrow.  I have encouraged her to keep this appointment.  Encouraged return to the emergency department with worsening or changing symptoms.  Final Clinical Impressions(s) / ED Diagnoses   Final diagnoses:  Myalgia  Postpartum depression   Patient with generalized body pain, headaches --ongoing prior to the birth of her child however worse recently.  She is extremely anxious.  She is undergoing treatment for postpartum depression.  Lab work-up here is essentially unremarkable.  There is a urine culture pending.  Cardiac work-up is negative.  Mild elevation in creatinine treated with IV fluids.  She is not orthostatic here.  Suspect that her depression is contributing to her somatic symptoms, however she is currently in the midst of a medical work-up to rule out other causes.  No dangerous or life-threatening conditions suspected or identified by history, physical exam, and by work-up. No indications for hospitalization identified.    ED Discharge Orders    None       Carlisle Cater, [place] 03/03/18 1093  Veryl Speak, MD 03/03/18 2258

## 2018-03-04 ENCOUNTER — Encounter: Payer: Self-pay | Admitting: Family Medicine

## 2018-03-04 ENCOUNTER — Ambulatory Visit: Payer: 59 | Admitting: Family Medicine

## 2018-03-04 VITALS — Temp 98.6°F | Resp 12 | Ht 69.0 in | Wt 200.1 lb

## 2018-03-04 DIAGNOSIS — R002 Palpitations: Secondary | ICD-10-CM

## 2018-03-04 DIAGNOSIS — R030 Elevated blood-pressure reading, without diagnosis of hypertension: Secondary | ICD-10-CM

## 2018-03-04 DIAGNOSIS — E559 Vitamin D deficiency, unspecified: Secondary | ICD-10-CM | POA: Diagnosis not present

## 2018-03-04 DIAGNOSIS — R51 Headache: Secondary | ICD-10-CM | POA: Diagnosis not present

## 2018-03-04 DIAGNOSIS — F411 Generalized anxiety disorder: Secondary | ICD-10-CM

## 2018-03-04 DIAGNOSIS — R208 Other disturbances of skin sensation: Secondary | ICD-10-CM | POA: Diagnosis not present

## 2018-03-04 DIAGNOSIS — R5383 Other fatigue: Secondary | ICD-10-CM

## 2018-03-04 DIAGNOSIS — R519 Headache, unspecified: Secondary | ICD-10-CM

## 2018-03-04 DIAGNOSIS — R112 Nausea with vomiting, unspecified: Secondary | ICD-10-CM | POA: Diagnosis not present

## 2018-03-04 DIAGNOSIS — R5381 Other malaise: Secondary | ICD-10-CM

## 2018-03-04 LAB — VITAMIN B12: Vitamin B-12: 352 pg/mL (ref 211–911)

## 2018-03-04 LAB — VITAMIN D 25 HYDROXY (VIT D DEFICIENCY, FRACTURES): VITD: 22.47 ng/mL — AB (ref 30.00–100.00)

## 2018-03-04 MED ORDER — PROMETHAZINE HCL 25 MG PO TABS: 25.0000 mg | ORAL_TABLET | Freq: Three times a day (TID) | ORAL | 0 refills | Status: DC | PRN

## 2018-03-04 NOTE — Progress Notes (Signed)
ACUTE VISIT   HPI:  Chief Complaint  Patient presents with  . Hypertension    sx started 4 days ago in the mornings after exertion  . Elevated heart rate  . Emesis  . Excessive Sweating  . Feeling faint    [name] is a 32 y.o. female, who is here today with her mother-in-law complaining of 4 days of severe fatigue, myalgias, arthralgias, nausea, and vomiting.  I saw her last on 02/07/2018 for headache,anxiety,and neck pain.  She states that she feels fine when she first gets up in the morning, symptoms usually start 30 to 40 minutes after breakfast: Diaphoresis, dizziness, nausea, vomiting, palpitations, chest pain, and shortness of breath.  Today she checked her BP, 195/117 and her HR was 141/minute. Her mother has a history of diabetes, she has been checking her BS and has been in the 90's. Negative for new medications, including OTC herbs or supplements. No changes in her diet. Nausea and vomiting are exacerbated by food intake. No associated abdominal pain no changes in bowel habits.  She was evaluated yesterday due to similar symptoms, lab work was done, otherwise negative. Zofran is not helping with nausea or vomiting.  She has had some heartburn and burping, intermittently. Mid chest pain, pressure sensation.  Pain is not radiated  it is not related with exertion.  Constant frontal, bitemporal, left retro-ocular, and occipital pressure headache"busting." Also having left-sided neck pain.  She has had headaches since late pregnancy, getting worse. Last visit I recommended trying baclofen 10 mg, according to patient, her mother does not want her to take this medication.  Head and neck MRA on 01/05/18 Negative noncontrast MRA of the intracranial and extracranial circulation. No stenosis, occlusion, or dissection.  She wonders if the symptoms could be related to pituitary adenoma or something in her cervical spine. "Something is not right." She feels  like she is going to die and she is "so afraid of dying." Burning sensation on her whole body. She has not noted a rash. No focal deficit.  Due to persistent headaches she had head MRI during pregnancy, 12/23/2017.  1. Pituitary gland is mildly enlarged at 8.5 x 12 x 13 mm. Is uplifting of the optic chiasm is concern for small adenoma as opposed to usual mild enlargement from pregnancy (less than 10 mm in height is upper limits of normal during pregnancy and 12 mm  in the postpartum period). 2. Moderate T2 signal in the posterior cerebral white matter and questionable/borderline hyperintensity in the posterior hemispheres particularly occipital and temporal regions. No diffusion restriction.   Head CT WO contrast was obtained on 02/06/2018 to follow on headache and pubic cardiac changes.  .Calvarium/skull base: No evidence of acute fracture or destructive lesion. Mastoids and middle ears demonstrate no substantial disease.  .Paranasal sinuses: No air-fluid levels or substantial mucosal disease.   .Brain: No evidence of acute large vascular territory infarct.No mass effect or hydrocephalus. No acute hemorrhage. There is unchanged mild prominence of the pituitary gland, which may be physiologic in a patient of this age.   She has some intermittent blurry vision but denies hemianopsia. Negative for nipple discharge. She is not breast-feeding.   She has an appointment with her endocrinologist on 03/18/18, planning on repeating brain MRI to follow on pituitary changes. Thyroid US was also ordered by her endocrinologist to evaluate enlarged thyroid found on examination. 03/03/2018 thyroid US showed left thyroid nodule, annual follow-up was recommended.   She has an  appointment with neurologist in 03/2018.  Lab Results  Component Value Date   WBC 6.4 03/03/2018   HGB 13.2 03/03/2018   HCT 42.3 03/03/2018   MCV 91.6 03/03/2018   PLT 311 03/03/2018   Lab Results  Component Value Date     CREATININE 1.14 (H) 03/03/2018   BUN 9 03/03/2018   NA 142 03/03/2018   K 3.9 03/03/2018   CL 108 03/03/2018   CO2 24 03/03/2018   Lab Results  Component Value Date   TSH 1.849 03/03/2018   Lab Results  Component Value Date   ALT 30 03/03/2018   AST 21 03/03/2018   ALKPHOS 65 03/03/2018   BILITOT 0.6 03/03/2018   Anxiety and depression: Currently she is on Prozac 40 mg daily and Xanax 0.5 mg twice daily as needed. Last visit she asked me to continue managing her anxiety and depression, her gynecologist had started her on sertraline, which was increased from 25 to 50 mg last visit.  I also gave her Xanax 0.25 mg to take daily as needed. Since her last visit she has established with psychiatrist, medications were changed.  She states that she is having "owful panic attacks" "back to back" through the day. She is staying with her mother, which is helping with her baby care.  She is frustrated because she feels like she cannot take care of her daughter.   She is asking how much Xanax she could take during the day. Negative for suicidal thoughts.  Next appointment with psychiatrist on 03/22/2018.  History of vitamin D deficiency, currently she is not on supplementation. She wonders if her generalized myalgias and joint pain are related to this problem. She has not noted joint edema or erythema. No limitation in ROM but pain is exacerbated by movement and alleviated by rest.   Review of Systems  Constitutional: Positive for activity change, appetite change, diaphoresis and fatigue. Negative for fever and unexpected weight change.  HENT: Negative for mouth sores, nosebleeds and trouble swallowing.   Eyes: Negative for redness and visual disturbance.  Respiratory: Positive for shortness of breath. Negative for cough and wheezing.   Cardiovascular: Positive for chest pain and palpitations. Negative for leg swelling.  Gastrointestinal: Positive for nausea and vomiting. Negative  for abdominal pain.       Negative for changes in bowel habits.  Endocrine: Negative for cold intolerance and heat intolerance.  Genitourinary: Negative for decreased urine volume, dysuria and hematuria.  Musculoskeletal: Positive for arthralgias, back pain, myalgias and neck pain. Negative for joint swelling.  Skin: Negative for rash and wound.  Neurological: Positive for dizziness and headaches. Negative for seizures, syncope and weakness.  Psychiatric/Behavioral: Positive for sleep disturbance. Negative for confusion. The patient is nervous/anxious.       Current Outpatient Medications on File Prior to Visit  Medication Sig Dispense Refill  . ALPRAZolam (XANAX) 0.5 MG tablet Take 0.5 mg by mouth 2 (two) times daily.    Marland Kitchen FLUoxetine (PROZAC) 40 MG capsule Take 40 mg by mouth daily.    . Omega-3 Fatty Acids (FISH OIL) 1000 MG CAPS Take 1,000 mg by mouth daily.    . Prenatal Vit-Fe Fumarate-FA (PRENATAL MULTIVITAMIN) TABS tablet Take 1 tablet by mouth daily at 12 noon.     No current facility-administered medications on file prior to visit.      Past Medical History:  Diagnosis Date  . Anxiety   . Depression   . Gallstones   . History of nephrolithiasis   .  Kidney stone   . Migraine   . Pituitary tumor   . Vitamin D deficiency    Allergies  Allergen Reactions  . Penicillins Hives and Other (See Comments)    Childhood reaction Has patient had a PCN reaction causing immediate rash, facial/tongue/throat swelling, SOB or lightheadedness with hypotension: No Has patient had a PCN reaction causing severe rash involving mucus membranes or skin necrosis: No Has patient had a PCN reaction that required hospitalization No Has patient had a PCN reaction occurring within the last 10 years: No If all of the above answers are "NO", then may proceed with Cephalosporin use.    Family History  Problem Relation Age of Onset  . Diabetes Mother   . Mental illness Mother   . Asthma Mother    . Stomach cancer Mother   . Colon polyps Mother   . Heart disease Mother   . Hypertension Mother   . Arthritis Mother   . Diabetes Father   . Hypertension Father   . Colon cancer Maternal Grandmother   . Diabetes Paternal Grandmother   . Breast cancer Maternal Aunt   . Lung cancer Maternal Aunt   . Diabetes Paternal Aunt   . Diabetes Maternal Uncle   . Diabetes Maternal Uncle      Social History   Socioeconomic History  . Marital status: Married    Spouse name: Not on file  . Number of children: 0  . Years of education: Not on file  . Highest education level: Not on file  Occupational History  . Occupation: Consulting civil engineer  Social Needs  . Financial resource strain: Not on file  . Food insecurity:    Worry: Not on file    Inability: Not on file  . Transportation needs:    Medical: Not on file    Non-medical: Not on file  Tobacco Use  . Smoking status: Never Smoker  . Smokeless tobacco: Never Used  Substance and Sexual Activity  . Alcohol use: Not Currently    Comment: social-1 every couple of months  . Drug use: No  . Sexual activity: Yes  Lifestyle  . Physical activity:    Days per week: Not on file    Minutes per session: Not on file  . Stress: Not on file  Relationships  . Social connections:    Talks on phone: Not on file    Gets together: Not on file    Attends religious service: Not on file    Active member of club or organization: Not on file    Attends meetings of clubs or organizations: Not on file    Relationship status: Not on file  Other Topics Concern  . Not on file  Social History Narrative  . Not on file     Vitals:   03/04/18 1147  Resp: 12  Temp: 98.6 F (37 C)  SpO2: 98%   Body mass index is 29.55 kg/m.   Physical Exam  Nursing note and vitals reviewed. Constitutional: She is oriented to person, place, and time. She appears well-developed. No distress.  HENT:  Head: Normocephalic and atraumatic.  Mouth/Throat:  Oropharynx is clear and moist. Mucous membranes are dry.  Eyes: Pupils are equal, round, and reactive to light. Conjunctivae are normal.  Neck: No tracheal deviation present. Thyromegaly present. No thyroid mass present.  Cardiovascular: Normal rate and regular rhythm.  No murmur heard. Pulses:      Dorsalis pedis pulses are 2+ on the right side, and  2+ on the left side.  Respiratory: Effort normal and breath sounds normal. No respiratory distress. She exhibits tenderness.  GI: Soft. She exhibits no mass. There is no hepatomegaly. There is no tenderness.  Musculoskeletal: She exhibits no edema.  Tender points with palpation of upper and mid back, chest wall, and upper extremities. No deformities, limitation of ROM, or signs of synovitis.  Lymphadenopathy:    She has no cervical adenopathy.  Neurological: She is alert and oriented to person, place, and time. She has normal strength. Gait normal.  Reflex Scores:      Bicep reflexes are 2+ on the right side and 2+ on the left side.      Patellar reflexes are 2+ on the right side and 2+ on the left side. Skin: Skin is warm. No rash noted. No erythema.  Psychiatric: Her mood appears anxious. Her affect is labile. She expresses no suicidal ideation.  Fairly groomed, good eye contact.      ASSESSMENT AND PLAN:   Ms. Brandy [name] was seen today for hypertension, elevated heart rate, emesis, excessive sweating and feeling faint.  Diagnoses and all orders for this visit:  Malaise and fatigue  Possible etiologies discussed. Examination today does not suggest a serious process at the time. So far work-up has been otherwise negative. She has appt with endocrinologist in a few days.  Vitamin D deficiency -     VITAMIN D 25 Hydroxy (Vit-D Deficiency, Fractures)  Burning sensation  She describes symptom as generalized burning sensation.  ?Hot flash.  -     Vitamin B12  Elevated blood pressure reading  Associated with diaphoresis,N/V, and  tachycardia. Today here in the office BP in normal range, checked 3.  -     Aldosterone + renin activity w/ ratio  -     Metanephrines, urine, 24 hour -     Catecholamines, Frac w/VMA, 24H Ur  Nausea and vomiting in adult  Zofran is not helping. Phenergan 25 mg tid prn recommended ,so side effects discussed. Frequent and small sips of clear fluids.  -     promethazine (PHENERGAN) 25 MG tablet; Take 1 tablet (25 mg total) by mouth every 8 (eight) hours as needed for up to 7 days for nausea or vomiting.  Palpitation  Auscultation with rhythm variation, EKG yesterday showed sinus arrhythmia. ? Anxiety. Adequate hydration. She would like appt with cardiologist, referral placed.  -     Ambulatory referral to Cardiology  Generalized anxiety disorder  + Depression. Problem is not well controlled. Medication recently adjusted. Some side effects of benzodiazepines discussed. For now I do not recommend increasing dose of Xanax,she can discuss this with her psychiatrist.  Headache, unspecified headache type  Neurologic exam today negative. ? Tension headache, migraine. Instructed about warning signs. Keep appt with neuro.   Return in about 1 month (around 04/01/2018).   > 40 min face to face OV. > 50% was dedicated to discussion of differential Dx, prognosis, treatment options, and some side effects of medications as well as coordination of care.. She is very anxious and most of the time was spent on discussing symptoms and signs of each possible diagnoses that could explain her symptomatology. Some of her symptoms she has reported in the past. I tried to reassure her, physical examination negative otherwise but she is still convinced that "something is wrong." . There is a possibility that most of her symptoms are related to anxiety.     Revonda Menter G. [place], MD  [facility] [place] office.

## 2018-03-04 NOTE — Patient Instructions (Addendum)
A few things to remember from today's visit:   Malaise and fatigue  Vitamin D deficiency - Plan: VITAMIN D 25 Hydroxy (Vit-D Deficiency, Fractures)  Burning sensation - Plan: Vitamin B12  Hypertension, unspecified type - Plan: Aldosterone + renin activity w/ ratio, Metanephrines, urine, 24 hour, Catecholamine+VMA, 24-Hr Urine  Nausea and vomiting in adult - Plan: promethazine (PHENERGAN) 25 MG tablet  Palpitation - Plan: Ambulatory referral to Cardiology  Keep your appointment with endocrinologist. Continue monitoring blood pressure. For now I recommend resting and adequate hydration.  Please call your psychiatrist to see if they can see you sooner or if they can adjust her medications.    Please be sure medication list is accurate. If a new problem present, please set up appointment sooner than planned today.

## 2018-03-05 ENCOUNTER — Encounter: Payer: Self-pay | Admitting: Family Medicine

## 2018-03-05 DIAGNOSIS — R519 Headache, unspecified: Secondary | ICD-10-CM

## 2018-03-05 DIAGNOSIS — R51 Headache: Secondary | ICD-10-CM

## 2018-03-05 HISTORY — DX: Headache, unspecified: R51.9

## 2018-03-05 LAB — URINE CULTURE: Culture: 20000 — AB

## 2018-03-06 ENCOUNTER — Telehealth: Payer: Self-pay

## 2018-03-06 ENCOUNTER — Emergency Department (HOSPITAL_COMMUNITY)
Admission: EM | Admit: 2018-03-06 | Discharge: 2018-03-07 | Disposition: A | Discharge status: Home / Self Care | Payer: 59 | Attending: Emergency Medicine | Admitting: Emergency Medicine

## 2018-03-06 ENCOUNTER — Encounter (HOSPITAL_COMMUNITY): Payer: Self-pay | Admitting: Emergency Medicine

## 2018-03-06 DIAGNOSIS — D352 Benign neoplasm of pituitary gland: Secondary | ICD-10-CM | POA: Insufficient documentation

## 2018-03-06 DIAGNOSIS — G43909 Migraine, unspecified, not intractable, without status migrainosus: Secondary | ICD-10-CM | POA: Insufficient documentation

## 2018-03-06 DIAGNOSIS — R4689 Other symptoms and signs involving appearance and behavior: Secondary | ICD-10-CM | POA: Diagnosis not present

## 2018-03-06 DIAGNOSIS — R112 Nausea with vomiting, unspecified: Secondary | ICD-10-CM

## 2018-03-06 DIAGNOSIS — R531 Weakness: Secondary | ICD-10-CM | POA: Diagnosis not present

## 2018-03-06 LAB — URINALYSIS, ROUTINE W REFLEX MICROSCOPIC
Bilirubin Urine: NEGATIVE
Glucose, UA: NEGATIVE mg/dL
KETONES UR: 20 mg/dL — AB
Nitrite: NEGATIVE
PROTEIN: NEGATIVE mg/dL
Specific Gravity, Urine: 1.004 — ABNORMAL LOW (ref 1.005–1.030)
pH: 7 (ref 5.0–8.0)

## 2018-03-06 LAB — CBC
HCT: 40 % (ref 36.0–46.0)
HEMOGLOBIN: 12.7 g/dL (ref 12.0–15.0)
MCH: 28.9 pg (ref 26.0–34.0)
MCHC: 31.8 g/dL (ref 30.0–36.0)
MCV: 90.9 fL (ref 78.0–100.0)
Platelets: 331 10*3/uL (ref 150–400)
RBC: 4.4 MIL/uL (ref 3.87–5.11)
RDW: 12.4 % (ref 11.5–15.5)
WBC: 8.9 10*3/uL (ref 4.0–10.5)

## 2018-03-06 LAB — I-STAT BETA HCG BLOOD, ED (MC, WL, AP ONLY)

## 2018-03-06 LAB — COMPREHENSIVE METABOLIC PANEL
ALK PHOS: 62 U/L (ref 38–126)
ALT: 31 U/L (ref 0–44)
ANION GAP: 11 (ref 5–15)
AST: 19 U/L (ref 15–41)
Albumin: 4 g/dL (ref 3.5–5.0)
BILIRUBIN TOTAL: 0.6 mg/dL (ref 0.3–1.2)
BUN: <5 mg/dL — ABNORMAL LOW (ref 6–20)
CALCIUM: 9.5 mg/dL (ref 8.9–10.3)
CO2: 22 mmol/L (ref 22–32)
Chloride: 106 mmol/L (ref 98–111)
Creatinine, Ser: 0.99 mg/dL (ref 0.44–1.00)
GLUCOSE: 106 mg/dL — AB (ref 70–99)
POTASSIUM: 3.5 mmol/L (ref 3.5–5.1)
Sodium: 139 mmol/L (ref 135–145)
TOTAL PROTEIN: 7.3 g/dL (ref 6.5–8.1)

## 2018-03-06 LAB — LIPASE, BLOOD: Lipase: 40 U/L (ref 11–51)

## 2018-03-06 NOTE — ED Notes (Signed)
Pt husband just exited triage room requesting to know what is going on. Once again explained the triage process to him.

## 2018-03-06 NOTE — ED Notes (Addendum)
Pts mother stepped into the hallway requesting someone to come into a room. This EMT stepped in. Pt was moving around in recliner, visibly upset but alert and oriented. This EMT asked the pt what is hurting and what is going on. Pt said please help. Mother was agitated and stated to this EMT that she is saying please help me. This EMT stated I am aware but it is important for me to talk to the patient as much as possible. Mother began to get visibly distressed telling this EMT to do something. This EMT stated that it is important for both the patient and her to remain calm.  The pt began pointing at head and neck and I asked if it was hurting. The mother yelled at this EMT that she is pointing to her neck and back of her head. This EMT stated "I know." The mother then stated in an elevated, agitated tone, "Look. This is my daughter and I have a college degree so I don't need you coming in here talking down to me like you're smarter than me. This is my daughter and I have been dealing with this for 4 weeks now." This EMT stated, "Ma'am I understand that this situation is very stressful for you and I apologize if I have come across as condescending but I do not appreciate the way you are speaking to me right now." The mother continued, "just please do your damn job now." This EMT stepped into the hall to inform the triage nurse of the situation.

## 2018-03-06 NOTE — ED Notes (Signed)
Pt mother steps out of room and looks at EMT stating "are you not going to do anything about this" I informed mother I would be in shortly to reassess pt, I was in the process of triaging someone else. Upon entering room pt sitting in chair stating she is unable to speak, pt rocking back and forth in chair appears to be having an anxiety attack. Pt mother began to yell at this RN stating she is going to "sue this damn hospital because her daughter is having an emergency and we arent doing anything" Informed pt and her mother they are still waiting for a room in the back and her VSS, blood work, urine that has resulted thus far is reassuring. Pt mother then requests my name so she "can sue me"

## 2018-03-06 NOTE — ED Notes (Signed)
Pt husband comes out of room stating "you need to do something her eyes are rolling back in her head" Upon entering room pt laying back in chair eyes rolled back, eyelids twitching. Pinched pt and pt immediately woke up and stopped twitching. Asked her what is going on and she said "I dont know"

## 2018-03-06 NOTE — Telephone Encounter (Signed)
No further treatment for UC fromED 03/03/18 per M. Lovena Neighbours D

## 2018-03-06 NOTE — ED Triage Notes (Signed)
Per EMS pt c/o N/V, weakness with joint pain, also pain in neck and behind eyes been going on for 4 days. Pt was diagnosed with postpartum depression on seroquel and prozac. Pt stopped taking seroquel last week. 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

## 2018-03-06 NOTE — ED Notes (Signed)
Pt husband demanding an MRI

## 2018-03-07 ENCOUNTER — Emergency Department (HOSPITAL_COMMUNITY): Payer: 59

## 2018-03-07 DIAGNOSIS — R531 Weakness: Secondary | ICD-10-CM

## 2018-03-07 DIAGNOSIS — R4689 Other symptoms and signs involving appearance and behavior: Secondary | ICD-10-CM | POA: Diagnosis not present

## 2018-03-07 LAB — RAPID URINE DRUG SCREEN, HOSP PERFORMED
Amphetamines: NOT DETECTED
BENZODIAZEPINES: POSITIVE — AB
Cocaine: NOT DETECTED
Opiates: NOT DETECTED
Tetrahydrocannabinol: NOT DETECTED

## 2018-03-07 MED ORDER — DIPHENHYDRAMINE HCL 50 MG/ML IJ SOLN
12.5000 mg | Freq: Once | INTRAMUSCULAR | Status: AC
  Administered 2018-03-07: 12.5 mg via INTRAVENOUS
  Filled 2018-03-07: qty 1

## 2018-03-07 MED ORDER — ONDANSETRON HCL 4 MG/2ML IJ SOLN
4.0000 mg | Freq: Once | INTRAMUSCULAR | Status: AC | PRN
  Administered 2018-03-07: 4 mg via INTRAVENOUS
  Filled 2018-03-07: qty 2

## 2018-03-07 MED ORDER — GADOBENATE DIMEGLUMINE 529 MG/ML IV SOLN
18.0000 mL | Freq: Once | INTRAVENOUS | Status: AC | PRN
  Administered 2018-03-07: 18 mL via INTRAVENOUS

## 2018-03-07 MED ORDER — FAMOTIDINE 20 MG PO TABS: 20.0000 mg | ORAL_TABLET | Freq: Two times a day (BID) | ORAL | 0 refills | Status: DC

## 2018-03-07 MED ORDER — LORAZEPAM 2 MG/ML IJ SOLN
1.0000 mg | Freq: Once | INTRAMUSCULAR | Status: AC
  Administered 2018-03-07: 1 mg via INTRAVENOUS
  Filled 2018-03-07: qty 1

## 2018-03-07 MED ORDER — ONDANSETRON 8 MG PO TBDP: ORAL_TABLET | ORAL | 0 refills | Status: DC

## 2018-03-07 MED ORDER — KETOROLAC TROMETHAMINE 30 MG/ML IJ SOLN
15.0000 mg | Freq: Once | INTRAMUSCULAR | Status: AC
  Administered 2018-03-07: 15 mg via INTRAVENOUS
  Filled 2018-03-07: qty 1

## 2018-03-07 MED ORDER — HALOPERIDOL LACTATE 5 MG/ML IJ SOLN
INTRAMUSCULAR | Status: AC
  Filled 2018-03-07: qty 1

## 2018-03-07 MED ORDER — HALOPERIDOL LACTATE 5 MG/ML IJ SOLN
2.0000 mg | Freq: Once | INTRAMUSCULAR | Status: AC
  Administered 2018-03-07: 2 mg via INTRAVENOUS

## 2018-03-07 MED ORDER — SODIUM CHLORIDE 0.9 % IV BOLUS
1000.0000 mL | Freq: Once | INTRAVENOUS | Status: AC
  Administered 2018-03-07: 1000 mL via INTRAVENOUS

## 2018-03-07 NOTE — ED Notes (Addendum)
Pt complains of weakness with joint pain, also pain in neck and behind eyes been going on since April. Pt and family report the pt also has had episodes of not being able to speak. Pt reports having no appetite with nausea and vomiting. Pt was diagnosed with postpartum depression on seroquel and prozac. Pt stopped taking seroquel last week.

## 2018-03-07 NOTE — ED Notes (Signed)
Pt returned from MRI °

## 2018-03-07 NOTE — ED Provider Notes (Addendum)
[facility] EMERGENCY DEPARTMENT Provider Note   CSN: [id number] Arrival date & time: 03/06/18  2244     History   Chief Complaint Chief Complaint  Patient presents with  . Weakness    HPI Brandy [name] is a 32 y.o. female.  The history is provided by the patient. No language interpreter was used.  Weakness  Primary symptoms include no focal weakness, no loss of sensation, no loss of balance, no visual change, no auditory change. This is a new problem. The current episode started more than 1 week ago (in [place]). The problem has been gradually worsening. There was no focality noted. There has been no fever. Associated symptoms include vomiting and headaches. Pertinent negatives include no shortness of breath, no chest pain, no altered mental status and no confusion. There were no medications administered prior to arrival. Associated medical issues do not include trauma, a bleeding disorder, seizures, dementia, CVA or a clotting disorder.  Has had headaches since during her pregnancy as well as vomiting during the entirety of the pregnancy and this has continued.  Family report she cannot eat and vomits 15 or more times a day and has these episodes.  She has had an MRI MRA of the brain, thyroid US, blood work and multiple ED and PMD evaluation and has been diagnosed with anxiety and postpartum depression but mother reports that is not what the patient has. She has stopped her seroquel and prozac as the family report her symptoms were worse on these medications.  No f/c/r.    Past Medical History:  Diagnosis Date  . Anxiety   . Depression   . Gallstones   . History of nephrolithiasis   . Kidney stone   . Migraine   . Pituitary tumor   . Vitamin D deficiency     Patient Active Problem List   Diagnosis Date Noted  . Headache, unspecified headache type 03/05/2018  . SVD (spontaneous vaginal delivery) 02/02/2018  . Postpartum care following vaginal delivery  02/02/2018  . Headache in pregnancy, third trimester 01/31/2018  . Class 1 obesity with body mass index (BMI) of 31.0 to 31.9 in adult 05/04/2017  . Nausea without vomiting 02/18/2017  . Abdominal pain, epigastric 02/18/2017  . Vitamin D deficiency 07/21/2016  . B12 deficiency 07/21/2016  . Generalized anxiety disorder 06/09/2016  . Anterior neck pain 12/30/2015  . Enlarged thyroid gland 12/30/2015  . Fatigue 12/30/2015  . Routine general medical examination at a health care facility 09/27/2014  . Tendinitis of right ankle 08/14/2014  . Nausea vomiting and diarrhea 12/28/2013  . Serum calcium elevated 12/28/2013  . 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 DUB (dysfunctional uterine bleeding) 06/04/2011  . GERD (gastroesophageal reflux disease) 02/19/2011  . Depression 09/06/2008  . NEPHROLITHIASIS, HX OF 12/16/2007    Past Surgical History:  Procedure Laterality Date  . CHOLECYSTECTOMY  2014  . [place], 2004  . MOUTH SURGERY    . TYMPANOSTOMY TUBE PLACEMENT       OB History    Gravida  3   Para  1   Term  1   Preterm  0   AB  1   Living  1     SAB  1   TAB  0   Ectopic  0   Multiple  0   Live Births  1        Obstetric Comments  Age first period 46  Home Medications    Prior to Admission medications   Medication Sig Start Date End Date Taking? Authorizing Provider  ALPRAZolam Duanne Moron) 0.5 MG tablet Take 0.5 mg by mouth 2 (two) times daily.    [provider]  FLUoxetine (PROZAC) 40 MG capsule Take 40 mg by mouth daily.    [provider]  Omega-3 Fatty Acids (FISH OIL) 1000 MG CAPS Take 1,000 mg by mouth daily.    [provider]  Prenatal Vit-Fe Fumarate-FA (PRENATAL MULTIVITAMIN) TABS tablet Take 1 tablet by mouth daily at 12 noon.    [provider]  promethazine (PHENERGAN) 25 MG tablet Take 1 tablet (25 mg total) by mouth every 8 (eight) hours as needed for up to 7 days for nausea or vomiting.  03/04/18 03/11/18  Martinique, Betty G, MD    Family History Family History  Problem Relation Age of Onset  . Diabetes Mother   . Mental illness Mother   . Asthma Mother   . Stomach cancer Mother   . Colon polyps Mother   . Heart disease Mother   . Hypertension Mother   . Arthritis Mother   . Diabetes Father   . Hypertension Father   . Colon cancer Maternal Grandmother   . Diabetes Paternal Grandmother   . Breast cancer Maternal Aunt   . Lung cancer Maternal Aunt   . Diabetes Paternal Aunt   . Diabetes Maternal Uncle   . Diabetes Maternal Uncle     Social History Social History   Tobacco Use  . Smoking status: Never Smoker  . Smokeless tobacco: Never Used  Substance Use Topics  . Alcohol use: Not Currently    Comment: social-1 every couple of months  . Drug use: No     Allergies   Penicillins   Review of Systems Review of Systems  Constitutional: Negative for fever.  Eyes: Negative for photophobia and visual disturbance.  Respiratory: Negative for shortness of breath.   Cardiovascular: Positive for palpitations. Negative for chest pain and leg swelling.  Gastrointestinal: Positive for nausea and vomiting. Negative for abdominal pain.  Musculoskeletal: Positive for myalgias.  Skin: Negative for rash.  Neurological: Positive for speech difficulty, weakness and headaches. Negative for focal weakness and loss of balance.  Psychiatric/Behavioral: Negative for confusion and hallucinations. The patient is nervous/anxious.   All other systems reviewed and are negative.    Physical Exam Updated Vital Signs BP 115/72   Pulse 74   Resp (!) 8   Ht 5\' 9"  (1.753 m)   Wt 90.7 kg (200 lb)   LMP 05/13/2017   SpO2 100%   BMI 29.53 kg/m   Physical Exam  Constitutional: She is oriented to person, place, and time. She appears well-developed and well-nourished.  HENT:  Head: Normocephalic and atraumatic.  Nose: Nose normal.  Mouth/Throat: No oropharyngeal exudate.  Eyes:  Conjunctivae and EOM are normal.  Neck: Normal range of motion. Neck supple.  Cardiovascular: Normal rate, regular rhythm, normal heart sounds and intact distal pulses.  Pulmonary/Chest: Effort normal and breath sounds normal. No stridor. She has no wheezes. She has no rales.  Abdominal: Soft. Bowel sounds are normal. She exhibits no mass. There is no tenderness. There is no rebound and no guarding.  Musculoskeletal: Normal range of motion.  Neurological: She is alert and oriented to person, place, and time. She displays normal reflexes. She exhibits normal muscle tone.  Skin: Skin is warm and dry. Capillary refill takes less than 2 seconds. She is not  diaphoretic.  Psychiatric: Her mood appears anxious.     ED Treatments / Results  Labs (all labs ordered are listed, but only abnormal results are displayed) Results for orders placed or performed during the hospital encounter of 03/06/18  Lipase, blood  Result Value Ref Range   Lipase 40 11 - 51 U/L  Comprehensive metabolic panel  Result Value Ref Range   Sodium 139 135 - 145 mmol/L   Potassium 3.5 3.5 - 5.1 mmol/L   Chloride 106 98 - 111 mmol/L   CO2 22 22 - 32 mmol/L   Glucose, Bld 106 (H) 70 - 99 mg/dL   BUN <5 (L) 6 - 20 mg/dL   Creatinine, Ser 0.99 0.44 - 1.00 mg/dL   Calcium 9.5 8.9 - 10.3 mg/dL   Total Protein 7.3 6.5 - 8.1 g/dL   Albumin 4.0 3.5 - 5.0 g/dL   AST 19 15 - 41 U/L   ALT 31 0 - 44 U/L   Alkaline Phosphatase 62 38 - 126 U/L   Total Bilirubin 0.6 0.3 - 1.2 mg/dL   GFR calc non Af Amer >60 >60 mL/min   GFR calc Af Amer >60 >60 mL/min   Anion gap 11 5 - 15  CBC  Result Value Ref Range   WBC 8.9 4.0 - 10.5 K/uL   RBC 4.40 3.87 - 5.11 MIL/uL   Hemoglobin 12.7 12.0 - 15.0 g/dL   HCT 40.0 36.0 - 46.0 %   MCV 90.9 78.0 - 100.0 fL   MCH 28.9 26.0 - 34.0 pg   MCHC 31.8 30.0 - 36.0 g/dL   RDW 12.4 11.5 - 15.5 %   Platelets 331 150 - 400 K/uL  Urinalysis, Routine w reflex microscopic  Result Value Ref Range    Color, Urine STRAW (A) YELLOW   APPearance CLEAR CLEAR   Specific Gravity, Urine 1.004 (L) 1.005 - 1.030   pH 7.0 5.0 - 8.0   Glucose, UA NEGATIVE NEGATIVE mg/dL   Hgb urine dipstick SMALL (A) NEGATIVE   Bilirubin Urine NEGATIVE NEGATIVE   Ketones, ur 20 (A) NEGATIVE mg/dL   Protein, ur NEGATIVE NEGATIVE mg/dL   Nitrite NEGATIVE NEGATIVE   Leukocytes, UA TRACE (A) NEGATIVE   RBC / HPF 0-5 0 - 5 RBC/hpf   WBC, UA 0-5 0 - 5 WBC/hpf   Bacteria, UA RARE (A) NONE SEEN   Squamous Epithelial / LPF 0-5 0 - 5   Mucus PRESENT   Rapid urine drug screen (hospital performed)  Result Value Ref Range   Opiates NONE DETECTED NONE DETECTED   Cocaine NONE DETECTED NONE DETECTED   Benzodiazepines POSITIVE (A) NONE DETECTED   Amphetamines NONE DETECTED NONE DETECTED   Tetrahydrocannabinol NONE DETECTED NONE DETECTED   Barbiturates (A) NONE DETECTED    Result not available. Reagent lot number recalled by manufacturer.  I-Stat beta hCG blood, ED  Result Value Ref Range   I-stat hCG, quantitative <5.0 <5 mIU/mL   Comment 3           Dg Chest 2 View  Result Date: 03/03/2018 CLINICAL DATA:  Chest pain, vomiting, shortness of breath, and weakness. EXAM: CHEST - 2 VIEW COMPARISON:  01/23/2017 FINDINGS: The heart size and mediastinal contours are within normal limits. Both lungs are clear. The visualized skeletal structures are unremarkable. IMPRESSION: No active cardiopulmonary disease. Electronically Signed   By: Lucienne Capers M.D.   On: 03/03/2018 06:20   Ct Head Wo Contrast  Result Date: 03/07/2018 CLINICAL DATA:  Ongoing  weakness. EXAM: CT HEAD WITHOUT CONTRAST TECHNIQUE: Contiguous axial images were obtained from the base of the skull through the vertex without intravenous contrast. COMPARISON:  Brain MRI 01/05/2018.  Head CT 10/25/2016 FINDINGS: Brain: No evidence of acute infarction, hemorrhage, hydrocephalus, extra-axial collection or mass effect. Known fullness of the pituitary gland, reportedly  there is imminent follow-up imaging scheduled. Vascular: No hyperdense vessel or unexpected calcification. Skull: Normal. Negative for fracture or focal lesion. Sinuses/Orbits: No acute finding. IMPRESSION: No acute finding. Electronically Signed   By: Monte Fantasia M.D.   On: 03/07/2018 02:28    EKG None  Radiology Ct Head Wo Contrast  Result Date: 03/07/2018 CLINICAL DATA:  Ongoing weakness. EXAM: CT HEAD WITHOUT CONTRAST TECHNIQUE: Contiguous axial images were obtained from the base of the skull through the vertex without intravenous contrast. COMPARISON:  Brain MRI 01/05/2018.  Head CT 10/25/2016 FINDINGS: Brain: No evidence of acute infarction, hemorrhage, hydrocephalus, extra-axial collection or mass effect. Known fullness of the pituitary gland, reportedly there is imminent follow-up imaging scheduled. Vascular: No hyperdense vessel or unexpected calcification. Skull: Normal. Negative for fracture or focal lesion. Sinuses/Orbits: No acute finding. IMPRESSION: No acute finding. Electronically Signed   By: Monte Fantasia M.D.   On: 03/07/2018 02:28    Procedures Procedures (including critical care time)  Medications Ordered in ED Medications  ondansetron (ZOFRAN) injection 4 mg (4 mg Intravenous Given 03/07/18 0033)  sodium chloride 0.9 % bolus 1,000 mL (0 mLs Intravenous Stopped 03/07/18 0213)  haloperidol lactate (HALDOL) injection 2 mg (2 mg Intravenous Given 03/07/18 0110)  diphenhydrAMINE (BENADRYL) injection 12.5 mg (12.5 mg Intravenous Given 03/07/18 0304)  ketorolac (TORADOL) 30 MG/ML injection 15 mg (15 mg Intravenous Given 03/07/18 0304)    Seen by neurology Dr. Lorraine Lax who states if the MRI and MRV are negative patient can follow up with her PMD  Negative MRI and MRV of the brain  I seen no signs of RMSF and based on the time course I highly doubt this but the family asked and I ordered this test.  Patient's PMD will need to follow up on this as it does not return during the course  of an ED visit.   Final Clinical Impressions(s) / ED Diagnoses   Family does not want to accept that the patient's symptoms are anxiety, postpartum depression or a mental health issues.  She has stopped her zoloft and seroquel as they report that it made the symptoms worse.  Though the family states she has not eaten and vomits more than 15 times at day there is no elevation of creatinine and electrolytes are normal.  Potassium should be low in the setting of ongoing emesis and patient should have large starvation ketones in urine.  Patient has had no emesis in the department.  I have had a long discussion with the family about the emergency care and had done a considerable amount of expectation setting as the mother of the patient was demanding admission and there are no acute conditions requiring admission at this time.    She has had a thorough work up for multiple conditions.  I have instructed close follow up with the patient's PMD to discuss and manage her ongoing issues .   Return for pain, numbness, changes in vision or speech, fevers >100.4 unrelieved by medication, shortness of breath, intractable vomiting, or diarrhea, abdominal pain, Inability to tolerate liquids or food, cough, altered mental status or any concerns. No signs of systemic illness or infection. The patient is  nontoxic-appearing on exam and vital signs are within normal limits. Will refer to urology for microscopy hematuria as patient is asymptomatic.  I have reviewed the triage vital signs and the nursing notes. Pertinent labs &imaging results that were available during my care of the patient were reviewed by me and considered in my medical decision making (see chart for details).  After history, exam, and medical workup I feel the patient has been appropriately medically screened and is safe for discharge home. Pertinent diagnoses were discussed with the patient. Patient was given return precautions.         Grafton Warzecha, MD 03/07/18 [place], Lonzo Saulter, MD 03/07/18 [place], [place] Borchardt, MD 03/08/18 1216

## 2018-03-07 NOTE — Consult Note (Addendum)
Requesting Physician: Dr. Randal Buba    Chief Complaint: Spells concerning for seizures, intractable vomiting   History obtained from: Patient and Chart     HPI:                                                                                                                                       Brandy [name] is an 32 y.o. female pituitary adenoma, migraine who was otherwise healthy until her pregnancy presents to the ER with multiple complaints.  This evening patient mother noticed the patient started having movements in her arms and shaking, twitching movements of her face followed by stuttering her words and unable to talk.  She was able to interact during this episode and would point to her head and neck.  Her mother called 911 instructed the patient to come to the ER.  Patient had witnessed movements even here in the ER and initially was not talking to the ED physician, however subsequently improved and returned back to her normal state in about 2 hours.  The family is very concerned and the patient has been having multiple complaints since her delivery and prior to that was also complaining of intractable vomiting and pain behind her eyes.  Her complaints include aware sensation behind both eyes, pain in the back of her neck, generalized myalgia, generalized burning sensation, blurry vision.  She has had multiple ER visits and has been diagnosed as postpartum depression and has been started on anxiolytics including Valium as well as Prozac and sertraline which is not helped with the symptoms.  Patient had an MRI done in April 2019 which showed a pituitary adenoma slightly larger in size of 12 mm from 9 mm prior MRI brain.  Pituitary hormones have been normal in the past per records.  She also had an MRA head and neck done in 1 of the ER visits.  She was seen by ophthalmologist who felt her visual complaints were likely not related to the pituitary tumor.   Past Medical History:  Diagnosis  Date  . Anxiety   . Depression   . Gallstones   . History of nephrolithiasis   . Kidney stone   . Migraine   . Pituitary tumor   . Vitamin D deficiency     Past Surgical History:  Procedure Laterality Date  . CHOLECYSTECTOMY  2014  . [place], 2004  . MOUTH SURGERY    . TYMPANOSTOMY TUBE PLACEMENT      Family History  Problem Relation Age of Onset  . Diabetes Mother   . Mental illness Mother   . Asthma Mother   . Stomach cancer Mother   . Colon polyps Mother   . Heart disease Mother   . Hypertension Mother   . Arthritis Mother   . Diabetes Father   . Hypertension Father   . Colon cancer Maternal Grandmother   . Diabetes  Paternal Grandmother   . Breast cancer Maternal Aunt   . Lung cancer Maternal Aunt   . Diabetes Paternal Aunt   . Diabetes Maternal Uncle   . Diabetes Maternal Uncle    Social History:  reports that she has never smoked. She has never used smokeless tobacco. She reports that she drank alcohol. She reports that she does not use drugs.  Allergies:  Allergies  Allergen Reactions  . Penicillins Hives and Other (See Comments)    Childhood reaction Has patient had a PCN reaction causing immediate rash, facial/tongue/throat swelling, SOB or lightheadedness with hypotension: No Has patient had a PCN reaction causing severe rash involving mucus membranes or skin necrosis: No Has patient had a PCN reaction that required hospitalization No Has patient had a PCN reaction occurring within the last 10 years: No If all of the above answers are "NO", then may proceed with Cephalosporin use.     Medications:                                                                                                                        I reviewed home medications   ROS:                                                                                                                                     14 systems reviewed and negative except above     Examination:                                                                                                      General: Appears well-developed and well-nourished.  Psych: Affect appropriate to situation Eyes: No scleral injection HENT: No OP obstrucion Head: Normocephalic.  Cardiovascular: Normal rate and regular rhythm.  Respiratory: Effort normal and breath sounds normal to anterior ascultation GI: Soft.  No distension. There is no tenderness.  Skin: WDI    Neurological Examination Mental Status: Alert, oriented, thought content appropriate.  Speech fluent without evidence  of aphasia. Able to follow 3 step commands without difficulty. Cranial Nerves: II: Visual fields grossly normal,  III,IV, VI: ptosis not present, extra-ocular motions intact bilaterally, pupils equal, round, reactive to light and accommodation V,VII: smile symmetric, facial light touch sensation normal bilaterally VIII: hearing normal bilaterally IX,X: uvula rises symmetrically XI: bilateral shoulder shrug XII: midline tongue extension Motor: Right : Upper extremity   5/5    Left:     Upper extremity   5/5  Lower extremity   5/5     Lower extremity   5/5 Tone and bulk:normal tone throughout; no atrophy noted Sensory: Pinprick and light touch intact throughout, bilaterally Deep Tendon Reflexes: 2+ and symmetric throughout Plantars: Right: downgoing   Left: downgoing Cerebellar: normal finger-to-nose, normal rapid alternating movements and normal heel-to-shin test Gait: normal gait and station     Lab Results: Basic Metabolic Panel: Recent Labs  Lab 03/03/18 0611 03/06/18 2259  NA 142 139  K 3.9 3.5  CL 108 106  CO2 24 22  GLUCOSE 119* 106*  BUN 9 <5*  CREATININE 1.14* 0.99  CALCIUM 9.3 9.5    CBC: Recent Labs  Lab 03/03/18 0611 03/06/18 2259  WBC 6.4 8.9  HGB 13.2 12.7  HCT 42.3 40.0  MCV 91.6 90.9  PLT 311 331    Coagulation Studies: No results for input(s): LABPROT, INR in  the last 72 hours.  Imaging: Ct Head Wo Contrast  Result Date: 03/07/2018 CLINICAL DATA:  Ongoing weakness. EXAM: CT HEAD WITHOUT CONTRAST TECHNIQUE: Contiguous axial images were obtained from the base of the skull through the vertex without intravenous contrast. COMPARISON:  Brain MRI 01/05/2018.  Head CT 10/25/2016 FINDINGS: Brain: No evidence of acute infarction, hemorrhage, hydrocephalus, extra-axial collection or mass effect. Known fullness of the pituitary gland, reportedly there is imminent follow-up imaging scheduled. Vascular: No hyperdense vessel or unexpected calcification. Skull: Normal. Negative for fracture or focal lesion. Sinuses/Orbits: No acute finding. IMPRESSION: No acute finding. Electronically Signed   By: Monte Fantasia M.D.   On: 03/07/2018 02:28     I have reviewed the above imaging    ASSESSMENT AND PLAN   32 y.o. female pituitary adenoma, migraine who was otherwise healthy until her pregnancy presents to the ER with multiple complaints.  Description of her spell with abnormal hand movements as well as not being able to talk (more like stuttering speech) and able to interact during the episode makes seizures very low probability.  Regarding her headache, the patient denies it as a headache but rather has abnormal sensation behind her eyes.  This does not sound like a migraine, however still a remote possibility.  Patient does appear to be anxious and has multiple somatic complaints.  She is very concerned about her pituitary adenoma.  Is also been depressed and she has intractable vomiting without any clear reason.    Headache( pressure behind eyes)  -Recommend MRI brain with contrast to assist for any increase in size of the adenoma as well as MR venogram to rule out sinus venous thrombosis.   Her headache also does not sound like a typical migraine, however will administer Toradol to see if this helps in addition to her migraine cocktail.  Spells - continue to monitor  as low suspicion for seizures and described to more like non epileptics spells, if recurs will need EEG     #### Addendum  Patient is an MRI was read after I had left.  No acute abnormalities.  Pituitary adenoma did  not appear to compress on any structures.  No evidence of intracranial shift explain patient's persistent vomiting.  Patient can follow with outpatient neurologist for her persistent headaches.   Brandy [name] [facility] Pager Number [phone]

## 2018-03-07 NOTE — ED Notes (Signed)
Patient transported to MRI 

## 2018-03-08 LAB — B. BURGDORFI ANTIBODIES

## 2018-03-09 LAB — ROCKY MTN SPOTTED FVR ABS PNL(IGG+IGM)
RMSF IGM: 0.6 {index} (ref 0.00–0.89)
RMSF IgG: NEGATIVE

## 2018-03-09 LAB — ALDOSTERONE + RENIN ACTIVITY W/ RATIO
ALDO / PRA Ratio: 1 Ratio (ref 0.9–28.9)
Aldosterone: 1 ng/dL
Renin Activity: 1.01 ng/mL/h (ref 0.25–5.82)

## 2018-03-10 ENCOUNTER — Encounter: Payer: Self-pay | Admitting: Family Medicine

## 2018-03-15 ENCOUNTER — Telehealth: Payer: Self-pay | Admitting: Family Medicine

## 2018-03-15 NOTE — Telephone Encounter (Signed)
Copied from [place] [phone]. Topic: Quick Communication - See Telephone Encounter >> Mar 15, 2018  3:50 PM Bea Graff, NT wrote: CRM for notification. See Telephone encounter for: 03/15/18. Pt calling and states the cardiologist could not get her an appt until 04/27/18 and she would like to see if Dr. Martinique can get her in sooner? She states she needs something sooner as she is currently experiencing weakness, sweating, and heart rate in the 140s. Pt states she spoke with a triage nurse last night who told her to go to the emergency room but that she didn't go because she states they tell her nothing is wrong. Offered to have her speak with nurse triage today and she refused. Spoke with As[place] and she stated as well that patient needs to go to the emergency room as she also is 1 month post partum. Relayed this information to the patient and she still is saying she will not go to the emergency room and did not want to speak with nurse triage but that she would like to get in with a cardiologist as soon as possible. Please advise.

## 2018-03-15 NOTE — Telephone Encounter (Signed)
Discussed w/ Dr. Martinique as symptoms reported below seem to be identical for symptoms discussed at last OV and patient has already had extensive office and ED workups. Per Dr. Martinique, no office visit needed at this time, okay to try to arrange sooner cardiology appt. I was able to get her appt moved up about a week and a half and the patient has been notified of the new appt.

## 2018-03-16 ENCOUNTER — Emergency Department
Admission: EM | Admit: 2018-03-16 | Discharge: 2018-03-16 | Disposition: A | Discharge status: Home / Self Care | Payer: 59 | Attending: Student in an Organized Health Care Education/Training Program | Admitting: Student in an Organized Health Care Education/Training Program

## 2018-03-16 ENCOUNTER — Other Ambulatory Visit: Payer: Self-pay

## 2018-03-16 ENCOUNTER — Emergency Department: Payer: 59

## 2018-03-16 ENCOUNTER — Encounter: Payer: Self-pay | Admitting: *Deleted

## 2018-03-16 DIAGNOSIS — F439 Reaction to severe stress, unspecified: Secondary | ICD-10-CM | POA: Diagnosis not present

## 2018-03-16 DIAGNOSIS — R079 Chest pain, unspecified: Secondary | ICD-10-CM

## 2018-03-16 DIAGNOSIS — Z79899 Other long term (current) drug therapy: Secondary | ICD-10-CM | POA: Insufficient documentation

## 2018-03-16 DIAGNOSIS — R0789 Other chest pain: Secondary | ICD-10-CM | POA: Diagnosis present

## 2018-03-16 LAB — BASIC METABOLIC PANEL
ANION GAP: 8 (ref 5–15)
BUN: 8 mg/dL (ref 6–20)
CO2: 27 mmol/L (ref 22–32)
Calcium: 9.5 mg/dL (ref 8.9–10.3)
Chloride: 106 mmol/L (ref 98–111)
Creatinine, Ser: 0.92 mg/dL (ref 0.44–1.00)
GFR calc non Af Amer: >60 mL/min (ref >60)
Glucose, Bld: 110 mg/dL — ABNORMAL HIGH (ref 70–99)
POTASSIUM: 3.8 mmol/L (ref 3.5–5.1)
SODIUM: 141 mmol/L (ref 135–145)

## 2018-03-16 LAB — FIBRIN DERIVATIVES D-DIMER (ARMC ONLY): Fibrin derivatives D-dimer (ARMC): 278.59 ng/mL (FEU) (ref 0.00–499.00)

## 2018-03-16 LAB — CBC
HCT: 40.9 % (ref 35.0–47.0)
HEMOGLOBIN: 13.6 g/dL (ref 12.0–16.0)
MCH: 29.4 pg (ref 26.0–34.0)
MCHC: 33.2 g/dL (ref 32.0–36.0)
MCV: 88.7 fL (ref 80.0–100.0)
Platelets: 324 10*3/uL (ref 150–440)
RBC: 4.61 MIL/uL (ref 3.80–5.20)
RDW: 13.6 % (ref 11.5–14.5)
WBC: 7.5 10*3/uL (ref 3.6–11.0)

## 2018-03-16 LAB — TROPONIN I: Troponin I: <0.03 ng/mL (ref <0.03)

## 2018-03-16 MED ORDER — SODIUM CHLORIDE 0.9 % IV BOLUS
1000.0000 mL | Freq: Once | INTRAVENOUS | Status: AC
Start: 2018-03-16 — End: 2018-03-16
  Administered 2018-03-16: 1000 mL via INTRAVENOUS

## 2018-03-16 MED ORDER — LORAZEPAM 1 MG PO TABS
1.0000 mg | ORAL_TABLET | Freq: Once | ORAL | Status: AC
  Administered 2018-03-16: 1 mg via ORAL
  Filled 2018-03-16: qty 1

## 2018-03-16 NOTE — ED Notes (Signed)
Pt ambulatory to restroom in room.

## 2018-03-16 NOTE — ED Triage Notes (Signed)
  Pt to ED reporting centralized chest pain that has persisted for the past couple weeks. Pt has intermittent dizziness with near syncopal episodes. Nausea with persistent vomiting without taking phenergan at home. And SOB and headaches. Pt is 6 weeks postpartum and continues to feel as though, "something is not right" Pt reports having been to multiple offices and ED to see what is wrong but the only answer she has received is that her BP and HR elevate upon standing. Cardiology follow up scheduled for Aug. 16th but pt reports she can not wait and needs a echo now.

## 2018-03-16 NOTE — ED Provider Notes (Signed)
[facility] Emergency Department Provider Note    First MD Initiated Contact with Patient 03/16/18 1539     (approximate)  I have reviewed the triage vital signs and the nursing notes.   HISTORY  Chief Complaint Chest Pain    HPI Brandy [name] is a 32 y.o. female [place] of anxiety and depression with a history of anxiety and depression as well as history of postpartum depression presents to the ER with persistent midsternal chest pain.  States that she been having these symptoms for several weeks.  Has had multiple visits to outpatient clinic as well as ER for extensive evaluation and symptoms including multiple EKGs serial enzymes, negative MRI and MRA.  Patient was recently restarted on Prozac and had been on benzodiazepine for history of anxiety and stopped taking this 3 days ago.  She denies any hallucinations.  Has had decreased oral intake and is concerned because anytime she stands up she feels lightheaded.  She is not breast-feeding.  Denies any SI or HI.    Past Medical History:  Diagnosis Date  . Anxiety   . Depression   . Gallstones   . History of nephrolithiasis   . Kidney stone   . Migraine   . Pituitary tumor   . Vitamin D deficiency    Family History  Problem Relation Age of Onset  . Diabetes Mother   . Mental illness Mother   . Asthma Mother   . Stomach cancer Mother   . Colon polyps Mother   . Heart disease Mother   . Hypertension Mother   . Arthritis Mother   . Diabetes Father   . Hypertension Father   . Colon cancer Maternal Grandmother   . Diabetes Paternal Grandmother   . Breast cancer Maternal Aunt   . Lung cancer Maternal Aunt   . Diabetes Paternal Aunt   . Diabetes Maternal Uncle   . Diabetes Maternal Uncle    Past Surgical History:  Procedure Laterality Date  . CHOLECYSTECTOMY  2014  . [place], 2004  . MOUTH SURGERY    . TYMPANOSTOMY TUBE PLACEMENT     Patient Active Problem List   Diagnosis Date Noted  . Headache, unspecified headache type 03/05/2018  . SVD (spontaneous vaginal delivery) 02/02/2018  . Postpartum care following vaginal delivery 02/02/2018  . Headache in pregnancy, third trimester 01/31/2018  . Class 1 obesity with body mass index (BMI) of 31.0 to 31.9 in adult 05/04/2017  . Nausea without vomiting 02/18/2017  . Abdominal pain, epigastric 02/18/2017  . Vitamin D deficiency 07/21/2016  . B12 deficiency 07/21/2016  . Generalized anxiety disorder 06/09/2016  . Anterior neck pain 12/30/2015  . Enlarged thyroid gland 12/30/2015  . Fatigue 12/30/2015  . Routine general medical examination at a health care facility 09/27/2014  . Tendinitis of right ankle 08/14/2014  . Nausea vomiting and diarrhea 12/28/2013  . Serum calcium elevated 12/28/2013  . 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 DUB (dysfunctional uterine bleeding) 06/04/2011  . GERD (gastroesophageal reflux disease) 02/19/2011  . Depression 09/06/2008  . NEPHROLITHIASIS, HX OF 12/16/2007      Prior to Admission medications   Medication Sig Start Date End Date Taking? Authorizing Provider  ALPRAZolam Duanne Moron) 0.5 MG tablet Take 0.5 mg by mouth 2 (two) times daily.    [provider]  famotidine (PEPCID) 20 MG tablet Take 1 tablet (20 mg total) by mouth 2 (two) times daily. 03/07/18   Palumbo, April, MD  FLUoxetine [facility])  40 MG capsule Take 40 mg by mouth daily.    [provider]  Omega-3 Fatty Acids (FISH OIL) 1000 MG CAPS Take 1,000 mg by mouth daily.    [provider]  ondansetron (ZOFRAN ODT) 8 MG disintegrating tablet 8mg  ODT q8 hours prn nausea 03/07/18   Palumbo, April, MD  Prenatal Vit-Fe Fumarate-FA (PRENATAL MULTIVITAMIN) TABS tablet Take 1 tablet by mouth daily at 12 noon.    [provider]  promethazine (PHENERGAN) 25 MG tablet Take 1 tablet (25 mg total) by mouth every 8 (eight) hours as needed for up to 7 days for nausea or vomiting. 03/04/18 03/11/18  Martinique,  Betty G, MD    Allergies Penicillins and Reglan [metoclopramide]    Social History Social History   Tobacco Use  . Smoking status: Never Smoker  . Smokeless tobacco: Never Used  Substance Use Topics  . Alcohol use: Not Currently    Frequency: Never  . Drug use: No    Review of Systems Patient denies headaches, rhinorrhea, blurry vision, numbness, shortness of breath, chest pain, edema, cough, abdominal pain, nausea, vomiting, diarrhea, dysuria, fevers, rashes or hallucinations unless otherwise stated above in HPI. ____________________________________________   PHYSICAL EXAM:  VITAL SIGNS: Vitals:   03/16/18 1513 03/16/18 1547  BP: 109/77 126/90  Pulse: 88 95  Resp: 16 11  Temp: 98.2 F (36.8 C)   SpO2: 97% 97%    Constitutional: Alert and oriented.  Eyes: Conjunctivae are normal.  Head: Atraumatic. Nose: No congestion/rhinnorhea. Mouth/Throat: Mucous membranes are moist.   Neck: No stridor. Painless ROM.  Cardiovascular: Normal rate, regular rhythm. Grossly normal heart sounds.  Good peripheral circulation. Respiratory: Normal respiratory effort.  No retractions. Lungs CTAB. Gastrointestinal: Soft and nontender. No distention. No abdominal bruits. No CVA tenderness. Genitourinary: deferred Musculoskeletal: No lower extremity tenderness nor edema.  No joint effusions. Neurologic:  Normal speech and language. No gross focal neurologic deficits are appreciated. No facial droop Skin:  Skin is warm, dry and intact. No rash noted. Psychiatric: very anxious appearing  ____________________________________________   LABS (all labs ordered are listed, but only abnormal results are displayed)  Results for orders placed or performed during the hospital encounter of 03/16/18 (from the past 24 hour(s))  Basic metabolic panel     Status: Abnormal   Collection Time: 03/16/18  3:25 PM  Result Value Ref Range   Sodium 141 135 - 145 mmol/L   Potassium 3.8 3.5 - 5.1 mmol/L     Chloride 106 98 - 111 mmol/L   CO2 27 22 - 32 mmol/L   Glucose, Bld 110 (H) 70 - 99 mg/dL   BUN 8 6 - 20 mg/dL   Creatinine, Ser 0.92 0.44 - 1.00 mg/dL   Calcium 9.5 8.9 - 10.3 mg/dL   GFR calc non Af Amer >60 >60 mL/min   GFR calc Af Amer >60 >60 mL/min   Anion gap 8 5 - 15  CBC     Status: None   Collection Time: 03/16/18  3:25 PM  Result Value Ref Range   WBC 7.5 3.6 - 11.0 K/uL   RBC 4.61 3.80 - 5.20 MIL/uL   Hemoglobin 13.6 12.0 - 16.0 g/dL   HCT 40.9 35.0 - 47.0 %   MCV 88.7 80.0 - 100.0 fL   MCH 29.4 26.0 - 34.0 pg   MCHC 33.2 32.0 - 36.0 g/dL   RDW 13.6 11.5 - 14.5 %   Platelets 324 150 - 440 K/uL  Troponin I  Status: None   Collection Time: 03/16/18  3:25 PM  Result Value Ref Range   Troponin I <0.03 <0.03 ng/mL   ____________________________________________  EKG My review and personal interpretation at Time: 15:09   Indication: chest pain  Rate: 99  Rhythm: sinus Axis: normal Other: normal intervals, no stemi ____________________________________________  RADIOLOGY  I personally reviewed all radiographic images ordered to evaluate for the above acute complaints and reviewed radiology reports and findings.  These findings were personally discussed with the patient.  Please see medical record for radiology report.    EMERGENCY DEPARTMENT [place] CARDIAC EXAM "Study: Limited Ultrasound of the Heart and Pericardium"  INDICATIONS:Chest pain Multiple views of the heart and pericardium were obtained in real-time with a multi-frequency probe.  PERFORMED GE:ZMOQHU IMAGES ARCHIVED?: No LIMITATIONS:  None VIEWS USED: Subcostal 4 chamber and Apical 4 chamber  INTERPRETATION: Cardiac activity present, Pericardial effusioin absent, Cardiac tamponade absent, Normal contractility and Volume status normal    ____________________________________________   PROCEDURES  Procedure(s) performed:  Procedures    Critical Care performed:  no ____________________________________________   INITIAL IMPRESSION / ASSESSMENT AND PLAN / ED COURSE  Pertinent labs & imaging results that were available during my care of the patient were reviewed by me and considered in my medical decision making (see chart for details).   DDX: anxiety, ppd,  ppcm, acs, pericarditis, pe, dehydration, orthostasis  Dewana Ammirati is a 32 y.o. who presents to the ED with symptoms as described above.  Patient well-appearing but is very anxious.  Denies any SI or HI.  EKG shows no evidence of acute ischemia and troponin is negative.  Patient's already had extensive evaluation for multiple cyst presentation and symptoms since her postpartum course.  Bedside echo does not show any evidence of pericardial effusion or valvular insufficiency on my exam.  Does not seem clinically consistent with pericarditis.  No evidence of postpartum cardiomyopathy.  Blood pressure stable not clinically consistent with eclampsia preeclampsia.  Patient is low risk by Wells criteria but d-dimer sent to evaluate for PE.  D-dimer is negative.  Chest x-ray shows no consolidation.  Her abdominal exam is soft and benign.  Patient does not meet any criteria for involuntary commitment.  She is established with psychiatry as an outpatient and I have recommended patient follow-up with psychiatrist as well as consider a second opinion as it does appear that her current medication regimen is not providing much symptomatic relief.  Patient had significant improvement in symptoms after Ativan.  On reassessment I do believe she stable and appropriate for outpatient follow-up.      As part of my medical decision making, I reviewed the following data within the [place] notes reviewed and incorporated, Labs reviewed, notes from prior ED visits.   ____________________________________________   FINAL CLINICAL IMPRESSION(S) / ED DIAGNOSES  Final diagnoses:  Chest pain,  unspecified type  Stress      NEW MEDICATIONS STARTED DURING THIS VISIT:  New Prescriptions   No medications on file     Note:  This document was prepared using Dragon voice recognition software and may include unintentional dictation errors.    Merlyn Lot, MD 03/16/18 2031

## 2018-03-16 NOTE — ED Notes (Signed)
Pt reports CP that started 1 week ago. N/V since having baby 6 weeks ago. Pt states "I just want someone to check my heart, I feel like I am going to have a heart attack." Pt appears anxious and reports hx anxiety/depression and on Paxil. Pt states "I just want an echocardiogram on my heart to make me feel better." RN and MD Quentin Cornwall reassured pt and updated pt on plan of care. Pt sister at bedside. Call bell within reach, will continue to monitor.

## 2018-03-21 DIAGNOSIS — R072 Precordial pain: Secondary | ICD-10-CM | POA: Insufficient documentation

## 2018-03-21 DIAGNOSIS — R002 Palpitations: Secondary | ICD-10-CM

## 2018-03-21 HISTORY — DX: Precordial pain: R07.2

## 2018-03-21 HISTORY — DX: Palpitations: R00.2

## 2018-03-25 ENCOUNTER — Emergency Department: Payer: 59

## 2018-03-25 ENCOUNTER — Emergency Department
Admission: EM | Admit: 2018-03-25 | Discharge: 2018-03-25 | Disposition: A | Discharge status: Home / Self Care | Payer: 59 | Attending: Emergency Medicine | Admitting: Emergency Medicine

## 2018-03-25 ENCOUNTER — Encounter: Payer: Self-pay | Admitting: Emergency Medicine

## 2018-03-25 DIAGNOSIS — Z79899 Other long term (current) drug therapy: Secondary | ICD-10-CM | POA: Diagnosis not present

## 2018-03-25 DIAGNOSIS — R0602 Shortness of breath: Secondary | ICD-10-CM | POA: Insufficient documentation

## 2018-03-25 DIAGNOSIS — R079 Chest pain, unspecified: Secondary | ICD-10-CM

## 2018-03-25 LAB — BASIC METABOLIC PANEL
ANION GAP: 8 (ref 5–15)
BUN: 11 mg/dL (ref 6–20)
CHLORIDE: 106 mmol/L (ref 98–111)
CO2: 28 mmol/L (ref 22–32)
Calcium: 9.7 mg/dL (ref 8.9–10.3)
Creatinine, Ser: 0.9 mg/dL (ref 0.44–1.00)
GFR calc non Af Amer: >60 mL/min (ref >60)
Glucose, Bld: 101 mg/dL — ABNORMAL HIGH (ref 70–99)
POTASSIUM: 4.1 mmol/L (ref 3.5–5.1)
SODIUM: 142 mmol/L (ref 135–145)

## 2018-03-25 LAB — CBC
HCT: 40.7 % (ref 35.0–47.0)
Hemoglobin: 13.8 g/dL (ref 12.0–16.0)
MCH: 29.8 pg (ref 26.0–34.0)
MCHC: 33.9 g/dL (ref 32.0–36.0)
MCV: 88 fL (ref 80.0–100.0)
PLATELETS: 339 10*3/uL (ref 150–440)
RBC: 4.62 MIL/uL (ref 3.80–5.20)
RDW: 14 % (ref 11.5–14.5)
WBC: 7.9 10*3/uL (ref 3.6–11.0)

## 2018-03-25 LAB — TSH: TSH: 0.604 u[IU]/mL (ref 0.350–4.500)

## 2018-03-25 LAB — TROPONIN I

## 2018-03-25 LAB — BRAIN NATRIURETIC PEPTIDE: B Natriuretic Peptide: 15 pg/mL (ref 0.0–100.0)

## 2018-03-25 LAB — POCT PREGNANCY, URINE: Preg Test, Ur: NEGATIVE

## 2018-03-25 LAB — FIBRIN DERIVATIVES D-DIMER (ARMC ONLY): Fibrin derivatives D-dimer (ARMC): 280.53 ng/mL (FEU) (ref 0.00–499.00)

## 2018-03-25 MED ORDER — SODIUM CHLORIDE 0.9 % IV BOLUS
1000.0000 mL | Freq: Once | INTRAVENOUS | Status: AC
Start: 2018-03-25 — End: 2018-03-25
  Administered 2018-03-25: 1000 mL via INTRAVENOUS

## 2018-03-25 MED ORDER — ACETAMINOPHEN 325 MG PO TABS
650.0000 mg | ORAL_TABLET | Freq: Once | ORAL | Status: AC
  Administered 2018-03-25: 650 mg via ORAL

## 2018-03-25 MED ORDER — LORAZEPAM 1 MG PO TABS: 1.0000 mg | ORAL_TABLET | Freq: Once | ORAL | Status: DC

## 2018-03-25 MED ORDER — ACETAMINOPHEN 325 MG PO TABS
ORAL_TABLET | ORAL | Status: AC
  Filled 2018-03-25: qty 2

## 2018-03-25 NOTE — ED Notes (Signed)
Pt verbalizes understanding of discharge instructions.

## 2018-03-25 NOTE — ED Notes (Signed)
Patient states her chest pain was worse.  This RN performed a repeat EKG.  EKG visualized and signed by Dr. Alfred Levins.

## 2018-03-25 NOTE — Discharge Instructions (Signed)

## 2018-03-25 NOTE — ED Provider Notes (Signed)
[facility] Emergency Department Provider Note  ____________________________________________  Time seen: Approximately 4:31 PM  I have reviewed the triage vital signs and the nursing notes.   HISTORY  Chief Complaint Shortness of Breath and Chest Pain   HPI Brandy [name] is a 32 y.o. female with a history of anxiety and depression who presents for evaluation of chest pain and shortness of breath.  Patient is 7 weeks postpartum from a normal spontaneous vaginal delivery with no complications.  For the last 2 weeks patient has had constant chest pain and shortness of breath.  She reports that the chest pain is a squeezing sensation in the center of her chest that is constant and nonradiating.  It is associated with shortness of breath.  Her symptoms are not worse with exertion or laying flat.  No unintentional weight gain or leg swelling.  No cough or fever.  She also has had diarrhea for several weeks, multiple daily episodes, no melena or hematochezia.  Patient denies any personal or family history of blood clots, leg pain or swelling, hemoptysis.  Patient was seen here 11 days ago for the same.  She had extensive evaluation including labs, d-dimer, chest x-ray, and EKG which were all within normal limits.  She was wearing a Holter monitor for several days which results returned into cardiologist today.  When she called her cardiologist's office he was not there and she was instructed to come to the emergency room.  Patient does report feeling depressed since having the baby.  She denies SI or HI.  She endorses feeling anxious as well and reports that her anxiety comes from her symptoms and the fact that no one knows what is wrong with her.  Past Medical History:  Diagnosis Date  . Anxiety   . Depression   . Gallstones   . History of nephrolithiasis   . Kidney stone   . Migraine   . Pituitary tumor   . Vitamin D deficiency     Patient Active Problem List   Diagnosis Date Noted  . Headache, unspecified headache type 03/05/2018  . SVD (spontaneous vaginal delivery) 02/02/2018  . Postpartum care following vaginal delivery 02/02/2018  . Headache in pregnancy, third trimester 01/31/2018  . Class 1 obesity with body mass index (BMI) of 31.0 to 31.9 in adult 05/04/2017  . Nausea without vomiting 02/18/2017  . Abdominal pain, epigastric 02/18/2017  . Vitamin D deficiency 07/21/2016  . B12 deficiency 07/21/2016  . Generalized anxiety disorder 06/09/2016  . Anterior neck pain 12/30/2015  . Enlarged thyroid gland 12/30/2015  . Fatigue 12/30/2015  . Routine general medical examination at a health care facility 09/27/2014  . Tendinitis of right ankle 08/14/2014  . Nausea vomiting and diarrhea 12/28/2013  . Serum calcium elevated 12/28/2013  . 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 DUB (dysfunctional uterine bleeding) 06/04/2011  . GERD (gastroesophageal reflux disease) 02/19/2011  . Depression 09/06/2008  . NEPHROLITHIASIS, HX OF 12/16/2007    Past Surgical History:  Procedure Laterality Date  . CHOLECYSTECTOMY  2014  . [place], 2004  . MOUTH SURGERY    . TYMPANOSTOMY TUBE PLACEMENT      Prior to Admission medications   Medication Sig Start Date End Date Taking? Authorizing Provider  ALPRAZolam Duanne Moron) 0.5 MG tablet Take 0.5 mg by mouth 2 (two) times daily.    [provider]  famotidine (PEPCID) 20 MG tablet Take 1 tablet (20 mg total) by mouth 2 (two) times daily. 03/07/18  Palumbo, April, MD  FLUoxetine (PROZAC) 40 MG capsule Take 40 mg by mouth daily.    [provider]  Omega-3 Fatty Acids (FISH OIL) 1000 MG CAPS Take 1,000 mg by mouth daily.    [provider]  ondansetron (ZOFRAN ODT) 8 MG disintegrating tablet 8mg  ODT q8 hours prn nausea 03/07/18   Palumbo, April, MD  Prenatal Vit-Fe Fumarate-FA (PRENATAL MULTIVITAMIN) TABS tablet Take 1 tablet by mouth daily at 12 noon.    [provider]    promethazine (PHENERGAN) 25 MG tablet Take 1 tablet (25 mg total) by mouth every 8 (eight) hours as needed for up to 7 days for nausea or vomiting. 03/04/18 03/11/18  Martinique, Betty G, MD    Allergies Penicillins and Reglan [metoclopramide]  Family History  Problem Relation Age of Onset  . Diabetes Mother   . Mental illness Mother   . Asthma Mother   . Stomach cancer Mother   . Colon polyps Mother   . Heart disease Mother   . Hypertension Mother   . Arthritis Mother   . Diabetes Father   . Hypertension Father   . Colon cancer Maternal Grandmother   . Diabetes Paternal Grandmother   . Breast cancer Maternal Aunt   . Lung cancer Maternal Aunt   . Diabetes Paternal Aunt   . Diabetes Maternal Uncle   . Diabetes Maternal Uncle     Social History Social History   Tobacco Use  . Smoking status: Never Smoker  . Smokeless tobacco: Never Used  Substance Use Topics  . Alcohol use: Not Currently    Frequency: Never  . Drug use: No    Review of Systems  Constitutional: Negative for fever. Eyes: Negative for visual changes. ENT: Negative for sore throat. Neck: No neck pain  Cardiovascular: + chest pain. Respiratory: + shortness of breath. Gastrointestinal: Negative for abdominal pain, vomiting or diarrhea. Genitourinary: Negative for dysuria. Musculoskeletal: Negative for back pain. Skin: Negative for rash. Neurological: Negative for headaches, weakness or numbness. Psych: No SI or HI. + anxious and depressed  ____________________________________________   PHYSICAL EXAM:  VITAL SIGNS: ED Triage Vitals  Enc Vitals Group     BP 03/25/18 1438 118/72     Pulse Rate 03/25/18 1438 82     Resp 03/25/18 1438 16     Temp 03/25/18 1438 98 F (36.7 C)     Temp Source 03/25/18 1438 Oral     SpO2 03/25/18 1438 99 %     Weight 03/25/18 1439 191 lb (86.6 kg)     Height 03/25/18 1439 5\' 9"  (1.753 m)     Head Circumference --      Peak Flow --      Pain Score --      Pain Loc  --      Pain Edu? --      Excl. in [place]? --     Constitutional: Alert and oriented, patient is very anxious. Well appearing and in no apparent distress. HEENT:      Head: Normocephalic and atraumatic.         Eyes: Conjunctivae are normal. Sclera is non-icteric.       Mouth/Throat: Mucous membranes are moist.       Neck: Supple with no signs of meningismus. Cardiovascular: Regular rate and rhythm. No murmurs, gallops, or rubs. 2+ symmetrical distal pulses are present in all extremities. No JVD. Respiratory: Normal respiratory effort. Lungs are clear to auscultation bilaterally. No wheezes, crackles, or rhonchi.  Gastrointestinal: Soft, non tender, and non distended with positive bowel sounds. No rebound or guarding. Musculoskeletal: Nontender with normal range of motion in all extremities. No edema, cyanosis, or erythema of extremities. Neurologic: Normal speech and language. Face is symmetric. Moving all extremities. No gross focal neurologic deficits are appreciated. Skin: Skin is warm, dry and intact. No rash noted. Psychiatric: Mood and affect are normal. Speech and behavior are normal.  ____________________________________________   LABS (all labs ordered are listed, but only abnormal results are displayed)  Labs Reviewed  BASIC METABOLIC PANEL - Abnormal; Notable for the following components:      Result Value   Glucose, Bld 101 (*)    All other components within normal limits  CBC  TROPONIN I  FIBRIN DERIVATIVES D-DIMER ([facility] ONLY)  BRAIN NATRIURETIC PEPTIDE  TSH  POC URINE PREG, ED  POCT PREGNANCY, URINE   ____________________________________________  EKG  ED ECG REPORT I, Rudene Re, the attending physician, personally viewed and interpreted this ECG.  Normal sinus rhythm, rate of 71, normal intervals, normal axis, T wave inversion in lead III, no ST elevation or depression.  Normal EKG.  Unchanged from prior from 9 days ago.  15:26 -normal sinus rhythm,  rate of 82, normal intervals, normal axis, persistent T wave inversion in lead III with no ST elevations or depressions.  Unchanged from initial. ____________________________________________  RADIOLOGY  I have personally reviewed the images performed during this visit and I agree with the Radiologist's read.   Interpretation by Radiologist:  Dg Chest 2 View  Result Date: 03/25/2018 CLINICAL DATA:  Chest pain and shortness of breath for 2 weeks. EXAM: CHEST - 2 VIEW COMPARISON:  March 16, 2018 FINDINGS: The heart size and mediastinal contours are within normal limits. Both lungs are clear. The visualized skeletal structures are unremarkable. IMPRESSION: No active cardiopulmonary disease. Electronically Signed   By: Dorise Bullion III M.D   On: 03/25/2018 17:06     ____________________________________________   PROCEDURES  Procedure(s) performed: None Procedures Critical Care performed:  None ____________________________________________   INITIAL IMPRESSION / ASSESSMENT AND PLAN / ED COURSE  32 y.o. female with a history of anxiety and depression who presents for evaluation of constant chest pain and shortness of breath x 2 weeks.  Patient is extremely anxious on exam, she has a history of anxiety.  She has undergone extensive evaluation by Dr. Quentin Cornwall 9 days ago which was completely unremarkable.  She returns with similar symptoms.  I spoke with Dr. Ubaldo Glassing who has the results of patient's Holter monitor and he reported normal sinus rhythm with occasional episodes of mild sinus tachycardia but no other abnormalities.  Patient has had several instances where she pushed the button in the Holter monitor and those were all associated with normal sinus rhythm.  Patient has had 2 EKGs here that are normal with no dysrhythmias or evidence of ischemia.  She was monitored on telemetry for several hours with no evidence of any arrhythmias other than occasional rare PVCs.  Her labs including CBC, TSH,  BMP, BNP, troponin and d-dimer are all within normal limits.  Her chest x-ray does not show any evidence of pneumothorax, pneumonia, or pulmonary edema.  She is euvolemic with no signs of symptoms of cardiomyopathy.  I told patient that the d-dimer is 99% sensitive for a PE and offered a CT angiogram to rule out blood clot even though her d-dimer was negative.  Patient refused a CT at this time.  She has an appointment to  cardiology for an echocardiogram and a stress test.  I also explained to the patient did her symptoms could be due to anxiety and postpartum depression.  She does not meet IVC criteria at this time.  Patient is not very open to the suggestion that her symptoms can be due to mental health.  I encouraged her to see her psychiatrist together with her medical doctors to work and trying to get her better.  I discussed return precautions for any new or worsening chest pain, syncope, shortness of breath.  At this time there are no clinical signs or symptoms of pneumonia, cardiomyopathy, CHF, ischemia, arrhythmias, pneumothorax, PE, thyroid storm, anemia, myocarditis.       As part of my medical decision making, I reviewed the following data within the [place] notes reviewed and incorporated, Labs reviewed , EKG interpreted , Old EKG reviewed, Old chart reviewed, Radiograph reviewed , A consult was requested and obtained from this/these consultant(s) Cardiology, Notes from prior ED visits and [place] Controlled Substance Database    Pertinent labs & imaging results that were available during my care of the patient were reviewed by me and considered in my medical decision making (see chart for details).    ____________________________________________   FINAL CLINICAL IMPRESSION(S) / ED DIAGNOSES  Final diagnoses:  Chest pain, unspecified type  Shortness of breath      NEW MEDICATIONS STARTED DURING THIS VISIT:  ED Discharge Orders    None       Note:   This document was prepared using Dragon voice recognition software and may include unintentional dictation errors.    Alfred Levins, [place], MD 03/25/18 [phone]

## 2018-03-25 NOTE — ED Triage Notes (Signed)
Patient presents to the ED with chest pain and shortness of breath x 2 weeks.  Patient is 7 weeks post-partum with vaginal term delivery.  Patient reports feeling heart palpitations and her heart rate jumps up if she stands and patient reports feeling light headed when she stands.  Patient wore a halter monitor but has not yet learned the results.  Patient reports she feels her symptoms are worsening.

## 2018-04-08 ENCOUNTER — Emergency Department (HOSPITAL_COMMUNITY)
Admission: EM | Admit: 2018-04-08 | Discharge: 2018-04-08 | Disposition: A | Discharge status: Home / Self Care | Payer: 59 | Attending: Emergency Medicine | Admitting: Emergency Medicine

## 2018-04-08 ENCOUNTER — Other Ambulatory Visit: Payer: Self-pay

## 2018-04-08 ENCOUNTER — Encounter (HOSPITAL_COMMUNITY): Payer: Self-pay | Admitting: Emergency Medicine

## 2018-04-08 DIAGNOSIS — R51 Headache: Secondary | ICD-10-CM | POA: Insufficient documentation

## 2018-04-08 DIAGNOSIS — R519 Headache, unspecified: Secondary | ICD-10-CM

## 2018-04-08 MED ORDER — MAGNESIUM SULFATE 2 GM/50ML IV SOLN
2.0000 g | Freq: Once | INTRAVENOUS | Status: AC
  Administered 2018-04-08: 2 g via INTRAVENOUS
  Filled 2018-04-08: qty 50

## 2018-04-08 MED ORDER — DEXAMETHASONE SODIUM PHOSPHATE 10 MG/ML IJ SOLN
10.0000 mg | Freq: Once | INTRAMUSCULAR | Status: AC
  Administered 2018-04-08: 10 mg via INTRAVENOUS
  Filled 2018-04-08: qty 1

## 2018-04-08 MED ORDER — KETOROLAC TROMETHAMINE 30 MG/ML IJ SOLN
30.0000 mg | Freq: Once | INTRAMUSCULAR | Status: AC
  Administered 2018-04-08: 30 mg via INTRAVENOUS
  Filled 2018-04-08: qty 1

## 2018-04-08 NOTE — ED Triage Notes (Signed)
Pt to ED via [facility] -was picked up in a store parking lot- pt states that she was on the way here and vision became blurry so she called 911. C/o headache behind eyes- seen at [facility] 2 days aago for similar thing. Had a little relief from headache- "when I stand up there is more pressure in my left side of my head- behind my left eye, and left temple, going down to left base of skull"  Was prescribed Imitrex- took one dose and "it didn't work and made me feel awful- heart skipping beats, I felt weird"   Received zofran 4mg  IV enroute for nausea

## 2018-04-08 NOTE — ED Provider Notes (Signed)
[facility] EMERGENCY DEPARTMENT Provider Note   CSN: [id number] Arrival date & time: 04/08/18  1121     History   Chief Complaint Chief Complaint  Patient presents with  . Headache    HPI Brandy [name] is a 32 y.o. female.  HPI Brandy [name] is a 32 y.o. female with hx of anxiety, depression, pituitary adenoma, frequent headaches, presents to emergency department with a headache.  Patient is 9 weeks postpartum.  Patient's headache started in April.  States headaches have been intermittent, usually one side of the head.  Patient states headache however is getting worse since delivery of her daughter.  She reports associated photophobia, nausea, vomiting, intermittent visual changes and ringing in her ear.  She states pain is worsened with movement of her head.  She states she is getting plenty of sleep and currently taking Unisom which helps.  She states she is unable to perform daily activities and take care of her daughter because of this headache.  She states her mother is currently helping her taking care of her child.  She states she has seen multiple providers and has been to multiple AEDs.  Chart review shows that she had MRA without contrast of her brain 4 months ago, she had MRV and MR brain with and without contrast 1 month ago, and she was just seen in emergency department 2 days ago at [facility] and had a negative CT there.  She has tried Fioricet which she states does not help.  She is taking Imitrex which she states made her have palpitations and does not want to take it again.  She is very anxious and is concerned that this could be an aneurysm and would like to be checked for that.  She does not believe her headaches are migraines.  Past Medical History:  Diagnosis Date  . Anxiety   . Depression   . Gallstones   . History of nephrolithiasis   . Kidney stone   . Migraine   . Pituitary tumor   . Vitamin D deficiency     Patient  Active Problem List   Diagnosis Date Noted  . Headache, unspecified headache type 03/05/2018  . SVD (spontaneous vaginal delivery) 02/02/2018  . Postpartum care following vaginal delivery 02/02/2018  . Headache in pregnancy, third trimester 01/31/2018  . Class 1 obesity with body mass index (BMI) of 31.0 to 31.9 in adult 05/04/2017  . Nausea without vomiting 02/18/2017  . Abdominal pain, epigastric 02/18/2017  . Vitamin D deficiency 07/21/2016  . B12 deficiency 07/21/2016  . Generalized anxiety disorder 06/09/2016  . Anterior neck pain 12/30/2015  . Enlarged thyroid gland 12/30/2015  . Fatigue 12/30/2015  . Routine general medical examination at a health care facility 09/27/2014  . Tendinitis of right ankle 08/14/2014  . Nausea vomiting and diarrhea 12/28/2013  . Serum calcium elevated 12/28/2013  . 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 DUB (dysfunctional uterine bleeding) 06/04/2011  . GERD (gastroesophageal reflux disease) 02/19/2011  . Depression 09/06/2008  . NEPHROLITHIASIS, HX OF 12/16/2007    Past Surgical History:  Procedure Laterality Date  . CHOLECYSTECTOMY  2014  . [place], 2004  . MOUTH SURGERY    . TYMPANOSTOMY TUBE PLACEMENT       OB History    Gravida  3   Para  1   Term  1   Preterm  0   AB  1   Living  1  SAB  1   TAB  0   Ectopic  0   Multiple  0   Live Births  1        Obstetric Comments  Age first period 75         Home Medications    Prior to Admission medications   Medication Sig Start Date End Date Taking? Authorizing Provider  acetaminophen (TYLENOL) 500 MG tablet Take 1,000 mg by mouth every 6 (six) hours as needed for headache.   Yes [provider]  bismuth subsalicylate (PEPTO BISMOL) 262 MG/15ML suspension Take 30 mLs by mouth every 6 (six) hours as needed for indigestion.   Yes [provider]  butalbital-acetaminophen-caffeine (FIORICET, ESGIC) 50-325-40 MG tablet Take 1-2 tablets by  mouth every 6 (six) hours as needed for headache.    Yes [provider]  Cholecalciferol (VITAMIN D) 2000 units CAPS Take 2,000 Units by mouth daily.   Yes [provider]  clonazepam (KLONOPIN) 0.125 MG disintegrating tablet Take 0.125 mg by mouth 3 (three) times daily as needed for anxiety. 03/22/18  Yes [provider]  doxylamine, Sleep, (UNISOM) 25 MG tablet Take 25 mg by mouth at bedtime as needed for sleep.   Yes [provider]  LORazepam (ATIVAN) 1 MG tablet Take 1 mg by mouth every 6 (six) hours as needed for anxiety.   Yes [provider]  ondansetron (ZOFRAN ODT) 8 MG disintegrating tablet 8mg  ODT q8 hours prn nausea 03/07/18  Yes Palumbo, April, MD  famotidine (PEPCID) 20 MG tablet Take 1 tablet (20 mg total) by mouth 2 (two) times daily. Patient not taking: Reported on 04/08/2018 03/07/18   Palumbo, April, MD  promethazine (PHENERGAN) 25 MG tablet Take 1 tablet (25 mg total) by mouth every 8 (eight) hours as needed for up to 7 days for nausea or vomiting. 03/04/18 03/11/18  Martinique, Betty G, MD    Family History Family History  Problem Relation Age of Onset  . Diabetes Mother   . Mental illness Mother   . Asthma Mother   . Stomach cancer Mother   . Colon polyps Mother   . Heart disease Mother   . Hypertension Mother   . Arthritis Mother   . Diabetes Father   . Hypertension Father   . Colon cancer Maternal Grandmother   . Diabetes Paternal Grandmother   . Breast cancer Maternal Aunt   . Lung cancer Maternal Aunt   . Diabetes Paternal Aunt   . Diabetes Maternal Uncle   . Diabetes Maternal Uncle     Social History Social History   Tobacco Use  . Smoking status: Never Smoker  . Smokeless tobacco: Never Used  Substance Use Topics  . Alcohol use: Not Currently    Frequency: Never  . Drug use: No     Allergies   Penicillins; Reglan [metoclopramide]; and Zoloft [sertraline hcl]   Review of Systems Review of Systems    Constitutional: Negative for chills and fever.  Eyes: Positive for photophobia and visual disturbance.  Respiratory: Negative for cough, chest tightness and shortness of breath.   Cardiovascular: Negative for chest pain, palpitations and leg swelling.  Gastrointestinal: Negative for abdominal pain, diarrhea, nausea and vomiting.  Genitourinary: Negative for dysuria, flank pain, pelvic pain, vaginal bleeding, vaginal discharge and vaginal pain.  Musculoskeletal: Negative for arthralgias, myalgias, neck pain and neck stiffness.  Skin: Negative for rash.  Neurological: Positive for dizziness and headaches. Negative for weakness.  All other systems reviewed and  are negative.    Physical Exam Updated Vital Signs BP 106/68   Pulse 82   Temp 98.1 F (36.7 C) (Oral)   Resp 18   Ht 5\' 9"  (1.753 m)   Wt 86.2 kg   LMP 03/04/2018   SpO2 97%   BMI 28.06 kg/m   Physical Exam  Constitutional: She is oriented to person, place, and time. She appears well-developed and well-nourished. No distress.  HENT:  Head: Normocephalic.  Eyes: Pupils are equal, round, and reactive to light. Conjunctivae and EOM are normal.  Neck: Neck supple.  Cardiovascular: Normal rate, regular rhythm and normal heart sounds.  Pulmonary/Chest: Effort normal and breath sounds normal. No respiratory distress. She has no wheezes. She has no rales.  Abdominal: Soft. Bowel sounds are normal. She exhibits no distension. There is no tenderness. There is no rebound.  Musculoskeletal: She exhibits no edema.  Neurological: She is alert and oriented to person, place, and time.  5/5 and equal upper and lower extremity strength bilaterally. Equal grip strength bilaterally. Normal finger to nose and heel to shin. No pronator drift.   Skin: Skin is warm and dry.  Psychiatric: She has a normal mood and affect. Her behavior is normal.  Nursing note and vitals reviewed.    ED Treatments / Results  Labs (all labs ordered are  listed, but only abnormal results are displayed) Labs Reviewed - No data to display  EKG None  Radiology No results found.  Procedures Procedures (including critical care time)  Medications Ordered in ED Medications  ketorolac (TORADOL) 30 MG/ML injection 30 mg (30 mg Intravenous Given 04/08/18 1231)  dexamethasone (DECADRON) injection 10 mg (10 mg Intravenous Given 04/08/18 1219)     Initial Impression / Assessment and Plan / ED Course  I have reviewed the triage vital signs and the nursing notes.  Pertinent labs & imaging results that were available during my care of the patient were reviewed by me and considered in my medical decision making (see chart for details).     Patient in emergency department with persistent headaches that are getting worse.  Has had extensive work-up including MRA without contrast of the brain and neck 4 months ago, MRV and MR with and without contrast of the brain and neck just last month, had a negative CT 2 days ago.  Her exam is unremarkable, she is neurovascularly intact.  She is very concerned that she may have an aneurysm.  I reviewed all her prior imaging with her which show no signs of aneurysm.  I will try to get her pain under control here.  Pain only mildly improved after toradol, decadron, magnesium. Pt is very very anxious stating she is afraid of what could happen, if she can have a stroke, or ruptured aneurism, or heart attack, and that she will leave her baby without a mother. I reassured her. Pt continues to be tearful and wanting more imaging for reassurance. I do not think any more imaging indicated at this time. Home with excedrin, and follow up with psychiatry and neurology as scheduled in the next 1 and 2 weeks. Discussed keeping journal of food or any other possible triggers. Continue ativan. Return precautions discussed.   Vitals:   04/08/18 1300 04/08/18 1315 04/08/18 1330 04/08/18 1445  BP: 105/77 108/71 115/80 113/79  Pulse: 78 76  74 82  Resp:   16   Temp:      TempSrc:      SpO2: 97% 97% 98% 97%  Weight:      Height:         Final Clinical Impressions(s) / ED Diagnoses   Final diagnoses:  Headache disorder    ED Discharge Orders    None       Jeannett Senior, PA-C 04/08/18 1612    Virgel Manifold, MD 04/10/18 1055

## 2018-04-08 NOTE — Discharge Instructions (Addendum)
Your imaging and chart review shows no signs of aneurysm.  Please keep your appointment with your psychiatrist and neurologist for further follow-up and treatment of your symptoms.

## 2018-04-08 NOTE — ED Notes (Signed)
Patient states medication didn't help her head, now c/o epigastric  Pain.

## 2018-04-12 ENCOUNTER — Encounter

## 2018-04-12 ENCOUNTER — Ambulatory Visit: Payer: 59 | Admitting: Cardiology

## 2018-04-13 ENCOUNTER — Encounter: Payer: Self-pay | Admitting: *Deleted

## 2018-04-20 ENCOUNTER — Ambulatory Visit: Payer: 59 | Admitting: Family Medicine

## 2018-04-20 ENCOUNTER — Encounter: Payer: Self-pay | Admitting: Family Medicine

## 2018-04-20 ENCOUNTER — Encounter (HOSPITAL_COMMUNITY): Payer: Self-pay | Admitting: Emergency Medicine

## 2018-04-20 ENCOUNTER — Other Ambulatory Visit: Payer: Self-pay

## 2018-04-20 ENCOUNTER — Ambulatory Visit (INDEPENDENT_AMBULATORY_CARE_PROVIDER_SITE_OTHER): Payer: 59 | Admitting: Family Medicine

## 2018-04-20 ENCOUNTER — Ambulatory Visit: Payer: Self-pay | Admitting: *Deleted

## 2018-04-20 ENCOUNTER — Emergency Department (HOSPITAL_COMMUNITY): Payer: 59

## 2018-04-20 ENCOUNTER — Emergency Department (HOSPITAL_COMMUNITY)
Admission: EM | Admit: 2018-04-20 | Discharge: 2018-04-20 | Disposition: A | Discharge status: Home / Self Care | Payer: 59 | Attending: Emergency Medicine | Admitting: Emergency Medicine

## 2018-04-20 VITALS — BP 124/80 | HR 108 | Temp 98.6°F | Resp 16 | Ht 69.0 in | Wt 192.0 lb

## 2018-04-20 DIAGNOSIS — R112 Nausea with vomiting, unspecified: Secondary | ICD-10-CM | POA: Diagnosis not present

## 2018-04-20 DIAGNOSIS — F329 Major depressive disorder, single episode, unspecified: Secondary | ICD-10-CM

## 2018-04-20 DIAGNOSIS — N200 Calculus of kidney: Secondary | ICD-10-CM | POA: Diagnosis not present

## 2018-04-20 DIAGNOSIS — R519 Headache, unspecified: Secondary | ICD-10-CM

## 2018-04-20 DIAGNOSIS — Z79899 Other long term (current) drug therapy: Secondary | ICD-10-CM | POA: Diagnosis not present

## 2018-04-20 DIAGNOSIS — G4700 Insomnia, unspecified: Secondary | ICD-10-CM

## 2018-04-20 DIAGNOSIS — R319 Hematuria, unspecified: Secondary | ICD-10-CM

## 2018-04-20 DIAGNOSIS — R51 Headache: Secondary | ICD-10-CM

## 2018-04-20 DIAGNOSIS — F411 Generalized anxiety disorder: Secondary | ICD-10-CM | POA: Diagnosis not present

## 2018-04-20 LAB — CBC WITH DIFFERENTIAL/PLATELET
Abs Immature Granulocytes: 0.1 10*3/uL (ref 0.0–0.1)
Basophils Absolute: 0 10*3/uL (ref 0.0–0.1)
Basophils Relative: 0 %
EOS PCT: 0 %
Eosinophils Absolute: 0 10*3/uL (ref 0.0–0.7)
HCT: 40.4 % (ref 36.0–46.0)
HEMOGLOBIN: 12.9 g/dL (ref 12.0–15.0)
Immature Granulocytes: 1 %
LYMPHS ABS: 0.8 10*3/uL (ref 0.7–4.0)
LYMPHS PCT: 7 %
MCH: 29.6 pg (ref 26.0–34.0)
MCHC: 31.9 g/dL (ref 30.0–36.0)
MCV: 92.7 fL (ref 78.0–100.0)
MONO ABS: 0.9 10*3/uL (ref 0.1–1.0)
MONOS PCT: 8 %
Neutro Abs: 9 10*3/uL — ABNORMAL HIGH (ref 1.7–7.7)
Neutrophils Relative %: 84 %
Platelets: 289 10*3/uL (ref 150–400)
RBC: 4.36 MIL/uL (ref 3.87–5.11)
RDW: 13.6 % (ref 11.5–15.5)
WBC: 10.7 10*3/uL — ABNORMAL HIGH (ref 4.0–10.5)

## 2018-04-20 LAB — URINALYSIS, ROUTINE W REFLEX MICROSCOPIC
Bacteria, UA: NONE SEEN
Bilirubin Urine: NEGATIVE
Glucose, UA: NEGATIVE mg/dL
Ketones, ur: 5 mg/dL — AB
NITRITE: NEGATIVE
PROTEIN: 30 mg/dL — AB
RBC / HPF: >50 RBC/hpf — ABNORMAL HIGH (ref 0–5)
Specific Gravity, Urine: 1.025 (ref 1.005–1.030)
pH: 6 (ref 5.0–8.0)

## 2018-04-20 LAB — COMPREHENSIVE METABOLIC PANEL
ALK PHOS: 71 U/L (ref 38–126)
ALT: 39 U/L (ref 0–44)
ANION GAP: 10 (ref 5–15)
AST: 19 U/L (ref 15–41)
Albumin: 4 g/dL (ref 3.5–5.0)
BUN: <5 mg/dL — ABNORMAL LOW (ref 6–20)
CO2: 26 mmol/L (ref 22–32)
Calcium: 9.3 mg/dL (ref 8.9–10.3)
Chloride: 104 mmol/L (ref 98–111)
Creatinine, Ser: 1.11 mg/dL — ABNORMAL HIGH (ref 0.44–1.00)
GFR calc Af Amer: >60 mL/min (ref >60)
GFR calc non Af Amer: >60 mL/min (ref >60)
GLUCOSE: 131 mg/dL — AB (ref 70–99)
Potassium: 3.3 mmol/L — ABNORMAL LOW (ref 3.5–5.1)
Sodium: 140 mmol/L (ref 135–145)
Total Bilirubin: 0.9 mg/dL (ref 0.3–1.2)
Total Protein: 7.1 g/dL (ref 6.5–8.1)

## 2018-04-20 LAB — I-STAT BETA HCG BLOOD, ED (MC, WL, AP ONLY)

## 2018-04-20 MED ORDER — ONDANSETRON 4 MG PO TBDP
4.0000 mg | ORAL_TABLET | Freq: Once | ORAL | Status: AC | PRN
  Administered 2018-04-20: 4 mg via ORAL
  Filled 2018-04-20: qty 1

## 2018-04-20 MED ORDER — ACETAMINOPHEN 325 MG PO TABS
650.0000 mg | ORAL_TABLET | Freq: Once | ORAL | Status: AC
  Administered 2018-04-20: 650 mg via ORAL
  Filled 2018-04-20: qty 2

## 2018-04-20 MED ORDER — CEPHALEXIN 250 MG PO CAPS
500.0000 mg | ORAL_CAPSULE | Freq: Once | ORAL | Status: AC
  Administered 2018-04-20: 500 mg via ORAL
  Filled 2018-04-20: qty 2

## 2018-04-20 MED ORDER — ONDANSETRON HCL 4 MG/2ML IJ SOLN
4.0000 mg | Freq: Once | INTRAMUSCULAR | Status: AC
  Administered 2018-04-20: 4 mg via INTRAMUSCULAR

## 2018-04-20 MED ORDER — DIAZEPAM 5 MG PO TABS
5.0000 mg | ORAL_TABLET | Freq: Once | ORAL | Status: AC
  Administered 2018-04-20: 5 mg via ORAL
  Filled 2018-04-20: qty 1

## 2018-04-20 MED ORDER — SODIUM CHLORIDE 0.9 % IV BOLUS
1000.0000 mL | Freq: Once | INTRAVENOUS | Status: AC
  Administered 2018-04-20: 1000 mL via INTRAVENOUS

## 2018-04-20 MED ORDER — CEPHALEXIN 500 MG PO CAPS: 500.0000 mg | ORAL_CAPSULE | Freq: Three times a day (TID) | ORAL | 0 refills | Status: DC

## 2018-04-20 MED ORDER — ONDANSETRON 8 MG PO TBDP: 8.0000 mg | ORAL_TABLET | Freq: Three times a day (TID) | ORAL | 0 refills | Status: DC | PRN

## 2018-04-20 MED ORDER — FENTANYL CITRATE (PF) 100 MCG/2ML IJ SOLN
50.0000 ug | Freq: Once | INTRAMUSCULAR | Status: AC
  Administered 2018-04-20: 50 ug via INTRAVENOUS
  Filled 2018-04-20: qty 2

## 2018-04-20 NOTE — Telephone Encounter (Signed)
Pt called with several issues. She is 10 weeks post partum. She states that she has been having a headache for weeks. She had an epidural for her delivery.  She is wondering if she has developed a spinal leak. The pain gets better when she lies down and worst when she sits up. Neck feels a little stiff.  She has been to the Ed several times, for her headache and kidney stones. She is unable to keep anything down, taking phenergan suppository. Her temp is 102 and unable to keep any medication down. The pain is  behind her eyes and at the base of her skull, and she can hear fluid. She has not contacted her ob-gyn regarding her headache.   This is different from any migraine that she has had. Her heart rate is up to 130. She is anxious. She is with her sister and another family member is taking care of the baby.   Advised to try to lie down, with feet up, take the suppository for n/v and put a cool cloth on her face and around her neck. Try dry foods like crackers and attempt to drink liquids to prevent dehydration. She is also having some bloody urine but no pain in lower abd or flank pain.  Advised that if she feels like she is getting worse to go to ED at the closest hospital.  Flow, Sheena at [facility] notified regarding pt's symptoms and getting her appointment.  Appointment scheduled for today with her pcp.  Reason for Disposition . [1] SEVERE headache (e.g., excruciating) AND [2] not improved after 2 hours of pain medicine  Answer Assessment - Initial Assessment Questions 1. LOCATION: "Where does it hurt?"      Behind eyes and base of skull 2. ONSET: "When did the headache start?" (Minutes, hours or days)      Weeks ago 3. PATTERN: "Does the pain come and go, or has it been constant since it started?"     Pain comes and goes, when she lies down it is better and worst when she stands up 4. SEVERITY: "How bad is the pain?" and "What does it keep you from doing?"  (e.g., Scale 1-10;  mild, moderate, or severe)   - MILD (1-3): doesn't interfere with normal activities    - MODERATE (4-7): interferes with normal activities or awakens from sleep    - SEVERE (8-10): excruciating pain, unable to do any normal activities        Pain # 8 or 9 5. RECURRENT SYMPTOM: "Have you ever had headaches before?" If so, ask: "When was the last time?" and "What happened that time?"      no 6. CAUSE: "What do you think is causing the headache?"     Fluid leaking from epidural 7. MIGRAINE: "Have you been diagnosed with migraine headaches?" If so, ask: "Is this headache similar?"      Has had migraine before and this is differnent 8. HEAD INJURY: "Has there been any recent injury to the head?"      No  9. OTHER SYMPTOMS: "Do you have any other symptoms?" (fever, stiff neck, eye pain, sore throat, cold symptoms)     Fever, neck stiff some  10. PREGNANCY: "Is there any chance you are pregnant?" "When was your last menstrual period?"       LMP on July 5th had baby 10 weeks ago  Protocols used: HEADACHE-A-AH

## 2018-04-20 NOTE — Progress Notes (Signed)
ACUTE VISIT   HPI:  Chief Complaint  Patient presents with  . Headache    started several weeks ago  . Nausea  . Emesis    [name] is a 32 y.o. female, who is here today with her husband complaining of severe headache,nasuea,and vomiting.  She has follow with neurologist, last visit on 04/13/2018. She has had a few head images. Head and neck MRA of head and neck on 01/05/2017, head CT on 03/07/2018 brain MRI on 03/07/2018.  Brain MRI W WO contrast on 03/07/2018:  1. No acute intracranial abnormality. 2. Pituitary adenoma, not well assessed on this non pituitary protocol MRI. 3. Otherwise normal brain MRI. 4. Normal intracranial MRV.  No evidence for dural sinus thrombosis.  She is convinced that she has a serious illness, "I know something is wrong." She also feels "fluid moving in my spine", referring to cervical spine.  She thinks she may have a CSF leakage. She is also complaining of lower back pain around the area she had the epidural injection.  Headache is worse when she is up and alleviated when she is lying down but not resolved. Frontal, bitemporal, and occipital pressure sensation, constant. She has also has some cervical pain. In the past she has taken baclofen, she wonders if this will help this time. There is no diplopia or hemianopsia.  She has had same symptoms for about 11 weeks, after the birth of her daughter, she feels like symptoms are getting worse.  She is also complaining of palpitations, she already was evaluated by cardiologist and otherwise negative work-up.  She is not able to keep food or fluids down, she vomits every time she eats or drinks something.  States that today she has had several vomitings with bile, she denies hematemesis.  She has dry Zofran and Phenergan, she does not tolerate oral.  She has used Phenergan suppositories, she states that they do not help but her husband thinks it has helped. No abdominal pain or urinary  symptoms.  Last night she was in the ER, she was diagnosed with kidney stone.  She has fever, she was placed on Keflex.  She is also complaining of not being able to sleep at all. She is currently on Ativan up to 4 mg daily as needed.  She has history of severe anxiety and depression, symptoms getting worse. She follows with psychiatrist, next appointment next week but she is not sure about date. She is currently on Prozac 20 mg, she did not tolerate 40 mg. She also was prescribed Luvox, she discontinued after taking it for 3 days because presyncopal episode.  According to patient, she called her psychiatrist office and she was instructed to decrease dose to half but she did not resume medication.    Review of Systems  Constitutional: Positive for activity change, appetite change and fatigue. Negative for fever.  HENT: Negative for mouth sores, nosebleeds, sore throat and trouble swallowing.   Eyes: Negative for redness and visual disturbance.  Respiratory: Negative for cough, shortness of breath and wheezing.   Cardiovascular: Negative for chest pain, palpitations and leg swelling.  Gastrointestinal: Positive for nausea and vomiting. Negative for abdominal pain.       Negative for changes in bowel habits.  Genitourinary: Negative for decreased urine volume, dysuria and hematuria.  Musculoskeletal: Positive for myalgias. Negative for gait problem.  Neurological: Positive for headaches. Negative for syncope, facial asymmetry, weakness and numbness.  Psychiatric/Behavioral: Positive for sleep disturbance. Negative  for hallucinations and suicidal ideas. The patient is nervous/anxious.       Current Outpatient Medications on File Prior to Visit  Medication Sig Dispense Refill  . acetaminophen (TYLENOL) 500 MG tablet Take 1,000 mg by mouth every 6 (six) hours as needed for headache.    . bismuth subsalicylate (PEPTO BISMOL) 262 MG/15ML suspension Take 30 mLs by mouth every 6 (six) hours  as needed for indigestion.    . butalbital-acetaminophen-caffeine (FIORICET, ESGIC) 50-325-40 MG tablet Take 1-2 tablets by mouth every 6 (six) hours as needed for headache.     . cephALEXin (KEFLEX) 500 MG capsule Take 1 capsule (500 mg total) by mouth 3 (three) times daily. 21 capsule 0  . Cholecalciferol (VITAMIN D) 2000 units CAPS Take 2,000 Units by mouth daily.    . clonazepam (KLONOPIN) 0.125 MG disintegrating tablet Take 0.125 mg by mouth 3 (three) times daily as needed for anxiety.    . famotidine (PEPCID) 20 MG tablet Take 1 tablet (20 mg total) by mouth 2 (two) times daily. 30 tablet 0  . FLUoxetine (PROZAC) 20 MG capsule     . LORazepam (ATIVAN) 1 MG tablet Take 1 mg by mouth every 6 (six) hours as needed for anxiety.    . SUMAtriptan (IMITREX) 100 MG tablet sumatriptan 100 mg tablet  TK 1/2 T PO Q 2 H PRF UP TO 7 DAYS FOR MIGRAINE. MAXIMUM 200 MG PER DAY. DO NOT REPEAT MORE THAN 3 TIMES PER WEEK     No current facility-administered medications on file prior to visit.      Past Medical History:  Diagnosis Date  . Anxiety   . Depression   . Gallstones   . History of nephrolithiasis   . Kidney stone   . Migraine   . Pituitary tumor   . Vitamin D deficiency    Allergies  Allergen Reactions  . Metoclopramide Palpitations    Rapid heart rate  . Penicillins Hives, Other (See Comments) and Rash    Childhood reaction Has patient had a PCN reaction causing immediate rash, facial/tongue/throat swelling, SOB or lightheadedness with hypotension: No Has patient had a PCN reaction causing severe rash involving mucus membranes or skin necrosis: No Has patient had a PCN reaction that required hospitalization No Has patient had a PCN reaction occurring within the last 10 years: No If all of the above answers are "NO", then may proceed with Cephalosporin use.  Childhood reaction Has patient had a PCN reaction causing immediate rash, facial/tongue/throat swelling, SOB or  lightheadedness with hypotension: No Has patient had a PCN reaction causing severe rash involving mucus membranes or skin necrosis: No Has patient had a PCN reaction that required hospitalization No Has patient had a PCN reaction occurring within the last 10 years: No If all of the above answers are "NO", then may proceed with Cephalosporin use.  Brandy [name] [Sertraline Hcl] Anxiety   Family History  Problem Relation Age of Onset  . Diabetes Mother   . Mental illness Mother   . Asthma Mother   . Stomach cancer Mother   . Colon polyps Mother   . Heart disease Mother   . Hypertension Mother   . Arthritis Mother   . Diabetes Father   . Hypertension Father   . Colon cancer Maternal Grandmother   . Diabetes Paternal Grandmother   . Breast cancer Maternal Aunt   . Lung cancer Maternal Aunt   . Diabetes Paternal Aunt   . Diabetes Maternal Uncle   .  Diabetes Maternal Uncle     Social History   Socioeconomic History  . Marital status: Married    Spouse name: Not on file  . Number of children: 0  . Years of education: Not on file  . Highest education level: Not on file  Occupational History  . Occupation: Consulting civil engineer  Social Needs  . Financial resource strain: Not on file  . Food insecurity:    Worry: Not on file    Inability: Not on file  . Transportation needs:    Medical: Not on file    Non-medical: Not on file  Tobacco Use  . Smoking status: Never Smoker  . Smokeless tobacco: Never Used  Substance and Sexual Activity  . Alcohol use: Not Currently    Frequency: Never  . Drug use: No  . Sexual activity: Yes  Lifestyle  . Physical activity:    Days per week: Not on file    Minutes per session: Not on file  . Stress: Not on file  Relationships  . Social connections:    Talks on phone: Not on file    Gets together: Not on file    Attends religious service: Not on file    Active member of club or organization: Not on file    Attends meetings of clubs or  organizations: Not on file    Relationship status: Not on file  Other Topics Concern  . Not on file  Social History Narrative  . Not on file    Vitals:   04/20/18 1514  BP: 124/80  Pulse: (!) 108  Resp: 16  Temp: 98.6 F (37 C)   Body mass index is 28.35 kg/m.    Physical Exam  Nursing note and vitals reviewed. Constitutional: She is oriented to person, place, and time. She appears well-developed. No distress.  HENT:  Head: Normocephalic and atraumatic.  Mouth/Throat: Oropharynx is clear and moist. Mucous membranes are dry.  Eyes: Pupils are equal, round, and reactive to light. Conjunctivae are normal.  Cardiovascular: Regular rhythm. Tachycardia present.  No murmur heard. Pulses:      Dorsalis pedis pulses are 2+ on the right side, and 2+ on the left side.  Respiratory: Effort normal and breath sounds normal. No respiratory distress.  GI: Soft. She exhibits no mass. There is no hepatomegaly. There is no tenderness.  Musculoskeletal: She exhibits no edema.  Lymphadenopathy:    She has no cervical adenopathy.  Neurological: She is alert and oriented to person, place, and time. She has normal strength. No cranial nerve deficit. Gait normal.  Skin: Skin is warm. No rash noted. No erythema.  Psychiatric: Her mood appears anxious. Her affect is labile. She expresses no suicidal ideation.  Fairly groomed, poor eye contact.      ASSESSMENT AND PLAN:   Ms. Kristee was seen today for headache, nausea and emesis.  Diagnoses and all orders for this visit:  Insomnia, unspecified type  Seroquel 50 mg ,which she has at home may help with sleep. Good sleep hygiene.  Headache, unspecified headache type  She is convinced she has a CSF leakage. Tried to reassured her. ? Tension headache. Lack of sleep could also be aggravating problem. She has had brain imaging and negative otherwise. She also saw neurologists. Recommend following with neurologist.  Nausea and vomiting  in adult  Here in the office after verbal consent she received Zofran 4 mg Im.She will continue Zofran sl tid. Small and frequent sips of clear fluids q 10  min recommended. Instructed about warning signs.  -     ondansetron (ZOFRAN) injection 4 mg -     ondansetron (ZOFRAN ODT) 8 MG disintegrating tablet; Take 1 tablet (8 mg total) by mouth every 8 (eight) hours as needed for nausea or vomiting.  Generalized anxiety disorder  No changes in current management. Continue following with psychiatrist,she has an appt next week.  Major depressive disorder with current active episode, unspecified depression episode severity, unspecified whether recurrent  She has Seroquel at home, recommend starting 25 mg (1/2 tab) and increase dose in 3-4 days to 50 mg. No changes in Prozac. She must keep appt with psych.    40 min face to face OV. > 50% was dedicated to discussion of all above Dx, prognosis, treatment options, and some side effects of medications.She is convince "something is wrong" and nobody has been able to find it. She is afraid of dying and frustrated because she "cannot even take care of my baby." She needs a lot of reassurance from me as well from her husband.    Return if symptoms worsen or fail to improve.     Betty G. Martinique, MD  [facility]. [place] office.

## 2018-04-20 NOTE — ED Notes (Signed)
Patient transported to CT 

## 2018-04-20 NOTE — Discharge Instructions (Signed)
Can try taking unisom with your low dose melatonin to see if this helps.  Can also try some night time teas with chamomile.  If still having trouble sleeping, talk with your doctor about potentially starting a sleep aid.  Try to relax, limit cell phone, TV, computer etc as this can cause some eye strain which can worsen headaches. Can take your home percocet for pain control.  Continue flomax as well. Follow-up with urology if any issues related to kidney stone.   Follow-up with your primary care doctor/neurologist if continued issues with headaches. Please return here for any new/acute changes.

## 2018-04-20 NOTE — Patient Instructions (Signed)
A few things to remember from today's visit:   Generalized anxiety disorder  Headache, unspecified headache type  Postpartum depression  Insomnia, unspecified type  Nausea and vomiting in adult  Continue following with psychiatrist and psychotherapist.  Zofran injectable may help. Clear fluids small sips.  Seroquel at night, start with 25 mg and titrate up as tolerated.   Please be sure medication list is accurate. If a new problem present, please set up appointment sooner than planned today.

## 2018-04-20 NOTE — ED Provider Notes (Signed)
[facility] Provider Note   CSN: [id number] Arrival date & time: 04/20/18  0123     History   Chief Complaint Chief Complaint  Patient presents with  . Hematuria    HPI Brandy [name] is a 32 y.o. female.  The history is provided by the patient and medical records.  Hematuria     32 year old female with history of anxiety, depression, kidney stones, migraine headaches, vitamin D deficiency, presenting to the ED with hematuria.  States this has been intermittent for the past 3 weeks.  When occurring blood is bright red/pinkish in color with some small clots intermixed at times.  States she has been having some left-sided flank pain.  Was told that she had several kidney stones in both of her kidneys.  She has required lithotripsy in the past.  Reports some nausea today tolerating p.o.  Denies any dysuria but does report pressure" when urinating.  No pelvic pain or vaginal discharge.  Patient also with some concerns about headache.  This is been an ongoing issue for several months.  Patient states she had a headache on almost a nearly daily basis behind her eye.  States better with lying down, worse with sitting up.  She denies any dizziness, confusion, focal numbness or weakness.  Patient has had extensive work-up for this over the past several months including CT head, MR/MRA head/neck, MR brain w/ and without contrast as well as MR/MRV.  She has followed up with neurology, felt this was migraines and possibly some psychogenic component.  Has tried imitrex without relief.  Patient reports she is very worried and anxious that "her head is going to blow up and she will die leaving her child without a mother".  Admits she has been crying most of the day today, has not had hardly any sleep the past few days per mom.  She does taken ativan PRN as well as daily prozac for her anxiety, but states she can never "turn her mind off".  Past Medical History:    Diagnosis Date  . Anxiety   . Depression   . Gallstones   . History of nephrolithiasis   . Kidney stone   . Migraine   . Pituitary tumor   . Vitamin D deficiency     Patient Active Problem List   Diagnosis Date Noted  . Headache, unspecified headache type 03/05/2018  . SVD (spontaneous vaginal delivery) 02/02/2018  . Postpartum care following vaginal delivery 02/02/2018  . Headache in pregnancy, third trimester 01/31/2018  . Class 1 obesity with body mass index (BMI) of 31.0 to 31.9 in adult 05/04/2017  . Nausea without vomiting 02/18/2017  . Abdominal pain, epigastric 02/18/2017  . Vitamin D deficiency 07/21/2016  . B12 deficiency 07/21/2016  . Generalized anxiety disorder 06/09/2016  . Anterior neck pain 12/30/2015  . Enlarged thyroid gland 12/30/2015  . Fatigue 12/30/2015  . Routine general medical examination at a health care facility 09/27/2014  . Tendinitis of right ankle 08/14/2014  . Nausea vomiting and diarrhea 12/28/2013  . Serum calcium elevated 12/28/2013  . 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 DUB (dysfunctional uterine bleeding) 06/04/2011  . GERD (gastroesophageal reflux disease) 02/19/2011  . Depression 09/06/2008  . NEPHROLITHIASIS, HX OF 12/16/2007    Past Surgical History:  Procedure Laterality Date  . CHOLECYSTECTOMY  2014  . [place], 2004  . MOUTH SURGERY    . TYMPANOSTOMY TUBE PLACEMENT       OB History  Gravida  3   Para  1   Term  1   Preterm  0   AB  1   Living  1     SAB  1   TAB  0   Ectopic  0   Multiple  0   Live Births  1        Obstetric Comments  Age first period 68         Home Medications    Prior to Admission medications   Medication Sig Start Date End Date Taking? Authorizing Provider  acetaminophen (TYLENOL) 500 MG tablet Take 1,000 mg by mouth every 6 (six) hours as needed for headache.    [provider]  bismuth subsalicylate (PEPTO BISMOL) 262 MG/15ML suspension Take 30  mLs by mouth every 6 (six) hours as needed for indigestion.    [provider]  butalbital-acetaminophen-caffeine (FIORICET, ESGIC) 50-325-40 MG tablet Take 1-2 tablets by mouth every 6 (six) hours as needed for headache.     [provider]  Cholecalciferol (VITAMIN D) 2000 units CAPS Take 2,000 Units by mouth daily.    [provider]  clonazepam (KLONOPIN) 0.125 MG disintegrating tablet Take 0.125 mg by mouth 3 (three) times daily as needed for anxiety. 03/22/18   [provider]  doxylamine, Sleep, (UNISOM) 25 MG tablet Take 25 mg by mouth at bedtime as needed for sleep.    [provider]  famotidine (PEPCID) 20 MG tablet Take 1 tablet (20 mg total) by mouth 2 (two) times daily. Patient not taking: Reported on 04/08/2018 03/07/18   Palumbo, April, MD  LORazepam (ATIVAN) 1 MG tablet Take 1 mg by mouth every 6 (six) hours as needed for anxiety.    [provider]  ondansetron (ZOFRAN ODT) 8 MG disintegrating tablet 8mg  ODT q8 hours prn nausea 03/07/18   Palumbo, April, MD  promethazine (PHENERGAN) 25 MG tablet Take 1 tablet (25 mg total) by mouth every 8 (eight) hours as needed for up to 7 days for nausea or vomiting. 03/04/18 03/11/18  Martinique, Betty G, MD    Family History Family History  Problem Relation Age of Onset  . Diabetes Mother   . Mental illness Mother   . Asthma Mother   . Stomach cancer Mother   . Colon polyps Mother   . Heart disease Mother   . Hypertension Mother   . Arthritis Mother   . Diabetes Father   . Hypertension Father   . Colon cancer Maternal Grandmother   . Diabetes Paternal Grandmother   . Breast cancer Maternal Aunt   . Lung cancer Maternal Aunt   . Diabetes Paternal Aunt   . Diabetes Maternal Uncle   . Diabetes Maternal Uncle     Social History Social History   Tobacco Use  . Smoking status: Never Smoker  . Smokeless tobacco: Never Used  Substance Use Topics  . Alcohol use: Not Currently     Frequency: Never  . Drug use: No     Allergies   Penicillins; Reglan [metoclopramide]; and Zoloft [sertraline hcl]   Review of Systems Review of Systems  Gastrointestinal: Positive for nausea and vomiting.  Genitourinary: Positive for flank pain and hematuria.  All other systems reviewed and are negative.    Physical Exam Updated Vital Signs BP 114/86   Pulse (!) 120   Temp (!) 100.4 F (38 C) (Oral)   Resp 20   LMP 05/13/2017   SpO2 99%   Physical Exam  Constitutional: She is oriented to person, place, and time. She appears well-developed and well-nourished. No distress.  HENT:  Head: Normocephalic and atraumatic.  Right Ear: Tympanic membrane and external ear normal.  Left Ear: Tympanic membrane and external ear normal.  Nose: Nose normal.  Mouth/Throat: Uvula is midline and oropharynx is clear and moist. Mucous membranes are dry.  Eyes: Pupils are equal, round, and reactive to light. Conjunctivae and EOM are normal.  Neck: Normal range of motion and full passive range of motion without pain. Neck supple. No neck rigidity.  No rigidity, no meningismus  Cardiovascular: Normal rate, regular rhythm and normal heart sounds.  No murmur heard. Pulmonary/Chest: Effort normal and breath sounds normal. No stridor. No respiratory distress. She has no wheezes. She has no rhonchi.  Abdominal: Soft. Bowel sounds are normal. There is no tenderness. There is no rebound and no guarding.  Musculoskeletal: Normal range of motion. She exhibits no edema.  Neurological: She is alert and oriented to person, place, and time. She has normal strength. She displays no tremor. No cranial nerve deficit or sensory deficit. She displays no seizure activity.  AAOx3, answering questions and following commands appropriately; equal strength UE and LE bilaterally; CN grossly intact; moves all extremities appropriately without ataxia; no focal neuro deficits or facial asymmetry appreciated  Skin: Skin is  warm and dry. No rash noted. She is not diaphoretic.  Psychiatric: Her behavior is normal. Thought content normal. Her mood appears anxious.  Very anxious but redirectable, ruminating thoughts about aneurysms  Nursing note and vitals reviewed.    ED Treatments / Results  Labs (all labs ordered are listed, but only abnormal results are displayed) Labs Reviewed  CBC WITH DIFFERENTIAL/PLATELET - Abnormal; Notable for the following components:      Result Value   WBC 10.7 (*)    Neutro Abs 9.0 (*)    All other components within normal limits  COMPREHENSIVE METABOLIC PANEL - Abnormal; Notable for the following components:   Potassium 3.3 (*)    Glucose, Bld 131 (*)    BUN <5 (*)    Creatinine, Ser 1.11 (*)    All other components within normal limits  URINALYSIS, ROUTINE W REFLEX MICROSCOPIC - Abnormal; Notable for the following components:   APPearance HAZY (*)    Hgb urine dipstick MODERATE (*)    Ketones, ur 5 (*)    Protein, ur 30 (*)    Leukocytes, UA TRACE (*)    RBC / HPF >50 (*)    All other components within normal limits  URINE CULTURE  I-STAT BETA HCG BLOOD, ED ([facility], [facility], [facility] ONLY)    EKG EKG Interpretation  Date/Time:  Wednesday April 20 2018 01:26:36 EDT Ventricular Rate:  147 PR Interval:  130 QRS Duration: 78 QT Interval:  258 QTC Calculation: 403 R Axis:   65 Text Interpretation:  Sinus tachycardia Confirmed by Dory Horn) on 04/20/2018 2:23:11 AM   Radiology Ct Renal Stone Study  Result Date: 04/20/2018 CLINICAL DATA:  Intermittent hematuria for 3 weeks, vomiting and fever tonight. History of kidney stones, cholecystectomy. EXAM: CT ABDOMEN AND PELVIS WITHOUT CONTRAST TECHNIQUE: Multidetector CT imaging of the abdomen and pelvis was performed following the standard protocol without IV contrast. COMPARISON:  CT abdomen and pelvis March 23, 2018 FINDINGS: LOWER CHEST: 3 mm LEFT lower lobe subsolid subpleural pulmonary nodule, no indicated  follow-up.This recommendation follows the consensus statement: Guidelines for Management of Incidental Pulmonary Nodules Detected on CT Images: From the [employer]  2017; Radiology 2017; [id number]. HEPATOBILIARY: Status post cholecystectomy. Negative non-contrast CT liver. PANCREAS: Normal. SPLEEN: Normal. ADRENALS/URINARY TRACT: Kidneys are orthotopic, demonstrating normal size and morphology. Mild LEFT hydroureteronephrosis to the level of the distal ureter where 4 mm calculus is present, migrated from prior CT. Bilateral nephrolithiasis measuring to 2 mm. Limited assessment for renal masses by nonenhanced CT. The unopacified ureters are normal in course and caliber. Urinary bladder is partially distended and unremarkable. Normal adrenal glands. STOMACH/BOWEL: The stomach, small and large bowel are normal in course and caliber without inflammatory changes, sensitivity decreased by lack of enteric contrast. Normal appendix. VASCULAR/LYMPHATIC: Aortoiliac vessels are normal in course and caliber. No lymphadenopathy by CT size criteria. REPRODUCTIVE: Normal. OTHER: No intraperitoneal free fluid or free air. MUSCULOSKELETAL: Non-acute.  Small fat containing umbilical hernia. IMPRESSION: 1. Migrated 4 mm LEFT urolithiasis in distal LEFT ureter resulting in mild obstructive uropathy. 2. Bilateral nephrolithiasis measuring to 2 mm. Electronically Signed   By: Elon Alas M.D.   On: 04/20/2018 04:20    Procedures Procedures (including critical care time)  Medications Ordered in ED Medications  ondansetron (ZOFRAN-ODT) disintegrating tablet 4 mg (4 mg Oral Given 04/20/18 0150)  sodium chloride 0.9 % bolus 1,000 mL (0 mLs Intravenous Stopped 04/20/18 0344)  acetaminophen (TYLENOL) tablet 650 mg (650 mg Oral Given 04/20/18 0328)  diazepam (VALIUM) tablet 5 mg (5 mg Oral Given 04/20/18 0328)  fentaNYL (SUBLIMAZE) injection 50 mcg (50 mcg Intravenous Given 04/20/18 0514)  cephALEXin (KEFLEX) capsule 500  mg (500 mg Oral Given 04/20/18 0514)     Initial Impression / Assessment and Plan / ED Course  I have reviewed the triage vital signs and the nursing notes.  Pertinent labs & imaging results that were available during my care of the patient were reviewed by me and considered in my medical decision making (see chart for details).  33 y.o. F here with flank pain and intermittent hematuria for the past 3 weeks.  Reports she has a history of kidney stones.  She has low-grade fever and tachycardia on arrival here but is nontoxic in appearance.  Does have some left CVA tenderness, remainder of abdomen is soft and benign.  Labs overall reassuring.  UA with blood but no bacteria noted.  Does have 6-10 white cells, however also has 6-10 squamous so likely some contamination.  CT renal study with distal movement of stone measuring 4 mm.  She has very mild left hydronephrosis.  After Tylenol and fluids here, HR now WNL at 99 during repeat exam.  Discussed continued supportive care measures including pain and nausea control, continue Flomax at home.  Given low-grade fever, will cover with Keflex.  Close follow-up with urology.  In regards to her headache, I feel a lot of this is related to her anxiety-- patient herself does acknowledge this.  She has a nonfocal neurologic exam.  No signs/symptoms concerning for meningitis.  I have had a extensive conversation with patient and family at the bedside.  We have gone over all her prior imaging results, neurology visits, and her symptoms.  I do not see any indication for repeat imaging of her brain or further work-up for headache at this time.  She seems to be ruminating on the idea of having an aneurysm rupture, however there was no evidence of aneurysm on her prior imaging studies.  She also has not had any significant sleep in the past few days and has been on her cell phone almost non-stop looking up her symptoms  on google--we discussed that lack of sleep as well as  excessive cell phone/ipad/computer use causing eye strain may very well be contributing to her headaches.  She has tried melatonin and unisom for sleep, advised she may wish to try some night time tea w/chamomile to see if this can help.  If still unable to sleep, may wish to speak with her doctor about starting a night time sleep aid.   Can also follow-up with her neurologist if ongoing issues with headaches.  Patient states she feels better after her ED visit today and talking through her symptoms.  She seems much more calm at time of discharge.  She understands importance of follow-up with her specialists.  She will return here for any new/acute changes.  Final Clinical Impressions(s) / ED Diagnoses   Final diagnoses:  Kidney stone  Hematuria, unspecified type  Bad headache    ED Discharge Orders         Ordered    cephALEXin (KEFLEX) 500 MG capsule  3 times daily     04/20/18 0526           Larene Pickett, PA-C 04/20/18 0550    Palumbo, April, MD 04/20/18 [phone]

## 2018-04-20 NOTE — ED Triage Notes (Signed)
Patient report intermittent hematuria for 3 weeks with emesis and fever this evening , history of kidney stones , pt. added head pressure this evening .

## 2018-04-20 NOTE — ED Notes (Signed)
Pt vomiting, given Zofran per standing order. Pt inquiring about wait time. Pt informed of longest wait time.

## 2018-04-21 LAB — URINE CULTURE

## 2018-04-26 NOTE — Progress Notes (Signed)
NEUROLOGY CONSULTATION NOTE  Brandy [name] MRN: [id number] DOB: 1986/01/01  Referring provider: Betty Martinique, MD Primary care provider: Betty Martinique, MD  Reason for consult:  headache  HISTORY OF PRESENT ILLNESS: Brandy [name] is a 32 year old right-handed female with depression, anxiety, pituitary tumor and kidney stones who presents for headache.  History supplemented by ED and referring provider's notes.  Onset:  April 2019, during first trimester of first pregnancy.  She had her child 02/02/18.  They have been worse since the birth.  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  Eye exam was normal.  Labs/hormones were unremarkable.  Repeat imaging since birth of her child has shown that it has decreased in size.  She has had several ED visits over the past month.  MRA of head and neck from 01/05/18 personally reviewed and was negative.  MRI of brain with and without contrast from 03/07/18 was personally reviewed and demonstrated known pituitary adenoma but otherwise no acute process.  MRV of head performed the same day was negative for dural sinus thrombosis.  Location:  Left sided, left retro-orbital/temporal/occipital, but now bilateral Quality:  Pressure, "weird sensations throughout head" Intensity:  Severe.  She denies thunderclap headache. Aura:  no Prodrome:  no Postdrome:  no Associated symptoms:  Nausea, vomiting, bilateral eye lacrimation.  She denies associated, photophobia, phonophobia, unilateral numbness or weakness. Duration:  Often wakes up with it.  Off and on throughout the day. Frequency:  daily Frequency of abortive medication: none Triggers:  no Exacerbating factors:  Standing up, exertion Relieving factors:  Laying down Activity:  aggravates  Current NSAIDS:  no Current analgesics:  no Current triptans:  no Current ergotamine:  no Current anti-emetic:  Zofran ODT 8mg  Current muscle relaxants:  no Current anti-anxiolytic:  Ativan 1mg   QID PRN Current sleep aide:  no Current Antihypertensive medications:  no Current Antidepressant medications:  fluoxetine 20mg  Current Anticonvulsant medications:  no Current anti-CGRP:  no Current Vitamins/Herbal/Supplements:  no Current Antihistamines/Decongestants:  no Other therapy:  no Other medication:  no  Past NSAIDS:  Ibuprofen, naproxen Past analgesics:  Fioricet (ineffective), Excedrin Migraine, Tylenol Past abortive triptans:  Sumatriptan tablet (made her feel awful, palpitations) Past abortive ergotamine:  no Past muscle relaxants:   no Past anti-emetic:  no Past antihypertensive medications:  no Past antidepressant medications:  no Past anticonvulsant medications:  no Past anti-CGRP:  no Past vitamins/Herbal/Supplements:  no Past antihistamines/decongestants:  no Other past therapies:  no  Caffeine:  no Alcohol:  no Smoker:   no Diet:  hydrates Exercise:  Not at this time Depression:  Some postpartum depression; Anxiety:  Yes Sleep:  poor No prior history of headache.  She is not breastfeeding Family history of headache:  no  04/20/18 LABS:  CBC with WBC 10.7, HGB 12.9, HCT 40.4, PLT 289; CMP with Na 140, K 3.3, glucose 131, BUN <5, Cr 1.11, t bili 0.9, AST 19, ALT 39.  PAST MEDICAL HISTORY: Past Medical History:  Diagnosis Date  . Anxiety   . Depression   . Gallstones   . History of nephrolithiasis   . Kidney stone   . Migraine   . Pituitary tumor   . Vitamin D deficiency     PAST SURGICAL HISTORY: Past Surgical History:  Procedure Laterality Date  . CHOLECYSTECTOMY  2014  . [place], 2004  . MOUTH SURGERY    . TYMPANOSTOMY TUBE PLACEMENT      MEDICATIONS: Current Outpatient  Medications on File Prior to Visit  Medication Sig Dispense Refill  . acetaminophen (TYLENOL) 500 MG tablet Take 1,000 mg by mouth every 6 (six) hours as needed for headache.    . bismuth subsalicylate (PEPTO BISMOL) 262 MG/15ML suspension Take 30 mLs  by mouth every 6 (six) hours as needed for indigestion.    . butalbital-acetaminophen-caffeine (FIORICET, ESGIC) 50-325-40 MG tablet Take 1-2 tablets by mouth every 6 (six) hours as needed for headache.     . cephALEXin (KEFLEX) 500 MG capsule Take 1 capsule (500 mg total) by mouth 3 (three) times daily. 21 capsule 0  . Cholecalciferol (VITAMIN D) 2000 units CAPS Take 2,000 Units by mouth daily.    . clonazepam (KLONOPIN) 0.125 MG disintegrating tablet Take 0.125 mg by mouth 3 (three) times daily as needed for anxiety.    . famotidine (PEPCID) 20 MG tablet Take 1 tablet (20 mg total) by mouth 2 (two) times daily. 30 tablet 0  . FLUoxetine (PROZAC) 20 MG capsule     . LORazepam (ATIVAN) 1 MG tablet Take 1 mg by mouth every 6 (six) hours as needed for anxiety.    . ondansetron (ZOFRAN ODT) 8 MG disintegrating tablet Take 1 tablet (8 mg total) by mouth every 8 (eight) hours as needed for nausea or vomiting. 20 tablet 0  . SUMAtriptan (IMITREX) 100 MG tablet sumatriptan 100 mg tablet  TK 1/2 T PO Q 2 H PRF UP TO 7 DAYS FOR MIGRAINE. MAXIMUM 200 MG PER DAY. DO NOT REPEAT MORE THAN 3 TIMES PER WEEK     No current facility-administered medications on file prior to visit.     ALLERGIES: Allergies  Allergen Reactions  . Metoclopramide Palpitations    Rapid heart rate  . Penicillins Hives, Other (See Comments) and Rash    Childhood reaction Has patient had a PCN reaction causing immediate rash, facial/tongue/throat swelling, SOB or lightheadedness with hypotension: No Has patient had a PCN reaction causing severe rash involving mucus membranes or skin necrosis: No Has patient had a PCN reaction that required hospitalization No Has patient had a PCN reaction occurring within the last 10 years: No If all of the above answers are "NO", then may proceed with Cephalosporin use.  Childhood reaction Has patient had a PCN reaction causing immediate rash, facial/tongue/throat swelling, SOB or  lightheadedness with hypotension: No Has patient had a PCN reaction causing severe rash involving mucus membranes or skin necrosis: No Has patient had a PCN reaction that required hospitalization No Has patient had a PCN reaction occurring within the last 10 years: No If all of the above answers are "NO", then may proceed with Cephalosporin use.  [place] [Sertraline Hcl] Anxiety    FAMILY HISTORY: Family History  Problem Relation Age of Onset  . Diabetes Mother   . Mental illness Mother   . Asthma Mother   . Stomach cancer Mother   . Colon polyps Mother   . Heart disease Mother   . Hypertension Mother   . Arthritis Mother   . Diabetes Father   . Hypertension Father   . Colon cancer Maternal Grandmother   . Diabetes Paternal Grandmother   . Breast cancer Maternal Aunt   . Lung cancer Maternal Aunt   . Diabetes Paternal Aunt   . Diabetes Maternal Uncle   . Diabetes Maternal Uncle    SOCIAL HISTORY: Social History   Socioeconomic History  . Marital status: Married    Spouse name: Not on file  .  Number of children: 0  . Years of education: Not on file  . Highest education level: Not on file  Occupational History  . Occupation: Consulting civil engineer  Social Needs  . Financial resource strain: Not on file  . Food insecurity:    Worry: Not on file    Inability: Not on file  . Transportation needs:    Medical: Not on file    Non-medical: Not on file  Tobacco Use  . Smoking status: Never Smoker  . Smokeless tobacco: Never Used  Substance and Sexual Activity  . Alcohol use: Not Currently    Frequency: Never  . Drug use: No  . Sexual activity: Yes  Lifestyle  . Physical activity:    Days per week: Not on file    Minutes per session: Not on file  . Stress: Not on file  Relationships  . Social connections:    Talks on phone: Not on file    Gets together: Not on file    Attends religious service: Not on file    Active member of club or organization: Not on file     Attends meetings of clubs or organizations: Not on file    Relationship status: Not on file  . Intimate partner violence:    Fear of current or ex partner: Not on file    Emotionally abused: Not on file    Physically abused: Not on file    Forced sexual activity: Not on file  Other Topics Concern  . Not on file  Social History Narrative  . Not on file    REVIEW OF SYSTEMS: Constitutional: No fevers, chills, or sweats, no generalized fatigue, change in appetite Eyes: No visual changes, double vision, eye pain Ear, nose and throat: No hearing loss, ear pain, nasal congestion, sore throat Cardiovascular: No chest pain, palpitations Respiratory:  No shortness of breath at rest or with exertion, wheezes GastrointestinaI: No nausea, vomiting, diarrhea, abdominal pain, fecal incontinence Genitourinary:  No dysuria, urinary retention or frequency Musculoskeletal:  No neck pain, back pain Integumentary: No rash, pruritus, skin lesions Neurological: as above Psychiatric: depression, anxiety Endocrine: No palpitations, fatigue, diaphoresis, mood swings, change in appetite, change in weight, increased thirst Hematologic/Lymphatic:  No purpura, petechiae. Allergic/Immunologic: no itchy/runny eyes, nasal congestion, recent allergic reactions, rashes  PHYSICAL EXAM: Blood pressure 92/66, pulse 97, height 5\' 9"  (1.753 m), weight 189 lb (85.7 kg), last menstrual period 05/13/2017, SpO2 97 %, not currently breastfeeding. General: No acute distress.  Patient appears well-groomed.   Head:  Normocephalic/atraumatic Eyes:  fundi examined but not visualized Neck: supple, no paraspinal tenderness, full range of motion Back: No paraspinal tenderness Heart: regular rate and rhythm Lungs: Clear to auscultation bilaterally. Vascular: No carotid bruits. Neurological Exam: Mental status: alert and oriented to person, place, and time, recent and remote memory intact, fund of knowledge intact, attention and  concentration intact, speech fluent and not dysarthric, language intact. Cranial nerves: CN I: not tested CN II: pupils equal, round and reactive to light, visual fields intact CN III, IV, VI:  full range of motion, no nystagmus, no ptosis CN V: facial sensation intact CN VII: upper and lower face symmetric CN VIII: hearing intact CN IX, X: gag intact, uvula midline CN XI: sternocleidomastoid and trapezius muscles intact CN XII: tongue midline Bulk & Tone: normal, no fasciculations. Motor:  5/5 throughout  Sensation: temperature and vibration sensation intact.   Deep Tendon Reflexes:  2+ throughout,  toes downgoing.   Finger to nose  testing:  Without dysmetria.   Heel to shin:  Without dysmetria.   Gait:  Normal station and stride.  Able to turn and tandem walk. Romberg negative.  IMPRESSION: 1.  Intractable persistent headache.  Likely new-onset intractable migraine, status migrainosus.  Imaging has ruled out mass lesion, cerebral aneurysm, dural sinus thrombosis or other acute intracranial/vascular etiology. Worse since giving birth, so may be underlying low-pressure (status post epidural) 2.  Pituitary adenoma, likely incidental finding.  Eye exam and labs reportedly okay.  Size on repeat imaging decreased.  PLAN: 1.  We will initiate Aimovig 70mg  injection monthly.  She is unable to take topiramate due to kidney stones and unable to take beta blocker due to baseline low blood pressure.  She is already on an antidepressant.  2.  She will try Cambia to abort headache 3.  Limit use of pain relievers to no more than 2 days out of week to prevent rebound headache. 4.  Keep headache diary 5.  Discussed to consider undergoing lumbar puncture for opening pressure and CSF analysis.  Although risk of an acute post-LP headache possible as well. 6.  Follow up in 3 months.  Thank you for allowing me to take part in the care of this patient.  [name], DO  CC: Betty Martinique, MD

## 2018-04-27 ENCOUNTER — Ambulatory Visit: Payer: 59 | Admitting: Cardiology

## 2018-04-27 ENCOUNTER — Ambulatory Visit: Payer: 59 | Admitting: Neurology

## 2018-04-27 ENCOUNTER — Encounter: Payer: Self-pay | Admitting: Neurology

## 2018-04-27 VITALS — BP 92/66 | HR 97 | Ht 69.0 in | Wt 189.0 lb

## 2018-04-27 DIAGNOSIS — G43011 Migraine without aura, intractable, with status migrainosus: Secondary | ICD-10-CM

## 2018-04-27 DIAGNOSIS — D352 Benign neoplasm of pituitary gland: Secondary | ICD-10-CM

## 2018-04-27 MED ORDER — ERENUMAB-AOOE 70 MG/ML ~~LOC~~ SOAJ: 70.0000 mg | Freq: Once | SUBCUTANEOUS | 0 refills | Status: AC

## 2018-04-27 MED ORDER — DICLOFENAC POTASSIUM(MIGRAINE) 50 MG PO PACK
50.0000 mg | PACK | ORAL | 0 refills | Status: DC
Start: 2018-04-27 — End: 2018-05-04

## 2018-04-27 NOTE — Patient Instructions (Signed)
  1.  We will start Aimovig self injection every 30 days 2.  At the onset of a severe headache, take Cambia as directed. 3.  Limit use of pain relievers to no more than 2 days out of the week.  These medications include acetaminophen, ibuprofen, triptans and narcotics.  This will help reduce risk of rebound headaches. 4.  Be aware of common food triggers such as processed sweets, processed foods with nitrites (such as deli meat, hot dogs, sausages), foods with MSG, alcohol (such as wine), chocolate, certain cheeses, certain fruits (dried fruits, bananas, some citrus fruit), vinegar, diet soda. 4.  Avoid caffeine 5.  Routine exercise 6.  Proper sleep hygiene 7.  Stay adequately hydrated with water 8.  Keep a headache diary. 9.  Maintain proper stress management. 10.  Do not skip meals. 11.  Consider supplements:  Magnesium citrate 400mg  to 600mg  daily, riboflavin 400mg , Coenzyme Q 10 100mg  three times daily 12.  Consider lumbar puncture to assess spinal fluid pressure and spinal fluid 13.  Follow up in 3 months

## 2018-05-04 ENCOUNTER — Other Ambulatory Visit (INDEPENDENT_AMBULATORY_CARE_PROVIDER_SITE_OTHER): Payer: 59

## 2018-05-04 ENCOUNTER — Ambulatory Visit (INDEPENDENT_AMBULATORY_CARE_PROVIDER_SITE_OTHER): Payer: 59 | Admitting: Internal Medicine

## 2018-05-04 ENCOUNTER — Encounter: Payer: Self-pay | Admitting: Internal Medicine

## 2018-05-04 ENCOUNTER — Telehealth: Payer: Self-pay

## 2018-05-04 VITALS — BP 102/70 | HR 72 | Ht 69.0 in | Wt 187.0 lb

## 2018-05-04 DIAGNOSIS — R1110 Vomiting, unspecified: Secondary | ICD-10-CM | POA: Diagnosis not present

## 2018-05-04 DIAGNOSIS — K594 Anal spasm: Secondary | ICD-10-CM | POA: Diagnosis not present

## 2018-05-04 DIAGNOSIS — R519 Headache, unspecified: Secondary | ICD-10-CM

## 2018-05-04 DIAGNOSIS — R1115 Cyclical vomiting syndrome unrelated to migraine: Secondary | ICD-10-CM

## 2018-05-04 DIAGNOSIS — R1013 Epigastric pain: Secondary | ICD-10-CM | POA: Diagnosis not present

## 2018-05-04 DIAGNOSIS — K582 Mixed irritable bowel syndrome: Secondary | ICD-10-CM | POA: Diagnosis not present

## 2018-05-04 DIAGNOSIS — G43011 Migraine without aura, intractable, with status migrainosus: Secondary | ICD-10-CM

## 2018-05-04 DIAGNOSIS — F418 Other specified anxiety disorders: Secondary | ICD-10-CM

## 2018-05-04 DIAGNOSIS — R112 Nausea with vomiting, unspecified: Secondary | ICD-10-CM

## 2018-05-04 DIAGNOSIS — R51 Headache: Secondary | ICD-10-CM

## 2018-05-04 LAB — CORTISOL: Cortisol, Plasma: 12.4 ug/dL

## 2018-05-04 LAB — BASIC METABOLIC PANEL
BUN: 13 mg/dL (ref 6–23)
CALCIUM: 9.8 mg/dL (ref 8.4–10.5)
CO2: 29 mEq/L (ref 19–32)
CREATININE: 1.1 mg/dL (ref 0.40–1.20)
Chloride: 105 mEq/L (ref 96–112)
GFR: 61.5 mL/min (ref >60.00)
GLUCOSE: 111 mg/dL — AB (ref 70–99)
Potassium: 4.1 mEq/L (ref 3.5–5.1)
Sodium: 142 mEq/L (ref 135–145)

## 2018-05-04 MED ORDER — ONDANSETRON 8 MG PO TBDP: 8.0000 mg | ORAL_TABLET | Freq: Three times a day (TID) | ORAL | 0 refills | Status: DC | PRN

## 2018-05-04 NOTE — Telephone Encounter (Signed)
Pt called office c/o of daily headache. She tried the [place] last week, it was not effective. Pain is only in the left temple when she stands or exerts herself. It is intense pain.  She was seen by GI to day for abdominal pain, vomiting and IBS. I advised her not to take the Cambia again until this resolves, as it is an NSAID and could potentially worsen the GI symptoms.  Pt would like to know what else she may try for the intense pain.

## 2018-05-04 NOTE — Patient Instructions (Addendum)
  Your provider has requested that you go to the basement level for lab work before leaving today. Press "B" on the elevator. The lab is located at the first door on the left as you exit the elevator.   You have been scheduled for an endoscopy. Please follow written instructions given to you at your visit today. If you use inhalers (even only as needed), please bring them with you on the day of your procedure. Your physician has requested that you go to www.startemmi.com and enter the access code given to you at your visit today. This web site gives a general overview about your procedure. However, you should still follow specific instructions given to you by our office regarding your preparation for the procedure.  We have sent the following medications to your pharmacy for you to pick up at your convenience: Zofran   Please start Benefiber, handout provided.    I appreciate the opportunity to care for you. Silvano Rusk,

## 2018-05-04 NOTE — Progress Notes (Addendum)
Brandy [name] 32 y.o. [birth year] [id number]  Assessment & Plan:   Encounter Diagnoses  Name Primary?  . Persistent vomiting Yes  . Abdominal pain, epigastric   . Irritable bowel syndrome with both constipation and diarrhea   . Proctalgia fugax   . Anxiety about health   . Nausea and vomiting in adult   . Intractable headache, unspecified chronicity pattern, unspecified headache type     Multiple gastrointestinal symptoms, probably functional in origin.  There is significant anxiety overlay.  Headache may be a source of her nausea as well.  Gastrointestinal pathology not ruled out at this point.  She has a small pituitary adenoma it appears.  Very unlikely but wonder if she could have adrenal insufficiency, secondary.  I did check a cortisol level but it came back at an acceptable level.  ALSO NOTE A PITUITARY ADENOMA PROTOCOL MR AT [facility]5 REPORTED NO ADENOMA BUT PHYSIOLOGIC UPPER LIMIT NL PITUITARY The rectal symptoms are pretty classic for proctalgia fugax.  Basic metabolic panel was checked due to mildly elevated creatinine and hypokalemia on August 21.  And was normal other than a glucose of 111 nonfasting.  She has postpartum depression as well.  I doubt there is a significant gastrointestinal problem here but I think given the chronicity and persistence of her symptoms and EGD to rule out something that would cause it i.e. ulcer causing gastric outlet obstruction is appropriate.  If that is negative would give strong consideration to something like Remeron treatment for the nausea and vomiting if other physicians agree. I doubt this is gastroparesis. Could consider gastric emptying study but she has palpitations with metaclopramide and not sure it would change Tx  She will continue ondansetron which is only partially helpful  I am not available for some time to perform the EGD due to my schedule, so Dr. Irene Limbo he has graciously agreed to perform her procedure and she  accepts. I will provide follow-up. The risks and benefits as well as alternatives of endoscopic procedure(s) have been discussed and reviewed. All questions answered. The patient agrees to proceed.  I appreciate the opportunity to care for her. ZD:GLOVFI, Malka So, MD  Subjective:   Chief Complaint: Numerous, persistent vomiting main 1  HPI The patient here with complaints of persistent vomiting and multiple other symptoms.  She was seen in August 2018 by Alonza Bogus, PA because of epigastric pain and some intermittent nausea and vomiting.  She was treated with PPI and H2 blocker and H. pylori was checked and was negative.  She ended up becoming pregnant and delivered a healthy baby earlier this year about 3 months ago.  She said she had a lot of vomiting in the first and third trimester but 3 months postpartum she is still vomiting.  She has intermittent pains in the chest and under the left breast that are sharp or stinging.  She has had cardiac evaluation reviewed in the chart, and it was negative.  In July she was in the ER and there was a question if she might have had seizures associated with her problems it was thought that she did not have seizures, she had a headache with pressure behind the eyes and was noted to be depressed and an MRI was unrevealing though it showed a stable pituitary adenoma.  She is also seen primary care, thyroid ultrasound has been ordered by her endocrinologist has a stable left thyroid nodule.  Brain CT has been negative.  Back in the ER through  EMS with weakness and joint pain and the headache.  She had stopped her Seroquel which was prescribed for postpartum depression.  This was actually when she had the ER neurology eval.  Back in the ER at [facility] with chest pain and shortness of breath July 26.  Another ER visit 8 9 that was for headache.  Then had hematuria and was seen on August 21 in the ER. Back to Dr. Martinique of primary care August 21.  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  Ondansetron prescribed.  Saw Dr. Tomi Likens August 28.  He noted that magnetic resonance venography of the brain had ruled out a dural sinus thrombosis.  Complaining of left sided headaches with retro-orbital occipital pain originally and then bilateral and "weird sensations throughout the head".  No thunderclap headache.  He initiated Aimovig she was unable to take topiramate due to kidney stones and unable to take a beta-blocker due to baseline low blood pressure.  He recommended Cambia to try to abort the headache.  So far this is not been helpful and she continues to have the headaches.  He is leaving open the possibility of an LP to check pressure and  CSF analysis.  Other symptoms are as follows listed below: Pain in chest and under L breast   pc dyspnea Regurgitation of liquid and foods. Takes Zofran so I can eat - gets constipated "Weird abd pains "  Anal spasm, sharp pains clenching like lasting seconds to minutes. +/- awake not with defecation  Bowels go from loose to days w/o and straining/firm.  Mother had stomach cancer  Not breast feeding Baby is well Allergies  Allergen Reactions  . Metoclopramide Palpitations    Rapid heart rate  . Penicillins Hives, Other (See Comments) and Rash    Childhood reaction Has patient had a PCN reaction causing immediate rash, facial/tongue/throat swelling, SOB or lightheadedness with hypotension: No Has patient had a PCN reaction causing severe rash involving mucus membranes or skin necrosis: No Has patient had a PCN reaction that required hospitalization No Has patient had a PCN reaction occurring within the last 10 years: No If all of the above answers are "NO", then may proceed with Cephalosporin use.  Childhood reaction Has patient had a PCN reaction causing immediate rash, facial/tongue/throat swelling, SOB or lightheadedness with hypotension: No Has patient had a PCN reaction causing severe rash involving mucus  membranes or skin necrosis: No Has patient had a PCN reaction that required hospitalization No Has patient had a PCN reaction occurring within the last 10 years: No If all of the above answers are "NO", then may proceed with Cephalosporin use.  Marland Kitchen Zoloft [Sertraline Hcl] Anxiety   Current Meds  Medication Sig  . acetaminophen (TYLENOL) 500 MG tablet Take 1,000 mg by mouth every 6 (six) hours as needed for headache.  . Cholecalciferol (VITAMIN D) 2000 units CAPS Take 2,000 Units by mouth daily.  Marland Kitchen FLUoxetine (PROZAC) 20 MG capsule   . LORazepam (ATIVAN) 1 MG tablet Take 1 mg by mouth every 6 (six) hours as needed for anxiety.  . ondansetron (ZOFRAN ODT) 8 MG disintegrating tablet Take 1 tablet (8 mg total) by mouth every 8 (eight) hours as needed for nausea or vomiting.  . [DISCONTINUED] ondansetron (ZOFRAN ODT) 8 MG disintegrating tablet Take 1 tablet (8 mg total) by mouth every 8 (eight) hours as needed for nausea or vomiting.   Past Medical History:  Diagnosis Date  . Anxiety   . Depression   .  Gallstones   . History of nephrolithiasis   . Kidney stone   . Migraine   . Pituitary tumor   . Vitamin D deficiency    Past Surgical History:  Procedure Laterality Date  . CHOLECYSTECTOMY  2014  . [place], 2004  . MOUTH SURGERY    . TYMPANOSTOMY TUBE PLACEMENT     Social History   Social History Narrative   Patient is right-handed. She lives with her husband and child in a split-level home.    Daughter born 01/2018   She avoids caffeine.    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   family history includes Anxiety disorder in her sister; Arthritis in her mother; Asthma in her mother; Breast cancer in her maternal aunt; Colon cancer in her maternal grandmother; Colon polyps in her mother; Diabetes in her father, maternal uncle, maternal uncle, mother, paternal aunt, and paternal grandmother; Heart disease in her mother; Hypertension in her father and  mother; Lung cancer in her maternal aunt; Mental illness in her mother; Stomach cancer in her mother.   Review of Systems See HPI. All other ROS negative Objective:   Physical Exam BP 102/70   Pulse 72   Ht 5\' 9"  (1.753 m)   Wt 187 lb (84.8 kg)   LMP 04/26/2017 (Approximate)   BMI 27.62 kg/m  Eyes anict Neck supple Lungs cta Cor NL abd soft NT neg Carnett's BS + Rectal: Patti Martinique, CMA present.   Anoderm inspection revealed NL Anal wink was + Digital exam revealed normal resting tone and voluntary squeeze. No mass or rectocele present. Simulated defecation with valsalva revealed appropriate abdominal contraction and descent.   Non tender no fissure  Skin no rash Mood - anxious Alert and oriented x 3

## 2018-05-05 MED ORDER — TRAMADOL-ACETAMINOPHEN 37.5-325 MG PO TABS: 1.0000 | ORAL_TABLET | Freq: Four times a day (QID) | ORAL | 1 refills | Status: DC | PRN

## 2018-05-05 NOTE — Progress Notes (Signed)
Other than slightly elevated glucose the labs are ok My Chart note

## 2018-05-05 NOTE — Telephone Encounter (Signed)
Called back and spoke with Pt, advised her the message I left her indicated the Ultracet was 50 mg. It is in fact 37.5/325mg . Pt verbalized her understanding.

## 2018-05-05 NOTE — Addendum Note (Signed)
Addended by: Clois Comber on: 05/05/2018 01:25 PM   Modules accepted: Orders

## 2018-05-05 NOTE — Telephone Encounter (Signed)
Called Pt, LMOVM advising of Ultracet recommendation, and that the Rx is being sent.

## 2018-05-05 NOTE — Telephone Encounter (Signed)
She doesn't want to try another triptan, so we can try Ultracet, 50mg  every 6 hours as needed for acute headache, limit to no more than 2 days out of week to prevent rebound headache.

## 2018-05-07 ENCOUNTER — Encounter: Payer: Self-pay | Admitting: Internal Medicine

## 2018-05-09 ENCOUNTER — Telehealth: Payer: Self-pay | Admitting: Internal Medicine

## 2018-05-09 ENCOUNTER — Telehealth: Payer: Self-pay | Admitting: Neurology

## 2018-05-09 NOTE — Telephone Encounter (Signed)
Patient called wanting to speak with Brandy [name] about recent MRI results. She had some questions. Please call her back at [phone]. Thanks!

## 2018-05-09 NOTE — Telephone Encounter (Signed)
Pt cancelled Egd scheduled for tomorrow 05/10/18. She said that she would like to try protonix first before proceeding with egd. She stated that she has discussed this with Dr. Carlean Purl.

## 2018-05-10 NOTE — Telephone Encounter (Signed)
Called LMOVM for Pt to return my call 

## 2018-05-10 NOTE — Telephone Encounter (Signed)
Pt LM on my VM. She wanted to make sure Dr Tomi Likens saw the MRI she had at [facility] 05/06/18. She has questions after he has the opportunity to view the images.

## 2018-05-11 ENCOUNTER — Encounter: Payer: Self-pay | Admitting: Family Medicine

## 2018-05-11 ENCOUNTER — Ambulatory Visit (INDEPENDENT_AMBULATORY_CARE_PROVIDER_SITE_OTHER): Payer: 59 | Admitting: Family Medicine

## 2018-05-11 VITALS — BP 128/70 | HR 94 | Temp 98.2°F | Wt 188.6 lb

## 2018-05-11 DIAGNOSIS — G44009 Cluster headache syndrome, unspecified, not intractable: Secondary | ICD-10-CM | POA: Diagnosis not present

## 2018-05-11 MED ORDER — PANTOPRAZOLE SODIUM 40 MG PO TBEC
40.0000 mg | DELAYED_RELEASE_TABLET | Freq: Every day | ORAL | 0 refills | Status: DC
Start: 2018-05-11 — End: 2018-06-14

## 2018-05-11 MED ORDER — PREDNISONE 20 MG PO TABS: ORAL_TABLET | ORAL | 1 refills | Status: DC

## 2018-05-11 NOTE — Telephone Encounter (Signed)
Called and spoke with Pt, advised her of Dr Georgie Chard assessment.

## 2018-05-11 NOTE — Telephone Encounter (Signed)
I don't have access to the actual imaging but as per the report, it appears that she in fact doesn't have a pituitary adenoma.  On MRI of the brain, her pituitary looked enlarged.  However, that MRI didn't specifically focus on the pituitary.  The MRI she just had focused on the pituitary to get a better look.  While the pituitary looks a little large, it is still within normal limits and not abnormal.

## 2018-05-11 NOTE — Telephone Encounter (Signed)
I left a detailed message for the patient I asked that she call back with any additional questions or concerns.

## 2018-05-11 NOTE — Telephone Encounter (Signed)
Let her know I rxed pantoprazole 40 mg po qd # 90 no refill  She should call in 1-2 weeks and make a Nov appt

## 2018-05-11 NOTE — Patient Instructions (Addendum)
Prednisone 20 mg............ 2 tabs 3 days or until the headache stops then taper as outlined

## 2018-05-11 NOTE — Progress Notes (Signed)
Brandy [name] is a 33 year old married female nonsmoker G1 P1......... 38-month-old daughter... Who comes in today for evaluation of headaches  She states she was well until last Easter when she began having sharp pains in the left side of her eyes and temple. This sharp pains now, and go the occur once or twice a 3 times a week but only lasts for a few seconds. However the dull pain is constant. She points to the left side of her head as a source of her pain. She's had numerous evaluations including neurology 2, neurosurgery, endocrinology, GI, and Dr. Martinique primary care. No diagnosis has been found there will help her stop the headaches. The neurologist thought it may be migraine they gave her different migraine medications including the new injectable medication but the medications did neither help or she had side effects from the medication.  She describes the headaches as a dull ache constant left forehead and left eye. When she goes to sleep at night the headaches go away with tender increase her start when she gets up in the morning.  She's never had headaches like this in the past.  Family history negative  I reviewed all her history including all the consults and the various specialists that she seen  BP 128/70 (BP Location: Right Arm, Patient Position: Sitting, Cuff Size: Normal)   Pulse 94   Temp 98.2 F (36.8 C) (Oral)   Wt 188 lb 9 oz (85.5 kg)   LMP 05/13/2017   SpO2 96%   BMI 27.85 kg/m  Well-developed well-nourished female no acute distress vital signs stable she's afebrile HEENT were negative Neurologic exam she is oriented 3 cranial nerves II through XII are intact and symmetrical. Sensation muscle strength reflexes gait all within normal limits  #1 chronic daily headaches...........Marland Kitchen Most likely migraine variant........ Question cluster....... Prednisone burst and taper.......Marland Kitchen If this does not resolve her headaches that I would suggest consider consult with one of the  neurologist at [facility] or [place].

## 2018-05-12 ENCOUNTER — Encounter: Payer: 59 | Admitting: Gastroenterology

## 2018-05-12 ENCOUNTER — Telehealth: Payer: Self-pay | Admitting: Family Medicine

## 2018-05-12 NOTE — Telephone Encounter (Signed)
Copied from [place] [id number]. Topic: Quick Communication - See Telephone Encounter >> May 12, 2018  2:04 PM Sheran Luz wrote: CRM for notification. See Telephone encounter for: 05/12/18.  Pt called stating that she feels predniSONE (DELTASONE) 20 MG tablet prescribed by Dr Sherren Mocha is making her feel "jittery" and anxious. Pt would like to know if something else could be prescribed in it's place. Pt is requesting a call back today if possible.  Please advise.

## 2018-05-13 NOTE — Telephone Encounter (Signed)
Message sent to Dr. Jordan for review and approval. 

## 2018-05-13 NOTE — Telephone Encounter (Signed)
Symptoms she is describing are common side effects of prednisone. I do not feel comfortable suggesting a different medication at this time. If this was prescribed because persistent headache, she can contact her neurologist.  Thanks, BJ

## 2018-05-16 NOTE — Telephone Encounter (Signed)
It is okay to place referral to [facility] as requested by patient. Thanks, BJ

## 2018-05-16 NOTE — Telephone Encounter (Signed)
Patient called and states she is returning a missed call . Please call [phone]

## 2018-05-16 NOTE — Telephone Encounter (Signed)
Spoke with patient and she stated that she was seeing [facility] and would like to be referred to one in [place]. Please advise.

## 2018-05-16 NOTE — Telephone Encounter (Signed)
Left detailed message for patient concerning medication.

## 2018-05-17 ENCOUNTER — Telehealth: Payer: Self-pay | Admitting: Internal Medicine

## 2018-05-17 NOTE — Telephone Encounter (Signed)
Patient states medication is not seeming to work and wants to know if she can reschedule her egd. Dr.Gessner's next open is 10.3.19 @2 :30pm. Patient wanting to know if other MD can do it sooner, as Dr.Gessner had it scheduled with Dr.Mansouraty before.

## 2018-05-17 NOTE — Telephone Encounter (Signed)
Okay to put her back on for EGD?  She would like earlier.  Okay for Dr. Rush Landmark?

## 2018-05-17 NOTE — Telephone Encounter (Signed)
OK to both.

## 2018-05-18 ENCOUNTER — Other Ambulatory Visit: Payer: Self-pay | Admitting: *Deleted

## 2018-05-18 DIAGNOSIS — R519 Headache, unspecified: Secondary | ICD-10-CM

## 2018-05-18 DIAGNOSIS — G44009 Cluster headache syndrome, unspecified, not intractable: Secondary | ICD-10-CM

## 2018-05-18 DIAGNOSIS — R51 Headache: Secondary | ICD-10-CM

## 2018-05-18 NOTE — Telephone Encounter (Signed)
Referral placed as requested.

## 2018-05-23 ENCOUNTER — Telehealth: Payer: Self-pay | Admitting: Neurology

## 2018-05-23 DIAGNOSIS — G4459 Other complicated headache syndrome: Secondary | ICD-10-CM

## 2018-05-23 NOTE — Telephone Encounter (Signed)
Have the headaches improved on Aimovig?  If the headaches continue to be daily, then I would check a lumbar puncture to measure opening and closing CSF pressures, analyzing the CSF for cell count, protein, glucose, gram stain and culture.

## 2018-05-23 NOTE — Telephone Encounter (Signed)
Patient left message with after hours service regarding a CSF Leak and that she is needing Dr. Tomi Likens to refer her to someone that can fix it? Please Call. Thanks

## 2018-05-23 NOTE — Telephone Encounter (Signed)
LMOVM for Pt to return my call

## 2018-05-24 NOTE — Telephone Encounter (Signed)
Spoke with Pt, headaches have not gotten better, she is agreeable to LP. Called Roberta at [facility] to advise her orders are in [place].

## 2018-05-24 NOTE — Addendum Note (Signed)
Addended by: Clois Comber on: 05/24/2018 11:27 AM   Modules accepted: Orders

## 2018-05-27 ENCOUNTER — Other Ambulatory Visit: Payer: Self-pay | Admitting: Family Medicine

## 2018-05-27 NOTE — Telephone Encounter (Signed)
Patient scheduled for EGD 10.2.19 with Dr.Mansouraty.

## 2018-05-30 ENCOUNTER — Telehealth: Payer: Self-pay

## 2018-05-30 NOTE — Telephone Encounter (Signed)
Pt called and had spoken with Wyzetta at [facility]. Pt was told if we called and told them it was STAT, the Pt could be seen today by Dr Veleta Miners.  I spoke with Dr Tomi Likens, He advise OK to send referral. Called and spoke [facility] [phone]. She is in the radiology department, was going to schedule MRI, Pt would not be seeing Dr Veleta Miners today. I advised her there had been a referral placed to [facility] by [facility] Stevie Kern to [facility] on 9/18, she was unable to access that info, she is radiology only. Called Pt and advised her of conversation with Wyzetta. Pt upset that she could not be seen. I offered her to see Dr Tomi Likens tomorrow, she made appt.

## 2018-05-31 ENCOUNTER — Ambulatory Visit (INDEPENDENT_AMBULATORY_CARE_PROVIDER_SITE_OTHER): Payer: 59 | Admitting: Cardiology

## 2018-05-31 ENCOUNTER — Ambulatory Visit: Payer: 59 | Admitting: Neurology

## 2018-05-31 ENCOUNTER — Encounter: Payer: Self-pay | Admitting: Cardiology

## 2018-05-31 VITALS — BP 98/60 | HR 80 | Ht 69.0 in | Wt 187.4 lb

## 2018-05-31 DIAGNOSIS — R002 Palpitations: Secondary | ICD-10-CM | POA: Diagnosis not present

## 2018-05-31 DIAGNOSIS — R0609 Other forms of dyspnea: Secondary | ICD-10-CM

## 2018-05-31 DIAGNOSIS — R072 Precordial pain: Secondary | ICD-10-CM | POA: Diagnosis not present

## 2018-05-31 DIAGNOSIS — Z7189 Other specified counseling: Secondary | ICD-10-CM

## 2018-05-31 NOTE — Patient Instructions (Signed)
Medication Instructions: Your physician recommends that you continue on your current medications as directed.    If you need a refill on your cardiac medications before your next appointment, please call your pharmacy.   Labwork: Your physician recommends that you return for lab work today (lipid, A1C)   Procedures/Testing: Your physician has recommended that you wear a 48 hour holter monitor. Holter monitors are medical devices that record the heart's electrical activity. Doctors most often use these monitors to diagnose arrhythmias. Arrhythmias are problems with the speed or rhythm of the heartbeat. The monitor is a small, portable device. You can wear one while you do your normal daily activities. This is usually used to diagnose what is causing palpitations/syncope (passing out). [address]   Follow-Up: Your physician wants you to follow-up in 6 weeks with Dr. Harrell Gave at [facility].  Special Instructions:    Thank you for choosing [facility] at [facility]!!

## 2018-05-31 NOTE — Progress Notes (Deleted)
NEUROLOGY FOLLOW UP OFFICE NOTE  Brandy [name] [id number]  HISTORY OF PRESENT ILLNESS: Brandy [name] is a 32 year old right-handed female with depression, anxiety, pituitary adenoma and history of kidney stones who follows up for headache.    UPDATE: She had a repeat MRI of brain and pituitary w/wo at [facility] on 05/06/18, which demonstrated that the pituitary gland "measures at the upper limits of normal for this patient's age and gender which is favored to represent physiologic hypertrophy.  There is no MR evidence of pituitary microadenoma."  Cambia ineffective and was advised by GI to refrain from NSAIDs while she exhibits GI symptoms (abdominal pain, vomiting, IBS).  She did not want to try another triptan.  She was started on Ultracet. Intensity:  *** Duration:  *** Frequency:  *** Frequency of abortive medication: *** Current NSAIDS:  no Current analgesics:  Ultracet 50mg  Current triptans:  no Current ergotamine:  no Current anti-emetic:  Zofran ODT 8mg  Current muscle relaxants:  no Current anti-anxiolytic:  Ativan 1mg  QID PRN Current sleep aide:  No  Current Antihypertensive medications:  no Current Antidepressant medications:  Fluoxetine 20mg  Current Anticonvulsant medications:  no Current anti-CGRP:  Aimovig 70mg  Current Vitamins/Herbal/Supplements:  no Current Antihistamines/Decongestants:  no Other therapy:  no Other medication:  no  Caffeine:  no Alcohol:  no Smoker:  no Diet:  hydrates Exercise:  Not at this time Depression:  Some postpartum depression; Anxiety:  yes Other pain:  no Sleep hygiene:  poor  HISTORY: Onset:  April 2019, during first trimester of first pregnancy.  She had her child 02/02/18.  They have been worse since the birth.  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  Eye exam was normal.  Labs/hormones were unremarkable.  Repeat imaging since birth of her child has shown that it has decreased in size.  She has had  several ED visits over the past month.  MRA of head and neck from 01/05/18 was negative.  MRI of brain with and without contrast from 03/07/18 demonstrated known pituitary adenoma but otherwise no acute process.  MRV of head performed the same day was negative for dural sinus thrombosis.  Location:  Left sided, left retro-orbital/temporal/occipital, but now bilateral Quality:  Pressure, "weird sensations throughout head" Intensity:  Severe.  She denies thunderclap headache. Aura:  no Prodrome:  no Postdrome:  no Associated symptoms:  Nausea, vomiting, bilateral eye lacrimation.  She denies associated, photophobia, phonophobia, unilateral numbness or weakness. Duration:  Often wakes up with it.  Off and on throughout the day. Frequency:  daily Frequency of abortive medication: none Following her pregnancy, she found that exertion and standing or sitting upright significantly aggravated the headaches and laying supine greatly relieved the headaches. Activity:  aggravates  Past NSAIDS:  Ibuprofen, naproxen, Cambia (all ineffective) Past analgesics:  Fioricet (ineffective), Excedrin Migraine, Tylenol Past abortive triptans:  Sumatriptan tablet (made her feel awful, palpitations) Past abortive ergotamine:  no Past muscle relaxants:   no Past anti-emetic:  no Past antihypertensive medications:  no Past antidepressant medications:  no Past anticonvulsant medications:  no Past anti-CGRP:  no Past vitamins/Herbal/Supplements:  no Past antihistamines/decongestants:  no Other past therapies:  no  No prior history of headache.  She is not breastfeeding Family history of headache:  no  PAST MEDICAL HISTORY: Past Medical History:  Diagnosis Date  . Anxiety   . Depression   . Gallstones   . History of nephrolithiasis   . Kidney stone   .  Migraine   . Pituitary tumor   . Vitamin D deficiency     MEDICATIONS: Current Outpatient Medications on File Prior to Visit  Medication Sig Dispense  Refill  . acetaminophen (TYLENOL) 500 MG tablet Take 1,000 mg by mouth every 6 (six) hours as needed for headache.    . Cholecalciferol (VITAMIN D) 2000 units CAPS Take 2,000 Units by mouth daily.    Marland Kitchen FLUoxetine (PROZAC) 40 MG capsule Take 40 mg by mouth daily.    Marland Kitchen LORazepam (ATIVAN) 1 MG tablet Take 1 mg by mouth every 6 (six) hours as needed for anxiety.    . ondansetron (ZOFRAN ODT) 8 MG disintegrating tablet Take 1 tablet (8 mg total) by mouth every 8 (eight) hours as needed for nausea or vomiting. 60 tablet 0  . pantoprazole (PROTONIX) 40 MG tablet Take 1 tablet (40 mg total) by mouth daily before breakfast. 90 tablet 0  . predniSONE (DELTASONE) 10 MG tablet TAKE 4 TABS X 3 DAYS, 2 TAB X 3 DAYS, 1 TAB X 3 DAYS, 1 TAB M,W,F X 2 WEEKS 80 tablet 0  . traMADol-acetaminophen (ULTRACET) 37.5-325 MG tablet Take 1 tablet by mouth every 6 (six) hours as needed. (Patient not taking: Reported on 05/11/2018) 30 tablet 1   No current facility-administered medications on file prior to visit.     ALLERGIES: Allergies  Allergen Reactions  . Metoclopramide Palpitations    Rapid heart rate  . Penicillins Hives, Other (See Comments) and Rash    Childhood reaction Has patient had a PCN reaction causing immediate rash, facial/tongue/throat swelling, SOB or lightheadedness with hypotension: No Has patient had a PCN reaction causing severe rash involving mucus membranes or skin necrosis: No Has patient had a PCN reaction that required hospitalization No Has patient had a PCN reaction occurring within the last 10 years: No If all of the above answers are "NO", then may proceed with Cephalosporin use.  Childhood reaction Has patient had a PCN reaction causing immediate rash, facial/tongue/throat swelling, SOB or lightheadedness with hypotension: No Has patient had a PCN reaction causing severe rash involving mucus membranes or skin necrosis: No Has patient had a PCN reaction that required hospitalization  No Has patient had a PCN reaction occurring within the last 10 years: No If all of the above answers are "NO", then may proceed with Cephalosporin use.  [place] [Sertraline Hcl] Anxiety    FAMILY HISTORY: Family History  Problem Relation Age of Onset  . Diabetes Mother   . Mental illness Mother   . Asthma Mother   . Stomach cancer Mother   . Colon polyps Mother   . Heart disease Mother   . Hypertension Mother   . Arthritis Mother   . Diabetes Father   . Hypertension Father   . Anxiety disorder Sister   . Colon cancer Maternal Grandmother   . Diabetes Paternal Grandmother   . Breast cancer Maternal Aunt   . Lung cancer Maternal Aunt   . Diabetes Paternal Aunt   . Diabetes Maternal Uncle   . Diabetes Maternal Uncle    ***.  SOCIAL HISTORY: Social History   Socioeconomic History  . Marital status: Married    Spouse name: Jonathon  . Number of children: 1  . Years of education: Not on file  . Highest education level: Bachelor's degree (e.g., BA, AB, BS)  Occupational History  . 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 Financial  resource strain: Not on file  . Food insecurity:    Worry: Not on file    Inability: Not on file  . Transportation needs:    Medical: Not on file    Non-medical: Not on file  Tobacco Use  . Smoking status: Never Smoker  . Smokeless tobacco: Never Used  Substance and Sexual Activity  . Alcohol use: Not Currently    Frequency: Never  . Drug use: No  . Sexual activity: Yes  Lifestyle  . Physical activity:    Days per week: Not on file    Minutes per session: Not on file  . Stress: Not on file  Relationships  . Social connections:    Talks on phone: Not on file    Gets together: Not on file    Attends religious service: Not on file    Active member of club or organization: Not on file    Attends meetings of clubs or organizations: Not on file    Relationship status: Not on file    . Intimate partner violence:    Fear of current or ex partner: Not on file    Emotionally abused: Not on file    Physically abused: Not on file    Forced sexual activity: Not on file  Other Topics Concern  . Not on file  Social History Narrative   Patient is right-handed. She lives with her husband and child in a split-level home.    Daughter born 01/2018   She avoids caffeine.    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    REVIEW OF SYSTEMS: Constitutional: No fevers, chills, or sweats, no generalized fatigue, change in appetite Eyes: No visual changes, double vision, eye pain Ear, nose and throat: No hearing loss, ear pain, nasal congestion, sore throat Cardiovascular: No chest pain, palpitations Respiratory:  No shortness of breath at rest or with exertion, wheezes GastrointestinaI: No nausea, vomiting, diarrhea, abdominal pain, fecal incontinence Genitourinary:  No dysuria, urinary retention or frequency Musculoskeletal:  No neck pain, back pain Integumentary: No rash, pruritus, skin lesions Neurological: as above Psychiatric: No depression, insomnia, anxiety Endocrine: No palpitations, fatigue, diaphoresis, mood swings, change in appetite, change in weight, increased thirst Hematologic/Lymphatic:  No purpura, petechiae. Allergic/Immunologic: no itchy/runny eyes, nasal congestion, recent allergic reactions, rashes  PHYSICAL EXAM: *** General: No acute distress.  Patient appears ***-groomed.  *** body habitus. Head:  Normocephalic/atraumatic Eyes:  Fundi examined but not visualized Neck: supple, no paraspinal tenderness, full range of motion Heart:  Regular rate and rhythm Lungs:  Clear to auscultation bilaterally Back: No paraspinal tenderness Neurological Exam: alert and oriented to person, place, and time. Attention span and concentration intact, recent and remote memory intact, fund of knowledge intact.  Speech fluent and not dysarthric, language intact.   CN II-XII intact. Bulk and tone normal, muscle strength 5/5 throughout.  Sensation to light touch, temperature and vibration intact.  Deep tendon reflexes 2+ throughout, toes downgoing.  Finger to nose and heel to shin testing intact.  Gait normal, Romberg negative.  IMPRESSION: 1.  Intractable persistent headache.  Likely new-onset migraines/status migrainosus, however consider post-LP headache with continued CSF leak, as headaches are more severe since giving birth (in which she received an epidural), and are positional.    Imaging has ruled out mass lesion, cerebral aneurysm, dural sinus thrombosis or other acute intracranial/vascular etiology. 2.  Pituitary hypertrophy, still within normal limits for gender and age.  Recent MRI with pituitary protocol  did not demonstrate evidence of a microadenoma.  PLAN: 1.  ***  [name], DO  CC: Betty Martinique, MD

## 2018-05-31 NOTE — Progress Notes (Signed)
Cardiology Office Note:    Date:  05/31/2018   ID:  Brandy [name], DOB 1986-02-03, MRN [id number]  PCP:  Martinique, Betty G, MD  Cardiologist:  Brandy Dresser, MD PhD  Referring MD: Martinique, Betty G, MD   Chief Complaint  Patient presents with  . Shortness of Breath  . Chest Pain    tightens pain between shoulders    History of Present Illness:    Brandy [name] is a 32 y.o. female with a hx of anxiety/postpartum depression who is seen as a new consult at the request of Martinique, Betty G, MD for the evaluation and management of palpitations, shortness of breath.  Patient concerns: Had a baby 4 mos ago. Has been having palpitations and shortness of breath since then. Has noted that her heart rate has jumped up 30-40 beats when exerting herself or moving around. When she exerts herself to a minimal amount, she feels like she might pass out, gets sweaty. Had noted high blood pressure, now back to her normal. Occurs even with walking a short distance. 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 Since she has had the baby, has been struggling. Lots of headaches. Minimal activity, though she is back to work. Heart rate goes up, feels short of breath, diaphoretic. Sharp back pain with movement, dull chest pain with movement or deep breathing.  She has been seen in the ER for this, as well as by Dr. Nehemiah [name] at [facility]. Workup thus far has been unrevealing. Normal echo in Care Everywhere. Normal treadmill stress test. No significant arrhythmia seen on holter monitor. ECG from 8.21.19 was sinus tach at 147 bpm. TSH is normal.  Had full syncope about 2 mos ago, had very stressful event (mom thought she was having heart attack) and had just started a new antidepressant. No syncope more recently. No PND, orthopnea, LE edema. Has been losing weight since childbirth.  Family history: Mom with CAD (50% left main), aortic atherosclerosis,  heart failure. Has had 40+ surgeries, 150 kidney stones, multiple back issues, GI bleeding, diabetes, HF. Uncle had MI age 69, had arrhythmia. Gparents with heart failure. Father is diabete, type II, but very thin, frequent emesis.  Social history: new mom, married, works as Consulting civil engineer at [employer]. Never smoker. No current alcohol.   Past Medical History:  Diagnosis Date  . Anxiety   . Depression   . Gallstones   . History of nephrolithiasis   . Kidney stone   . Migraine   . Pituitary tumor   . Vitamin D deficiency     Past Surgical History:  Procedure Laterality Date  . CHOLECYSTECTOMY  2014  . [address], 2004  . MOUTH SURGERY    . TYMPANOSTOMY TUBE PLACEMENT      Current Medications: Current Outpatient Medications on File Prior to Visit  Medication Sig  . acetaminophen (TYLENOL) 500 MG tablet Take 1,000 mg by mouth every 6 (six) hours as needed for headache.  . Cholecalciferol (VITAMIN D) 2000 units CAPS Take 2,000 Units by mouth daily.  Marland Kitchen FLUoxetine (PROZAC) 40 MG capsule Take 40 mg by mouth daily.  Marland Kitchen LORazepam (ATIVAN) 1 MG tablet Take 1 mg by mouth every 6 (six) hours as needed for anxiety.  . pantoprazole (PROTONIX) 40 MG tablet Take 1 tablet (40 mg total) by mouth daily before breakfast.  . ondansetron (ZOFRAN ODT) 8 MG disintegrating tablet Take 1 tablet (8 mg total)  by mouth every 8 (eight) hours as needed for nausea or vomiting. (Patient not taking: Reported on 05/31/2018)  . predniSONE (DELTASONE) 10 MG tablet TAKE 4 TABS X 3 DAYS, 2 TAB X 3 DAYS, 1 TAB X 3 DAYS, 1 TAB M,W,F X 2 WEEKS (Patient not taking: Reported on 05/31/2018)  . traMADol-acetaminophen (ULTRACET) 37.5-325 MG tablet Take 1 tablet by mouth every 6 (six) hours as needed. (Patient not taking: Reported on 05/11/2018)   No current facility-administered medications on file prior to visit.      Allergies:   Metoclopramide; Penicillins; and Zoloft [sertraline hcl]   Social History    Socioeconomic History  . Marital status: Married    Spouse name: Brandy [name]  . Number of children: 1  . Years of education: Not on file  . Highest education level: Bachelor's degree (e.[name]., BA, AB, BS)  Occupational History  . 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 Financial resource strain: Not on file  . Food insecurity:    Worry: Not on file    Inability: Not on file  . Transportation needs:    Medical: Not on file    Non-medical: Not on file  Tobacco Use  . Smoking status: Never Smoker  . Smokeless tobacco: Never Used  Substance and Sexual Activity  . Alcohol use: Not Currently    Frequency: Never  . Drug use: No  . Sexual activity: Yes  Lifestyle  . Physical activity:    Days per week: Not on file    Minutes per session: Not on file  . Stress: Not on file  Relationships  . Social connections:    Talks on phone: Not on file    Gets together: Not on file    Attends religious service: Not on file    Active member of club or organization: Not on file    Attends meetings of clubs or organizations: Not on file    Relationship status: Not on file  Other Topics Concern  . Not on file  Social History Narrative   Patient is right-handed. She lives with her husband and child in a split-level home.    Daughter born 01/2018   She avoids caffeine.    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     Family History: The patient's family history includes Anxiety disorder in her sister; Arthritis in her mother; Asthma in her mother; Breast cancer in her maternal aunt; Colon cancer in her maternal grandmother; Colon polyps in her mother; Diabetes in her father, maternal uncle, maternal uncle, mother, paternal aunt, and paternal grandmother; Heart disease in her mother; Hypertension in her father and mother; Lung cancer in her maternal aunt; Mental illness in her mother; Stomach cancer in her mother. Mom with CAD (50%  left main), aortic atherosclerosis, heart failure. Has had 40+ surgeries, 150 kidney stones, multiple back issues, GI bleeding, diabetes, HF. Uncle had MI age 79, had arrhythmia. Gparents with heart failure. Father is diabete, type II, but very thin, frequent emesis.  ROS:   Please see the history of present illness.  Additional pertinent ROS:  Constitutional: Negative for chills, fever, night sweats HENT: Negative for ear pain and hearing loss.   Eyes: Negative for loss of vision and eye pain.  Respiratory: Negative for cough, sputum, shortness of breath, wheezing.   Cardiovascular: Positive for chest tightness, palpitations. Negative for PND, orthopnea, lower extremity edema and claudication.  Gastrointestinal:  Negative for abdominal pain, melena, and hematochezia.  Genitourinary: Negative for dysuria and hematuria.  Musculoskeletal: Negative for falls and myalgias.  Skin: Negative for itching and rash.  Neurological: Negative for focal weakness, focal sensory changes and loss of consciousness. Positive for chronic headaches. Endo/Heme/Allergies: Does not bruise/bleed easily.   EKGs/Labs/Other Studies Reviewed:    The following studies were reviewed today: Echo, Holter, treadmill stress reports reviewed in Care Everywhere. Prior notes reviewed.  EKG:  EKG is ordered today.  The ekg ordered today demonstrates normal sinus rhythm with sinus arrhythmia.  Recent Labs: 03/25/2018: B Natriuretic Peptide 15.0; TSH 0.604 04/20/2018: ALT 39; Hemoglobin 12.9; Platelets 289 05/04/2018: BUN 13; Creatinine, Ser 1.10; Potassium 4.1; Sodium 142  Recent Lipid Panel    Component Value Date/Time   CHOL 141 09/24/2014 0837   TRIG 48.0 09/24/2014 0837   HDL 49.00 09/24/2014 0837   CHOLHDL 3 09/24/2014 0837   VLDL 9.6 09/24/2014 0837   LDLCALC 82 09/24/2014 0837    Physical Exam:    VS:  BP 98/60   Pulse 80   Ht 5\' 9"  (1.753 m)   Wt 187 lb 6.4 oz (85 kg)   BMI 27.67 kg/m     Wt Readings from  Last 3 Encounters:  05/31/18 187 lb 6.4 oz (85 kg)  05/11/18 188 lb 9 oz (85.5 kg)  05/04/18 187 lb (84.8 kg)     GEN: Well nourished, well developed in no acute distress HEENT: Normal NECK: No JVD; No carotid bruits LYMPHATICS: No lymphadenopathy CARDIAC: regular rhythm, normal S1 and S2, no murmurs, rubs, gallops. Radial and DP pulses 2+ bilaterally. RESPIRATORY:  Clear to auscultation without rales, wheezing or rhonchi  ABDOMEN: Soft, non-tender, non-distended MUSCULOSKELETAL:  No edema; No deformity  SKIN: Warm and dry NEUROLOGIC:  Alert and oriented x 3 PSYCHIATRIC:  Normal affect   ASSESSMENT:    1. Heart palpitations   2. Dyspnea on exertion   3. Counseling on health promotion and disease prevention   4. Precordial pain    PLAN:    1. Heart palpitations, dyspnea on exertion, chest pain: has already had normal echo and normal treadmill stress through Dr. Alveria Apley office at [facility]. Had unrevealing holter monitor. Given that her symptoms have persisted, will recheck a 48 hr holter to make sure she is not having abnormal rhythms during her events. We did discuss that if this is sinus tachycardia, it is usually driven by other factors. Could be deconditioning, pain, drug side effects, etc. No signs/symptoms of fever/infection. Normal echo suggests this is not a peripartum cardiomyopathy.  -48 hour holter. If only rare ectopy/sinus tachycardia noted, would encourage gradual conditioning. Recommend staying hydrated, resting as able. She is having her anxiety and depression medications adjusted. Prozac does tend to be stimulating, but she has had adverse reactions to other medications.  -would avoid presumptive treatment with beta blocker if this is sinus tachycardia  2. Prevention: given her family history, she should be screened for cardiovascular risk factors. The ASCVD Risk score Mikey Bussing DC Jr., et al., 2013) failed to calculate for the following reasons:   The 2013 ASCVD  risk score is only valid for ages 70 to 30  -recommend screening lipids, A1c given her family history of heart disease and diabetes -counseled on lifestyle recommendations today.  Plan for follow up: 6 weeks to monitor symptoms.  Medication Adjustments/Labs and Tests Ordered: Current medicines are reviewed at length with the patient today.  Concerns regarding medicines are outlined above.  Orders Placed This Encounter  Procedures  . Hemoglobin A1c  . Lipid panel  . HOLTER MONITOR - 48 HOUR  . EKG 12-Lead   No orders of the defined types were placed in this encounter.   Patient Instructions  Medication Instructions: Your physician recommends that you continue on your current medications as directed.    If you need a refill on your cardiac medications before your next appointment, please call your pharmacy.   Labwork: Your physician recommends that you return for lab work today (lipid, A1C)   Procedures/Testing: Your physician has recommended that you wear a 48 hour holter monitor. Holter monitors are medical devices that record the heart's electrical activity. Doctors most often use these monitors to diagnose arrhythmias. Arrhythmias are problems with the speed or rhythm of the heartbeat. The monitor is a small, portable device. You can wear one while you do your normal daily activities. This is usually used to diagnose what is causing palpitations/syncope (passing out). [address]   Follow-Up: Your physician wants you to follow-up in 6 weeks with Dr. Harrell Gave at [facility].  Special Instructions:    Thank you for choosing [facility]!!       Signed, Brandy Dresser, MD PhD 05/31/2018 12:39 PM    [place]

## 2018-06-01 ENCOUNTER — Encounter: Payer: 59 | Admitting: Gastroenterology

## 2018-06-01 LAB — HEMOGLOBIN A1C
ESTIMATED AVERAGE GLUCOSE: 108 mg/dL
HEMOGLOBIN A1C: 5.4 % (ref 4.8–5.6)

## 2018-06-01 LAB — LIPID PANEL
CHOLESTEROL TOTAL: 196 mg/dL (ref 100–199)
Chol/HDL Ratio: 3.7 ratio (ref 0.0–4.4)
HDL: 53 mg/dL (ref >39)
LDL Calculated: 128 mg/dL — ABNORMAL HIGH (ref 0–99)
TRIGLYCERIDES: 76 mg/dL (ref 0–149)
VLDL Cholesterol Cal: 15 mg/dL (ref 5–40)

## 2018-06-02 ENCOUNTER — Ambulatory Visit: Payer: 59 | Admitting: Psychiatry

## 2018-06-06 ENCOUNTER — Telehealth: Payer: Self-pay | Admitting: Family Medicine

## 2018-06-06 NOTE — Telephone Encounter (Signed)
Copied from [place] [id number]. Topic: Referral - Status >> Jun 06, 2018  1:03 PM Vernona Rieger wrote: Reason for CRM: Patient is requesting a referral to " Dr Asencion Partridge Dohmeier " in [place]. Phone Number [phone], fax number [phone]. 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

## 2018-06-06 NOTE — Telephone Encounter (Signed)
Message sent to Dr. Jordan for review and approval. 

## 2018-06-07 ENCOUNTER — Ambulatory Visit: Payer: 59 | Admitting: Psychiatry

## 2018-06-08 NOTE — Telephone Encounter (Signed)
I thought she was already following with neurologist. We have placed neurology referral per patient request on 02/18/2018 and on 05/18/2018. That she need another referral? Can she call and arrange appointment with provider she wants to see?, given the fact that last referral was placed less than a month ago.  Thanks, BJ

## 2018-06-08 NOTE — Telephone Encounter (Signed)
Spoke with patient and informed her that her referral information was sent over to [facility] as requested. Patient verbalized understanding.

## 2018-06-10 ENCOUNTER — Inpatient Hospital Stay: Admission: RE | Admit: 2018-06-10 | Payer: 59 | Source: Ambulatory Visit

## 2018-06-13 ENCOUNTER — Ambulatory Visit: Payer: 59 | Admitting: Family Medicine

## 2018-06-13 DIAGNOSIS — Z0289 Encounter for other administrative examinations: Secondary | ICD-10-CM

## 2018-06-14 ENCOUNTER — Encounter: Payer: Self-pay | Admitting: Family Medicine

## 2018-06-14 ENCOUNTER — Ambulatory Visit: Payer: 59 | Admitting: Neurology

## 2018-06-14 ENCOUNTER — Ambulatory Visit (INDEPENDENT_AMBULATORY_CARE_PROVIDER_SITE_OTHER): Payer: 59 | Admitting: Family Medicine

## 2018-06-14 ENCOUNTER — Telehealth: Payer: Self-pay | Admitting: Neurology

## 2018-06-14 ENCOUNTER — Encounter: Payer: Self-pay | Admitting: Neurology

## 2018-06-14 VITALS — BP 124/80 | HR 86 | Temp 98.4°F | Resp 12 | Ht 69.0 in | Wt 185.4 lb

## 2018-06-14 VITALS — BP 115/75 | HR 81 | Ht 69.0 in | Wt 185.0 lb

## 2018-06-14 DIAGNOSIS — F53 Postpartum depression: Secondary | ICD-10-CM

## 2018-06-14 DIAGNOSIS — R300 Dysuria: Secondary | ICD-10-CM

## 2018-06-14 DIAGNOSIS — M791 Myalgia, unspecified site: Secondary | ICD-10-CM

## 2018-06-14 DIAGNOSIS — G4489 Other headache syndrome: Secondary | ICD-10-CM | POA: Diagnosis not present

## 2018-06-14 DIAGNOSIS — G4440 Drug-induced headache, not elsewhere classified, not intractable: Secondary | ICD-10-CM | POA: Diagnosis not present

## 2018-06-14 DIAGNOSIS — O9989 Other specified diseases and conditions complicating pregnancy, childbirth and the puerperium: Secondary | ICD-10-CM

## 2018-06-14 DIAGNOSIS — F419 Anxiety disorder, unspecified: Secondary | ICD-10-CM

## 2018-06-14 DIAGNOSIS — R42 Dizziness and giddiness: Secondary | ICD-10-CM

## 2018-06-14 DIAGNOSIS — O99345 Other mental disorders complicating the puerperium: Secondary | ICD-10-CM

## 2018-06-14 DIAGNOSIS — O99340 Other mental disorders complicating pregnancy, unspecified trimester: Secondary | ICD-10-CM

## 2018-06-14 DIAGNOSIS — R519 Headache, unspecified: Secondary | ICD-10-CM

## 2018-06-14 DIAGNOSIS — R10A1 Flank pain, right side: Secondary | ICD-10-CM

## 2018-06-14 DIAGNOSIS — R109 Unspecified abdominal pain: Secondary | ICD-10-CM | POA: Diagnosis not present

## 2018-06-14 DIAGNOSIS — R51 Headache: Secondary | ICD-10-CM

## 2018-06-14 DIAGNOSIS — N200 Calculus of kidney: Secondary | ICD-10-CM

## 2018-06-14 HISTORY — DX: Drug-induced headache, not elsewhere classified, not intractable: G44.40

## 2018-06-14 HISTORY — DX: Anxiety disorder, unspecified: F41.9

## 2018-06-14 HISTORY — DX: Postpartum depression: F53.0

## 2018-06-14 HISTORY — DX: Other headache syndrome: G44.89

## 2018-06-14 HISTORY — DX: Other mental disorders complicating pregnancy, unspecified trimester: O99.340

## 2018-06-14 LAB — C-REACTIVE PROTEIN: CRP: 0.2 mg/dL — ABNORMAL LOW (ref 0.5–20.0)

## 2018-06-14 LAB — POCT URINALYSIS DIPSTICK
Bilirubin, UA: NEGATIVE
GLUCOSE UA: NEGATIVE
LEUKOCYTES UA: NEGATIVE
Nitrite, UA: NEGATIVE
PH UA: 6.5 (ref 5.0–8.0)
Protein, UA: NEGATIVE
RBC UA: NEGATIVE
SPEC GRAV UA: 1.01 (ref 1.010–1.025)
Urobilinogen, UA: 0.2 E.U./dL

## 2018-06-14 LAB — BASIC METABOLIC PANEL
BUN: 9 mg/dL (ref 6–23)
CALCIUM: 9.6 mg/dL (ref 8.4–10.5)
CO2: 28 mEq/L (ref 19–32)
Chloride: 103 mEq/L (ref 96–112)
Creatinine, Ser: 0.8 mg/dL (ref 0.40–1.20)
GFR: 88.75 mL/min (ref >60.00)
GLUCOSE: 95 mg/dL (ref 70–99)
Potassium: 4 mEq/L (ref 3.5–5.1)
SODIUM: 139 meq/L (ref 135–145)

## 2018-06-14 LAB — TSH: TSH: 0.58 u[IU]/mL (ref 0.35–4.50)

## 2018-06-14 LAB — CK: Total CK: 18 U/L (ref 7–177)

## 2018-06-14 MED ORDER — LAMOTRIGINE 25 MG PO TABS: ORAL_TABLET | ORAL | 2 refills | Status: DC

## 2018-06-14 NOTE — Patient Instructions (Signed)
A few things to remember from today's visit:   Dysuria - Plan: POC Urinalysis Dipstick  Dizziness  Right flank pain  Headache, unspecified headache type  Myalgia - Plan: Basic metabolic panel, TSH, C-reactive protein, CK  Bilateral nephrolithiasis   Please be sure medication list is accurate. If a new problem present, please set up appointment sooner than planned today.

## 2018-06-14 NOTE — Patient Instructions (Signed)
Lamotrigine tablets What is this medicine? LAMOTRIGINE (la MOE Hendricks Limes) is used to control seizures in adults and children with epilepsy . It is also used in adults to treat depression and anxiety disorder. This medicine may be used for other purposes; ask your health care provider or pharmacist if you have questions. COMMON BRAND NAME(S): Lamictal What should I tell my health care provider before I take this medicine? They need to know if you have any of these conditions: -a history of depression or bipolar disorder -folate deficiency -kidney disease -liver disease -suicidal thoughts, plans, or attempt; a previous suicide attempt by you or a family member -an unusual or allergic reaction to lamotrigine or other seizure medications, other medicines, foods, dyes, or preservatives -pregnant or trying to get pregnant -breast-feeding How should I use this medicine? Take this medicine by mouth with a glass of water. Follow the directions on the prescription label. Do not chew these tablets. If this medicine upsets your stomach, take it with food or milk. Take your doses at regular intervals. Do not take your medicine more often than directed. A special MedGuide will be given to you by the pharmacist with each new prescription and refill. Be sure to read this information carefully each time. Talk to your pediatrician regarding the use of this medicine in children. While this drug may be prescribed for children as young as 2 years for selected conditions, precautions do apply. Overdosage: If you think you have taken too much of this medicine contact a poison control center or emergency room at once. NOTE: This medicine is only for you. Do not share this medicine with others. What if I miss a dose? If you miss a dose, take it as soon as you can. If it is almost time for your next dose, take only that dose. Do not take double or extra doses.  Give your health care provider a list of all the medicines,  herbs, non-prescription drugs, or dietary supplements you use. Also tell them if you smoke, drink alcohol, or use illegal drugs. Some items may interact with your medicine. What should I watch for while using this medicine? Visit your doctor or health care professional for regular checks on your progress. If you take this medicine for seizures, wear a Medic Alert bracelet or necklace. Carry an identification card with information about your condition, medicines, and doctor or health care professional. It is important to take this medicine exactly as directed. When first starting treatment, your dose will need to be adjusted slowly. It may take weeks or months before your dose is stable. You should contact your doctor or health care professional if your seizures get worse or if you have any new types of seizures. Do not stop taking this medicine unless instructed by your doctor or health care professional. Stopping your medicine suddenly can increase your seizures or their severity. Contact your doctor or health care professional right away if you develop a rash while taking this medicine. Rashes may be very severe and sometimes require treatment in the hospital. Deaths from rashes have occurred. Serious rashes occur more often in children than adults taking this medicine. It is more common for these serious rashes to occur during the first 2 months of treatment, but a rash can occur at any time. You may get drowsy, dizzy, or have blurred vision. Do not drive, use machinery, or do anything that needs mental alertness until you know how this medicine affects you. To reduce dizzy or fainting  spells, do not sit or stand up quickly, especially if you are an older patient. Alcohol can increase drowsiness and dizziness. Avoid alcoholic drinks. If you are taking this medicine for bipolar disorder, it is important to report any changes in your mood to your doctor or health care professional. If your condition gets  worse, you get mentally depressed, feel very hyperactive or manic, have difficulty sleeping, or have thoughts of hurting yourself or committing suicide, you need to get help from your health care professional right away. If you are a caregiver for someone taking this medicine for bipolar disorder, you should also report these behavioral changes right away. The use of this medicine may increase the chance of suicidal thoughts or actions. Pay special attention to how you are responding while on this medicine. Your mouth may get dry. Chewing sugarless gum or sucking hard candy, and drinking plenty of water may help. Contact your doctor if the problem does not go away or is severe. Women who become pregnant while using this medicine may enroll in the [place] Pregnancy Registry by calling [phone]. This registry collects information about the safety of antiepileptic drug use during pregnancy. What side effects may I notice from receiving this medicine? Side effects that you should report to your doctor or health care professional as soon as possible: -allergic reactions like skin rash, itching or hives, swelling of the face, lips, or tongue -blurred or double vision -difficulty walking or controlling muscle movements -fever -headache, stiff neck, and sensitivity to light -painful sores in the mouth, eyes, or nose -redness, blistering, peeling or loosening of the skin, including inside the mouth -severe muscle pain -swollen lymph glands -uncontrollable eye movements -unusual bruising or bleeding -unusually weak or tired -vomiting -worsening of mood, thoughts or actions of suicide or dying -yellowing of the eyes or skin Side effects that usually do not require medical attention (report to your doctor or health care professional if they continue or are bothersome): -diarrhea or constipation -difficulty sleeping -nausea -tremors This list may not describe all possible  side effects. Call your doctor for medical advice about side effects. You may report side effects to FDA at 1-800-FDA-1088. Where should I keep my medicine? Keep out of reach of children. Store at room temperature between 15 and 30 degrees C (59 and 86 degrees F). Throw away any unused medicine after the expiration date. NOTE: This sheet is a summary. It may not cover all possible information. If you have questions about this medicine, talk to your doctor, pharmacist, or health care provider.  2018 Elsevier/Gold Standard (2015-09-19 09:29:40)

## 2018-06-14 NOTE — Progress Notes (Signed)
SLEEP MEDICINE CLINIC   Provider:  Larey Seat, M.D.  Primary Care Physician:  Martinique, Betty G, MD   Referring Provider: Martinique, Betty G, MD    Chief Complaint  Patient presents with  . New Patient (Initial Visit)    pt alone, rm 10. pt states that easter sunday (32 wks preg) pain behind her eye started. MRI showed slightly enlarged pituitary gland. pressure behind eyes throughout the pregnancy, was induced, she had epidural. states was ok after having the baby. 3-4 wks later she started to have weird head pain that was different from the headaches when she was pregnancy. pressure,stabbing pain in temples sitting or standing more so, she has had several MRI, CT, MRA's   . Other    pt has seen a few different neurologist and no one has been able to find anything. she has seen opthmalogist and they told her it was fine. she was told by [facility] neurologist that she had CSF leak. Has not been proven -  She had aimovig and had side effects from that. she is here today to get a third opinion because she has not had answered.     HPI:  Brandy [name] is a 32 y.o. female patient and seen here on 06-14-2018 in a referral from Dr.Todd for a second opinion.   Mrs. Brandy [name] has a 6-1/20-month history of neurologic problems that she did not experience prior.  On Easter Sunday 2019 while pregnant and the 32nd week, she developed a stabbing sharp pain behind her eye, the left eye was affected.  An MRI was ordered and the brain MRI showed an enlarged pituitary gland.  This is not an unusual finding in a pregnant woman as she is preparing to secrete prolactin. Soon after she delivered her now 82-month-old girl she had a confirmatory MRI, at this time the pituitary gland was normal, there was no evidence of an micro or macroadenoma.  She was then referred to Dr. [name], DO.  And he suspected that her headaches are migrainous in nature.  Several migraine treatments were induced and introduced,  including the new calcitonin binding injection medications, none of these have affected the head pain.  The headaches have changed in quality they are now over the left forehead and behind the left eye but there is a pressure sensation but also reaches the right forehead and even sometimes the right eye, so it is no longer a unilateral or more lateral manifestation, it is a dull ache and not sharp, and it radiates towards the left side of her neck.  She has noticed pulsatory to her audible fluid rushing, and she states that when she stands up the headache can get worse and gets better as she lays down.  He has a fluid leak which was also suspected by a [facility] neurologist had been evaluated and ruled out. A steroid taper did not affect headaches either - not likely an inflammatory headaches. imitrex gave palpitations.  By now we only know that she does not have a fluid leak, she does not have a pituitary adenoma, she does not have migraines.  She remains frustrated and irritated understandably as she has daily headache of pressure, fluid washing, tenderness over the neck.   Chief complaint according to patient :" since I had the baby I no longer respond normal to medication" - ( tearfully). She has now anxiety, is tearful and very, very scared.   Sleep habits are as follows: she goes to bed  at 9 o'clock and her baby sleeps through 9 hours, she wakes still up at 4 AM every morning , no matter when she fell asleep. Hypervigilant.     Medical history and family history: mother had migraines- the patient is depressed and high anxiety . Used to have kidney stones, had hyperemesis gravidarum.  Vomiting , wretching caused the headaches she thinks. She reports dizziness with orthostasis.  Social history: new mother - [id number] , 47 month old baby daughter. Non smoker, non drinker, caffeine - no coffee, no soda, no tea.  Nuclear medicine technologist.   Review of Systems: Out of a complete 14 system review,  the patient complains of only the following symptoms, and all other reviewed systems are negative. Tinnitus, blurred vision, insomnia.   How likely are you to doze in the following situations: 0 = not likely, 1 = slight chance, 2 = moderate chance, 3 = high chance  Sitting and Reading? 0 Watching Television?0Sitting inactive in a public place (theater or meeting)?0 Lying down in the afternoon when circumstances permit? 0 Sitting and talking to someone? 0 Sitting quietly after lunch without alcohol?0 In a car, while stopped for a few minutes in traffic?0 As a passenger in a car for an hour without a break?0  Total = 0   Epworth Sleepiness Score -0  , Fatigue severity score high 43/ 63   , depression score is high- " I cant get out of bed a lot of days" . She is not nursing.   Social History   Socioeconomic History  . Marital status: Married    Spouse name: Jonathon  . Number of children: 1  . Years of education: Not on file  . Highest education level: Bachelor's degree (e.g., BA, AB, BS)  Occupational History  . 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 Financial resource strain: Not on file  . Food insecurity:    Worry: Not on file    Inability: Not on file  . Transportation needs:    Medical: Not on file    Non-medical: Not on file  Tobacco Use  . Smoking status: Never Smoker  . Smokeless tobacco: Never Used  Substance and Sexual Activity  . Alcohol use: Not Currently    Frequency: Never  . Drug use: No  . Sexual activity: Yes  Lifestyle  . Physical activity:    Days per week: Not on file    Minutes per session: Not on file  . Stress: Not on file  Relationships  . Social connections:    Talks on phone: Not on file    Gets together: Not on file    Attends religious service: Not on file    Active member of club or organization: Not on file    Attends meetings of clubs or organizations: Not on file     Relationship status: Not on file  . Intimate partner violence:    Fear of current or ex partner: Not on file    Emotionally abused: Not on file    Physically abused: Not on file    Forced sexual activity: Not on file  Other Topics Concern  . Not on file  Social History Narrative   Patient is right-handed. She lives with her husband and child in a split-level home.    Daughter born 01/2018   She avoids caffeine.    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[employer]  Family History  Problem Relation Age of Onset  . Diabetes Mother   . Mental illness Mother   . Asthma Mother   . Stomach cancer Mother   . Colon polyps Mother   . Heart disease Mother   . Hypertension Mother   . Arthritis Mother   . Diabetes Father   . Hypertension Father   . Anxiety disorder Sister   . Colon cancer Maternal Grandmother   . Diabetes Paternal Grandmother   . Breast cancer Maternal Aunt   . Lung cancer Maternal Aunt   . Diabetes Paternal Aunt   . Diabetes Maternal Uncle   . Diabetes Maternal Uncle     Past Medical History:  Diagnosis Date  . Anxiety   . Depression   . Gallstones   . History of nephrolithiasis   . Kidney stone   . Migraine   . Pituitary tumor   . Vitamin D deficiency     Past Surgical History:  Procedure Laterality Date  . CHOLECYSTECTOMY  2014  . [place], 2004  . MOUTH SURGERY    . TYMPANOSTOMY TUBE PLACEMENT      Current Outpatient Medications  Medication Sig Dispense Refill  . acetaminophen (TYLENOL) 500 MG tablet Take 1,000 mg by mouth every 6 (six) hours as needed for headache.    . Cholecalciferol (VITAMIN D) 2000 units CAPS Take 2,000 Units by mouth daily.    . Coenzyme Q10 (CO Q-10 PO) Take by mouth daily.    Marland Kitchen LORazepam (ATIVAN) 1 MG tablet Take 1 mg by mouth every 6 (six) hours as needed for anxiety.    Marland Kitchen MAGNESIUM PO Take by mouth daily.    . Omega-3 Fatty Acids (FISH OIL PO) Take by mouth daily.    . ondansetron (ZOFRAN ODT) 8  MG disintegrating tablet Take 1 tablet (8 mg total) by mouth every 8 (eight) hours as needed for nausea or vomiting. 60 tablet 0   No current facility-administered medications for this visit.     Allergies as of 06/14/2018 - Review Complete 06/14/2018  Allergen Reaction Noted  . Metoclopramide Palpitations 03/16/2018  . Penicillins Hives, Other (See Comments), and Rash 07/18/2014  . Erenumab-aooe Other (See Comments) 06/02/2018  . Sumatriptan Palpitations 06/02/2018  . Zoloft [sertraline hcl] Anxiety 04/08/2018    Vitals: BP 115/75   Pulse 81   Ht 5\' 9"  (1.753 m)   Wt 185 lb (83.9 kg)   LMP 05/31/2018 Comment: lasted for 2 weeks  BMI 27.32 kg/m  Last Weight:  Wt Readings from Last 1 Encounters:  06/14/18 185 lb (83.9 kg)   QMV:HQIO mass index is 27.32 kg/m.     Last Height:   Ht Readings from Last 1 Encounters:  06/14/18 5\' 9"  (1.753 m)    Physical exam:  General: The patient is awake, alert and appears not in acute distress. The patient is well groomed. Head: Normocephalic, atraumatic. Neck is supple. Mallampati 2  neck circumference:15  Nasal airflow patent , Cardiovascular:  Regular rate and rhythm , without  murmurs or carotid bruit, and without distended neck veins. Respiratory: Lungs are clear to auscultation. Skin:  Without evidence of edema, or rash Trunk: BMI is 27. 32. The patient's posture is stooped.   Neurologic exam : The patient is awake and alert, oriented to place and time.   Memory subjective  described as intact. Attention span & concentration ability appears normal.  Speech is fluent,  Without dysarthria, dysphonia or aphasia.  Mood and  affect are tearful , anxious  Depressed.   Cranial nerves: Pupils are equal and briskly reactive to light. Funduscopic exam without   evidence of pallor or edema. Extraocular movements  in vertical and horizontal planes intact and without nystagmus. Visual fields by finger perimetry are intact. Hearing to finger rub  intact.   Facial sensation intact to fine touch.  Facial motor strength is symmetric and tongue and uvula move midline. Shoulder shrug was symmetrical.   Motor exam:   Normal tone, muscle bulk and symmetric strength in all extremities. She provided such a weak grip when greeting me.  Sensory:  Fine touch, pinprick and vibration were tested and normal; . Proprioception tested in the upper extremities was normal. Coordination: Rapid alternating movements in the fingers/hands was normal.  Finger-to-nose maneuver  normal without evidence of ataxia, dysmetria or tremor. Gait and station: Patient walks without assistive device -Tandem gait is unfragmented. Turns with  3  Steps. Romberg negative. Deep tendon reflexes: in the  upper and lower extremities are symmetric and intact. Babinski maneuver response is downgoing.    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  Will stop the Tylenol and NSAIDS and start on SSRI - she reports being allergic to ZOLOFT ( hyperactivity, pressured feeling  , not Prozac either - which made her jittery) , suggested  Trazodone- did not work , Cymbalta not yet tried. Gabapentin failed. A lot of medicines tried and failed.   Lamictal may be best for her- new class of drugs. I will let her continue ativan at night.    2) I liked to have offered Depakote - IV and steroids. Unfortunatly, won't have a slot for her today.  3) I recommended to start gentle exercise regimen - she feels she lacks the energy.    The patient was advised of the  nature of the diagnosed disorder , the treatment options and the  risks for general health and wellness arising from not treating the condition.   I spent more than 60 minutes of face to face time with the patient.  Greater than 50% of time was spent in counseling and coordination of care. We have discussed the diagnosis and differential and I answered the patient's questions.    Plan:  Treatment plan and additional workup :  Lamictal 25 mg once a day for  2 weeks than 50 mg once a day for 14 days. , than 50 mg bid po.  Reduce Ativan to 50% of the tab size.    Larey Seat, MD   95/02/2256, 5:05 PM  Certified in Neurology by ABPN Certified in [place] by [facility] [address] [place],  [address]

## 2018-06-14 NOTE — Progress Notes (Signed)
ACUTE VISIT   HPI:  Chief Complaint  Patient presents with  . Dysuria    x 4 days    Brandy [name] is a 32 y.o. female, who is here today to follow on recent ER visit.  I saw her last on 04/20/18 since then she has been in the ER a few times and seen here for acute visits.  On 04/20/18 she presented to the ER because hematuria, intermittently for 3 weeks. Pressure sensation with urination and sharp,10/10 right flank pain.  Renal CT was done: 1. Migrated 4 mm LEFT urolithiasis in distal LEFT ureter resulting in mild obstructive uropathy. 2. Bilateral nephrolithiasis measuring to 2 mm. She has been taken Tylenol as needed.  She thinks she may already passed a stone, hematuria has resolved. She follows with urologist. Right flank pain has improved, achy, no radiated,constant. No sure about exacerbating or alleviating factors.  No abdominal pain,nasua,or vomiting. She has not noted fever or chills.  She is concerned about possible UTI. She has had "little" burning sensation. LMP 2 weeks ago.   She is frustrated because she is still having headaches. Intermittent pressure bitemporal,frontal,parietal,and sometimes occipital. It is worse when lying down but not resolve when she gets up.    She is requesting lab work done today because she has also noted hair loss and "muscle pain." Neck stiffness. She has seen about 2 neurologist, she states that one provider thought she may have a CSF leak and recommended blood patch but provider she was referred to for procedure did not think CSF leak was causing headaches. She was prescribed a new med for migraines and states that since she took medication "some type of reaction", "excruciating"generalized muscle and joint pain.  Still having dizziness, exacerbated by getting up. She has not been able to go back to work after she almost "passed out" while working. Nausea and vomiting have resolved.   Review of Systems    Constitutional: Positive for fatigue. Negative for activity change, appetite change and fever.  HENT: Negative for mouth sores, nosebleeds and trouble swallowing.   Eyes: Negative for redness and visual disturbance.  Respiratory: Negative for cough, shortness of breath and wheezing.   Cardiovascular: Negative for chest pain, palpitations and leg swelling.  Gastrointestinal: Negative for abdominal pain, nausea and vomiting.       Negative for changes in bowel habits.  Genitourinary: Positive for dysuria. Negative for decreased urine volume, flank pain, hematuria, vaginal bleeding and vaginal discharge.  Musculoskeletal: Positive for arthralgias, myalgias, neck pain and neck stiffness. Negative for joint swelling.  Neurological: Positive for dizziness and headaches. Negative for syncope and weakness.  Psychiatric/Behavioral: Negative for confusion. The patient is nervous/anxious.       Current Outpatient Medications on File Prior to Visit  Medication Sig Dispense Refill  . acetaminophen (TYLENOL) 500 MG tablet Take 1,000 mg by mouth every 6 (six) hours as needed for headache.    . Cholecalciferol (VITAMIN D) 2000 units CAPS Take 2,000 Units by mouth daily.    . Coenzyme Q10 (CO Q-10 PO) Take by mouth daily.    Marland Kitchen LORazepam (ATIVAN) 1 MG tablet Take 1 mg by mouth every 6 (six) hours as needed for anxiety.    Marland Kitchen MAGNESIUM PO Take by mouth daily.    . Omega-3 Fatty Acids (FISH OIL PO) Take by mouth daily.    . ondansetron (ZOFRAN ODT) 8 MG disintegrating tablet Take 1 tablet (8 mg total) by mouth every 8 (eight)  hours as needed for nausea or vomiting. 60 tablet 0   No current facility-administered medications on file prior to visit.      Past Medical History:  Diagnosis Date  . Anxiety   . Depression   . Gallstones   . History of nephrolithiasis   . Kidney stone   . Migraine   . Pituitary tumor   . Vitamin D deficiency    Allergies  Allergen Reactions  . Metoclopramide  Palpitations    Rapid heart rate  . Penicillins Hives, Other (See Comments) and Rash    Childhood reaction Has patient had a PCN reaction causing immediate rash, facial/tongue/throat swelling, SOB or lightheadedness with hypotension: No Has patient had a PCN reaction causing severe rash involving mucus membranes or skin necrosis: No Has patient had a PCN reaction that required hospitalization No Has patient had a PCN reaction occurring within the last 10 years: No If all of the above answers are "NO", then may proceed with Cephalosporin use.  Childhood reaction Has patient had a PCN reaction causing immediate rash, facial/tongue/throat swelling, SOB or lightheadedness with hypotension: No Has patient had a PCN reaction causing severe rash involving mucus membranes or skin necrosis: No Has patient had a PCN reaction that required hospitalization No Has patient had a PCN reaction occurring within the last 10 years: No If all of the above answers are "NO", then may proceed with Cephalosporin use.  Eduard Roux Other (See Comments)    Other reaction(s): Myalgias (intolerance) Constipation, hair loss  . Sumatriptan Palpitations  . Zoloft [Sertraline Hcl] Anxiety    Social History   Socioeconomic History  . Marital status: Married    Spouse name: Jonathon  . Number of children: 1  . Years of education: Not on file  . Highest education level: Bachelor's degree (e.g., BA, AB, BS)  Occupational History  . 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 Financial resource strain: Not on file  . Food insecurity:    Worry: Not on file    Inability: Not on file  . Transportation needs:    Medical: Not on file    Non-medical: Not on file  Tobacco Use  . Smoking status: Never Smoker  . Smokeless tobacco: Never Used  Substance and Sexual Activity  . Alcohol use: Not Currently    Frequency: Never  . Drug use: No  . Sexual activity: Yes    Lifestyle  . Physical activity:    Days per week: Not on file    Minutes per session: Not on file  . Stress: Not on file  Relationships  . Social connections:    Talks on phone: Not on file    Gets together: Not on file    Attends religious service: Not on file    Active member of club or organization: Not on file    Attends meetings of clubs or organizations: Not on file    Relationship status: Not on file  Other Topics Concern  . Not on file  Social History Narrative   Patient is right-handed. She lives with her husband and child in a split-level home.    Daughter born 01/2018   She avoids caffeine.    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   Physical Exam  Nursing note and vitals reviewed. Constitutional: She is oriented to person, place, and time. She appears well-developed and well-nourished. No distress.  HENT:  Head: Normocephalic and atraumatic.  Mouth/Throat: Oropharynx is clear and moist. Mucous membranes are dry.  Eyes: Conjunctivae are normal.  Cardiovascular: Normal rate and regular rhythm.  No murmur heard. Pulses:      Dorsalis pedis pulses are 2+ on the right side, and 2+ on the left side.  Respiratory: Effort normal and breath sounds normal. No respiratory distress.  GI: Soft. She exhibits no mass. There is no tenderness. There is no CVA tenderness.  Musculoskeletal: She exhibits no edema.       Thoracic back: She exhibits no tenderness and no bony tenderness.       Lumbar back: She exhibits no tenderness and no bony tenderness.  Lymphadenopathy:    She has no cervical adenopathy.  Neurological: She is alert and oriented to person, place, and time. She has normal strength. No cranial nerve deficit. Gait normal.  Skin: Skin is warm. No rash noted. No erythema.  Psychiatric: Her mood appears anxious. Her  affect is labile.  Fairly groomed,good eye contact.      ASSESSMENT AND PLAN:   Ms. Jaylise was seen today for dysuria.  Diagnoses and all orders for this visit:  Dysuria  Possible causes discussed. UCx on 04/20/18 grew multiple species,contamination. Urine dipstick today + for ketones, otherwise negative. I offered to repeat Ucx but she does not think it is necessary. Instructed about warning signs.  -     POC Urinalysis Dipstick  Dizziness Stable. ? Orthostatic.  Adequate hydration and fall prevention.  Right flank pain Possible causes discussed, ? Musculoskeletal. It has improved. Continue Tylenol 500 mg tid as needed. Instructed about warning signs.  Headache, unspecified headache type ? Tension headache vs migraine. She has had brain imaging a few times.  She has appt today with Dr Brett Fairy. Instructed about warning signs.  Myalgia  ? Fibromyalgia, side effect from med. Continue Tylenol for now. Further recommendations according to lab results.  -     Basic metabolic panel -     TSH -     C-reactive protein -     CK  Bilateral nephrolithiasis  Instructed to strain urine, strainer given. Adequate fluid intake. She will continue following with urologist.    Return if symptoms worsen or fail to improve.      Princetta Uplinger G. Martinique, MD  [facility]. [place] office.

## 2018-06-14 NOTE — Telephone Encounter (Signed)
Pt. States she will call back to schedule 3 mo f/u // [email or web address]

## 2018-06-16 ENCOUNTER — Encounter: Payer: Self-pay | Admitting: Family Medicine

## 2018-06-20 ENCOUNTER — Encounter: Payer: Self-pay | Admitting: Emergency Medicine

## 2018-06-20 ENCOUNTER — Telehealth: Payer: Self-pay | Admitting: *Deleted

## 2018-06-20 NOTE — Telephone Encounter (Signed)
Copied from [place] [id number]. Topic: General - Other >> Jun 16, 2018 10:03 AM Yvette Rack wrote: Reason for CRM: pt calling about lab results

## 2018-06-27 ENCOUNTER — Telehealth: Payer: Self-pay | Admitting: Neurology

## 2018-06-27 NOTE — Telephone Encounter (Signed)
Patient is calling in with extremely bad head pain. She has a horrible migraine. Wanting to know if there is anything that she can take to help her. None of her medication is working. Please call her back at [phone]. Thanks!

## 2018-06-28 ENCOUNTER — Encounter: Payer: Self-pay | Admitting: Family Medicine

## 2018-06-28 ENCOUNTER — Ambulatory Visit (INDEPENDENT_AMBULATORY_CARE_PROVIDER_SITE_OTHER): Payer: 59 | Admitting: Family Medicine

## 2018-06-28 VITALS — BP 124/60 | HR 96 | Temp 98.1°F | Resp 12 | Wt 184.8 lb

## 2018-06-28 DIAGNOSIS — R51 Headache: Secondary | ICD-10-CM

## 2018-06-28 DIAGNOSIS — R519 Headache, unspecified: Secondary | ICD-10-CM

## 2018-06-28 DIAGNOSIS — M791 Myalgia, unspecified site: Secondary | ICD-10-CM | POA: Diagnosis not present

## 2018-06-28 DIAGNOSIS — R5382 Chronic fatigue, unspecified: Secondary | ICD-10-CM | POA: Diagnosis not present

## 2018-06-28 DIAGNOSIS — F411 Generalized anxiety disorder: Secondary | ICD-10-CM

## 2018-06-28 NOTE — Assessment & Plan Note (Addendum)
This has been a chronic problem. We discussed different possible etiologies. Anxiety and depression could be aggravating factors. Extensive work-up has been otherwise negative. Vitamin D deficiency, she is currently taking vitamin D 2000 units daily.

## 2018-06-28 NOTE — Patient Instructions (Signed)
A few things to remember from today's visit:   Headache, unspecified headache type  Myalgia  Chronic fatigue Headache could be tension headache, I recommend starting amitriptyline, this medication may help with headache, "nerve pain." Please keep appointment with neurologist and psychiatrist.  Please be sure medication list is accurate. If a new problem present, please set up appointment sooner than planned today.

## 2018-06-28 NOTE — Telephone Encounter (Signed)
Called LMOVM for Pt to return my call 

## 2018-06-28 NOTE — Progress Notes (Signed)
ACUTE VISIT   HPI:  Chief Complaint  Patient presents with  . Follow-up    body pain and headaches have not improved, headaches all day, stabbing pain behind the eyes, pt would like a referral    [name] is a 32 y.o. female, who is here today complaining of persistent generalized body aches. These symptoms are not new, she feels like they are getting worse.  She has seen 3 neurologist, she saw Dr. Brett Fairy on 06/14/2017.  Lamictal 25 mg was recommended but she has not started medication, she is afraid of having side effects. Her psychiatrist also recommended amitriptyline 25 mg, which she has not started yet. She is positive "something is off", she has had extensive work-up that include lab work, head and spine imaging.  History of anxiety, she is followed with psychiatrist.  Next appointment on 07/07/2017 but she is thinking about establishing with a different psychiatrist at [facility]. She denies suicidal thoughts. She is having frequent panic attacks during the day, she is on Ativan 1 mg 4 times per day. She has not identified exacerbating or alleviating factors. She is back to work but she is having difficulties performing her job.  She is living with her husband and daughter.  Intermittent lightheadedness, "nerve pain", joint pain, and muscle achy all over. She wonders if she has a rheumatologic disorder.  Lab Results  Component Value Date   CRP 0.2 (L) 06/14/2018   She is also concerned about thyroid function test progressively "getting lower", she wonders if symptoms are related to thyroid disease.  Lab Results  Component Value Date   TSH 0.58 06/14/2018    Pressure and stabbing-like headache.Bitemporal, retro-ocular, and left-sided occipital, "pretty bad." No associated numbness or tingling. Negative for photophobia or visual changes. Occasionally she has nausea. She is no longer having vomiting. No focal weakness. She is having daily  headaches.  Brain MRI in 11/2017, 12/2017, 02/2018, and 06/17/2018. No acute intracranial abnormality or substantial change from recent pituitary MRI 05/06/2018 or prior brain MRI 09/28/2011. In particular, no specific MR findings suggestive of intracranial hypotension.   Head CT on 10/25/2016. Neck MRI on 01/05/2018. Spine cervical MRI on 06/17/2018:No acute abnormality. No abnormal spinal canal fluid collection or abnormal enhancement.  She wonders if she needs to see a different neurologist at [facility].  Review of Systems  Constitutional: Positive for fatigue. Negative for activity change, appetite change and fever.  HENT: Negative for mouth sores, nosebleeds and trouble swallowing.   Eyes: Negative for redness and visual disturbance.  Respiratory: Negative for cough, shortness of breath and wheezing.   Cardiovascular: Positive for chest pain. Negative for palpitations and leg swelling.  Gastrointestinal: Negative for abdominal pain, nausea and vomiting.       Negative for changes in bowel habits.  Genitourinary: Negative for decreased urine volume, dysuria and hematuria.  Musculoskeletal: Positive for arthralgias and myalgias. Negative for gait problem and joint swelling.  Skin: Negative for pallor and rash.  Neurological: Positive for light-headedness and headaches. Negative for seizures, syncope and weakness.  Psychiatric/Behavioral: Negative for confusion. The patient is nervous/anxious.       Current Outpatient Medications on File Prior to Visit  Medication Sig Dispense Refill  . acetaminophen (TYLENOL) 500 MG tablet Take 1,000 mg by mouth every 6 (six) hours as needed for headache.    . Cholecalciferol (VITAMIN D) 2000 units CAPS Take 2,000 Units by mouth daily.    . Coenzyme Q10 (CO Q-10 PO) Take by  mouth daily.    Marland Kitchen MAGNESIUM PO Take by mouth daily.    . Omega-3 Fatty Acids (FISH OIL PO) Take by mouth daily.    . ondansetron (ZOFRAN ODT) 8 MG disintegrating tablet Take 1 tablet (8 mg  total) by mouth every 8 (eight) hours as needed for nausea or vomiting. 60 tablet 0   No current facility-administered medications on file prior to visit.     Past Medical History:  Diagnosis Date  . Anxiety   . Depression   . Gallstones   . History of nephrolithiasis   . Kidney stone   . Migraine   . Pituitary tumor   . Vitamin D deficiency    Allergies  Allergen Reactions  . Metoclopramide Palpitations    Rapid heart rate  . Penicillins Hives, Other (See Comments) and Rash    Childhood reaction Has patient had a PCN reaction causing immediate rash, facial/tongue/throat swelling, SOB or lightheadedness with hypotension: No Has patient had a PCN reaction causing severe rash involving mucus membranes or skin necrosis: No Has patient had a PCN reaction that required hospitalization No Has patient had a PCN reaction occurring within the last 10 years: No If all of the above answers are "NO", then may proceed with Cephalosporin use.  Childhood reaction Has patient had a PCN reaction causing immediate rash, facial/tongue/throat swelling, SOB or lightheadedness with hypotension: No Has patient had a PCN reaction causing severe rash involving mucus membranes or skin necrosis: No Has patient had a PCN reaction that required hospitalization No Has patient had a PCN reaction occurring within the last 10 years: No If all of the above answers are "NO", then may proceed with Cephalosporin use.  Eduard Roux Other (See Comments)    Other reaction(s): Myalgias (intolerance) Constipation, hair loss  . Luvox [Fluvoxamine] Other (See Comments)    fainted  . Sumatriptan Palpitations  . Zoloft [Sertraline Hcl] Anxiety   Family History  Problem Relation Age of Onset  . Diabetes Mother   . Mental illness Mother   . Asthma Mother   . Stomach cancer Mother   . Colon polyps Mother   . Heart disease Mother   . Hypertension Mother   . Arthritis Mother   . Diabetes Father   . Hypertension  Father   . Anxiety disorder Sister   . Colon cancer Maternal Grandmother   . Diabetes Paternal Grandmother   . Breast cancer Maternal Aunt   . Lung cancer Maternal Aunt   . Diabetes Paternal Aunt   . Diabetes Maternal Uncle   . Diabetes Maternal Uncle     Social History   Socioeconomic History  . Marital status: Married    Spouse name: Jonathon  . Number of children: 1  . Years of education: Not on file  . Highest education level: Bachelor's degree (e.g., BA, AB, BS)  Occupational History  . 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 Financial resource strain: Not on file  . Food insecurity:    Worry: Not on file    Inability: Not on file  . Transportation needs:    Medical: Not on file    Non-medical: Not on file  Tobacco Use  . Smoking status: Never Smoker  . Smokeless tobacco: Never Used  Substance and Sexual Activity  . Alcohol use: Not Currently    Frequency: Never  . Drug use: No  . Sexual activity: Yes  Lifestyle  . Physical  activity:    Days per week: Not on file    Minutes per session: Not on file  . Stress: Not on file  Relationships  . Social connections:    Talks on phone: Not on file    Gets together: Not on file    Attends religious service: Not on file    Active member of club or organization: Not on file    Attends meetings of clubs or organizations: Not on file    Relationship status: Not on file  Other Topics Concern  . Not on file  Social History Narrative   Patient is right-handed. She lives with her husband and child in a split-level home.    Daughter born 01/2018   She avoids caffeine.    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[employer]    Vitals:   06/28/18 1607  BP: 124/60  Pulse: 96  Resp: 12  Temp: 98.1 F (36.7 C)  SpO2: 98%   Body mass index is 27.29 kg/m.   Physical Exam  Nursing note and vitals reviewed. Constitutional: She is oriented to person, place, and  time. She appears well-developed and well-nourished. No distress.  HENT:  Head: Normocephalic and atraumatic.  Mouth/Throat: Oropharynx is clear and moist and mucous membranes are normal.  Eyes: Pupils are equal, round, and reactive to light. Conjunctivae are normal.  Cardiovascular: Normal rate and regular rhythm.  No murmur heard. Pulses:      Dorsalis pedis pulses are 2+ on the right side, and 2+ on the left side.  Respiratory: Effort normal and breath sounds normal. No respiratory distress.  GI: Soft. She exhibits no mass. There is no hepatomegaly. There is no tenderness.  Musculoskeletal: She exhibits no edema.       Cervical back: She exhibits tenderness and spasm. She exhibits normal range of motion.       Back:  Negative for trigger points tenderness. No deformities or signs of synovitis appreciated.  Lymphadenopathy:    She has no cervical adenopathy.  Neurological: She is alert and oriented to person, place, and time. She has normal strength. No cranial nerve deficit. Gait normal.  Reflex Scores:      Patellar reflexes are 2+ on the right side and 2+ on the left side. Skin: Skin is warm. No rash noted. No erythema.  Psychiatric: Her mood appears anxious. Her affect is labile.  Well groomed, good eye contact.      ASSESSMENT AND PLAN:   Ms. Rudi was seen today for follow-up.  Diagnoses and all orders for this visit:   Myalgia Possible causes discussed, ? Fibromyalgia to be considered but no trigger points present on examination. No signs of synovitis. Work-up has been negative for inflammatory process. Amitriptyline may help. Further recommendations will be given according to labs/imaging results.   Generalized anxiety disorder Not well controlled. Problem most likely contributing to her multiple complaints. She is not taking fluoxetine daily because she thinks it is aggravated headaches. We discussed some side effects of lorazepam. Strongly recommend  psychotherapy. Continue following with psychiatrist, she has seen different providers in the past few months, recommend definitely establishing with one.  Fatigue This has been a chronic problem. We discussed different possible etiologies. Anxiety and depression could be aggravating factors. Extensive work-up has been otherwise negative. Vitamin D deficiency, she is currently taking vitamin D 2000 units daily.  Headache, unspecified headache type We discussed different etiologies. ?  Tension headache. Strongly recommend trying amitriptyline, we discussed  some side effects. I do not think more head imaging is necessary at this time. Instructed about warning signs. Strongly recommend keeping follow-up appointment with Dr. Brett Fairy.   40 min face to face OV. > 50% was dedicated to discussion of all differential Dx, prognosis, treatment options, and some side effects of medications. We discussed treatment options, gabapentin she tried before and failed "loopy", she does not want to try this 1 again. Recommend trying amitriptyline 25 mg, we discussed some side effects.  We reviewed prior work-up and notes from different providers. Tried to reassured but she is certain "somethig is wrong" with her.     Return in about 2 months (around 08/28/2018) for HA.      Anneke Cundy G. Martinique, MD  [facility]. [place] office.

## 2018-06-28 NOTE — Assessment & Plan Note (Addendum)
We discussed different etiologies. ?  Tension headache. Strongly recommend trying amitriptyline, we discussed some side effects. I do not think more head imaging is necessary at this time. Instructed about warning signs. Strongly recommend keeping follow-up appointment with Dr. Brett Fairy.

## 2018-06-28 NOTE — Telephone Encounter (Signed)
Called and spoke with Pt. She would like to be considered for Botox. Went over past medications with her. She has tried Aimovig, is unable to take Topamax due to kidney stones, no beta blockers due to low blood pressure. She has been on Zoloft in the past and Lamictal while being seen at [facility]. She has an appt with [place] 07/11/18. She wants to know if there is anything she can take until then. Tramadol did not help.

## 2018-06-28 NOTE — Assessment & Plan Note (Signed)
Not well controlled. Problem most likely contributing to her multiple complaints. She is not taking fluoxetine daily because she thinks it is aggravated headaches. We discussed some side effects of lorazepam. Strongly recommend psychotherapy. Continue following with psychiatrist, she has seen different providers in the past few months, recommend definitely establishing with one.

## 2018-06-28 NOTE — Telephone Encounter (Signed)
Called Pt, LMOVM for her to return my call 

## 2018-06-29 NOTE — Telephone Encounter (Signed)
According to her chart, she has seen two other neurologists since her last visit with me.  She plans on following up with one of them.  Due to appropriate medical care, she should really be followed by one neurologist.  I do not feel comfortable prescribing a medication while she is being treated by other neurologists.  Otherwise, we can discuss it at her next appointment.

## 2018-06-30 ENCOUNTER — Ambulatory Visit: Payer: 59

## 2018-06-30 ENCOUNTER — Encounter: Payer: Self-pay | Admitting: Physician Assistant

## 2018-06-30 ENCOUNTER — Ambulatory Visit (INDEPENDENT_AMBULATORY_CARE_PROVIDER_SITE_OTHER): Payer: 59 | Admitting: Physician Assistant

## 2018-06-30 DIAGNOSIS — F53 Postpartum depression: Secondary | ICD-10-CM

## 2018-06-30 DIAGNOSIS — F411 Generalized anxiety disorder: Secondary | ICD-10-CM

## 2018-06-30 DIAGNOSIS — R0609 Other forms of dyspnea: Secondary | ICD-10-CM

## 2018-06-30 DIAGNOSIS — R002 Palpitations: Secondary | ICD-10-CM

## 2018-06-30 DIAGNOSIS — O99345 Other mental disorders complicating the puerperium: Secondary | ICD-10-CM

## 2018-06-30 MED ORDER — DULOXETINE HCL 20 MG PO CPEP: 20.0000 mg | ORAL_CAPSULE | Freq: Every day | ORAL | 1 refills | Status: DC

## 2018-06-30 MED ORDER — LORAZEPAM 1 MG PO TABS: 1.0000 mg | ORAL_TABLET | Freq: Four times a day (QID) | ORAL | 1 refills | Status: DC | PRN

## 2018-06-30 NOTE — Patient Instructions (Addendum)
Start NAC (may need to buy at [facility] or [facility])

## 2018-06-30 NOTE — Progress Notes (Signed)
[facility]  Patient ID: Brandy [name],  MRN: [id number]  PCP: Martinique, Betty G, MD  Date of Evaluation: 06/30/2018 Time spent:25 minutes  Chief Complaint:  Chief Complaint    Follow-up; Anxiety      HISTORY/CURRENT STATUS: HPI Overdue for anxiety follow-up.    Still very anxious. "They can't figure out what's wrong with me and that makes the anxiety worse.  And the anxiety causes depression.  I have headaches all the time.  They said I have migraines. And I took Aimovig and it's made me feel horrible.  I'm emotional and down.  I feel like a bad wife and mom cause I can't get a grip.  I have palpitations and they say it's anxiety.  But I don't think it is.  I'm seeing a cardiologist for that.  And I've seen 3 different neurologists.  One of them was absolutely sure I have a CSF leak, and then the specialist I saw said I don't have that, and an MRI showed I don't.  I still have headaches everyday.  I've got a Holter monitor on now.  I just don't know what's going on.I know I've got post-partum depression, but I don't think that's all it is.  I think something bad is going to happen to me.  One dr referred me to Neuropsychologist and I'm going to see them Dec.  And I'll be seeing a neurologist at [facility] in Jan."  Has missed a lot of work.  Likes her job and is happy with her 24 month old baby and has a good husband.  States she has everything she wants in life and doesn't know what's going on.   No SI/HI or AH/VH  Past medications for mental health diagnoses include: Prozac, Luvox, Elavil, Ativan, Xanax, Zoloft, Seroquel, lithium (1 pill), BuSpar, propranolol, Risperdal  Individual Medical History/ Review of Systems: Changes? :Yes see above  Allergies: Metoclopramide; Penicillins; Erenumab-aooe; Luvox [fluvoxamine]; Sumatriptan; and Zoloft [sertraline hcl]  Current Medications:  Current Outpatient Medications:  .  acetaminophen (TYLENOL) 500 MG tablet, Take 1,000 mg by mouth  every 6 (six) hours as needed for headache., Disp: , Rfl:  .  Cholecalciferol (VITAMIN D) 2000 units CAPS, Take 2,000 Units by mouth daily., Disp: , Rfl:  .  Coenzyme Q10 (CO Q-10 PO), Take by mouth daily., Disp: , Rfl:  .  LORazepam (ATIVAN) 1 MG tablet, Take 1 tablet (1 mg total) by mouth every 6 (six) hours as needed for anxiety., Disp: 60 tablet, Rfl: 1 .  MAGNESIUM PO, Take by mouth daily., Disp: , Rfl:  .  Omega-3 Fatty Acids (FISH OIL PO), Take by mouth daily., Disp: , Rfl:  .  ondansetron (ZOFRAN ODT) 8 MG disintegrating tablet, Take 1 tablet (8 mg total) by mouth every 8 (eight) hours as needed for nausea or vomiting., Disp: 60 tablet, Rfl: 0 .  DULoxetine (CYMBALTA) 20 MG capsule, Take 1 capsule (20 mg total) by mouth daily., Disp: 30 capsule, Rfl: 1 Medication Side Effects: Palpitations from Prozac   Family Medical/ Social History: Changes? Yes went back to work after maternity leave  [place]:  Last menstrual period 05/31/2018, not currently breastfeeding.There is no height or weight on file to calculate BMI.  General Appearance: Casual  Eye Contact:  Good  Speech:  Clear and Coherent  Volume:  Normal  Mood:  Anxious  Affect:  Anxious  Thought Process:  Goal Directed  Orientation:  Full (Time, Place, and Person)  Thought Content: Logical  Suicidal Thoughts:  No  Homicidal Thoughts:  No  Memory:  WNL  Judgement:  Good  Insight:  Good  Psychomotor Activity:  Normal  Concentration:  Concentration: Good  Recall:  Good  Fund of Knowledge: Good  Language: Good  Assets:  Desire for Improvement  ADL's:  Intact  Cognition: WNL  Prognosis:  Good    DIAGNOSES:    ICD-10-CM   1. Generalized anxiety disorder F41.1   2. 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  I have tried to reassure Louetta that  according to all the testing that she has had done, there is nothing serious going on.  I am glad she will be seeing a neuropsychologist sometime soon, and have again recommended seeing a therapist here in our office.  She will make an appointment today for that. We discussed starting Cymbalta.  She is willing to try more medication.  I will start at 20 mg until I see her the next time, using the lowest dose hoping that she does not have side effects from it and then stop the medication.  I have made her aware that this is a very low dose, and usually not therapeutic.  However if she tolerates it well, even within 2 weeks, she can give [place] a call and I will increase the dose.  Otherwise we will discuss at the next appointment. Continue all above medications. Start NAC supplements to help with anxiety. Return in 4 weeks.   Donnal Moat, PA-C

## 2018-06-30 NOTE — Telephone Encounter (Signed)
Called and LMOVM advising Pt to return call

## 2018-07-01 ENCOUNTER — Telehealth: Payer: Self-pay | Admitting: *Deleted

## 2018-07-01 DIAGNOSIS — T8069XA Other serum reaction due to other serum, initial encounter: Secondary | ICD-10-CM

## 2018-07-01 NOTE — Telephone Encounter (Signed)
Please fax note to employee health [phone]

## 2018-07-01 NOTE — Telephone Encounter (Signed)
I have not reviewed form but in general I cannot recommend against influenza vaccine. If there is a history of an allergy reaction to influenza vaccine or other relative contraindication, I usually refer patients to immunologist to evaluate and give recommendations accordingly.  Thanks, BJ

## 2018-07-01 NOTE — Telephone Encounter (Signed)
Copied from [facility] [id number]. Topic: General - Inquiry >> Jul 01, 2018 11:18 AM Judyann Munson wrote: Reason for CRM: Patient  is requesting a call back in regards to Opt out of the flu shot for her employer at [employer].  she stated she has been having side affects after her pregnancy with medications and is needing a note completed today or she will be unable to work. I advise the patient its no guarantee it will be completed today. She stated she understood and will give a call back after lunch if she doesn't hear a update in regards to this. Please advise

## 2018-07-01 NOTE — Telephone Encounter (Signed)
Left message on machine for patient to return our call CRM 

## 2018-07-04 NOTE — Telephone Encounter (Signed)
Called Pt, LMOVM advising her of Dr Richardo Priest message, and will discuss more at appointment, to call with any further questions

## 2018-07-06 NOTE — Telephone Encounter (Signed)
Spoke with patient and she is aware that a letter can not be written until further testing is done. Patient would like to know if she can have a referral to a specialist to be tested for autoimmune disease?

## 2018-07-07 ENCOUNTER — Telehealth: Payer: Self-pay | Admitting: Cardiology

## 2018-07-07 NOTE — Telephone Encounter (Signed)
Have left two messages to ask patient to call back to move appointment to an earlier time.

## 2018-07-08 NOTE — Progress Notes (Signed)
NEUROLOGY FOLLOW UP OFFICE NOTE  Brandy [name] [id number]  HISTORY OF PRESENT ILLNESS: Brandy [name] is a 32 year old right-handed female with depression, anxiety, pituitary tumor and kidney stones who follows up for headache.  UPDATE: She was started on Aimovig but headaches continued to be daily after a month.  She reports since the first dose of Aimovig, she has been experiencing nausea, constipation, arthralgias, myalgias, pain, hair loss and fatigue. She never continued the Aimovig after the first dose and symptoms persist.  TSH, B12, CRP, CK and aldolase were unremarkable.  Cambia and Ultracet were ineffective.  She went for another opinion at [facility].  MRI of brain with and without contrast from 06/17/18 showed no acute findings associated with intracranial hypotension.  67mm pituitary gland stable and stated to be within normal limits for age and gender.  MRI of cervical spine with and without contrast showed no abnormal fluid collection or enhancement.  Therefore, CSF leak was no longer suspected and she did not receive a blood patch.  She saw Dr. Brett Fairy at [facility].  Headaches are moderate to severe and constant. Current NSAIDS:  no Current analgesics:  no Current triptans:  no Current ergotamine:  no Current anti-emetic:  Zofran ODT 8mg  Current muscle relaxants:  no Current anti-anxiolytic:  Ativan 1mg  QID PRN Current sleep aide:  no Current Antihypertensive medications:  no Current Antidepressant medications: no Current Anticonvulsant medications:  no Current anti-CGRP:  Aimovig 70mg  Current Vitamins/Herbal/Supplements:  no Current Antihistamines/Decongestants:  no Other therapy:  no Other medication:  no  Caffeine:  no Alcohol:  no Smoker:   no Diet:  hydrates Exercise:  Not at this time Depression:  Some postpartum depression; Anxiety:  Yes Sleep:  poor No prior history of headache.  She is not breastfeeding  HISTORY: Onset:  April  2019, during first trimester of first pregnancy.  She had her child 02/02/18.  They have been worse since the birth.  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  Eye exam was normal.  Labs/hormones were unremarkable.  Repeat imaging since birth of her child has shown that it has decreased in size.  She has had several ED visits over the past month.  MRA of head and neck from 01/05/18 personally reviewed and was negative.  MRI of brain with and without contrast from 03/07/18 was personally reviewed and demonstrated known pituitary adenoma but otherwise no acute process.  MRV of head performed the same day was negative for dural sinus thrombosis.  Location:  Left sided, left retro-orbital/temporal/occipital, but now bilateral Quality:  Pressure, "weird sensations throughout head" Intensity:  Severe.  She denies thunderclap headache. Aura:  no Prodrome:  no Postdrome:  no Associated symptoms:  Nausea, vomiting, bilateral eye lacrimation.  She denies associated, photophobia, phonophobia, unilateral numbness or weakness. Duration:  Often wakes up with it.  Off and on throughout the day. Frequency:  daily Frequency of abortive medication: none Triggers:  no Exacerbating factors:  Standing up, exertion Relieving factors:  Laying down Activity:  aggravates  Past NSAIDS:  Ibuprofen, naproxen, Cambia Past analgesics:  Fioricet (ineffective), Excedrin Migraine, Tylenol, Ultracet Past abortive triptans:  Sumatriptan tablet (made her feel awful, palpitations) Past abortive ergotamine:  no Past muscle relaxants:   no Past anti-emetic:  no Past antihypertensive medications:  no Past antidepressant medications:  fluoxetine, sertraline, Effexor Past anticonvulsant medications:  lamogrigine Past anti-CGRP:  no Past vitamins/Herbal/Supplements:  no Past antihistamines/decongestants:  no Other past therapies:  none  Family history of headache:  no   PAST MEDICAL HISTORY: Past Medical  History:  Diagnosis Date  . Anxiety   . Depression   . Gallstones   . History of nephrolithiasis   . Kidney stone   . Migraine   . Pituitary tumor   . Vitamin D deficiency     MEDICATIONS: Current Outpatient Medications on File Prior to Visit  Medication Sig Dispense Refill  . acetaminophen (TYLENOL) 500 MG tablet Take 1,000 mg by mouth every 6 (six) hours as needed for headache.    . Cholecalciferol (VITAMIN D) 2000 units CAPS Take 2,000 Units by mouth daily.    . Coenzyme Q10 (CO Q-10 PO) Take by mouth daily.    . DULoxetine (CYMBALTA) 20 MG capsule Take 1 capsule (20 mg total) by mouth daily. 30 capsule 1  . LORazepam (ATIVAN) 1 MG tablet Take 1 tablet (1 mg total) by mouth every 6 (six) hours as needed for anxiety. 60 tablet 1  . MAGNESIUM PO Take by mouth daily.    . Omega-3 Fatty Acids (FISH OIL PO) Take by mouth daily.    . ondansetron (ZOFRAN ODT) 8 MG disintegrating tablet Take 1 tablet (8 mg total) by mouth every 8 (eight) hours as needed for nausea or vomiting. 60 tablet 0   No current facility-administered medications on file prior to visit.     ALLERGIES: Allergies  Allergen Reactions  . Metoclopramide Palpitations    Rapid heart rate  . Penicillins Hives, Other (See Comments) and Rash    Childhood reaction Has patient had a PCN reaction causing immediate rash, facial/tongue/throat swelling, SOB or lightheadedness with hypotension: No Has patient had a PCN reaction causing severe rash involving mucus membranes or skin necrosis: No Has patient had a PCN reaction that required hospitalization No Has patient had a PCN reaction occurring within the last 10 years: No If all of the above answers are "NO", then may proceed with Cephalosporin use.  Childhood reaction Has patient had a PCN reaction causing immediate rash, facial/tongue/throat swelling, SOB or lightheadedness with hypotension: No Has patient had a PCN reaction causing severe rash involving mucus membranes  or skin necrosis: No Has patient had a PCN reaction that required hospitalization No Has patient had a PCN reaction occurring within the last 10 years: No If all of the above answers are "NO", then may proceed with Cephalosporin use.  Eduard Roux Other (See Comments)    Other reaction(s): Myalgias (intolerance) Constipation, hair loss  . Luvox [Fluvoxamine] Other (See Comments)    fainted  . Sumatriptan Palpitations  . Zoloft [Sertraline Hcl] Anxiety    FAMILY HISTORY: Family History  Problem Relation Age of Onset  . Diabetes Mother   . Mental illness Mother   . Asthma Mother   . Stomach cancer Mother   . Colon polyps Mother   . Heart disease Mother   . Hypertension Mother   . Arthritis Mother   . Diabetes Father   . Hypertension Father   . Anxiety disorder Sister   . Colon cancer Maternal Grandmother   . Diabetes Paternal Grandmother   . Breast cancer Maternal Aunt   . Lung cancer Maternal Aunt   . Diabetes Paternal Aunt   . Diabetes Maternal Uncle   . Diabetes Maternal Uncle    SOCIAL HISTORY: Social History   Socioeconomic History  . Marital status: Married    Spouse name: Jonathon  . Number of children: 1  . Years of education:  Not on file  . Highest education level: Bachelor's degree (e.g., BA, AB, BS)  Occupational History  . 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 Financial resource strain: Not on file  . Food insecurity:    Worry: Not on file    Inability: Not on file  . Transportation needs:    Medical: Not on file    Non-medical: Not on file  Tobacco Use  . Smoking status: Never Smoker  . Smokeless tobacco: Never Used  Substance and Sexual Activity  . Alcohol use: Not Currently    Frequency: Never  . Drug use: No  . Sexual activity: Yes  Lifestyle  . Physical activity:    Days per week: Not on file    Minutes per session: Not on file  . Stress: Not on file  Relationships  . Social  connections:    Talks on phone: Not on file    Gets together: Not on file    Attends religious service: Not on file    Active member of club or organization: Not on file    Attends meetings of clubs or organizations: Not on file    Relationship status: Not on file  . Intimate partner violence:    Fear of current or ex partner: Not on file    Emotionally abused: Not on file    Physically abused: Not on file    Forced sexual activity: Not on file  Other Topics Concern  . Not on file  Social History Narrative   Patient is right-handed. She lives with her husband and child in a split-level home.    Daughter born 01/2018   She avoids caffeine.    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[facility] [employer] [facility]    REVIEW OF SYSTEMS: Constitutional: No fevers, chills, or sweats, no generalized fatigue, change in appetite Eyes: No visual changes, double vision, eye pain Ear, nose and throat: No hearing loss, ear pain, nasal congestion, sore throat Cardiovascular: No chest pain, palpitations Respiratory:  No shortness of breath at rest or with exertion, wheezes GastrointestinaI: nausea, constipation Genitourinary:  No dysuria, urinary retention or frequency Musculoskeletal:  Joint pain Integumentary: No rash, pruritus, skin lesions Neurological: as above Psychiatric: depression, anxiety Endocrine: No palpitations, fatigue, diaphoresis, mood swings, change in appetite, change in weight, increased thirst Hematologic/Lymphatic:  No purpura, petechiae. Allergic/Immunologic: no itchy/runny eyes, nasal congestion, recent allergic reactions, rashes  PHYSICAL EXAM: Blood pressure 96/64, pulse 78, height 5\' 8"  (1.727 m), weight 184 lb (83.5 kg), SpO2 98 %, not currently breastfeeding. General: No acute distress.  Patient appears well-groomed.   Head:  Normocephalic/atraumatic Eyes:  Fundi examined but not visualized Neck: supple, no paraspinal tenderness, full range of motion Heart:  Regular rate and  rhythm Lungs:  Clear to auscultation bilaterally Back: No paraspinal tenderness Neurological Exam: alert and oriented to person, place, and time. Attention span and concentration intact, recent and remote memory intact, fund of knowledge intact.  Speech fluent and not dysarthric, language intact.  CN II-XII intact. Bulk and tone normal, muscle strength 5/5 throughout.  Sensation to light touch  intact.  Deep tendon reflexes 2+ throughout.  Finger to nose testing intact.  Gait normal, Romberg negative.  IMPRESSION: Chronic migraine without aura, without status migrainosus, intractable Myalgias/arthralgias.  She does not appear to have a myopathy or neuropathy Anxiety  PLAN: 1.  Her therapist started her on Cymbalta 20mg  twice daily however she  has not started it yet due to concerns about possible side effects.  I suggested trying it, as it may address not only anxiety but also pain and headache.  If ineffective for headache, we will try Botox (she has had over 3 months of daily headaches and has failed multiple preventatives) 2.  We will check ANA to further evaluate her myalgias. 3.  While constant, pain relievers not likely to be effective.  Once episodic, she should limit use of pain relievers to no more than 2 days out of week to prevent risk of rebound or medication-overuse headache. 4.  Keep headache diary 5.  Continue management for anxiety   [name], DO  CC: Betty Martinique, MD

## 2018-07-10 NOTE — Telephone Encounter (Signed)
It is Ok to place referral to immunology due to reported influenza vaccine allergy. Thanks, BJ

## 2018-07-11 ENCOUNTER — Other Ambulatory Visit (INDEPENDENT_AMBULATORY_CARE_PROVIDER_SITE_OTHER): Payer: 59

## 2018-07-11 ENCOUNTER — Encounter: Payer: Self-pay | Admitting: Neurology

## 2018-07-11 ENCOUNTER — Ambulatory Visit (INDEPENDENT_AMBULATORY_CARE_PROVIDER_SITE_OTHER): Payer: 59 | Admitting: Neurology

## 2018-07-11 VITALS — BP 96/64 | HR 78 | Ht 68.0 in | Wt 184.0 lb

## 2018-07-11 DIAGNOSIS — G43719 Chronic migraine without aura, intractable, without status migrainosus: Secondary | ICD-10-CM

## 2018-07-11 DIAGNOSIS — R6889 Other general symptoms and signs: Secondary | ICD-10-CM

## 2018-07-11 DIAGNOSIS — F419 Anxiety disorder, unspecified: Secondary | ICD-10-CM | POA: Diagnosis not present

## 2018-07-11 NOTE — Patient Instructions (Addendum)
1.  Start the Cymbalta.  If it is effective for anxiety but not for the headache, we can try Botox 2.  As long as headaches are constant, pain relievers will not likely be effective.  However, once the headaches are episodic, limit use of pain relievers to no more than 2 days out of week to prevent risk of rebound or medication-overuse headache. 3.  Keep headache diary 4.  Follow up in 3 to 4 months.

## 2018-07-11 NOTE — Telephone Encounter (Signed)
Referral placed.

## 2018-07-12 ENCOUNTER — Ambulatory Visit: Payer: 59 | Admitting: Psychiatry

## 2018-07-12 DIAGNOSIS — F411 Generalized anxiety disorder: Secondary | ICD-10-CM | POA: Diagnosis not present
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
## 2018-07-13 ENCOUNTER — Telehealth: Payer: Self-pay | Admitting: Neurology

## 2018-07-13 ENCOUNTER — Ambulatory Visit: Payer: 59 | Admitting: Gastroenterology

## 2018-07-13 LAB — ANA: Anti Nuclear Antibody(ANA): NEGATIVE

## 2018-07-13 NOTE — Telephone Encounter (Signed)
Patient is calling in to see if her recent lab work results are back. Please call her back at [phone]. Thanks!

## 2018-07-14 ENCOUNTER — Ambulatory Visit (INDEPENDENT_AMBULATORY_CARE_PROVIDER_SITE_OTHER): Payer: 59 | Admitting: Cardiology

## 2018-07-14 ENCOUNTER — Telehealth: Payer: Self-pay | Admitting: *Deleted

## 2018-07-14 ENCOUNTER — Encounter: Payer: Self-pay | Admitting: *Deleted

## 2018-07-14 ENCOUNTER — Ambulatory Visit: Payer: 59 | Admitting: Psychiatry

## 2018-07-14 ENCOUNTER — Encounter: Payer: Self-pay | Admitting: Cardiology

## 2018-07-14 VITALS — BP 118/72 | HR 84

## 2018-07-14 DIAGNOSIS — R0609 Other forms of dyspnea: Secondary | ICD-10-CM | POA: Diagnosis not present

## 2018-07-14 DIAGNOSIS — Z7189 Other specified counseling: Secondary | ICD-10-CM | POA: Diagnosis not present

## 2018-07-14 DIAGNOSIS — R0789 Other chest pain: Secondary | ICD-10-CM | POA: Diagnosis not present

## 2018-07-14 DIAGNOSIS — R002 Palpitations: Secondary | ICD-10-CM | POA: Diagnosis not present

## 2018-07-14 NOTE — Telephone Encounter (Signed)
I sent results via My Chart.

## 2018-07-14 NOTE — Telephone Encounter (Signed)
-----   Message from Pieter Partridge, DO sent at 07/14/2018  8:52 AM EST ----- ANA is negative

## 2018-07-14 NOTE — Telephone Encounter (Signed)
Results sent via My Chart.  

## 2018-07-14 NOTE — Progress Notes (Signed)
Cardiology Office Note:    Date:  07/14/2018   ID:  Brandy [name], DOB 07-11-1986, MRN [id number]  PCP:  Martinique, Betty G, MD  Cardiologist:  Buford Dresser, MD PhD  Referring MD: Martinique, Betty G, MD   CC: follow up  History of Present Illness:    Brandy [name] is a 32 y.o. female with a hx of anxiety/postpartum depression who is seen in follow up for the evaluation and management of palpitations, shortness of breath. Her initial consult was on 05/31/18.   Cardiac history: Has a baby summer 2019. Has been having palpitations and shortness of breath since then. Has noted that her heart rate has jumped up 30-40 beats when exerting herself or moving around. When she exerts herself to a minimal amount, she feels like she might pass out, gets sweaty. Had noted high blood pressure, now back to her normal. Occurs even with walking a short distance. 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 Since she has had the baby, has been struggling. Lots of headaches. Minimal activity, though she is back to work. Heart rate goes up, feels short of breath, diaphoretic. Sharp back pain with movement, dull chest pain with movement or deep breathing.  She has been seen in the ER for this, as well as by Dr. Nehemiah Massed at [facility]. Workup thus far has been unrevealing. Normal echo in Care Everywhere. Normal treadmill stress test. No significant arrhythmia seen on holter monitor. ECG from 8.21.19 was sinus tach at 147 bpm. TSH is normal.  Had full syncope about 2 mos ago, had very stressful event (mom thought she was having heart attack) and had just started a new antidepressant. No syncope more recently. No PND, orthopnea, LE edema.   Follow up today: overall feels worse. Continues to have feelings like her heart is beating either fast or slow. Did wear 48 hour monitor, results not back yet. Has tried several medications for headaches and anxiety,  nothing seems to be helping and many make her feel worse. Very stressed today about her symptoms. No complete syncope, but frequent palpitations, very fatigued, has difficulty doing much beyond her job and caring for her daughter.  Past Medical History:  Diagnosis Date  . Anxiety   . Depression   . Gallstones   . History of nephrolithiasis   . Kidney stone   . Migraine   . Pituitary tumor   . Vitamin D deficiency     Past Surgical History:  Procedure Laterality Date  . CHOLECYSTECTOMY  2014  . [place], 2004  . MOUTH SURGERY    . TYMPANOSTOMY TUBE PLACEMENT      Current Medications: Current Outpatient Medications on File Prior to Visit  Medication Sig  . acetaminophen (TYLENOL) 500 MG tablet Take 1,000 mg by mouth every 6 (six) hours as needed for headache.  . Cholecalciferol (VITAMIN D) 2000 units CAPS Take 2,000 Units by mouth daily.  . Coenzyme Q10 (CO Q-10 PO) Take by mouth daily.  Marland Kitchen LORazepam (ATIVAN) 1 MG tablet Take 1 tablet (1 mg total) by mouth every 6 (six) hours as needed for anxiety.  Marland Kitchen MAGNESIUM PO Take by mouth daily.  . mirtazapine (REMERON) 7.5 MG tablet Take 7.5 mg by mouth at bedtime.  . Omega-3 Fatty Acids (FISH OIL PO) Take by mouth daily.  . ondansetron (ZOFRAN ODT) 8 MG disintegrating tablet Take 1 tablet (8 mg total) by mouth every 8 (eight)  hours as needed for nausea or vomiting.   No current facility-administered medications on file prior to visit.      Allergies:   Metoclopramide; Penicillins; Erenumab-aooe; Luvox [fluvoxamine]; Sumatriptan; and Zoloft [sertraline hcl]   Social History   Socioeconomic History  . Marital status: Married    Spouse name: Jonathon  . Number of children: 1  . Years of education: Not on file  . Highest education level: Bachelor's degree (e.g., BA, AB, BS)  Occupational History  . 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 Financial resource  strain: Not on file  . Food insecurity:    Worry: Not on file    Inability: Not on file  . Transportation needs:    Medical: Not on file    Non-medical: Not on file  Tobacco Use  . Smoking status: Never Smoker  . Smokeless tobacco: Never Used  Substance and Sexual Activity  . Alcohol use: Not Currently    Frequency: Never  . Drug use: No  . Sexual activity: Yes  Lifestyle  . Physical activity:    Days per week: Not on file    Minutes per session: Not on file  . Stress: Not on file  Relationships  . Social connections:    Talks on phone: Not on file    Gets together: Not on file    Attends religious service: Not on file    Active member of club or organization: Not on file    Attends meetings of clubs or organizations: Not on file    Relationship status: Not on file  Other Topics Concern  . Not on file  Social History Narrative   Patient is right-handed. She lives with her husband and child in a split-level home.    Daughter born 01/2018   She avoids caffeine.    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[employer]     Family History: The patient's family history includes Anxiety disorder in her sister; Arthritis in her mother; Asthma in her mother; Breast cancer in her maternal aunt; Colon cancer in her maternal grandmother; Colon polyps in her mother; Diabetes in her father, maternal uncle, maternal uncle, mother, paternal aunt, and paternal grandmother; Heart disease in her mother; Hypertension in her father and mother; Lung cancer in her maternal aunt; Mental illness in her mother; Stomach cancer in her mother. Mom with CAD (50% left main), aortic atherosclerosis, heart failure. Has had 40+ surgeries, 150 kidney stones, multiple back issues, GI bleeding, diabetes, HF. Uncle had MI age 14, had arrhythmia. Gparents with heart failure. Father is diabete, type II, but very thin, frequent emesis.  ROS:   Please see the history of present illness.  Additional pertinent  ROS:  Constitutional: Negative for chills, fever, night sweats HENT: Negative for ear pain and hearing loss.   Eyes: Negative for loss of vision and eye pain.  Respiratory: Negative for cough, sputum, shortness of breath, wheezing.   Cardiovascular: Positive for chest tightness, palpitations. Negative for PND, orthopnea, lower extremity edema and claudication.  Gastrointestinal: Negative for abdominal pain, melena, and hematochezia.  Genitourinary: Negative for dysuria and hematuria.  Musculoskeletal: Negative for falls and myalgias.  Skin: Negative for itching and rash.  Neurological: Negative for focal weakness, focal sensory changes and loss of consciousness. Positive for chronic headaches. Endo/Heme/Allergies: Does not bruise/bleed easily.   EKGs/Labs/Other Studies Reviewed:    The following studies were reviewed today: Echo, Holter,  treadmill stress reports reviewed in Care Everywhere. Prior notes reviewed.  EKG:  EKG is personally reviewed.  The ekg ordered previously demonstrates normal sinus rhythm with sinus arrhythmia.  Recent Labs: 03/25/2018: B Natriuretic Peptide 15.0 04/20/2018: ALT 39; Hemoglobin 12.9; Platelets 289 06/14/2018: BUN 9; Creatinine, Ser 0.80; Potassium 4.0; Sodium 139; TSH 0.58  Recent Lipid Panel    Component Value Date/Time   CHOL 196 05/31/2018 1023   TRIG 76 05/31/2018 1023   HDL 53 05/31/2018 1023   CHOLHDL 3.7 05/31/2018 1023   CHOLHDL 3 09/24/2014 0837   VLDL 9.6 09/24/2014 0837   LDLCALC 128 (H) 05/31/2018 1023    Physical Exam:    VS:  BP 118/72   Pulse 84     Wt Readings from Last 3 Encounters:  07/11/18 184 lb (83.5 kg)  06/28/18 184 lb 12.8 oz (83.8 kg)  06/14/18 185 lb (83.9 kg)     GEN: Well nourished, well developed in no acute distress HEENT: Normal NECK: No JVD; No carotid bruits LYMPHATICS: No lymphadenopathy CARDIAC: regular rhythm, normal S1 and S2, no murmurs, rubs, gallops. Radial and DP pulses 2+  bilaterally. RESPIRATORY:  Clear to auscultation without rales, wheezing or rhonchi  ABDOMEN: Soft, non-tender, non-distended MUSCULOSKELETAL:  No edema; No deformity  SKIN: Warm and dry NEUROLOGIC:  Alert and oriented x 3 PSYCHIATRIC:  Normal affect   ASSESSMENT:    1. Heart palpitations   2. Other chest pain   3. Dyspnea on exertion   4. Counseling on health promotion and disease prevention    PLAN:    1. Heart palpitations, dyspnea on exertion, chest pain:   -has already had normal echo, normal treadmill stress, normal holter through Dr. Alveria Apley office at [facility].   -48 hour holter pending results. She cannot recall if she had any symptomatic events on the monitor.  -discussed that given her baseline ECG and prior testing, it is reasonable to try gentle exercise to try to increase her baseline conditioning and increase her vagal tone.  -would avoid presumptive treatment with beta blocker if this is sinus tachycardia given risk of worsening of fatigue  -She has been under a lot of stress since the birth of her daughter, and she continues to try to find the right treatment to improve her symptoms. Offered understanding and support.   Prevention:  -recommend heart healthy/Mediterranean diet, with whole grains, fruits, vegetable, fish, lean meats, nuts, and olive oil. Limit salt. -recommend moderate walking, 3-5 times/week for 30-50 minutes each session. Aim for at least 150 minutes.week. Goal should be pace of 3 miles/hours, or walking 1.5 miles in 30 minutes -recommend avoidance of tobacco products. Avoid excess alcohol. -Additional risk factor control:  -Diabetes: A1c is 5.4  -Lipids: DLD 128, HDL 53, Tchol 196, TG 76  -Blood pressure control: at goal  -Weight: BMI 28.  Plan for follow up: ~2 months to monitor symptoms.  Medication Adjustments/Labs and Tests Ordered: Current medicines are reviewed at length with the patient today.  Concerns regarding medicines are  outlined above.  No orders of the defined types were placed in this encounter.  No orders of the defined types were placed in this encounter.   Patient Instructions  Medication Instructions:  Your Physician recommend you continue on your current medication as directed.    If you need a refill on your cardiac medications before your next appointment, please call your pharmacy.   Lab work: None  Testing/Procedures: None  Follow-Up: Your physician recommends that you  schedule a follow-up appointment in 2 months with Dr. Harrell Gave.   Any Other Special Instructions Will Be Listed Below (If Applicable).      Signed, Buford Dresser, MD PhD 07/14/2018 5:13 PM    [facility]

## 2018-07-14 NOTE — Patient Instructions (Signed)
Medication Instructions:  Your Physician recommend you continue on your current medication as directed.    If you need a refill on your cardiac medications before your next appointment, please call your pharmacy.   Lab work: None  Testing/Procedures: None  Follow-Up: Your physician recommends that you schedule a follow-up appointment in 2 months with Dr. Harrell Gave.   Any Other Special Instructions Will Be Listed Below (If Applicable).

## 2018-07-15 ENCOUNTER — Telehealth: Payer: Self-pay | Admitting: Cardiology

## 2018-07-20 ENCOUNTER — Encounter: Payer: Self-pay | Admitting: Gastroenterology

## 2018-07-20 ENCOUNTER — Ambulatory Visit (INDEPENDENT_AMBULATORY_CARE_PROVIDER_SITE_OTHER): Payer: 59 | Admitting: Gastroenterology

## 2018-07-20 VITALS — BP 110/64 | HR 84 | Ht 69.0 in | Wt 190.8 lb

## 2018-07-20 DIAGNOSIS — R1013 Epigastric pain: Secondary | ICD-10-CM | POA: Diagnosis not present

## 2018-07-20 DIAGNOSIS — K219 Gastro-esophageal reflux disease without esophagitis: Secondary | ICD-10-CM

## 2018-07-20 DIAGNOSIS — R1310 Dysphagia, unspecified: Secondary | ICD-10-CM

## 2018-07-20 HISTORY — DX: Dysphagia, unspecified: R13.10

## 2018-07-20 NOTE — Progress Notes (Signed)
07/20/2018 Brandy [name] [id number] 1985-12-05   HISTORY OF PRESENT ILLNESS: This is a 32 year old female who is a patient of Dr. Celesta Aver.  She was last seen here by him in September 2019.  Please see that office note for in-depth detail regarding her symptoms at that point.  Anyway, he had recommended EGD although he thought her symptoms were functional.  She did not proceed, but would like to reschedule at this point.  She says that over the past 3 weeks her symptoms have become much more severe.  Complaining of severe epigastric pain.  She says that when she is eating it feels like food is not going down.  She had been avoiding coffee, but tried to drink some the other day and it made her pain much worse.  She is unable to take PPIs as pantoprazole made her pain worse.  She has tried Pepcid and Zantac recently and has most recently been taking Pepcid 40 mg daily.  She also reports stools alternating between constipation and loose stool/diarrhea.  She says that she thinks her hormones are still at all out of whack since having her daughter 5 months ago.   Past Medical History:  Diagnosis Date  . Anxiety   . Depression   . Gallstones   . History of nephrolithiasis   . Kidney stone   . Migraine   . Pituitary tumor   . Vitamin D deficiency    Past Surgical History:  Procedure Laterality Date  . CHOLECYSTECTOMY  2014  . KIDNEY STONE SURGERY     x 5  . MOUTH SURGERY    . TYMPANOSTOMY TUBE PLACEMENT      reports that she has never smoked. She has never used smokeless tobacco. She reports that she drank alcohol. She reports that she does not use drugs. family history includes Anxiety disorder in her sister; Arthritis in her mother; Asthma in her mother; Breast cancer in her maternal aunt; Colon cancer in her maternal grandmother; Colon polyps in her mother; Diabetes in her father, maternal uncle, maternal uncle, mother, paternal aunt, and paternal grandmother; Heart disease in her  mother; Hypertension in her father and mother; Lung cancer in her maternal aunt; Mental illness in her mother; Stomach cancer in her mother. Allergies  Allergen Reactions  . Metoclopramide Palpitations    Rapid heart rate  . Penicillins Hives, Other (See Comments) and Rash    Childhood reaction Has patient had a PCN reaction causing immediate rash, facial/tongue/throat swelling, SOB or lightheadedness with hypotension: No Has patient had a PCN reaction causing severe rash involving mucus membranes or skin necrosis: No Has patient had a PCN reaction that required hospitalization No Has patient had a PCN reaction occurring within the last 10 years: No If all of the above answers are "NO", then may proceed with Cephalosporin use.  Childhood reaction Has patient had a PCN reaction causing immediate rash, facial/tongue/throat swelling, SOB or lightheadedness with hypotension: No Has patient had a PCN reaction causing severe rash involving mucus membranes or skin necrosis: No Has patient had a PCN reaction that required hospitalization No Has patient had a PCN reaction occurring within the last 10 years: No If all of the above answers are "NO", then may proceed with Cephalosporin use.  Eduard Roux Other (See Comments)    Other reaction(s): Myalgias (intolerance) Constipation, hair loss  . Luvox [Fluvoxamine] Other (See Comments)    fainted  . Sumatriptan Palpitations  . Zoloft [Sertraline Hcl] Anxiety  Outpatient Encounter Medications as of 07/20/2018  Medication Sig  . acetaminophen (TYLENOL) 500 MG tablet Take 1,000 mg by mouth every 6 (six) hours as needed for headache.  . Cholecalciferol (VITAMIN D) 2000 units CAPS Take 2,000 Units by mouth daily.  . Coenzyme Q10 (CO Q-10 PO) Take by mouth daily.  Marland Kitchen LORazepam (ATIVAN) 1 MG tablet Take 1 tablet (1 mg total) by mouth every 6 (six) hours as needed for anxiety.  Marland Kitchen MAGNESIUM PO Take by mouth daily.  . mirtazapine (REMERON) 7.5 MG  tablet Take 7.5 mg by mouth at bedtime.  . Omega-3 Fatty Acids (FISH OIL PO) Take by mouth daily.  . ondansetron (ZOFRAN ODT) 8 MG disintegrating tablet Take 1 tablet (8 mg total) by mouth every 8 (eight) hours as needed for nausea or vomiting.   No facility-administered encounter medications on file as of 07/20/2018.      REVIEW OF SYSTEMS  : All other systems reviewed and negative except where noted in the History of Present Illness.   PHYSICAL EXAM: BP 110/64   Pulse 84   Ht 5\' 9"  (1.753 m)   Wt 190 lb 12.8 oz (86.5 kg)   BMI 28.18 kg/m  General: Well developed white female in no acute distress Head: Normocephalic and atraumatic Eyes:  Sclerae anicteric, conjunctiva pink. Ears: Normal auditory acuity Lungs: Clear throughout to auscultation; no increased WOB. Heart: Regular rate and rhythm; no M/R/G. Abdomen: Soft, non-distended.  BS present.  Mild epigastric and LUQ TTP. Musculoskeletal: Symmetrical with no gross deformities  Skin: No lesions on visible extremities Extremities: No edema  Neurological: Alert oriented x 4, grossly non-focal Psychological:  Alert and cooperative. Normal mood and affect  ASSESSMENT AND PLAN: *32 year old female with epigastric abdominal pain, history of GERD, dysphasia versus feeling of fullness: Suspect that her symptoms are functional, but EGD had been recommended by Dr. Carlean Purl a couple of months ago and I think that that is very reasonable as well.  Will schedule that again and hopefully she will proceed this time.  Reports that PPI hurts her stomach.  She is currently on Pepcid 40 mg daily and we will increase that to twice daily for now see if that gives her any further relief of her symptoms.  ? Gastroparesis, but discussed how treatment options on limited. *Alternating bowel habits between constipation and loose stool/diarrhea: She has been complaining of a lot of hormonal type issues so question if this could be related to that.  As stated  above, suspect functional symptoms so could have component of IBS as well.  Recommend starting a daily powder fiber supplement such as Benefiber or Citrucel daily.  **The risks, benefits, and alternatives to EGD were discussed with the patient and she consents to proceed.   CC:  Martinique, Betty G, MD

## 2018-07-20 NOTE — Patient Instructions (Addendum)
You have been scheduled for an endoscopy. Please follow written instructions given to you at your visit today. If you use inhalers (even only as needed), please bring them with you on the day of your procedure. Increase the pepcid to to mg twice daily. Take Daily Benefiber or Citrucel powder- 2 teaspoons in 8 oz of liquid.   Normal BMI (Body Mass Index- based on height and weight) is between 19 and 25. Your BMI today is Body mass index is 28.18 kg/m.  Please consider follow up  regarding your BMI with your Primary Care Provider.

## 2018-07-25 ENCOUNTER — Encounter

## 2018-07-26 ENCOUNTER — Ambulatory Visit (INDEPENDENT_AMBULATORY_CARE_PROVIDER_SITE_OTHER): Payer: 59 | Admitting: Psychiatry

## 2018-07-26 DIAGNOSIS — F411 Generalized anxiety disorder: Secondary | ICD-10-CM

## 2018-07-26 NOTE — Progress Notes (Signed)
      [facility] Counselor/Therapist Progress Note  Patient ID: Brandy [name], MRN: [id number],    Date: 07/26/2018  Time Spent: 48 minutes   Treatment Type: Individual Therapy  Reported Symptoms: Depressed mood, Anxious Mood, Panic Attacks, Ruminations, Irritability, Fatigue, Isolation and withdrawal, Concentration impairments, Somatic complaints, Feelings of worthlessness, hopelessness and Distractible  Mental Status Exam:  Appearance:   Casual     Behavior:  Appropriate and Sharing  Motor:  Normal  Speech/Language:   Normal Rate  Affect:  Depressed  Mood:  anxious, depressed and sad  Thought process:  normal  Thought content:    WNL  Sensory/Perceptual disturbances:    WNL  Orientation:  oriented to person, place, time/date, situation, day of week, month of year and year  Attention:  Good  Concentration:  Fair  Memory:  Immediate;   Fair Recent;   [place] of knowledge:   Good  Insight:    Good  Judgment:   Good  Impulse Control:  Good   Risk Assessment: Danger to Self:  No Self-injurious Behavior: No Danger to Others: No Duty to Warn:no Physical Aggression / Violence:No  Access to Firearms a concern: No  Gang Involvement:No   Subjective:   Patient feels she has progressed some since her recent first appt. Reports multiple symptoms as noted above.  Did follow through on the positive thinking strategies versus excessive worrying and has had some success with that. More frequent symptoms are the depression and anxiety.  Still fearful of taking medications. Does well for a bit with her improved thinking and then when something negative happens, that trips me up and I'm back to where I was being negative.  Not  Happy with her job and is considering other options.  Hard for her to hang onto positive thoughts/plans.  Reports pain all over body but doctors have not diagnosed anything.    Interventions: Cognitive Behavioral Therapy and Solution-Oriented/Positive  Psychology  Diagnosis:   ICD-10-CM   1. Generalized anxiety disorder F41.1     Plan:  Patient to work on strategies for managing anxiety and depression discussed in session. Be aware of what she can control and what is not in her control.  Decrease her worrying and looking for what may go wrong versus right.  Choose to interrupt negative thought patterns.  Stay "in the present" and not jump ahead, discerning what are just thoughts and what are "facts".    Shanon Ace, LCSW

## 2018-08-01 NOTE — Telephone Encounter (Signed)
done

## 2018-08-04 ENCOUNTER — Encounter: Payer: Self-pay | Admitting: Internal Medicine

## 2018-08-08 ENCOUNTER — Ambulatory Visit: Payer: 59 | Admitting: Physician Assistant

## 2018-08-08 ENCOUNTER — Other Ambulatory Visit: Payer: Self-pay

## 2018-08-08 ENCOUNTER — Emergency Department (HOSPITAL_BASED_OUTPATIENT_CLINIC_OR_DEPARTMENT_OTHER)
Admission: EM | Admit: 2018-08-08 | Discharge: 2018-08-08 | Disposition: A | Discharge status: Home / Self Care | Payer: 59 | Attending: Emergency Medicine | Admitting: Emergency Medicine

## 2018-08-08 ENCOUNTER — Emergency Department (HOSPITAL_BASED_OUTPATIENT_CLINIC_OR_DEPARTMENT_OTHER): Payer: 59

## 2018-08-08 ENCOUNTER — Encounter (HOSPITAL_BASED_OUTPATIENT_CLINIC_OR_DEPARTMENT_OTHER): Payer: Self-pay | Admitting: Emergency Medicine

## 2018-08-08 DIAGNOSIS — Z79899 Other long term (current) drug therapy: Secondary | ICD-10-CM | POA: Insufficient documentation

## 2018-08-08 DIAGNOSIS — R110 Nausea: Secondary | ICD-10-CM | POA: Diagnosis not present

## 2018-08-08 DIAGNOSIS — R109 Unspecified abdominal pain: Secondary | ICD-10-CM | POA: Diagnosis present

## 2018-08-08 DIAGNOSIS — R1031 Right lower quadrant pain: Secondary | ICD-10-CM

## 2018-08-08 LAB — CBC
HCT: 42.1 % (ref 36.0–46.0)
Hemoglobin: 12.9 g/dL (ref 12.0–15.0)
MCH: 28.2 pg (ref 26.0–34.0)
MCHC: 30.6 g/dL (ref 30.0–36.0)
MCV: 92.1 fL (ref 80.0–100.0)
Platelets: 337 10*3/uL (ref 150–400)
RBC: 4.57 MIL/uL (ref 3.87–5.11)
RDW: 12.5 % (ref 11.5–15.5)
WBC: 11.7 10*3/uL — ABNORMAL HIGH (ref 4.0–10.5)
nRBC: 0 % (ref 0.0–0.2)

## 2018-08-08 LAB — URINALYSIS, ROUTINE W REFLEX MICROSCOPIC
BILIRUBIN URINE: NEGATIVE
GLUCOSE, UA: NEGATIVE mg/dL
Ketones, ur: NEGATIVE mg/dL
Leukocytes, UA: NEGATIVE
Nitrite: NEGATIVE
Protein, ur: NEGATIVE mg/dL
SPECIFIC GRAVITY, URINE: 1.025 (ref 1.005–1.030)
pH: 6 (ref 5.0–8.0)

## 2018-08-08 LAB — COMPREHENSIVE METABOLIC PANEL
ALBUMIN: 4.1 g/dL (ref 3.5–5.0)
ALT: 14 U/L (ref 0–44)
AST: 11 U/L — AB (ref 15–41)
Alkaline Phosphatase: 55 U/L (ref 38–126)
Anion gap: 11 (ref 5–15)
BUN: 15 mg/dL (ref 6–20)
CO2: 25 mmol/L (ref 22–32)
Calcium: 9.3 mg/dL (ref 8.9–10.3)
Chloride: 105 mmol/L (ref 98–111)
Creatinine, Ser: 0.73 mg/dL (ref 0.44–1.00)
GFR calc Af Amer: >60 mL/min (ref >60)
GFR calc non Af Amer: >60 mL/min (ref >60)
Glucose, Bld: 117 mg/dL — ABNORMAL HIGH (ref 70–99)
Potassium: 3.5 mmol/L (ref 3.5–5.1)
Sodium: 141 mmol/L (ref 135–145)
Total Bilirubin: 0.4 mg/dL (ref 0.3–1.2)
Total Protein: 7.5 g/dL (ref 6.5–8.1)

## 2018-08-08 LAB — URINALYSIS, MICROSCOPIC (REFLEX)

## 2018-08-08 LAB — WET PREP, GENITAL
Clue Cells Wet Prep HPF POC: NONE SEEN
Sperm: NONE SEEN
TRICH WET PREP: NONE SEEN
Yeast Wet Prep HPF POC: NONE SEEN

## 2018-08-08 LAB — LIPASE, BLOOD: Lipase: 40 U/L (ref 11–51)

## 2018-08-08 LAB — PREGNANCY, URINE: Preg Test, Ur: NEGATIVE

## 2018-08-08 MED ORDER — SODIUM CHLORIDE 0.9 % IV BOLUS
1000.0000 mL | Freq: Once | INTRAVENOUS | Status: AC
  Administered 2018-08-08: 1000 mL via INTRAVENOUS

## 2018-08-08 MED ORDER — HYDROMORPHONE HCL 1 MG/ML IJ SOLN
1.0000 mg | Freq: Once | INTRAMUSCULAR | Status: AC
  Administered 2018-08-08: 1 mg via INTRAVENOUS
  Filled 2018-08-08: qty 1

## 2018-08-08 MED ORDER — HYDROCODONE-ACETAMINOPHEN 5-325 MG PO TABS: 1.0000 | ORAL_TABLET | ORAL | 0 refills | Status: DC | PRN

## 2018-08-08 MED ORDER — ONDANSETRON HCL 4 MG/2ML IJ SOLN
4.0000 mg | Freq: Once | INTRAMUSCULAR | Status: AC
  Administered 2018-08-08: 4 mg via INTRAVENOUS
  Filled 2018-08-08: qty 2

## 2018-08-08 MED ORDER — MORPHINE SULFATE (PF) 4 MG/ML IV SOLN: 4.0000 mg | Freq: Once | INTRAVENOUS | Status: DC

## 2018-08-08 MED ORDER — MORPHINE SULFATE (PF) 2 MG/ML IV SOLN
2.0000 mg | Freq: Once | INTRAVENOUS | Status: AC
  Administered 2018-08-08: 2 mg via INTRAVENOUS

## 2018-08-08 MED ORDER — PROMETHAZINE HCL 25 MG PO TABS
25.0000 mg | ORAL_TABLET | Freq: Once | ORAL | Status: AC
  Administered 2018-08-08: 25 mg via ORAL
  Filled 2018-08-08: qty 1

## 2018-08-08 MED ORDER — MORPHINE SULFATE (PF) 4 MG/ML IV SOLN
4.0000 mg | Freq: Once | INTRAVENOUS | Status: DC
  Filled 2018-08-08: qty 1

## 2018-08-08 MED ORDER — PROMETHAZINE HCL 25 MG/ML IJ SOLN
25.0000 mg | Freq: Once | INTRAMUSCULAR | Status: AC
  Administered 2018-08-08: 25 mg via INTRAVENOUS
  Filled 2018-08-08: qty 1

## 2018-08-08 MED ORDER — HYDROCODONE-ACETAMINOPHEN 5-325 MG PO TABS
1.0000 | ORAL_TABLET | Freq: Once | ORAL | Status: DC
  Filled 2018-08-08: qty 1

## 2018-08-08 MED ORDER — PROMETHAZINE HCL 25 MG PO TABS: 25.0000 mg | ORAL_TABLET | Freq: Four times a day (QID) | ORAL | 0 refills | Status: DC | PRN

## 2018-08-08 NOTE — ED Triage Notes (Signed)
Reports RLQ abdominal which began yesterday.  Denies N/V/D, fevers.

## 2018-08-08 NOTE — ED Provider Notes (Signed)
[place] EMERGENCY DEPARTMENT Provider Note   CSN: [id number] Arrival date & time: 08/08/18  2831     History   Chief Complaint Chief Complaint  Patient presents with  . Abdominal Pain    HPI Brandy [name] is a 32 y.o. female.  The history is provided by the patient and medical records. No language interpreter was used.  Abdominal Pain   This is a recurrent problem. The current episode started more than 1 week ago. The problem occurs constantly. The problem has been rapidly worsening. The pain is associated with an unknown factor. The pain is located in the RLQ. The quality of the pain is aching and sharp. The pain is severe. Associated symptoms include nausea. Pertinent negatives include anorexia, fever, diarrhea, vomiting, constipation, dysuria, frequency and headaches. The symptoms are aggravated by palpation. Nothing relieves the symptoms. Past workup includes CT scan and ultrasound. Her past medical history is significant for gallstones.    Past Medical History:  Diagnosis Date  . Anxiety   . Depression   . Gallstones   . History of nephrolithiasis   . Kidney stone   . Migraine   . Pituitary tumor   . Vitamin D deficiency     Patient Active Problem List   Diagnosis Date Noted  . Dysphagia 07/20/2018  . Anxiety associated with birthing process 06/14/2018  . 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 Chronic mixed headache syndrome 06/14/2018  . Medication overuse headache 06/14/2018  . Headache, unspecified headache type 03/05/2018  . SVD (spontaneous vaginal delivery) 02/02/2018  . Postpartum care following vaginal delivery 02/02/2018  . Headache in pregnancy, third trimester 01/31/2018  . Class 1 obesity with body mass index (BMI) of 31.0 to 31.9 in adult 05/04/2017  . Nausea without vomiting 02/18/2017  . Abdominal pain, epigastric 02/18/2017  . Vitamin D deficiency 07/21/2016  . B12 deficiency 07/21/2016  . Generalized anxiety disorder  06/09/2016  . Anterior neck pain 12/30/2015  . Enlarged thyroid gland 12/30/2015  . Fatigue 12/30/2015  . Routine general medical examination at a health care facility 09/27/2014  . Tendinitis of right ankle 08/14/2014  . Nausea vomiting and diarrhea 12/28/2013  . Serum calcium elevated 12/28/2013  . 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 DUB (dysfunctional uterine bleeding) 06/04/2011  . GERD (gastroesophageal reflux disease) 02/19/2011  . Depression 09/06/2008  . NEPHROLITHIASIS, HX OF 12/16/2007    Past Surgical History:  Procedure Laterality Date  . CHOLECYSTECTOMY  2014  . KIDNEY STONE SURGERY     x 5  . MOUTH SURGERY    . TYMPANOSTOMY TUBE PLACEMENT       OB History    Gravida  3   Para  1   Term  1   Preterm  0   AB  1   Living  1     SAB  1   TAB  0   Ectopic  0   Multiple  0   Live Births  1        Obstetric Comments  Age first period 89         Home Medications    Prior to Admission medications   Medication Sig Start Date End Date Taking? Authorizing Provider  famotidine (PEPCID) 20 MG tablet Take 80 mg by mouth daily.   Yes [provider]  acetaminophen (TYLENOL) 500 MG tablet Take 1,000 mg by mouth every 6 (six) hours as needed for headache.    [provider]  Cholecalciferol (VITAMIN D) 2000  units CAPS Take 2,000 Units by mouth daily.    [provider]  Coenzyme Q10 (CO Q-10 PO) Take by mouth daily.    [provider]  LORazepam (ATIVAN) 1 MG tablet Take 1 tablet (1 mg total) by mouth every 6 (six) hours as needed for anxiety. 06/30/18   Addison Lank, PA-C  MAGNESIUM PO Take by mouth daily.    [provider]  mirtazapine (REMERON) 7.5 MG tablet Take 7.5 mg by mouth at bedtime. 07/13/18   [provider]  Omega-3 Fatty Acids (FISH OIL PO) Take by mouth daily.    [provider]  ondansetron (ZOFRAN ODT) 8 MG disintegrating tablet Take 1 tablet (8 mg total) by mouth every 8  (eight) hours as needed for nausea or vomiting. 05/04/18   Gatha Mayer, MD    Family History Family History  Problem Relation Age of Onset  . Diabetes Mother   . Mental illness Mother   . Asthma Mother   . Stomach cancer Mother   . Colon polyps Mother   . Heart disease Mother   . Hypertension Mother   . Arthritis Mother   . Diabetes Father   . Hypertension Father   . Anxiety disorder Sister   . Colon cancer Maternal Grandmother   . Diabetes Paternal Grandmother   . Breast cancer Maternal Aunt   . Lung cancer Maternal Aunt   . Diabetes Paternal Aunt   . Diabetes Maternal Uncle   . Diabetes Maternal Uncle     Social History Social History   Tobacco Use  . Smoking status: Never Smoker  . Smokeless tobacco: Never Used  Substance Use Topics  . Alcohol use: Not Currently    Frequency: Never  . Drug use: No     Allergies   Metoclopramide; Penicillins; Erenumab-aooe; Luvox [fluvoxamine]; Sumatriptan; and Zoloft [sertraline hcl]   Review of Systems Review of Systems  Constitutional: Negative for chills, diaphoresis, fatigue and fever.  HENT: Negative for congestion.   Respiratory: Negative for cough, chest tightness, shortness of breath and wheezing.   Cardiovascular: Negative for chest pain and palpitations.  Gastrointestinal: Positive for abdominal pain and nausea. Negative for abdominal distention, anorexia, constipation, diarrhea and vomiting.  Genitourinary: Negative for dysuria, frequency, vaginal bleeding, vaginal discharge and vaginal pain.  Musculoskeletal: Negative for back pain, neck pain and neck stiffness.  Skin: Negative for rash and wound.  Neurological: Negative for light-headedness and headaches.  Psychiatric/Behavioral: Negative for agitation and confusion.  All other systems reviewed and are negative.    Physical Exam Updated Vital Signs BP 103/73 (BP Location: Right Arm)   Pulse 80   Temp 98 F (36.7 C) (Oral)   Resp 20   Ht 5' 8.5" (1.74  m)   Wt 72.6 kg   LMP 07/24/2018 (Approximate)   SpO2 100%   Breastfeeding? No   BMI 23.97 kg/m   Physical Exam  Constitutional: She appears well-developed and well-nourished.  Non-toxic appearance. She does not appear ill. No distress.  HENT:  Head: Normocephalic and atraumatic.  Eyes: Pupils are equal, round, and reactive to light. Conjunctivae are normal.  Neck: Neck supple.  Cardiovascular: Normal rate, regular rhythm and normal heart sounds.  No murmur heard. Pulmonary/Chest: Effort normal and breath sounds normal. No respiratory distress.  Abdominal: Soft. Normal appearance and bowel sounds are normal. There is tenderness in the right lower quadrant. There is no rigidity, no rebound, no guarding and no CVA tenderness.    Genitourinary: Pelvic exam  was performed with patient supine. There is no tenderness on the right labia. There is no tenderness on the left labia. Uterus is not tender. Cervix exhibits no motion tenderness, no discharge and no friability. Right adnexum displays no tenderness. Left adnexum displays no tenderness. No tenderness in the vagina.  Musculoskeletal: She exhibits no edema.  Neurological: She is alert.  Skin: Skin is warm and dry.  Psychiatric: She has a normal mood and affect.  Nursing note and vitals reviewed.    ED Treatments / Results  Labs (all labs ordered are listed, but only abnormal results are displayed) Labs Reviewed  WET PREP, GENITAL - Abnormal; Notable for the following components:      Result Value   WBC, Wet Prep HPF POC FEW (*)    All other components within normal limits  COMPREHENSIVE METABOLIC PANEL - Abnormal; Notable for the following components:   Glucose, Bld 117 (*)    AST 11 (*)    All other components within normal limits  CBC - Abnormal; Notable for the following components:   WBC 11.7 (*)    All other components within normal limits  URINALYSIS, ROUTINE W REFLEX MICROSCOPIC - Abnormal; Notable for the following  components:   Hgb urine dipstick TRACE (*)    All other components within normal limits  URINALYSIS, MICROSCOPIC (REFLEX) - Abnormal; Notable for the following components:   Bacteria, UA RARE (*)    All other components within normal limits  LIPASE, BLOOD  PREGNANCY, URINE  GC/CHLAMYDIA PROBE AMP ([place]) NOT AT [facility]    EKG None  Radiology Ct Abdomen Pelvis Wo Contrast  Result Date: 08/08/2018 CLINICAL DATA:  Acute right lower quadrant abdominal pain. EXAM: CT ABDOMEN AND PELVIS WITHOUT CONTRAST TECHNIQUE: Multidetector CT imaging of the abdomen and pelvis was performed following the standard protocol without IV contrast. COMPARISON:  CT scan of April 20, 2018. FINDINGS: Lower chest: No acute abnormality. Hepatobiliary: No focal liver abnormality is seen. Status post cholecystectomy. No biliary dilatation. Pancreas: Unremarkable. No pancreatic ductal dilatation or surrounding inflammatory changes. Spleen: Normal in size without focal abnormality. Adrenals/Urinary Tract: Adrenal glands appear normal. Right nephrolithiasis is noted. Minimal left hydronephrosis is noted secondary to 4 mm calculus at the left ureteropelvic junction. Urinary bladder is unremarkable. Stomach/Bowel: Stomach is within normal limits. Appendix appears normal. No evidence of bowel wall thickening, distention, or inflammatory changes. Vascular/Lymphatic: No significant vascular findings are present. No enlarged abdominal or pelvic lymph nodes. Reproductive: Uterus and bilateral adnexa are unremarkable. Other: No abdominal wall hernia or abnormality. No abdominopelvic ascites. Musculoskeletal: No acute or significant osseous findings. IMPRESSION: Nonobstructive right nephrolithiasis. Minimal left hydronephrosis secondary to 4 mm calculus at the left ureteropelvic junction. No other abnormality seen in the abdomen or pelvis. Electronically Signed   By: Marijo Conception, M.D.   On: 08/08/2018 11:34     Procedures Procedures (including critical care time)  Medications Ordered in ED Medications  HYDROcodone-acetaminophen (NORCO/VICODIN) 5-325 MG per tablet 1 tablet (1 tablet Oral Refused 08/08/18 1351)  ondansetron (ZOFRAN) injection 4 mg (4 mg Intravenous Given 08/08/18 0823)  sodium chloride 0.9 % bolus 1,000 mL ( Intravenous Stopped 08/08/18 0924)  morphine 2 MG/ML injection 2 mg (2 mg Intravenous Given 08/08/18 0829)  HYDROmorphone (DILAUDID) injection 1 mg (1 mg Intravenous Given 08/08/18 0855)  ondansetron (ZOFRAN) injection 4 mg (4 mg Intravenous Given 08/08/18 1007)  promethazine (PHENERGAN) injection 25 mg (25 mg Intravenous Given 08/08/18 1108)  promethazine (PHENERGAN) tablet 25 mg (25  mg Oral Given 08/08/18 1351)     Initial Impression / Assessment and Plan / ED Course  I have reviewed the triage vital signs and the nursing notes.  Pertinent labs & imaging results that were available during my care of the patient were reviewed by me and considered in my medical decision making (see chart for details).     Brandy [name] is a 32 y.o. female with a past medical history significant for prior pituitary tumor, numerous kidney stones, gallstones status post cholecystectomy, chronic epigastric pain, depression, and anxiety who presents with severe right lower quadrant abdominal pain.  Patient reports that for the last few weeks she is been having worsening abdominal pain acutely worsening over the last few days.  She says that several weeks ago she was seen at another emergency department who did a CT scan showing concern for possible early acute appendicitis on CT.  At that time, her appendix was at the upper limit of normal in size with adjacent fat stranding.  There was also a dilated fluid-filled right adnexa suggested fluid in the fallopian tube.  Transvaginal ultrasound did not show evidence of torsion or other concerning findings.  Patient says that she was discharged home and has  continued to have symptoms.  She says that over the last 2 days it acutely worsened and is now severe.  It is in the right lower quadrant after migrating from the umbilicus and epigastrium.  She reports she has had nausea but no vomiting.  She has had no significant constipation or diarrhea.  She has had no trauma.  She reports no significant fevers, chills, chest pain, shortness of breath, or other acute changes.  On exam, patient has tenderness in her right lower quadrant.  Less pain or tenderness in the epigastrium.  No CVA tenderness or flank tenderness.  Lungs clear and chest is nontender.  A long decision making conversation was held with patient as to best work-up strategy.  Due to the abnormalities on  recent imaging and her adnexa and her appendix, we will have work-up to look for both.  We will do a pelvic exam first to determine if she is having adnexal tenderness.  If this is normal, will go to CT scan initially.  If she is having tenderness in the adnexa, will do pelvic ultrasound first.  Either way, anticipate patient will require CT imaging to look for appendicitis given the abnormalities on previous exam and the migration of pain to her right lower quadrant exclusively.  Patient does report that she had a very poor reaction to IV contrast during her last CT scan and would rather not do that that today.  She is agreeable to oral contrast if needed.  Patient was given pain medicine nausea medicine and fluids initially.  She will be made n.p.o.  Anticipate reassessment after work-up.  8:52 AM Pelvic exam was performed and swabs were collected.  Patient had no tenderness on her cervix or adnexa.  Low suspicion for torsion or pelvic cause of the symptoms.  Patient will have CT scan with oral contrast but no IV contrast to further evaluate for developed appendicitis.  Patient had diagnostic laboratory testing.  Patient had no evidence of urinary tract infection with no nitrites or leukocytes.   Wet prep reassuring.  Pregnancy test negative.  CMP was reassuring.  CBC shows mild leukocytosis but no anemia.  CT scan showed no acute cause of her symptoms.  Specifically, there was no evidence of appendicitis, adnexal abnormality,  diverticulitis, perforation, or other significant abnormality.  Patient had her pain somewhat improved with pain and nausea medicine.  Patient was able to eat and drink.  We discussed obtaining pelvic ultrasound however given her lack of tenderness, reassuring exam recently, she did not want this redone.  Patient will follow-up with her primary doctor and will use the medications to help with symptoms.  Patient understood return precautions for any new or worsened symptoms.  She no other questions or concerns and patient was discharged in good condition.   Final Clinical Impressions(s) / ED Diagnoses   Final diagnoses:  Right lower quadrant abdominal pain  Nausea    ED Discharge Orders         Ordered    promethazine (PHENERGAN) 25 MG tablet  Every 6 hours PRN     08/08/18 1431    HYDROcodone-acetaminophen (NORCO/VICODIN) 5-325 MG tablet  Every 4 hours PRN     08/08/18 1431          Clinical Impression: 1. Right lower quadrant abdominal pain   2. Nausea     Disposition: Discharge  Condition: Good  I have discussed the results, Dx and Tx plan with the pt(& family if present). He/she/they expressed understanding and agree(s) with the plan. Discharge instructions discussed at great length. Strict return precautions discussed and pt &/or family have verbalized understanding of the instructions. No further questions at time of discharge.    Discharge Medication List as of 08/08/2018  2:32 PM    START taking these medications   Details  HYDROcodone-acetaminophen (NORCO/VICODIN) 5-325 MG tablet Take 1 tablet by mouth every 4 (four) hours as needed., Starting Mon 08/08/2018, Print    promethazine (PHENERGAN) 25 MG tablet Take 1 tablet (25 mg total) by  mouth every 6 (six) hours as needed for nausea or vomiting., Starting Mon 08/08/2018, Print        Follow Up: [place], Betty G, MD [place] [address] [phone]     [place] [address] [id number] [place] [address] [phone]       Tegeler, Gwenyth Allegra, MD 08/08/18 1540

## 2018-08-08 NOTE — Discharge Instructions (Signed)
Your CT imaging today was overall reassuring.  We did see the evidence of kidney stone on the left side but no evidence of severe obstruction.  Specifically, there is no evidence of appendicitis, diverticulitis, ovarian abnormality, torsion, perforation, abscess, or any other surgical abnormality.  Is unclear what caused your worsened abdominal pain however there was a moderate amount of stool in this location.  Please try and stay hydrated and use the nausea medicine to help with this.  Please use the pain medicine to up with your discomfort.  Please follow-up with your primary doctor and your gastroenterologist for further management.  If any symptoms change or worsen, please return to the nearest emergency department.

## 2018-08-09 ENCOUNTER — Telehealth: Payer: Self-pay | Admitting: Internal Medicine

## 2018-08-09 LAB — GC/CHLAMYDIA PROBE AMP (~~LOC~~) NOT AT ARMC
Chlamydia: NEGATIVE
Neisseria Gonorrhea: NEGATIVE

## 2018-08-10 ENCOUNTER — Telehealth: Payer: Self-pay | Admitting: Cardiology

## 2018-08-10 ENCOUNTER — Encounter: Payer: 59 | Admitting: Internal Medicine

## 2018-08-10 DIAGNOSIS — R002 Palpitations: Secondary | ICD-10-CM

## 2018-08-10 NOTE — Telephone Encounter (Signed)
  Patient called to set up appt for event monitor that was discussed with Dr Harrell Gave at last appointment but there is no order for it.

## 2018-08-10 NOTE — Telephone Encounter (Signed)
Patient was seen by Dr. Harrell Gave on 07/14/18 and 48 hour holter monitor was placed on 07/15/18. It looks like you were never able to reach the patient to discuss results. Please contact patient. Thanks!

## 2018-08-12 NOTE — Telephone Encounter (Signed)
Spoke with pt and informed that we have been trying to get in contact with her about monitor results. Pt verbalized that she received the results that was mailed and wants to have 14 day ZIO placed as Dr. Harrell Gave recommended. Orders placed and message sent to scheduler to set up a time.

## 2018-08-17 ENCOUNTER — Telehealth: Payer: Self-pay | Admitting: Gastroenterology

## 2018-08-17 NOTE — Telephone Encounter (Signed)
The pt says she feels full hours after eating, has epigastric pain.  Nausea first thing in the morning.  The pt was scheduled for EGD 12/11 with Dr Carlean Purl but cancelled and states she wants GES before an EGD.  Please advise

## 2018-08-18 ENCOUNTER — Ambulatory Visit: Payer: 59

## 2018-08-18 DIAGNOSIS — R002 Palpitations: Secondary | ICD-10-CM

## 2018-08-18 NOTE — Telephone Encounter (Signed)
Says she did not get a call back yesterday at all and is hurting a lot. Doesn't know what to do.

## 2018-08-22 MED ORDER — SUCRALFATE 1 G PO TABS: 1.0000 g | ORAL_TABLET | Freq: Three times a day (TID) | ORAL | 0 refills | Status: DC

## 2018-08-22 NOTE — Telephone Encounter (Signed)
carafate sent to the pharmacy and she has been scheduled for EGD on 09/01/18

## 2018-08-22 NOTE — Telephone Encounter (Signed)
Oh yes, I believe that I wrote that in my note.  We can try Carafate tablet ACHS for 30 days.  Is she agreeable to EGD?

## 2018-08-22 NOTE — Telephone Encounter (Signed)
I spoke with the pt and she states that she is unable to take PPI's, they cause worse stomach pain.  She wants to know if she can have something that coats her stomach. Please advise

## 2018-08-22 NOTE — Telephone Encounter (Signed)
Typically we proceed with EGD first to rule out structural/mechanical causes of symptoms before proceeding with GES.  As we discussed at her visit there are not a lot of options for gastroparesis that are any different than what we use to treat ulcers, gastritis, GERD, etc.  Has she been taking her pepcid BID?  If so and it is not working then let's try pantoprazole 40 mg daily.  Please check to see if she is breast-feeding because she has an infant if so then please just have her make the pediatrician aware about the medication change but it is ok to use while breastfeeding.

## 2018-08-26 ENCOUNTER — Other Ambulatory Visit: Payer: Self-pay

## 2018-08-26 ENCOUNTER — Telehealth: Payer: Self-pay | Admitting: Internal Medicine

## 2018-08-26 MED ORDER — DULOXETINE HCL 20 MG PO CPEP: 20.0000 mg | ORAL_CAPSULE | Freq: Every day | ORAL | 0 refills | Status: DC

## 2018-08-26 NOTE — Telephone Encounter (Signed)
Discussed with pt that she can dissolve the tab in a tablespoon of warm water and turn it into a slurry and then drink it as script states. Pt will try this.

## 2018-08-26 NOTE — Telephone Encounter (Signed)
Pt stated that she is having difficulty swallowing the pills and if she can have the liquid ordered instead?

## 2018-08-29 ENCOUNTER — Ambulatory Visit: Payer: 59 | Admitting: Physician Assistant

## 2018-09-01 ENCOUNTER — Encounter: Payer: 59 | Admitting: Internal Medicine

## 2018-09-05 ENCOUNTER — Ambulatory Visit (INDEPENDENT_AMBULATORY_CARE_PROVIDER_SITE_OTHER): Payer: 59 | Admitting: Cardiology

## 2018-09-05 ENCOUNTER — Encounter: Payer: Self-pay | Admitting: Cardiology

## 2018-09-05 VITALS — BP 122/68 | HR 88 | Ht 68.5 in | Wt 185.1 lb

## 2018-09-05 DIAGNOSIS — R0789 Other chest pain: Secondary | ICD-10-CM

## 2018-09-05 DIAGNOSIS — R002 Palpitations: Secondary | ICD-10-CM | POA: Diagnosis not present

## 2018-09-05 NOTE — Progress Notes (Signed)
Cardiology Office Note:    Date:  09/05/2018   ID:  Brandy [name], DOB 06-21-1986, MRN [id number]  PCP:  Martinique, Betty G, MD  Cardiologist:  Brandy Dresser, MD PhD  Referring MD: Martinique, Betty G, MD   CC: follow up  History of Present Illness:    Brandy [name] is a 33 y.o. female with a hx of anxiety/postpartum depression who is seen in follow up for the evaluation and management of palpitations, shortness of breath. Her initial consult was on 05/31/18.   Cardiac history: Has a baby summer 2019. Has been having palpitations and shortness of breath since then. Has noted that her heart rate has jumped up 30-40 beats when exerting herself or moving around. When she exerts herself to a minimal amount, she feels like she might pass out, gets sweaty. Had noted high blood pressure, now back to her normal. Occurs even with walking a short distance. 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 Since she has had the baby, has been struggling. Lots of headaches. Minimal activity, though she is back to work. Heart rate goes up, feels short of breath, diaphoretic. Sharp back pain with movement, dull chest pain with movement or deep breathing.  She has been seen in the ER for this, as well as by Dr. Nehemiah Massed at [place]. Workup thus far has been unrevealing. Normal echo in Care Everywhere. Normal treadmill stress test. No significant arrhythmia seen on holter monitor. ECG from 8.21.19 was sinus tach at 147 bpm. TSH is normal.  Had full syncope about 2 mos ago, had very stressful event (mom thought she was having heart attack) and had just started a new antidepressant. No syncope more recently. No PND, orthopnea, LE edema.   Follow up today: She continues to feel poorly overall. We reviewed her monitor report in detail today, showing no evidence of significant cardiac abnormalities. There was no abnormal rhythm that lined up with her chest  discomfort/palpitations. She is understandably frustrated, as she is in discomfort and pain without a clear etiology. She has had to leave her job because of her symptoms. She is pending EGD but nervous about sedation. We discussed many options, including a pain specialist referral. Her rheumatology workup was unremarkable, and she doesn't have classic fibromyalgia exam, but I don't know if there is someone else who could assist with her symptoms. I'll ask my colleagues, but in the meantime expressed my support.   Past Medical History:  Diagnosis Date  . Anxiety   . Depression   . Gallstones   . History of nephrolithiasis   . Kidney stone   . Migraine   . Pituitary tumor   . Vitamin D deficiency     Past Surgical History:  Procedure Laterality Date  . CHOLECYSTECTOMY  2014  . KIDNEY STONE SURGERY     x 5  . MOUTH SURGERY    . TYMPANOSTOMY TUBE PLACEMENT      Current Medications: Current Outpatient Medications on File Prior to Visit  Medication Sig  . acetaminophen (TYLENOL) 500 MG tablet Take 1,000 mg by mouth every 6 (six) hours as needed for headache.  . Cholecalciferol (VITAMIN D) 2000 units CAPS Take 2,000 Units by mouth daily.  . famotidine (PEPCID) 20 MG tablet Take 80 mg by mouth daily.  Marland Kitchen LORazepam (ATIVAN) 1 MG tablet Take 1 tablet (1 mg total) by mouth every 6 (six) hours as needed for anxiety.  Marland Kitchen MAGNESIUM PO  Take by mouth daily.  . Omega-3 Fatty Acids (FISH OIL PO) Take by mouth daily.  . ondansetron (ZOFRAN ODT) 8 MG disintegrating tablet Take 1 tablet (8 mg total) by mouth every 8 (eight) hours as needed for nausea or vomiting.   No current facility-administered medications on file prior to visit.      Allergies:   Metoclopramide; Penicillins; Erenumab-aooe; Luvox [fluvoxamine]; Sumatriptan; and Zoloft [sertraline hcl]   Social History   Socioeconomic History  . Marital status: Married    Spouse name: Jonathon  . Number of children: 1  . Years of education:  Not on file  . Highest education level: Bachelor's degree (e.[name]., BA, AB, BS)  Occupational History  . 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 Financial resource strain: Not on file  . Food insecurity:    Worry: Not on file    Inability: Not on file  . Transportation needs:    Medical: Not on file    Non-medical: Not on file  Tobacco Use  . Smoking status: Never Smoker  . Smokeless tobacco: Never Used  Substance and Sexual Activity  . Alcohol use: Not Currently    Frequency: Never  . Drug use: No  . Sexual activity: Yes  Lifestyle  . Physical activity:    Days per week: Not on file    Minutes per session: Not on file  . Stress: Not on file  Relationships  . Social connections:    Talks on phone: Not on file    Gets together: Not on file    Attends religious service: Not on file    Active member of club or organization: Not on file    Attends meetings of clubs or organizations: Not on file    Relationship status: Not on file  Other Topics Concern  . Not on file  Social History Narrative   Patient is right-handed. She lives with her husband and child in a split-level home.    Daughter born 01/2018   She avoids caffeine.    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[employer]     Family History: The patient's family history includes Anxiety disorder in her sister; Arthritis in her mother; Asthma in her mother; Breast cancer in her maternal aunt; Colon cancer in her maternal grandmother; Colon polyps in her mother; Diabetes in her father, maternal uncle, maternal uncle, mother, paternal aunt, and paternal grandmother; Heart disease in her mother; Hypertension in her father and mother; Lung cancer in her maternal aunt; Mental illness in her mother; Stomach cancer in her mother. Mom with CAD (50% left main), aortic atherosclerosis, heart failure. Has had 40+ surgeries, 150 kidney stones, multiple back issues, GI bleeding,  diabetes, HF. Uncle had MI age 80, had arrhythmia. Gparents with heart failure. Father is diabete, type II, but very thin, frequent emesis.  ROS:   Please see the history of present illness.  Additional pertinent ROS:  Constitutional: Negative for chills, fever, night sweats HENT: Negative for ear pain and hearing loss.   Eyes: Negative for loss of vision and eye pain.  Respiratory: Negative for cough, sputum, shortness of breath, wheezing.   Cardiovascular: Positive for chest tightness, palpitations. Negative for PND, orthopnea, lower extremity edema and claudication.  Gastrointestinal: Negative for abdominal pain, melena, and hematochezia.  Genitourinary: Negative for dysuria and hematuria.  Musculoskeletal: Negative for falls and myalgias.  Skin: Negative for itching and rash.  Neurological:  Negative for focal weakness, focal sensory changes and loss of consciousness. Positive for chronic headaches. Positive for diffuse pain. Endo/Heme/Allergies: Does not bruise/bleed easily.   EKGs/Labs/Other Studies Reviewed:    The following studies were reviewed today: Echo, Holter, treadmill stress reports reviewed in Care Everywhere. Prior notes reviewed.  Monitor 07/15/18 48 hour holter monitor reviewed. No serious events. No triggered/diary events. Predominant rhythm is sinus arrhythmia, and there was only one PAC. No ventricular ectopy, no pauses. Minimum heart rate 42 bpm, max heart rate 147 bpm.  Average heart rate 82 bpm. Heart rate dropped appropriately with sleep.   Monitor 08/18/18 ~6 days of data recorded on Zio monitor. No VT, SVT, atrial fibrillation, high degree block, or pauses noted. Isolated atrial and ventricular ectopy was rare (<1%). Patient had a min HR of 47 bpm, max HR of 174 bpm, and avg HR of 86 bpm. Predominant underlying rhythm was Sinus Rhythm.  There were 15 patient triggered events. 12 were normal sinus rhythm, 1 was normal sinus rhythm with PVC, 1 was sinus  tachycardia with PVC, and 1 was sinus rhythm with PAC.    EKG:  EKG is personally reviewed.  The ekg ordered previously demonstrates normal sinus rhythm with sinus arrhythmia.  Recent Labs: 03/25/2018: B Natriuretic Peptide 15.0 06/14/2018: TSH 0.58 08/08/2018: ALT 14; BUN 15; Creatinine, Ser 0.73; Hemoglobin 12.9; Platelets 337; Potassium 3.5; Sodium 141  Recent Lipid Panel    Component Value Date/Time   CHOL 196 05/31/2018 1023   TRIG 76 05/31/2018 1023   HDL 53 05/31/2018 1023   CHOLHDL 3.7 05/31/2018 1023   CHOLHDL 3 09/24/2014 0837   VLDL 9.6 09/24/2014 0837   LDLCALC 128 (H) 05/31/2018 1023    Physical Exam:    VS:  BP 122/68   Pulse 88   Ht 5' 8.5" (1.74 m)   Wt 185 lb 1.3 oz (84 kg)   SpO2 97%   BMI 27.73 kg/m     Wt Readings from Last 3 Encounters:  09/05/18 185 lb 1.3 oz (84 kg)  08/08/18 160 lb (72.6 kg)  07/20/18 190 lb 12.8 oz (86.5 kg)     GEN: Well nourished, well developed in no acute distress HEENT: Normal NECK: No JVD; No carotid bruits LYMPHATICS: No lymphadenopathy CARDIAC: regular rhythm, normal S1 and S2, no murmurs, rubs, gallops. Radial and DP pulses 2+ bilaterally. RESPIRATORY:  Clear to auscultation without rales, wheezing or rhonchi  ABDOMEN: Soft, non-tender, non-distended MUSCULOSKELETAL:  No edema; No deformity  SKIN: Warm and dry NEUROLOGIC:  Alert and oriented x 3 PSYCHIATRIC:  Normal affect   ASSESSMENT:    1. Other chest pain   2. Heart palpitations    PLAN:    1. Heart palpitations, chest pain:   -has already had normal echo, normal treadmill stress, normal holter through Dr. Alveria Apley office at [facility].   -two monitors here (48 hour and 7 day) without any significant arrhythmia  I offered my support to her. She clearly is distressed about how she feels, and she brought her baby today--said repeatedly how much she wish she felt better to be there for her daughter. She is now not able to work because of her symptoms.  She has had negative reactions to nearly every medication tried for her pain, and her workup thus far has been unremarkable.   Her cardiac workup is unremarkable. I do think an EGD would be helpful, and I encouraged her to schedule this as recommended. I do not have any  contacts in the complex pain/pain management area, but I will ask my colleagues to see if they have recommendations. I offered my help if there is anything I can assist with in the future.   Plan for follow up: as needed, at the [facility] office  TIME SPENT WITH PATIENT: 40 minutes of direct patient care. More than 50% of that time was spent on coordination of care and counseling regarding review of her cardiac workup, in depth review of her monitor test results, discussion of next steps and support.  Brandy Dresser, MD, PhD [place]  [facility]   Medication Adjustments/Labs and Tests Ordered: Current medicines are reviewed at length with the patient today.  Concerns regarding medicines are outlined above.  No orders of the defined types were placed in this encounter.  No orders of the defined types were placed in this encounter.   Patient Instructions  Medication Instructions:  Your Physician recommend you continue on your current medication as directed.    If you need a refill on your cardiac medications before your next appointment, please call your pharmacy.   Lab work: None  Testing/Procedures: None  Follow-Up: Your physician recommends that you schedule a follow-up appointment as needed with Dr. Harrell Gave.         Signed, Brandy Dresser, MD PhD 09/05/2018 5:04 PM    [place]

## 2018-09-05 NOTE — Patient Instructions (Signed)
Medication Instructions:  Your Physician recommend you continue on your current medication as directed.    If you need a refill on your cardiac medications before your next appointment, please call your pharmacy.   Lab work: None  Testing/Procedures: None  Follow-Up: Your physician recommends that you schedule a follow-up appointment as needed with Dr. Christopher.     

## 2018-09-12 ENCOUNTER — Other Ambulatory Visit: Payer: Self-pay | Admitting: Gastroenterology

## 2018-09-13 ENCOUNTER — Telehealth: Payer: Self-pay | Admitting: Physician Assistant

## 2018-09-13 ENCOUNTER — Ambulatory Visit: Payer: 59 | Admitting: Psychiatry

## 2018-09-13 MED ORDER — LORAZEPAM 1 MG PO TABS: 1.0000 mg | ORAL_TABLET | Freq: Four times a day (QID) | ORAL | 0 refills | Status: DC | PRN

## 2018-09-13 NOTE — Telephone Encounter (Signed)
Requesting refill lorazepam 1 mg sent to [facility] into pharmacy per request  Last fill 12/11 #60 Last office visit 06/30/2018 Next office visit 09/26/2018

## 2018-09-13 NOTE — Telephone Encounter (Signed)
Needs rx on ativan 1mg  sent to [facility] in [place].

## 2018-09-19 NOTE — Telephone Encounter (Signed)
Refill request for sucralfate tablets.  No showed EGD with Carlean Purl on 09-01-18. Ok to refill? Requesting 120 tablets (30 days)  Thanks.

## 2018-09-20 ENCOUNTER — Telehealth: Payer: Self-pay

## 2018-09-20 NOTE — Telephone Encounter (Signed)
Please find out why she no-showed for her EGD first.  Thank you,  Jess

## 2018-09-20 NOTE — Telephone Encounter (Signed)
Called and LM for pt to call back to discuss why she did not show up for her EGD on 09-01-18 with Dr. Carlean Purl.

## 2018-09-20 NOTE — Telephone Encounter (Signed)
See phone note

## 2018-09-26 ENCOUNTER — Ambulatory Visit: Payer: 59 | Admitting: Physician Assistant

## 2018-10-06 ENCOUNTER — Ambulatory Visit: Payer: 59 | Admitting: Psychiatry

## 2018-10-18 ENCOUNTER — Other Ambulatory Visit: Payer: Self-pay

## 2018-10-18 ENCOUNTER — Telehealth: Payer: Self-pay | Admitting: Physician Assistant

## 2018-10-18 MED ORDER — LORAZEPAM 1 MG PO TABS: 1.0000 mg | ORAL_TABLET | Freq: Four times a day (QID) | ORAL | 0 refills | Status: DC | PRN

## 2018-10-18 NOTE — Telephone Encounter (Signed)
Completed.

## 2018-10-18 NOTE — Telephone Encounter (Signed)
Patient called and said that she is out of ativan 1mg  escribed to the [facility] in [place].She is completely out. Her next appointment is feb 25th.

## 2018-10-18 NOTE — Telephone Encounter (Signed)
Give her 15 pills.  Let her know this is last Rx unless she keeps appt.

## 2018-10-18 NOTE — Progress Notes (Signed)
Pt requesting refill of lorazepam be sent to [facility], pt is out.  Last fill #60 on 09/14/2018 Last OV 06/30/2018  Next OV 10/24/2018  Pt cancelled on 09/26/2018 and 08/29/2018 No Show on 08/08/2018  Provider ok to give pt #15 only and to keep her schedule ov for any further refills.

## 2018-10-24 ENCOUNTER — Ambulatory Visit: Payer: 59 | Admitting: Physician Assistant

## 2018-11-01 NOTE — Progress Notes (Deleted)
NEUROLOGY FOLLOW UP OFFICE NOTE  Brandy [name] [id number]  HISTORY OF PRESENT ILLNESS: Brandy [name] is a 33 year old right-handed woman with depression, anxiety, pituitary tumor and kidney stones who follows up for migraines.  UPDATE: She was started on Aimovig but headaches continued to be daily after a month.   She saw Dr. Brett Fairy at [facility].  Headaches are moderate to severe and constant. Current NSAIDS:no Current analgesics:no Current triptans:no Current ergotamine:no Current anti-emetic:Zofran ODT 8mg  Current muscle relaxants:no Current anti-anxiolytic:Ativan 1mg  QID PRN Current sleep aide:no Current Antihypertensive medications:no Current Antidepressant medications:Cymbalta 20mg  Current Anticonvulsant medications:no Current anti-CGRP:none Current Vitamins/Herbal/Supplements:no Current Antihistamines/Decongestants:no Other therapy:no Other medication:no  Caffeine:no Alcohol:no Smoker:no Diet:hydrates Exercise:Not at this time Depression:Some postpartum depression; Anxiety:Yes Sleep: poor No prior history of headache.She is not breastfeeding  To further evaluate myalgias and arthralgias, ANA was checked, which was negative.  HISTORY: Onset:April 2019, during first trimester of first pregnancy. 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 Eye exam was normal. Labs/hormones were unremarkable. Repeat imaging since birth of her child has shown that it has decreased in size. She has had several ED visits over the past month. MRA of head and neck from 01/05/18 personally reviewed and was negative. MRI of brain with and without contrast from 03/07/18 was personally reviewed and demonstrated known pituitary adenoma but otherwise no acute process. MRV of head performed the same day was negative for dural sinus  thrombosis.  Location:Left sided, left retro-orbital/temporal/occipital, but now bilateral Quality:Pressure, "weird sensations throughout head" Intensity:Severe.Shedenies thunderclap headache. Aura:no Prodrome:no Postdrome:no Associated symptoms:Nausea, vomiting, bilateral eye lacrimation.Shedenies associated, photophobia, phonophobia,unilateral numbness or weakness. Duration:Often wakes up with it. Off and on throughout the day. Frequency:daily Frequency of abortive medication:none Triggers:no Exacerbating factors:Standing up, exertion Relieving factors:Laying down Activity:aggravates  In September-October 2019, she began experiencing nausea, constipation, arthralgias, myalgias, pain, hair loss and fatigue. She never continued the Aimovig after the first dose and symptoms persist.  TSH, B12, CRP, CK and aldolase were unremarkable.  Cambia and Ultracet were ineffective.  She went for another opinion at [facility].  MRI of brain with and without contrast from 06/17/18 showed no acute findings associated with intracranial hypotension.  54mm pituitary gland stable and stated to be within normal limits for age and gender.  MRI of cervical spine with and without contrast showed no abnormal fluid collection or enhancement.  Therefore, CSF leak was no longer suspected and she did not receive a blood patch.   Past NSAIDS:Ibuprofen, naproxen, Cambia Past analgesics:Fioricet (ineffective), Excedrin Migraine, Tylenol, Ultracet Past abortive triptans:Sumatriptan tablet (made her feel awful, palpitations) Past abortive ergotamine:no Past muscle relaxants:no Past anti-emetic:no Past antihypertensive medications:no Past antidepressant medications:fluoxetine, sertraline, Effexor Past anticonvulsant medications: lamogrigine Past anti-CGRP:Aimovig (self-discontinued after one dose due to experiencing nausea, myalgias) Past  vitamins/Herbal/Supplements:no Past antihistamines/decongestants:no Other past therapies:none  Family history of headache:no  PAST MEDICAL HISTORY: Past Medical History:  Diagnosis Date  . Anxiety   . Depression   . Gallstones   . History of nephrolithiasis   . Kidney stone   . Migraine   . Pituitary tumor   . Vitamin D deficiency     MEDICATIONS: Current Outpatient Medications on File Prior to Visit  Medication Sig Dispense Refill  . acetaminophen (TYLENOL) 500 MG tablet Take 1,000 mg by mouth every 6 (six) hours as needed for headache.    . Cholecalciferol (VITAMIN D) 2000 units CAPS Take 2,000 Units by  mouth daily.    . famotidine (PEPCID) 20 MG tablet Take 80 mg by mouth daily.    Marland Kitchen LORazepam (ATIVAN) 1 MG tablet Take 1 tablet (1 mg total) by mouth every 6 (six) hours as needed for anxiety. 15 tablet 0  . MAGNESIUM PO Take by mouth daily.    . Omega-3 Fatty Acids (FISH OIL PO) Take by mouth daily.    . ondansetron (ZOFRAN ODT) 8 MG disintegrating tablet Take 1 tablet (8 mg total) by mouth every 8 (eight) hours as needed for nausea or vomiting. 60 tablet 0   No current facility-administered medications on file prior to visit.     ALLERGIES: Allergies  Allergen Reactions  . Metoclopramide Palpitations    Rapid heart rate  . Penicillins Hives, Other (See Comments) and Rash    Childhood reaction Has patient had a PCN reaction causing immediate rash, facial/tongue/throat swelling, SOB or lightheadedness with hypotension: No Has patient had a PCN reaction causing severe rash involving mucus membranes or skin necrosis: No Has patient had a PCN reaction that required hospitalization No Has patient had a PCN reaction occurring within the last 10 years: No If all of the above answers are "NO", then may proceed with Cephalosporin use.  Childhood reaction Has patient had a PCN reaction causing immediate rash, facial/tongue/throat swelling, SOB or lightheadedness with  hypotension: No Has patient had a PCN reaction causing severe rash involving mucus membranes or skin necrosis: No Has patient had a PCN reaction that required hospitalization No Has patient had a PCN reaction occurring within the last 10 years: No If all of the above answers are "NO", then may proceed with Cephalosporin use.  Eduard Roux Other (See Comments)    Other reaction(s): Myalgias (intolerance) Constipation, hair loss  . Luvox [Fluvoxamine] Other (See Comments)    fainted  . Sumatriptan Palpitations  . Zoloft [Sertraline Hcl] Anxiety    FAMILY HISTORY: Family History  Problem Relation Age of Onset  . Diabetes Mother   . Mental illness Mother   . Asthma Mother   . Stomach cancer Mother   . Colon polyps Mother   . Heart disease Mother   . Hypertension Mother   . Arthritis Mother   . Diabetes Father   . Hypertension Father   . Anxiety disorder Sister   . Colon cancer Maternal Grandmother   . Diabetes Paternal Grandmother   . Breast cancer Maternal Aunt   . Lung cancer Maternal Aunt   . Diabetes Paternal Aunt   . Diabetes Maternal Uncle   . Diabetes Maternal Uncle    SOCIAL HISTORY: Social History   Socioeconomic History  . Marital status: Married    Spouse name: Jonathon  . Number of children: 1  . Years of education: Not on file  . Highest education level: Bachelor's degree (e.g., BA, AB, BS)  Occupational History  . 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 Financial resource strain: Not on file  . Food insecurity:    Worry: Not on file    Inability: Not on file  . Transportation needs:    Medical: Not on file    Non-medical: Not on file  Tobacco Use  . Smoking status: Never Smoker  . Smokeless tobacco: Never Used  Substance and Sexual Activity  . Alcohol use: Not Currently    Frequency: Never  . Drug use: No  . Sexual activity: Yes  Lifestyle  . Physical activity:  Days per week: Not on  file    Minutes per session: Not on file  . Stress: Not on file  Relationships  . Social connections:    Talks on phone: Not on file    Gets together: Not on file    Attends religious service: Not on file    Active member of club or organization: Not on file    Attends meetings of clubs or organizations: Not on file    Relationship status: Not on file  . Intimate partner violence:    Fear of current or ex partner: Not on file    Emotionally abused: Not on file    Physically abused: Not on file    Forced sexual activity: Not on file  Other Topics Concern  . Not on file  Social History Narrative   Patient is right-handed. She lives with her husband and child in a split-level home.    Daughter born 01/2018   She avoids caffeine.    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[employer]    REVIEW OF SYSTEMS: Constitutional: No fevers, chills, or sweats, no generalized fatigue, change in appetite Eyes: No visual changes, double vision, eye pain Ear, nose and throat: No hearing loss, ear pain, nasal congestion, sore throat Cardiovascular: No chest pain, palpitations Respiratory:  No shortness of breath at rest or with exertion, wheezes GastrointestinaI: No nausea, vomiting, diarrhea, abdominal pain, fecal incontinence Genitourinary:  No dysuria, urinary retention or frequency Musculoskeletal:  No neck pain, back pain Integumentary: No rash, pruritus, skin lesions Neurological: as above Psychiatric: No depression, insomnia, anxiety Endocrine: No palpitations, fatigue, diaphoresis, mood swings, change in appetite, change in weight, increased thirst Hematologic/Lymphatic:  No purpura, petechiae. Allergic/Immunologic: no itchy/runny eyes, nasal congestion, recent allergic reactions, rashes  PHYSICAL EXAM: *** General: No acute distress.  Patient appears ***-groomed.  *** body habitus. Head:  Normocephalic/atraumatic Eyes:  Fundi examined but not visualized Neck: supple, no  paraspinal tenderness, full range of motion Heart:  Regular rate and rhythm Lungs:  Clear to auscultation bilaterally Back: No paraspinal tenderness Neurological Exam: alert and oriented to person, place, and time. Attention span and concentration intact, recent and remote memory intact, fund of knowledge intact.  Speech fluent and not dysarthric, language intact.  CN II-XII intact. Bulk and tone normal, muscle strength 5/5 throughout.  Sensation to light touch intact.  Deep tendon reflexes 2+ throughout, toes downgoing.  Finger to nose and heel to shin testing intact.  Gait normal, Romberg negative.  IMPRESSION: 1.  Chronic migraine without aura, without status migrainosus, *** intractable 2.  Myalgias/arthralgias.  ***  PLAN: 1.  For preventative management, *** 2.  For abortive therapy, *** 3.  Limit use of pain relievers to no more than 2 days out of week to prevent risk of rebound or medication-overuse headache. 4.  Keep headache diary 5.  Exercise, hydration, caffeine cessation, sleep hygiene, monitor for and avoid triggers 6.  Consider:  magnesium citrate 400mg  daily, riboflavin 400mg  daily, and coenzyme Q10 100mg  three times daily 7.  Follow up ***  [name], DO  CC: Betty Martinique, MD

## 2018-11-03 ENCOUNTER — Ambulatory Visit: Payer: 59 | Admitting: Neurology

## 2018-11-03 ENCOUNTER — Telehealth: Payer: Self-pay

## 2018-11-03 ENCOUNTER — Encounter: Payer: Self-pay | Admitting: Neurology

## 2018-11-03 NOTE — Telephone Encounter (Signed)
Copied from [place] [id number]. Topic: Referral - Request for Referral >> Nov 03, 2018  9:12 AM Bea Graff, NT wrote: Has patient seen PCP for this complaint? Yes.   *If NO, is insurance requiring patient see PCP for this issue before PCP can refer them? Referral for which specialty: Rheumatology Preferred provider/office: [facility] Phone#: [phone] Reason for referral: Muscle pain

## 2018-11-04 ENCOUNTER — Telehealth: Payer: Self-pay | Admitting: Physician Assistant

## 2018-11-04 ENCOUNTER — Other Ambulatory Visit: Payer: Self-pay

## 2018-11-04 MED ORDER — HYDROXYZINE HCL 10 MG PO TABS: ORAL_TABLET | ORAL | 0 refills | Status: DC

## 2018-11-04 NOTE — Telephone Encounter (Signed)
Given information, instructed to keep appt on Monday with provider. Instructed to try the hydroxyzine for her anxiety and to help her sleep at bedtime, also recommended melatonin. rx to be submitted to [facility] on [address].

## 2018-11-04 NOTE — Telephone Encounter (Signed)
Pt called over lunch and had the answering service page Brandy [name]. She is having bad panic attacks and the medication, Ativan is not helping. She took 1 pill at around 1:45pm and another 1/2 at 2:00pm. I told her we would have the nurst triage, but not to take more meds right now.

## 2018-11-04 NOTE — Telephone Encounter (Signed)
Patient called after lunch stating she needs a refill on her Ativan although she states it's not working. Patient is scheduled for a office visit on Mon 3/9.

## 2018-11-04 NOTE — Telephone Encounter (Signed)
Call pt and tell her Ativan is denied. Send in  hydroxyzine 10 mg #30, 1-2 q6hr prn. No RF.  If she misses one more appt, I'm not giving her anymore meds.

## 2018-11-04 NOTE — Telephone Encounter (Signed)
Is it okay to place referral? 

## 2018-11-05 ENCOUNTER — Emergency Department (HOSPITAL_BASED_OUTPATIENT_CLINIC_OR_DEPARTMENT_OTHER)
Admission: EM | Admit: 2018-11-05 | Discharge: 2018-11-05 | Disposition: A | Discharge status: Home / Self Care | Payer: 59 | Attending: Emergency Medicine | Admitting: Emergency Medicine

## 2018-11-05 ENCOUNTER — Encounter (HOSPITAL_BASED_OUTPATIENT_CLINIC_OR_DEPARTMENT_OTHER): Payer: Self-pay | Admitting: Adult Health

## 2018-11-05 ENCOUNTER — Other Ambulatory Visit: Payer: Self-pay

## 2018-11-05 DIAGNOSIS — Z79899 Other long term (current) drug therapy: Secondary | ICD-10-CM | POA: Diagnosis not present

## 2018-11-05 DIAGNOSIS — G8929 Other chronic pain: Secondary | ICD-10-CM | POA: Insufficient documentation

## 2018-11-05 DIAGNOSIS — R109 Unspecified abdominal pain: Secondary | ICD-10-CM | POA: Diagnosis present

## 2018-11-05 DIAGNOSIS — R110 Nausea: Secondary | ICD-10-CM

## 2018-11-05 LAB — CBC WITH DIFFERENTIAL/PLATELET
Abs Immature Granulocytes: 0.03 10*3/uL (ref 0.00–0.07)
BASOS PCT: 0 %
Basophils Absolute: 0 10*3/uL (ref 0.0–0.1)
Eosinophils Absolute: 0 10*3/uL (ref 0.0–0.5)
Eosinophils Relative: 0 %
HCT: 41.6 % (ref 36.0–46.0)
Hemoglobin: 13.1 g/dL (ref 12.0–15.0)
Immature Granulocytes: 0 %
Lymphocytes Relative: 16 %
Lymphs Abs: 1.3 10*3/uL (ref 0.7–4.0)
MCH: 28.9 pg (ref 26.0–34.0)
MCHC: 31.5 g/dL (ref 30.0–36.0)
MCV: 91.6 fL (ref 80.0–100.0)
Monocytes Absolute: 0.4 10*3/uL (ref 0.1–1.0)
Monocytes Relative: 5 %
NRBC: 0 % (ref 0.0–0.2)
Neutro Abs: 6.1 10*3/uL (ref 1.7–7.7)
Neutrophils Relative %: 79 %
Platelets: 335 10*3/uL (ref 150–400)
RBC: 4.54 MIL/uL (ref 3.87–5.11)
RDW: 13.2 % (ref 11.5–15.5)
WBC: 7.8 10*3/uL (ref 4.0–10.5)

## 2018-11-05 LAB — URINALYSIS, MICROSCOPIC (REFLEX): WBC, UA: NONE SEEN WBC/hpf (ref 0–5)

## 2018-11-05 LAB — URINALYSIS, ROUTINE W REFLEX MICROSCOPIC
Bilirubin Urine: NEGATIVE
Glucose, UA: NEGATIVE mg/dL
KETONES UR: NEGATIVE mg/dL
Leukocytes,Ua: NEGATIVE
Nitrite: NEGATIVE
Protein, ur: NEGATIVE mg/dL
Specific Gravity, Urine: 1.02 (ref 1.005–1.030)
pH: 6 (ref 5.0–8.0)

## 2018-11-05 LAB — COMPREHENSIVE METABOLIC PANEL
ALT: 11 U/L (ref 0–44)
AST: 13 U/L — ABNORMAL LOW (ref 15–41)
Albumin: 4.2 g/dL (ref 3.5–5.0)
Alkaline Phosphatase: 50 U/L (ref 38–126)
Anion gap: 8 (ref 5–15)
BUN: 12 mg/dL (ref 6–20)
CO2: 24 mmol/L (ref 22–32)
Calcium: 9 mg/dL (ref 8.9–10.3)
Chloride: 105 mmol/L (ref 98–111)
Creatinine, Ser: 0.87 mg/dL (ref 0.44–1.00)
GFR calc Af Amer: >60 mL/min (ref >60)
GFR calc non Af Amer: >60 mL/min (ref >60)
Glucose, Bld: 114 mg/dL — ABNORMAL HIGH (ref 70–99)
Potassium: 3.6 mmol/L (ref 3.5–5.1)
Sodium: 137 mmol/L (ref 135–145)
Total Bilirubin: 0.5 mg/dL (ref 0.3–1.2)
Total Protein: 7.5 g/dL (ref 6.5–8.1)

## 2018-11-05 LAB — LIPASE, BLOOD: Lipase: 32 U/L (ref 11–51)

## 2018-11-05 LAB — PREGNANCY, URINE: Preg Test, Ur: NEGATIVE

## 2018-11-05 MED ORDER — FAMOTIDINE 20 MG PO TABS: 20.0000 mg | ORAL_TABLET | Freq: Two times a day (BID) | ORAL | 0 refills | Status: DC

## 2018-11-05 MED ORDER — KETOROLAC TROMETHAMINE 30 MG/ML IJ SOLN
30.0000 mg | Freq: Once | INTRAMUSCULAR | Status: AC
  Administered 2018-11-05: 30 mg via INTRAVENOUS
  Filled 2018-11-05: qty 1

## 2018-11-05 MED ORDER — SUCRALFATE 1 GM/10ML PO SUSP: 1.0000 g | Freq: Three times a day (TID) | ORAL | 0 refills | Status: DC

## 2018-11-05 MED ORDER — ONDANSETRON HCL 4 MG/2ML IJ SOLN
4.0000 mg | Freq: Once | INTRAMUSCULAR | Status: AC
  Administered 2018-11-05: 4 mg via INTRAVENOUS
  Filled 2018-11-05: qty 2

## 2018-11-05 MED ORDER — LIDOCAINE VISCOUS HCL 2 % MT SOLN
15.0000 mL | Freq: Once | OROMUCOSAL | Status: DC
  Filled 2018-11-05: qty 15

## 2018-11-05 MED ORDER — ALUM & MAG HYDROXIDE-SIMETH 200-200-20 MG/5ML PO SUSP
30.0000 mL | Freq: Once | ORAL | Status: AC
  Administered 2018-11-05: 30 mL via ORAL
  Filled 2018-11-05: qty 30

## 2018-11-05 MED ORDER — PROMETHAZINE HCL 25 MG/ML IJ SOLN: 25.0000 mg | Freq: Once | INTRAMUSCULAR | Status: DC

## 2018-11-05 MED ORDER — SODIUM CHLORIDE 0.9 % IV BOLUS
1000.0000 mL | Freq: Once | INTRAVENOUS | Status: AC
  Administered 2018-11-05: 1000 mL via INTRAVENOUS

## 2018-11-05 MED ORDER — LORAZEPAM 2 MG/ML IJ SOLN
1.0000 mg | Freq: Once | INTRAMUSCULAR | Status: AC
  Administered 2018-11-05: 1 mg via INTRAVENOUS
  Filled 2018-11-05: qty 1

## 2018-11-05 NOTE — ED Notes (Signed)
NAD at this time. Pt is stable and going home.  

## 2018-11-05 NOTE — ED Triage Notes (Signed)
Presents with muscle aches, abdominal pain that is generalized and emesis x3 times per day. She has been seen for this multiple times and has tried phenergan, zofran and is allergic to reglan. This all began 9 months after the birth of her child. She endorses weakness and hurting all over. She has been tested for a pituitary tumor, which was negative.  They Dx her with migraines and gave her an Amovig shot in August. Since the shot she states, "I have felt the worst I have ever felt in my life. I have a lot of anxiet yand no one can figure it out They keep blaming it on Post partum depression but it not what it is" She is tearful.

## 2018-11-05 NOTE — ED Provider Notes (Signed)
[facility] EMERGENCY DEPARTMENT Provider Note   CSN: [id number] Arrival date & time: 11/05/18  9326    History   Chief Complaint Chief Complaint  Patient presents with  . Abdominal Pain    HPI Brandy [name] is a 33 y.o. female with past medical history of anxiety, depression, gallstones, kidney stone, migraine who presents for evaluation of worsening abdominal pain.  Patient has been having abdominal pain, nausea/vomiting over the last several months.  Since summer 2018, she has had intermittent abdominal pain.  She has also had some generalized body aches and myalgias, generalized weakness.  She has seen cardiology, neurology and GI as well as her PCP for evaluation of symptoms.  She has had testing done by all the specialists as well as imaging with no conclusive finding.  Patient states that her PCP told her it was most likely due to postpartum depression but patient states she continues to experience symptoms.  She comes in today because she states that for the last 2 weeks, her pain has gotten more severe.  She also reports she has been having some nausea/vomiting.  She states that the pain that she is experiencing is the same pain that she has had over the last several months.  Additionally, her generalized body aches and weakness is the same symptoms that she has been having.  She denies any focal weakness.  She does reports that she will come sometimes wake up in the middle night and be sweating and hot.  She states she turns on the fan and she has no issues.  She has not had any fever.  Patient states that emesis is nonbloody, nonbilious.  She is able to tolerate some p.o.  Patient states that she has not had any chest pain, difficulty breathing, dysuria, hematuria, vaginal discharge.     The history is provided by the patient.    Past Medical History:  Diagnosis Date  . Anxiety   . Depression   . Gallstones   . History of nephrolithiasis   . Kidney stone   .  Migraine   . Pituitary tumor   . Vitamin D deficiency     Patient Active Problem List   Diagnosis Date Noted  . Dysphagia 07/20/2018  . Anxiety associated with birthing process 06/14/2018  . 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 Chronic mixed headache syndrome 06/14/2018  . Medication overuse headache 06/14/2018  . Headache, unspecified headache type 03/05/2018  . SVD (spontaneous vaginal delivery) 02/02/2018  . Postpartum care following vaginal delivery 02/02/2018  . Headache in pregnancy, third trimester 01/31/2018  . Class 1 obesity with body mass index (BMI) of 31.0 to 31.9 in adult 05/04/2017  . Nausea without vomiting 02/18/2017  . Abdominal pain, epigastric 02/18/2017  . Vitamin D deficiency 07/21/2016  . B12 deficiency 07/21/2016  . Generalized anxiety disorder 06/09/2016  . Anterior neck pain 12/30/2015  . Enlarged thyroid gland 12/30/2015  . Fatigue 12/30/2015  . Routine general medical examination at a health care facility 09/27/2014  . Tendinitis of right ankle 08/14/2014  . Nausea vomiting and diarrhea 12/28/2013  . Serum calcium elevated 12/28/2013  . 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 DUB (dysfunctional uterine bleeding) 06/04/2011  . GERD (gastroesophageal reflux disease) 02/19/2011  . Depression 09/06/2008  . NEPHROLITHIASIS, HX OF 12/16/2007    Past Surgical History:  Procedure Laterality Date  . CHOLECYSTECTOMY  2014  . KIDNEY STONE SURGERY     x 5  . MOUTH SURGERY    . TYMPANOSTOMY  TUBE PLACEMENT       OB History    Gravida  3   Para  1   Term  1   Preterm  0   AB  1   Living  1     SAB  1   TAB  0   Ectopic  0   Multiple  0   Live Births  1        Obstetric Comments  Age first period 39         Home Medications    Prior to Admission medications   Medication Sig Start Date End Date Taking? Authorizing Provider  ondansetron (ZOFRAN) 4 MG tablet Take 4 mg by mouth every 8 (eight) hours as needed for nausea or vomiting.    Yes [provider]  acetaminophen (TYLENOL) 500 MG tablet Take 1,000 mg by mouth every 6 (six) hours as needed for headache.    [provider]  Cholecalciferol (VITAMIN D) 2000 units CAPS Take 2,000 Units by mouth daily.    [provider]  famotidine (PEPCID) 20 MG tablet Take 1 tablet (20 mg total) by mouth 2 (two) times daily. 11/05/18   Volanda Napoleon, PA-C  hydrOXYzine (ATARAX/VISTARIL) 10 MG tablet 1-2 tablets every 6 hours as needed for anxiety 11/04/18   Donnal Moat T, PA-C  LORazepam (ATIVAN) 1 MG tablet Take 1 tablet (1 mg total) by mouth every 6 (six) hours as needed for anxiety. 10/18/18   Donnal Moat T, PA-C  LORazepam (ATIVAN) 1 MG tablet Take 1 mg by mouth every 8 (eight) hours.    [provider]  MAGNESIUM PO Take by mouth daily.    [provider]  Omega-3 Fatty Acids (FISH OIL PO) Take by mouth daily.    [provider]  ondansetron (ZOFRAN ODT) 8 MG disintegrating tablet Take 1 tablet (8 mg total) by mouth every 8 (eight) hours as needed for nausea or vomiting. 05/04/18   Gatha Mayer, MD  sucralfate (CARAFATE) 1 GM/10ML suspension Take 10 mLs (1 g total) by mouth 4 (four) times daily -  with meals and at bedtime. 11/05/18   Volanda Napoleon, PA-C    Family History Family History  Problem Relation Age of Onset  . Diabetes Mother   . Mental illness Mother   . Asthma Mother   . Stomach cancer Mother   . Colon polyps Mother   . Heart disease Mother   . Hypertension Mother   . Arthritis Mother   . Diabetes Father   . Hypertension Father   . Anxiety disorder Sister   . Colon cancer Maternal Grandmother   . Diabetes Paternal Grandmother   . Breast cancer Maternal Aunt   . Lung cancer Maternal Aunt   . Diabetes Paternal Aunt   . Diabetes Maternal Uncle   . Diabetes Maternal Uncle     Social History Social History   Tobacco Use  . Smoking status: Never Smoker  . Smokeless tobacco: Never Used  Substance  Use Topics  . Alcohol use: Not Currently    Frequency: Never  . Drug use: No     Allergies   Metoclopramide; Penicillins; Erenumab-aooe; Luvox [fluvoxamine]; Sumatriptan; and Zoloft [sertraline hcl]   Review of Systems Review of Systems  Constitutional: Negative for fever.  Respiratory: Negative for cough and shortness of breath.   Cardiovascular: Negative for chest pain.  Gastrointestinal: Positive for abdominal pain. Negative for nausea and vomiting.  Genitourinary: Negative for dysuria and  hematuria.  Neurological: Positive for weakness. Negative for headaches.  All other systems reviewed and are negative.    Physical Exam Updated Vital Signs BP 111/69 (BP Location: Right Arm)   Pulse 72   Temp 98.2 F (36.8 C) (Oral)   Resp 16   Ht 5\' 9"  (1.753 m)   Wt 79.4 kg   LMP 10/31/2018   SpO2 99%   Breastfeeding No   BMI 25.84 kg/m   Physical Exam Vitals signs and nursing note reviewed.  Constitutional:      Appearance: Normal appearance. She is well-developed.     Comments: Sitting comfortably on examination table  HENT:     Head: Normocephalic and atraumatic.  Eyes:     General: Lids are normal.     Conjunctiva/sclera: Conjunctivae normal.     Pupils: Pupils are equal, round, and reactive to light.  Neck:     Musculoskeletal: Full passive range of motion without pain.  Cardiovascular:     Rate and Rhythm: Normal rate and regular rhythm.     Pulses: Normal pulses.     Heart sounds: Normal heart sounds. No murmur. No friction rub. No gallop.   Pulmonary:     Effort: Pulmonary effort is normal.     Breath sounds: Normal breath sounds.     Comments: Lungs clear to auscultation bilaterally.  Symmetric chest rise.  No wheezing, rales, rhonchi. Abdominal:     Palpations: Abdomen is soft. Abdomen is not rigid.     Tenderness: There is abdominal tenderness in the epigastric area and periumbilical area. There is no right CVA tenderness, left CVA tenderness or guarding.  Negative signs include McBurney's sign.     Comments: Abdomen is soft, nondistended.  Tenderness noted to epigastric region that goes into the periumbilical region.  No focal point.  No rigidity, guarding.  No CVA tenderness bilaterally.  Negative Murphy sign.  No tenderness noted at McBurney's point.  Musculoskeletal: Normal range of motion.  Skin:    General: Skin is warm and dry.     Capillary Refill: Capillary refill takes less than 2 seconds.  Neurological:     Mental Status: She is alert and oriented to person, place, and time.     Comments: Cranial nerves III-XII intact Follows commands, Moves all extremities  5/5 strength to BUE and BLE  Sensation intact throughout all major nerve distributions Normal finger to nose. No dysdiadochokinesia. No pronator drift. No gait abnormalities  No slurred speech. No facial droop.   Psychiatric:        Speech: Speech normal.      ED Treatments / Results  Labs (all labs ordered are listed, but only abnormal results are displayed) Labs Reviewed  COMPREHENSIVE METABOLIC PANEL - Abnormal; Notable for the following components:      Result Value   Glucose, Bld 114 (*)    AST 13 (*)    All other components within normal limits  URINALYSIS, ROUTINE W REFLEX MICROSCOPIC - Abnormal; Notable for the following components:   APPearance CLOUDY (*)    Hgb urine dipstick TRACE (*)    All other components within normal limits  URINALYSIS, MICROSCOPIC (REFLEX) - Abnormal; Notable for the following components:   Bacteria, UA RARE (*)    All other components within normal limits  CBC WITH DIFFERENTIAL/PLATELET  PREGNANCY, URINE  LIPASE, BLOOD    EKG None  Radiology No results found.  Procedures Procedures (including critical care time)  Medications Ordered in ED Medications  sodium chloride  0.9 % bolus 1,000 mL (0 mLs Intravenous Stopped 11/05/18 1211)  ondansetron (ZOFRAN) injection 4 mg (4 mg Intravenous Given 11/05/18 1050)  ketorolac  (TORADOL) 30 MG/ML injection 30 mg (30 mg Intravenous Given 11/05/18 1141)  LORazepam (ATIVAN) injection 1 mg (1 mg Intravenous Given 11/05/18 1141)  alum & mag hydroxide-simeth (MAALOX/MYLANTA) 200-200-20 MG/5ML suspension 30 mL (30 mLs Oral Given 11/05/18 1150)     Initial Impression / Assessment and Plan / ED Course  I have reviewed the triage vital signs and the nursing notes.  Pertinent labs & imaging results that were available during my care of the patient were reviewed by me and considered in my medical decision making (see chart for details).        33 year old female who presents for evaluation of abdominal pain, nausea/vomiting, generalized weakness.  Ports is been ongoing for the last 8 months.  She has had work-up by multiple providers specialties with no conclusive finding.  She comes in today because pain persists.  She does report pain is little bit more severe than it normally is.  No fevers.  She does mention that at night sometimes she will wake up hot and sweaty but then turns off and on and is fine.  She does not have to change her clothes or change the sheets. Patient is afebrile, non-toxic appearing, sitting comfortably on examination table. Vital signs reviewed and stable.  On exam, she has some mild epigastric tenderness.  Abdomen otherwise benign.  No neuro deficits noted on exam.  Plan to check labs.  CMP is unremarkable.  Elevations in LFTs.  UA shows trace hemoglobin but otherwise no infectious etiology.  Lipase unremarkable.  CBC shows no significant leukocytosis or anemia.  Urine pregnancy negative.  Discussed results with patient.  She does report feeling somewhat better after analgesics and antiemetics here in the ED.  Repeat abdominal exam is improved though she still is mildly tender in the epigastric region.  Abdomen is soft.  Exam not concerning for appendicitis, perforation, obstruction.  I discussed with patient that since this is her pain that she has been  experiencing for the last several months as well as her reassuring abdominal exam, I do not feel that further CT imaging is warranted at this time.  I discussed with patient that she will likely need to follow-up with her GI doctor for further evaluation of her symptoms. At this time, patient exhibits no emergent life-threatening condition that require further evaluation in ED or admission. Patient had ample opportunity for questions and discussion. All patient's questions were answered with full understanding. Strict return precautions discussed. Patient expresses understanding and agreement to plan.   Portions of this note were generated with [employer]. Dictation errors may occur despite best attempts at proofreading.   Final Clinical Impressions(s) / ED Diagnoses   Final diagnoses:  Chronic abdominal pain  Nausea    ED Discharge Orders         Ordered    famotidine (PEPCID) 20 MG tablet  2 times daily     11/05/18 1300    sucralfate (CARAFATE) 1 GM/10ML suspension  3 times daily with meals & bedtime     11/05/18 1300           Desma Mcgregor 11/05/18 1918    Jola Schmidt, MD 11/06/18 [phone]

## 2018-11-05 NOTE — Discharge Instructions (Signed)
Take Pepcid and Carafate as discussed.  You can take 1000 mg of Tylenol.  Do not exceed 4000 mg of Tylenol a day.  As we discussed, it may be helpful to follow-up with your GI doctor for further evaluation of your symptoms.  Return the emergency department for any high fever, difficulty breathing, inability to keep food down, persistent vomiting or any other worsening concerning symptoms.

## 2018-11-07 ENCOUNTER — Ambulatory Visit: Payer: 59 | Admitting: Physician Assistant

## 2018-11-07 ENCOUNTER — Telehealth: Payer: Self-pay | Admitting: Physician Assistant

## 2018-11-07 ENCOUNTER — Encounter: Payer: Self-pay | Admitting: Physician Assistant

## 2018-11-07 DIAGNOSIS — F53 Postpartum depression: Secondary | ICD-10-CM | POA: Diagnosis not present

## 2018-11-07 DIAGNOSIS — O99345 Other mental disorders complicating the puerperium: Secondary | ICD-10-CM | POA: Diagnosis not present

## 2018-11-07 DIAGNOSIS — F411 Generalized anxiety disorder: Secondary | ICD-10-CM

## 2018-11-07 MED ORDER — ESCITALOPRAM OXALATE 5 MG PO TABS: 5.0000 mg | ORAL_TABLET | Freq: Every day | ORAL | 1 refills | Status: DC

## 2018-11-07 MED ORDER — LORAZEPAM 1 MG PO TABS: 1.0000 mg | ORAL_TABLET | Freq: Two times a day (BID) | ORAL | 2 refills | Status: DC | PRN

## 2018-11-07 NOTE — Progress Notes (Signed)
[facility]  Patient ID: Brandy [name],  MRN: [id number]  PCP: Martinique, Betty G, MD  Date of Evaluation: 11/07/18 Time spent:25 minutes  Chief Complaint:  Chief Complaint    Follow-up      HISTORY/CURRENT STATUS: HPI presents for anxiety and depression.  Patient was last seen October 2019.  She has had to miss several appointments for various reasons.  Still has a lot of physical problems including chronic generalized pain.  "Nobody can give me a diagnosis.  I have had every test known to man.  I do not understand what is wrong and why somebody cannot tell me what is wrong and what to do about it.  I never felt this way until I had my baby 9 months ago.  I really think it was the Amovig shot that started at all."  She denies any fevers or chills, weight loss or night sweats.  Continues to have a lot of anxiety.  She will have panic attacks several times a week but also on a daily basis she feels that internal uneasiness and like something bad is going to happen.  "I just know something is bad wrong with me and they cannot figure it out or tell me what to do.  The Ativan does help but if I do not take it when I start feeling anxious, it does not work as well."  She has had a lot of changes with her job, which causes a lot stress between her and her husband at home financially.  (See below.)  She also feels like he does not understand her physical illness.    She feels tired a lot and does not have a lot of motivation.  States her little girl as all that keeps her going.  She does not really enjoy anything but also reports that she does not have a lot of time.  Cries easily.  States she is eating very healthy a lot of fruits and vegetables and she tries to exercise on a regular basis although that does not happen as much she would like it to.  Patient denies increased energy with decreased need for sleep, no increased talkativeness, no racing thoughts, no impulsivity or risky  behaviors, no increased spending, no increased libido, no grandiosity.  Denies muscle or joint stiffness, or dystonia.  Denies dizziness, syncope, seizures, numbness, tingling, tremor, tics, unsteady gait, slurred speech, confusion.   Individual Medical History/ Review of Systems: Changes? :No    Past medications for mental health diagnoses include: Prozac, Luvox, Elavil, Ativan, Xanax, Zoloft, Seroquel, Lithium for 1 pill, Buspar, Inderal, Cymbalta, NAC, Turmeric  Allergies: Metoclopramide; Penicillins; Erenumab-aooe; Luvox [fluvoxamine]; Sumatriptan; and Zoloft [sertraline hcl]  Current Medications:  Current Outpatient Medications:  .  acetaminophen (TYLENOL) 500 MG tablet, Take 1,000 mg by mouth every 6 (six) hours as needed for headache., Disp: , Rfl:  .  Cholecalciferol (VITAMIN D) 2000 units CAPS, Take 2,000 Units by mouth daily., Disp: , Rfl:  .  famotidine (PEPCID) 20 MG tablet, Take 1 tablet (20 mg total) by mouth 2 (two) times daily., Disp: 30 tablet, Rfl: 0 .  LORazepam (ATIVAN) 1 MG tablet, Take 1 tablet (1 mg total) by mouth every 6 (six) hours as needed for anxiety., Disp: 15 tablet, Rfl: 0 .  MAGNESIUM PO, Take by mouth daily., Disp: , Rfl:  .  Omega-3 Fatty Acids (FISH OIL PO), Take by mouth daily., Disp: , Rfl:  .  ondansetron (ZOFRAN) 4 MG tablet, Take 4 mg  by mouth every 8 (eight) hours as needed for nausea or vomiting., Disp: , Rfl:  .  escitalopram (LEXAPRO) 5 MG tablet, Take 1 tablet (5 mg total) by mouth daily., Disp: 30 tablet, Rfl: 1 .  LORazepam (ATIVAN) 1 MG tablet, Take 1 tablet (1 mg total) by mouth 2 (two) times daily as needed for anxiety., Disp: 60 tablet, Rfl: 2 .  ondansetron (ZOFRAN ODT) 8 MG disintegrating tablet, Take 1 tablet (8 mg total) by mouth every 8 (eight) hours as needed for nausea or vomiting., Disp: 60 tablet, Rfl: 0 .  sucralfate (CARAFATE) 1 GM/10ML suspension, Take 10 mLs (1 g total) by mouth 4 (four) times daily -  with meals and at  bedtime., Disp: 420 mL, Rfl: 0 Medication Side Effects: none  Family Medical/ Social History: Changes? 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 states she gave up her dream job because she was having to see so many patients who have cancer and with her on symptoms she was not able to mentally handle that anymore.  MENTAL HEALTH EXAM:  Last menstrual period 10/31/2018, not currently breastfeeding.There is no height or weight on file to calculate BMI.  General Appearance: Casual and Well Groomed  Eye Contact:  Good  Speech:  Clear and Coherent  Volume:  Normal  Mood:  Anxious and Depressed  Affect:  Depressed, Tearful and Anxious  Thought Process:  Goal Directed  Orientation:  Full (Time, Place, and Person)  Thought Content: Logical   Suicidal Thoughts:  No  Homicidal Thoughts:  No  Memory:  WNL  Judgement:  Good  Insight:  Good  Psychomotor Activity:  Normal  Concentration:  Concentration: Good  Recall:  Good  Fund of Knowledge: Good  Language: Good  Assets:  Desire for Improvement  ADL's:  Intact  Cognition: WNL  Prognosis:  Good    DIAGNOSES:    ICD-10-CM   1. Generalized anxiety disorder F41.1   2. 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 I strongly recommend that we try another antidepressant which will also help prevent anxiety.  She asked specifically about Lexapro.  States she has several friends to take it and it works well for them.  I am completely fine with trying that although she has already tried 3 SSRIs, but that does not mean this 1 will not work.  Other options would be Effexor or Pristiq or 1 of the newer drugs like Trintellix or Viibryd.  We may even need to use Latuda or another atypical.  Hopefully we will have to go to those  other options. Start Lexapro 5 mg p.o. every morning.  She understands this is the lowest dose and we may need to increase.  We discussed the different side effects that can occur but have encouraged her to try her best to push through the side effects if any occur.  Of course if she should get a rash, shortness of breath, trouble swallowing, etc., stop the medication immediately and call me. Continue Ativan 1 mg twice daily as needed.  PDMP was reviewed I strongly recommend she get back in to see a counselor.  She verbalizes understanding. We discussed different vitamins and supplements including a multivitamin, B complex, vitamin D, she took NAC which did not help, add ginkgo  biloba, fish oil. We also discussed [place] and I gave her some information to think about that.  She will let me know if she is interested. Sleep hygiene was discussed. Return in 4 weeks or sooner as needed.  Donnal Moat, PA-C   This record has been created using [employer].  Chart creation errors have been sought, but may not always have been located and corrected. Such creation errors do not reflect on the standard of medical care.

## 2018-11-07 NOTE — Telephone Encounter (Signed)
Pt called running late. Got here 20 mins late. Rescheduled to 3/25. Ask  For refill on Ativan at [facility] in [place]

## 2018-11-07 NOTE — Patient Instructions (Addendum)
Consider [place] (Transcranial Magnetic Stimulation)   Call [facility], Dionne Bucy, for more info [phone].   Gingko Biloba

## 2018-11-09 ENCOUNTER — Other Ambulatory Visit: Payer: Self-pay | Admitting: *Deleted

## 2018-11-09 DIAGNOSIS — O021 Missed abortion: Secondary | ICD-10-CM

## 2018-11-09 DIAGNOSIS — N920 Excessive and frequent menstruation with regular cycle: Secondary | ICD-10-CM | POA: Insufficient documentation

## 2018-11-09 DIAGNOSIS — M791 Myalgia, unspecified site: Secondary | ICD-10-CM

## 2018-11-09 HISTORY — DX: Excessive and frequent menstruation with regular cycle: N92.0

## 2018-11-09 HISTORY — DX: Missed abortion: O02.1

## 2018-11-09 NOTE — Telephone Encounter (Signed)
Referral placed as requested, patient informed.

## 2018-11-09 NOTE — Telephone Encounter (Signed)
Pt called back in to check on the status of this referral ?    Pt is is having pain between her shoulder blade which Radiates to hands and arms

## 2018-11-15 ENCOUNTER — Other Ambulatory Visit: Payer: Self-pay

## 2018-11-15 ENCOUNTER — Ambulatory Visit (INDEPENDENT_AMBULATORY_CARE_PROVIDER_SITE_OTHER): Payer: 59 | Admitting: Family Medicine

## 2018-11-15 ENCOUNTER — Encounter: Payer: Self-pay | Admitting: Family Medicine

## 2018-11-15 VITALS — BP 124/83 | HR 85 | Temp 98.3°F | Resp 12 | Ht 69.0 in | Wt 184.4 lb

## 2018-11-15 DIAGNOSIS — M791 Myalgia, unspecified site: Secondary | ICD-10-CM

## 2018-11-15 DIAGNOSIS — R059 Cough, unspecified: Secondary | ICD-10-CM

## 2018-11-15 DIAGNOSIS — M549 Dorsalgia, unspecified: Secondary | ICD-10-CM | POA: Diagnosis not present

## 2018-11-15 DIAGNOSIS — J069 Acute upper respiratory infection, unspecified: Secondary | ICD-10-CM | POA: Diagnosis not present

## 2018-11-15 DIAGNOSIS — R05 Cough: Secondary | ICD-10-CM

## 2018-11-15 MED ORDER — BENZONATATE 100 MG PO CAPS: 200.0000 mg | ORAL_CAPSULE | Freq: Two times a day (BID) | ORAL | 0 refills | Status: AC | PRN

## 2018-11-15 MED ORDER — CYCLOBENZAPRINE HCL 10 MG PO TABS: 10.0000 mg | ORAL_TABLET | Freq: Every evening | ORAL | 0 refills | Status: AC | PRN

## 2018-11-15 NOTE — Progress Notes (Signed)
ACUTE VISIT  HPI:  Chief Complaint  Patient presents with  . Shoulder Blade Pain    started a while ago  . Cough    started 1 week ago    [name] is a 33 y.o.female here today complaining of 5-7 days of respiratory symptoms. She has not noted fever, chills, or unusual fatigue. + Sore throat, denies dysphagia or stridor.  Nonproductive cough, denies wheezing or dyspnea.   URI   This is a new problem. The current episode started in the past 7 days. The problem has been gradually improving. There has been no fever. Associated symptoms include congestion, coughing, a plugged ear sensation, rhinorrhea and a sore throat. Pertinent negatives include no abdominal pain, diarrhea, ear pain, headaches, nausea, neck pain, rash, sneezing, vomiting or wheezing. She has tried acetaminophen for the symptoms. The treatment provided mild relief.   No Hx of recent travel. No known sick contact but she works at [employer]. No known insect bite.  Hx of allergies: Yes.  OTC medications for this problem: Delsym and Tylenol.  History of GERD, intermittent heartburn, "horrible." She has an appointment with GI. Taking Pepcid and Carafate. Occasional nausea,no vomiting or abdominal pain.  Also complaining about 2 months of bilateral upper back pain. She denies any prior history.  Intermittent pain, sharp/achy, 7/10. No history of trauma. Pain is exacerbated by movement and alleviated by rest.  She also has upper extremity pain, involving area from shoulder to hands, no dermatomal distribution. She is not sure if it is radiated pain from upper back. She denies numbness, tingling, or burning sensation. No limitation of shoulders, elbow, or wrist range of motion. She has not noted joint edema or erythema. Chest wall pain, exacerbated by deep breathing and coughing.  Last visit she was reported pain "all over", she requested referral to rheumatologist, appointment  information pending. She is taking OTC supplement, which has helped tremendously, "Restore."  Review of Systems  Constitutional: Positive for activity change and fatigue. Negative for appetite change, chills and fever.  HENT: Positive for congestion, postnasal drip, rhinorrhea and sore throat. Negative for ear pain, mouth sores, sinus pressure, sneezing, trouble swallowing and voice change.   Eyes: Negative for discharge and redness.  Respiratory: Positive for cough. Negative for shortness of breath and wheezing.   Gastrointestinal: Negative for abdominal pain, diarrhea, nausea and vomiting.  Musculoskeletal: Positive for back pain and myalgias. Negative for gait problem, joint swelling and neck pain.  Skin: Negative for rash.  Allergic/Immunologic: Positive for environmental allergies.  Neurological: Negative for weakness, numbness and headaches.  Hematological: Negative for adenopathy.  Psychiatric/Behavioral: Negative for confusion. The patient is nervous/anxious.     Current Outpatient Medications on File Prior to Visit  Medication Sig Dispense Refill  . acetaminophen (TYLENOL) 500 MG tablet Take 1,000 mg by mouth every 6 (six) hours as needed for headache.    . Cholecalciferol (VITAMIN D) 2000 units CAPS Take 2,000 Units by mouth daily.    Marland Kitchen escitalopram (LEXAPRO) 5 MG tablet Take 1 tablet (5 mg total) by mouth daily. 30 tablet 1  . famotidine (PEPCID) 20 MG tablet Take 1 tablet (20 mg total) by mouth 2 (two) times daily. 30 tablet 0  . LORazepam (ATIVAN) 1 MG tablet Take 1 tablet (1 mg total) by mouth every 6 (six) hours as needed for anxiety. 15 tablet 0  . MAGNESIUM PO Take by mouth daily.    . Omega-3 Fatty Acids (FISH OIL PO)  Take by mouth daily.    . ondansetron (ZOFRAN ODT) 8 MG disintegrating tablet Take 1 tablet (8 mg total) by mouth every 8 (eight) hours as needed for nausea or vomiting. 60 tablet 0  . sucralfate (CARAFATE) 1 GM/10ML suspension Take 10 mLs (1 g total) by  mouth 4 (four) times daily -  with meals and at bedtime. 420 mL 0   No current facility-administered medications on file prior to visit.      Past Medical History:  Diagnosis Date  . Anxiety   . Depression   . Gallstones   . History of nephrolithiasis   . Kidney stone   . Migraine   . Pituitary tumor   . Vitamin D deficiency    Allergies  Allergen Reactions  . Metoclopramide Palpitations    Rapid heart rate Rapid heart rate Rapid heart rate Rapid heart rate Rapid heart rate Rapid heart rate Rapid heart rate  . Penicillins Hives, Other (See Comments) and Rash    Childhood reaction Has patient had a PCN reaction causing immediate rash, facial/tongue/throat swelling, SOB or lightheadedness with hypotension: No Has patient had a PCN reaction causing severe rash involving mucus membranes or skin necrosis: No Has patient had a PCN reaction that required hospitalization No Has patient had a PCN reaction occurring within the last 10 years: No If all of the above answers are "NO", then may proceed with Cephalosporin use.  Childhood reaction Has patient had a PCN reaction causing immediate rash, facial/tongue/throat swelling, SOB or lightheadedness with hypotension: No Has patient had a PCN reaction causing severe rash involving mucus membranes or skin necrosis: No Has patient had a PCN reaction that required hospitalization No Has patient had a PCN reaction occurring within the last 10 years: No If all of the above answers are "NO", then may proceed with Cephalosporin use.  Eduard Roux Other (See Comments)    Other reaction(s): Myalgias (intolerance) Constipation, hair loss  . Fluvoxamine Other (See Comments)    fainted Other reaction(s): Unknown fainted fainted  . Sumatriptan Palpitations  . Zoloft [Sertraline Hcl] Anxiety    Social History   Socioeconomic History  . Marital status: Married    Spouse name: Jonathon  . Number of children: 1  . Years of education:  Not on file  . Highest education level: Bachelor's degree (e.g., BA, AB, BS)  Occupational History  . 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 Financial resource strain: Not on file  . Food insecurity:    Worry: Not on file    Inability: Not on file  . Transportation needs:    Medical: Not on file    Non-medical: Not on file  Tobacco Use  . Smoking status: Never Smoker  . Smokeless tobacco: Never Used  Substance and Sexual Activity  . Alcohol use: Not Currently    Frequency: Never  . Drug use: No  . Sexual activity: Yes  Lifestyle  . Physical activity:    Days per week: Not on file    Minutes per session: Not on file  . Stress: Not on file  Relationships  . Social connections:    Talks on phone: Not on file    Gets together: Not on file    Attends religious service: Not on file    Active member of club or organization: Not on file    Attends meetings of clubs or organizations: Not on file    Relationship  status: Not on file  Other Topics Concern  . Not on file  Social History Narrative   Patient is right-handed. She lives with her husband and child in a split-level home.    Daughter born 01/2018   She avoids caffeine.    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    Vitals:   11/15/18 0941  BP: 124/83  Pulse: 85  Resp: 12  Temp: 98.3 F (36.8 C)  SpO2: 99%   Body mass index is 27.23 kg/m.   Physical Exam  Nursing note and vitals reviewed. Constitutional: She is oriented to person, place, and time. She appears well-developed. She does not appear ill. No distress.  HENT:  Head: Atraumatic.  Right Ear: Tympanic membrane, external ear and ear canal normal.  Left Ear: Tympanic membrane, external ear and ear canal normal.  Nose: Rhinorrhea present. Right sinus exhibits no maxillary sinus tenderness and no frontal sinus tenderness. Left sinus exhibits no maxillary sinus tenderness and no frontal  sinus tenderness.  Mouth/Throat: Oropharynx is clear and moist and mucous membranes are normal.  Mild hypertrophic turbinates. Postnasal drainage. Frequently clearing her throat during visit.  Eyes: Conjunctivae are normal.  Neck: No muscular tenderness present. No edema and no erythema present.  Cardiovascular: Normal rate and regular rhythm.  No murmur heard. Respiratory: Effort normal and breath sounds normal. No stridor. No respiratory distress.  Musculoskeletal:     Right shoulder: She exhibits normal range of motion and no tenderness.     Left shoulder: She exhibits normal range of motion and no tenderness.     Thoracic back: She exhibits spasm.       Back:     Comments: Tenderness upon palpation of interscapular area, bilateral. Mild muscle spasm,right side. Pain is not elicited hop on shoulders ROM.  Lymphadenopathy:       Head (right side): No submandibular adenopathy present.       Head (left side): No submandibular adenopathy present.    She has no cervical adenopathy.  Neurological: She is alert and oriented to person, place, and time. She has normal strength.  Skin: Skin is warm. No rash noted. No erythema.  Psychiatric: Her mood appears anxious.  Well groomed, good eye contact.    ASSESSMENT AND PLAN:  Ms. Aviv was seen today for shoulder blade pain and cough.  Diagnoses and all orders for this visit:  Upper back pain No Hx of trauma,so imagine was not recommended today. Local heat or Asper cream OTC. She prefers to hold on PT for now. Side effects of Flexeril discussed. F/U as needed.  -     cyclobenzaprine (FLEXERIL) 10 MG tablet; Take 1 tablet (10 mg total) by mouth at bedtime as needed for up to 30 days for muscle spasms.  URI, acute Symptoms suggests a viral etiology, symptomatic treatment recommended, I do not think abx is needed at this time. Instructed to monitor for signs of complications, including new onset of fever among some,instructed about  warning signs. I also explained that cough and nasal congestion can last a few days and sometimes weeks. F/U as needed.   Cough Lung auscultation normal, I do not think CXR is needed at this time. Adequate hydration and Honey may also help. Explained that cough can last a few more days and even weeks after acute symptoms are resolved.  -     benzonatate (TESSALON) 100 MG capsule; Take 2 capsules (200 mg total) by mouth 2 (two) times daily  as needed for up to 10 days.  Myalgia Upper extremities achy pain,?  Fibromyalgia, rheumatology evaluation is pending.  Other possible causes discussed, ? Radicular pain but there is not dermatomal distribution. Examination today does not suggest a serious process, I do not think blood work or imagine is needed. Instructed about warning signs.    Return if symptoms worsen or fail to improve.     Bladimir Auman G. Martin[place], MD  [facility]. [place] office.

## 2018-11-15 NOTE — Patient Instructions (Addendum)
A few things to remember from today's visit:   Upper back pain - Plan: cyclobenzaprine (FLEXERIL) 10 MG tablet  URI, acute  Cough - Plan: benzonatate (TESSALON) 100 MG capsule  Myalgia  viral infections are self-limited and we treat each symptom depending of severity.  Over the counter medications as decongestants and cold medications usually help, they need to be taken with caution if there is a history of high blood pressure or palpitations. Tylenol and/or Ibuprofen also helps with most symptoms (headache, muscle aching, fever,etc) Plenty of fluids. Honey helps with cough. Steam inhalations helps with runny nose, nasal congestion, and may prevent sinus infections. Cough and nasal congestion could last a few days and sometimes weeks. Please follow in not any better in 1-2 weeks or if symptoms get worse.  In regard to upper back pain, try over-the-counter IcyHot with lidocaine or Aspercreme, local ice may also help. If upper back pain is persistent, we can consider physical therapy.  Please be sure medication list is accurate. If a new problem present, please set up appointment sooner than planned today.

## 2018-11-22 ENCOUNTER — Other Ambulatory Visit: Payer: Self-pay

## 2018-11-22 ENCOUNTER — Encounter: Payer: 59 | Admitting: Nurse Practitioner

## 2018-11-22 NOTE — Progress Notes (Signed)
error Telephonic visit  Patient has given consent for a telephonic visit today and understands that is the same as is required for any face-to-face patient encounter except that the service was provided via telephone.   At the time of this telephonic visit the patient was located at  and I, the Provider, at the office of [facility].   Patient was referred to [facility] by(if referred)  No one other than  and I participated in this telephonic visit today.   Total time of telephonic visit :

## 2018-11-23 ENCOUNTER — Ambulatory Visit: Payer: 59 | Admitting: Physician Assistant

## 2018-11-23 NOTE — Telephone Encounter (Signed)
Brandy [name] from [facility] calling.  States they got the referral but can not process because they do not have notes/labs/demographic information/insurance. They can not precede without this. Please fax to [phone]

## 2018-11-24 ENCOUNTER — Other Ambulatory Visit: Payer: Self-pay

## 2018-11-24 ENCOUNTER — Telehealth (INDEPENDENT_AMBULATORY_CARE_PROVIDER_SITE_OTHER): Payer: 59 | Admitting: Physician Assistant

## 2018-11-24 DIAGNOSIS — K582 Mixed irritable bowel syndrome: Secondary | ICD-10-CM

## 2018-11-24 DIAGNOSIS — R1013 Epigastric pain: Secondary | ICD-10-CM

## 2018-11-24 DIAGNOSIS — K219 Gastro-esophageal reflux disease without esophagitis: Secondary | ICD-10-CM | POA: Diagnosis not present

## 2018-11-24 DIAGNOSIS — R112 Nausea with vomiting, unspecified: Secondary | ICD-10-CM

## 2018-11-24 DIAGNOSIS — R198 Other specified symptoms and signs involving the digestive system and abdomen: Secondary | ICD-10-CM

## 2018-11-24 MED ORDER — AMBULATORY NON FORMULARY MEDICATION: 1 refills | Status: DC

## 2018-11-24 MED ORDER — FAMOTIDINE 20 MG PO TABS: 40.0000 mg | ORAL_TABLET | Freq: Two times a day (BID) | ORAL | 2 refills | Status: DC

## 2018-11-24 MED ORDER — DICYCLOMINE HCL 10 MG PO CAPS: 10.0000 mg | ORAL_CAPSULE | Freq: Three times a day (TID) | ORAL | 3 refills | Status: DC

## 2018-11-24 MED ORDER — ONDANSETRON HCL 4 MG PO TABS: 4.0000 mg | ORAL_TABLET | ORAL | 4 refills | Status: DC | PRN

## 2018-11-24 NOTE — Telephone Encounter (Signed)
I refaxed  Again to  notes via release  :referral on [facility] again  [facility] ---- Attention [facility]  Address: [address], [place], [place] [address] Phone: [phone] Fax: [phone]

## 2018-11-24 NOTE — Progress Notes (Signed)
h pyl

## 2018-11-24 NOTE — Progress Notes (Signed)
TELEMEDICINE/VIRTUAL VISIT DUE TO COVID 19 CRISIS  Chief Complaint: Epigastric pain, GERD, nausea and vomiting, IBS and rectal pressure  HPI:    Brandy [name] is a 33 year old Caucasian female with a past medical history of anxiety and depression, known to Dr. Carlean Purl, who complains today of epigastric pain, reflux, nausea and vomiting,alternating constipation and diarrhea and rectal pressure.    Patient initially seen in September of last year by Dr. Carlean Purl, problems felt mostly functional.  Last visit 07/20/2018 with Alonza Bogus, PA-C.  At that time, she was asking to reschedule an EGD which was thought necessary by Dr. Carlean Purl back in September.  She described worsening epigastric pain and inability to take PPIs as her pain was worsened by them.  Also reported alternating between constipation and diarrhea.  At that time was again discussed that her symptoms were likely functional but EGD was again scheduled (this was never done due to family member becoming ill).  She was increased to Pepcid 40 mg twice daily.    Today, the patient explains that nothing has changed with her symptoms over the past 4 months, in fact she has continued with terrible stomach pain right when she wakes up in the morning, she describes this as being right above her bellybutton rated as a 9-10/10, it comes and goes in intensity throughout the day, worse with eating.  Also wakes up nauseous and will often vomit bile in the morning.  Currently taking Pepcid 20 mg twice daily, every morning and nightly and Carafate an hour before eating lunch and dinner.  She is aware not to take this medication around eating or other meds.  Describes using Zofran daily previously but has run out of this medication.  Again tells me she cannot take PPIs as these make her stomach hurt worse.  Has used GI cocktail in the ER which seems to help some too.    Along with this pain she is experiencing some "sharp pain in my intestines/spasms".  Along  with continued change in bowel habits alternating back-and-forth from constipation to diarrhea with no real pattern.     Continues to describe rectal pressure which is "every day but not all day long", has tried sitz bath's which do not make this any better.  Having a bowel movement does seem to relieve some of this pressure.  No sharp rectal pain, more of a pressure.    Social history positive for being the mother of a 82-year-old, symptoms all seem to start after pregnancy.    Denies fever, chills, weight loss, blood in her stool or symptoms that awaken her from sleep.  Past Medical History:  Diagnosis Date   Anxiety    Depression    Gallstones    History of nephrolithiasis    Kidney stone    Migraine    Pituitary tumor    Vitamin D deficiency     Past Surgical History:  Procedure Laterality Date   CHOLECYSTECTOMY  2014   KIDNEY STONE SURGERY     x 5   MOUTH SURGERY     TYMPANOSTOMY TUBE PLACEMENT      Current Outpatient Medications  Medication Sig Dispense Refill   acetaminophen (TYLENOL) 500 MG tablet Take 1,000 mg by mouth every 6 (six) hours as needed for headache.     Cholecalciferol (VITAMIN D) 2000 units CAPS Take 2,000 Units by mouth daily.     famotidine (PEPCID) 20 MG tablet Take 1 tablet (20 mg total) by mouth 2 (two) times  daily. 30 tablet 0   LORazepam (ATIVAN) 1 MG tablet Take 1 tablet (1 mg total) by mouth every 6 (six) hours as needed for anxiety. 15 tablet 0   ondansetron (ZOFRAN ODT) 8 MG disintegrating tablet Take 1 tablet (8 mg total) by mouth every 8 (eight) hours as needed for nausea or vomiting. 60 tablet 0   sucralfate (CARAFATE) 1 GM/10ML suspension Take 10 mLs (1 g total) by mouth 4 (four) times daily -  with meals and at bedtime. 420 mL 0   benzonatate (TESSALON) 100 MG capsule Take 2 capsules (200 mg total) by mouth 2 (two) times daily as needed for up to 10 days. (Patient not taking: Reported on 11/24/2018) 30 capsule 0    cyclobenzaprine (FLEXERIL) 10 MG tablet Take 1 tablet (10 mg total) by mouth at bedtime as needed for up to 30 days for muscle spasms. (Patient not taking: Reported on 11/24/2018) 30 tablet 0   dicyclomine (BENTYL) 10 MG capsule Take 1 capsule (10 mg total) by mouth 4 (four) times daily -  before meals and at bedtime. 120 capsule 3   escitalopram (LEXAPRO) 5 MG tablet Take 1 tablet (5 mg total) by mouth daily. (Patient not taking: Reported on 11/24/2018) 30 tablet 1   famotidine (PEPCID) 20 MG tablet Take 2 tablets (40 mg total) by mouth 2 (two) times daily. 120 tablet 2   MAGNESIUM PO Take by mouth daily.     Omega-3 Fatty Acids (FISH OIL PO) Take by mouth daily.     ondansetron (ZOFRAN) 4 MG tablet Take 1 tablet (4 mg total) by mouth every 4 (four) hours as needed for nausea or vomiting. 30 tablet 4   No current facility-administered medications for this visit.     Allergies as of 11/24/2018 - Review Complete 11/15/2018  Allergen Reaction Noted   Metoclopramide Palpitations 03/16/2018   Penicillins Hives, Other (See Comments), and Rash 07/18/2014   Erenumab-aooe Other (See Comments) 06/02/2018   Fluvoxamine Other (See Comments) 06/30/2018   Sumatriptan Palpitations 06/02/2018   Zoloft [sertraline hcl] Anxiety 04/08/2018    Family History  Problem Relation Age of Onset   Diabetes Mother    Mental illness Mother    Asthma Mother    Stomach cancer Mother    Colon polyps Mother    Heart disease Mother    Hypertension Mother    Arthritis Mother    Diabetes Father    Hypertension Father    Anxiety disorder Sister    Colon cancer Maternal Grandmother    Diabetes Paternal Grandmother    Breast cancer Maternal Aunt    Lung cancer Maternal Aunt    Diabetes Paternal Aunt    Diabetes Maternal Uncle    Diabetes Maternal Uncle     Social History   Socioeconomic History   Marital status: Married    Spouse name: Jonathon   Number of children: 1    Years of education: Not on file   Highest education level: Bachelor's degree (e.g., BA, AB, BS)  Occupational History   Occupation: Curator: Industry  Social Needs   Financial resource strain: Not on file   Food insecurity:    Worry: Not on file    Inability: Not on file   Transportation needs:    Medical: Not on file    Non-medical: Not on file  Tobacco Use   Smoking status: Never Smoker   Smokeless tobacco: Never Used  Substance and  Sexual Activity   Alcohol use: Not Currently    Frequency: Never   Drug use: No   Sexual activity: Yes  Lifestyle   Physical activity:    Days per week: Not on file    Minutes per session: Not on file   Stress: Not on file  Relationships   Social connections:    Talks on phone: Not on file    Gets together: Not on file    Attends religious service: Not on file    Active member of club or organization: Not on file    Attends meetings of clubs or organizations: Not on file    Relationship status: Not on file   Intimate partner violence:    Fear of current or ex partner: Not on file    Emotionally abused: Not on file    Physically abused: Not on file    Forced sexual activity: Not on file  Other Topics Concern   Not on file  Social History Narrative   Patient is right-handed. She lives with her husband and child in a split-level home.    Daughter born 01/2018   She avoids caffeine.    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    Review of Systems:    Constitutional: No weight loss, fever or chills Cardiovascular: No chest pain  Respiratory: No SOB  Gastrointestinal: See HPI and otherwise negative   Physical Exam: TELEMEDICINE/VIRTUAL VISIT-NO PHYSICAL EXAM   MOST RECENT LABS AND IMAGING: CBC    Component Value Date/Time   WBC 7.8 11/05/2018 1049   RBC 4.54 11/05/2018 1049   HGB 13.1 11/05/2018 1049   HGB 14.1 11/28/2012 1246   HCT 41.6 11/05/2018 1049    HCT 41.2 11/28/2012 1246   PLT 335 11/05/2018 1049   PLT 261 11/28/2012 1246   MCV 91.6 11/05/2018 1049   MCV 90 11/28/2012 1246   MCH 28.9 11/05/2018 1049   MCHC 31.5 11/05/2018 1049   RDW 13.2 11/05/2018 1049   RDW 12.4 11/28/2012 1246   LYMPHSABS 1.3 11/05/2018 1049   MONOABS 0.4 11/05/2018 1049   EOSABS 0.0 11/05/2018 1049   BASOSABS 0.0 11/05/2018 1049    CMP     Component Value Date/Time   NA 137 11/05/2018 1049   NA 138 11/28/2012 1246   K 3.6 11/05/2018 1049   K 3.8 11/28/2012 1246   CL 105 11/05/2018 1049   CL 104 11/28/2012 1246   CO2 24 11/05/2018 1049   CO2 26 11/28/2012 1246   GLUCOSE 114 (H) 11/05/2018 1049   GLUCOSE 104 (H) 11/28/2012 1246   BUN 12 11/05/2018 1049   BUN 11 11/28/2012 1246   CREATININE 0.87 11/05/2018 1049   CREATININE 0.83 11/28/2012 1246   CREATININE 0.86 06/03/2012 1657   CALCIUM 9.0 11/05/2018 1049   CALCIUM 9.4 11/28/2012 1246   PROT 7.5 11/05/2018 1049   PROT 8.2 11/28/2012 1246   ALBUMIN 4.2 11/05/2018 1049   ALBUMIN 4.3 11/28/2012 1246   AST 13 (L) 11/05/2018 1049   AST 13 (L) 11/28/2012 1246   ALT 11 11/05/2018 1049   ALT 20 11/28/2012 1246   ALKPHOS 50 11/05/2018 1049   ALKPHOS 58 11/28/2012 1246   BILITOT 0.5 11/05/2018 1049   BILITOT 0.4 11/28/2012 1246   GFRNONAA >60 11/05/2018 1049   GFRNONAA >60 11/28/2012 1246   GFRAA >60 11/05/2018 1049   GFRAA >60 11/28/2012 1246    Assessment: 1.  Epigastric pain: Severe per patient, continues,  no change from when she was seen in November; consider H. pylori +/- GERD +/- PUD versus other 2.  GERD: Continues per the patient, only moderately controlled on Pepcid 20 twice daily and Carafate daily 3.  Nausea and vomiting: Vomiting of bile, mostly in the morning, nausea mostly in the morning, consider relation to H. pylori versus gastritis versus GERD 4.  IBS with diarrhea and constipation: Continues to alternate back and forth 5.  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 Carlean Purl, now with 6 months of constipation +/- internal hemorrhoids  Plan: 1.  Prescribed Zofran 4 mg every 4-6 hours as needed for nausea #30 with 3 refills. 2.  Prescribed Pepcid 20 mg, 2 tabs twice daily #120 with 2 refills. (patient unable to tolerate PPI's per her) 3.  Prescribed Dicyclomine 10 mg 4 times daily, 20 to 30 minutes before meals and at bedtime #120 with 3 refills. 4.  We will prescribe GI cocktail for the patient to have been on hand 5 to 10 mL's every 4-6 hours as needed 1 bottle with a refill. 5.  Recommend patient discontinue Carafate as this is not helping and difficult for her to take. 6.  Would recommend the patient buy over-the-counter Preparation H suppositories for rectal pressure given possibility of hemorrhoids now that the patient has been battling constipation for over 6 months.  Use these twice daily for a week. 7.  Patient will need an EGD in the future.  Likely this will need to be scheduled after th COVID crisis as at the moment it is still nonemergent with chronic symptoms.  If patient does have a change in symptoms, worsening or other, than would recommend more emergent EGD. 8.  We will order H. pylori fecal test.  Patient aware to come to the lab between 730-5 PM. 9.  Patient will have follow-up visit in 4-6 weeks with Dr. Carlean Purl other APP.  This service was provided via telemedicine/virtual medicine.  The patient was located at home.  The provider was located in the office.  The patient did consent to this telephone visit and is aware of possible charges through their insurance for this visit.  There were no other persons participating in this telemedicine service.  Time spent on call: 25 min  Ellouise Newer, [facility] 11/24/2018, 11:00 AM  Cc: Martinique, Betty G, MD

## 2018-11-24 NOTE — Addendum Note (Signed)
Addended by: Candie Mile on: 11/24/2018 11:41 AM   Modules accepted: Orders

## 2018-11-24 NOTE — Patient Instructions (Signed)
I have prescribed Zofran 4 mg every 4-6 hours as needed for nausea #30 with 3 refills.  Also prescribed Dicyclomine 10 mg 4 times daily, 20 to 30 minutes before meals and at bedtime #120 with 3 refills.  Also prescribed Pepcid 20 mg, 2 tabs twice daily #120 with 3 refills.  Our office will also prescribe GI cocktail, 5-10 mL's p.o. every 4-6 hours as needed.  1 bottle with 1 refill.  Would recommend that you discontinue Carafate at this time.  Would recommend you buy over-the-counter Preparation H suppositories and insert twice daily, every morning and nightly for a week for rectal pressure to see if this helps.  Ordered H. pylori fecal antigen testing for you.  Please come to our lab which is located in the bottom of our building anytime between 7:30 AM-5 PM Monday through Friday.  Please do this at your earliest convenience within the next week.  Again you will likely need an EGD in the future.  We will put you in recall for this procedure as soon as we are able to get this done for you.  It will be with Dr. Carlean Purl in our outpatient endoscopy center.  We will arrange for a follow-up visit for you in the next 4-6 weeks with Dr. Carlean Purl or other app.  Please let us know if there is any change in your symptoms between now and then.  Thank you for allowing me to assist with your care today, Ellouise Newer, PA-C

## 2018-11-28 ENCOUNTER — Telehealth: Payer: Self-pay | Admitting: Family Medicine

## 2018-11-28 NOTE — Telephone Encounter (Signed)
I called pt( NO answer) I need clarification where to send her referral because [facility] stated they have not  received the notes . On [facility] she is scheduled for 12/19/2018 with [facility]

## 2018-11-30 ENCOUNTER — Telehealth: Payer: Self-pay | Admitting: Family Medicine

## 2018-11-30 NOTE — Telephone Encounter (Signed)
Notes were faxed again   Copied from [place] [id number]. Topic: Medical Record Request - Other >> Nov 28, 2018  7:17 AM Rayann Heman wrote: Kieth Brightly called from [place] left a voice mail that she is trying to get in touch with someone from the referral department. She states that she needs medical records for patient regarding a referral. Please advise [phone] Please put Attention [facility]

## 2018-12-02 ENCOUNTER — Telehealth: Payer: Self-pay | Admitting: Family Medicine

## 2018-12-02 NOTE — Telephone Encounter (Signed)
Faxed notes again to [facility] at [facility]

## 2018-12-05 ENCOUNTER — Encounter: Payer: Self-pay | Admitting: Physician Assistant

## 2018-12-05 ENCOUNTER — Ambulatory Visit (INDEPENDENT_AMBULATORY_CARE_PROVIDER_SITE_OTHER): Payer: 59 | Admitting: Physician Assistant

## 2018-12-05 DIAGNOSIS — F411 Generalized anxiety disorder: Secondary | ICD-10-CM

## 2018-12-05 NOTE — Progress Notes (Signed)
[facility] Med Check  Patient ID: Brandy [name],  MRN: [id number]  PCP: Martinique, Betty G, MD  Date of Evaluation: 12/05/2018 Time spent:15 minutes  Chief Complaint:  Chief Complaint    Follow-up     Virtual Visit via Telephone Note  I connected with Brandy [name] on 12/05/18 at  3:00 PM EDT by telephone and verified that I am speaking with the correct person using two identifiers.   I discussed the limitations, risks, security and privacy concerns of performing an evaluation and management service by telephone and the availability of in person appointments. I also discussed with the patient that there may be a patient responsible charge related to this service. The patient expressed understanding and agreed to proceed.    HISTORY/CURRENT STATUS: HPI for 4-week med check.  At the last visit 1 month ago, patient was started on Lexapro.  She did not take the medication.  "I am scared to.  I am really sensitive to medications and I am afraid to take this.  Also have been having a lot of trouble with my stomach and I did not want to take that along with something new from the gastroenterologist.  I felt like the medicine for my stomach was more important.  I am supposed to have an endoscopy done but because of the coronavirus, I cannot get it done yet."  States she is still very anxious.  Feels like something bad is going to happen to her.  "I know something is bad wrong with me.  Nobody can figure out what it is."  Even before the coronavirus pandemic kit, she was having extreme anxiety on a daily basis.  The Ativan has been helping but she feels that she needs more to be as effective as it used to be.  She is not usually taking over 1.5 mg/day.  She takes 1 every night to help her relax and go to sleep.  Patient denies loss of interest in usual activities and is able to enjoy things.  Denies decreased energy or motivation.  Appetite has not changed.  No extreme sadness, tearfulness, or  feelings of hopelessness.  Denies any changes in concentration, making decisions or remembering things.  Denies suicidal or homicidal thoughts.  Individual Medical History/ Review of Systems: Changes? :Yes Abdominal pain, indigestion, diarrhea all symptoms are sporadic.  Past medications for mental health diagnoses include: Prozac, Luvox, Elavil, Ativan, Xanax, Zoloft, Seroquel, Lithium for 1 pill, Buspar, Inderal, Cymbalta, NAC, Turmeric  Allergies: Metoclopramide; Penicillins; Erenumab-aooe; Fluvoxamine; Sumatriptan; and Zoloft [sertraline hcl]  Current Medications:  Current Outpatient Medications:  .  acetaminophen (TYLENOL) 500 MG tablet, Take 1,000 mg by mouth every 6 (six) hours as needed for headache., Disp: , Rfl:  .  AMBULATORY NON FORMULARY MEDICATION, GI Cocktail -  Take 5-10 ML 4-6 hours as needed.  90 mL Lidocaine 2 % 90 mL Dicyclomine 10 mg/5 ml 270 mL Maalox, Disp: 450 mL, Rfl: 1 .  Cholecalciferol (VITAMIN D) 2000 units CAPS, Take 2,000 Units by mouth daily., Disp: , Rfl:  .  famotidine (PEPCID) 20 MG tablet, Take 1 tablet (20 mg total) by mouth 2 (two) times daily., Disp: 30 tablet, Rfl: 0 .  famotidine (PEPCID) 20 MG tablet, Take 2 tablets (40 mg total) by mouth 2 (two) times daily., Disp: 120 tablet, Rfl: 2 .  LORazepam (ATIVAN) 1 MG tablet, Take 1 tablet (1 mg total) by mouth every 6 (six) hours as needed for anxiety., Disp: 15 tablet, Rfl: 0 .  Omega-3  Fatty Acids (FISH OIL PO), Take by mouth daily., Disp: , Rfl:  .  ondansetron (ZOFRAN) 4 MG tablet, Take 1 tablet (4 mg total) by mouth every 4 (four) hours as needed for nausea or vomiting., Disp: 30 tablet, Rfl: 4 .  cyclobenzaprine (FLEXERIL) 10 MG tablet, Take 1 tablet (10 mg total) by mouth at bedtime as needed for up to 30 days for muscle spasms. (Patient not taking: Reported on 11/24/2018), Disp: 30 tablet, Rfl: 0 .  dicyclomine (BENTYL) 10 MG capsule, Take 1 capsule (10 mg total) by mouth 4 (four) times daily -  before  meals and at bedtime. (Patient not taking: Reported on 12/05/2018), Disp: 120 capsule, Rfl: 3 .  escitalopram (LEXAPRO) 5 MG tablet, Take 1 tablet (5 mg total) by mouth daily. (Patient not taking: Reported on 11/24/2018), Disp: 30 tablet, Rfl: 1 .  MAGNESIUM PO, Take by mouth daily., Disp: , Rfl:  .  ondansetron (ZOFRAN ODT) 8 MG disintegrating tablet, Take 1 tablet (8 mg total) by mouth every 8 (eight) hours as needed for nausea or vomiting., Disp: 60 tablet, Rfl: 0 .  sucralfate (CARAFATE) 1 GM/10ML suspension, Take 10 mLs (1 g total) by mouth 4 (four) times daily -  with meals and at bedtime. (Patient not taking: Reported on 12/05/2018), Disp: 420 mL, Rfl: 0 Medication Side Effects: none  Family Medical/ Social History: Changes? No  MENTAL HEALTH EXAM:  not currently breastfeeding.There is no height or weight on file to calculate BMI.  General Appearance: Phone visit, unable to assess  Eye Contact:  Unable to assess  Speech:  Clear and Coherent  Volume:  Normal  Mood:  Anxious  Affect:  Unable to assess  Thought Process:  Goal Directed  Orientation:  Full (Time, Place, and Person)  Thought Content: Logical   Suicidal Thoughts:  No  Homicidal Thoughts:  No  Memory:  WNL  Judgement:  Good  Insight:  Good  Psychomotor Activity:  Unable to assess  Concentration:  Concentration: Good  Recall:  Good  Fund of Knowledge: Good  Language: Good  Assets:  Desire for Improvement  ADL's:  Intact  Cognition: WNL  Prognosis:  Good    DIAGNOSES:    ICD-10-CM   1. Generalized anxiety disorder F41.1     Receiving Psychotherapy: No    RECOMMENDATIONS: Encouraged her to take the Lexapro as directed.  She can do 2.5 mg p.o. daily for 1 week and then increase to 5 mg.  Hopefully this will prevent any side effects from happening.  She understands that this medication is to help prevent the anxiety, not just treat it.  And the Ativan that we are using is a rescue medication that she will become  tolerant to over time. Continue Ativan 1 mg every 6 hours as needed anxiety.  I told her she can take up to 3 p.o. daily.  If she runs out early, it will be fine to refill early. Recommend therapy. Return in 4 to 6 weeks.  Donnal Moat, PA-C   This record has been created using [employer].  Chart creation errors have been sought, but may not always have been located and corrected. Such creation errors do not reflect on the standard of medical care.

## 2018-12-19 ENCOUNTER — Encounter: Payer: Self-pay | Admitting: Family Medicine

## 2018-12-20 ENCOUNTER — Telehealth: Payer: Self-pay | Admitting: Family Medicine

## 2018-12-20 NOTE — Telephone Encounter (Signed)
pt went to [facility] rheumatology  Called pt states she wanted to go there  She was  seen on 11/2018/ at 9:00  Dr Amil Amen      Copied from [place] [id number]. Topic: Medical Record Request - Other >> Nov 28, 2018  7:17 AM Rayann Heman wrote: Kieth Brightly called from [place] left a voice mail that she is trying to get in touch with someone from the referral department. She states that she needs medical records for patient regarding a referral. Please advise [phone] Please put Attention [facility] >> Nov 30, 2018  3:20 PM Carney Bern wrote: Faxed notes

## 2018-12-22 NOTE — Telephone Encounter (Signed)
pt went to [facility] rheumatology  Called pt states she wanted to go there  Was seen on 11/2018/ at 9;   Dr Amil Amen

## 2018-12-26 ENCOUNTER — Telehealth: Payer: Self-pay

## 2018-12-26 NOTE — Telephone Encounter (Signed)
-----   Message from Gatha Mayer, MD sent at 12/26/2018 12:52 PM EDT ----- Regarding: appt Please ask her to make a f/u me as per Jennifer's last note

## 2018-12-26 NOTE — Telephone Encounter (Signed)
Left message for patient to call back  

## 2018-12-27 NOTE — Telephone Encounter (Signed)
Left message for patient to call back  

## 2018-12-28 NOTE — Telephone Encounter (Signed)
n answer/ no return calls.  I will mail her a letter

## 2018-12-30 ENCOUNTER — Telehealth: Payer: Self-pay | Admitting: Cardiology

## 2018-12-30 NOTE — Telephone Encounter (Signed)
Spoke with the pt. Pt sts that she has been having episodes of intermittent chest pain for several months. Recently the episodes have increased in frequency. Pt sts that the chest pain is also associated with pain in between her shoulder blades. Pt sts that the pain  is worst with exertion and improves with rest. She denies sob, n/v. She does have palpitations regularly. She denies near-syncope or syncope. No VS provided.  She has tried as much as 2,000mg  of Tylenol daily with not much improvement. Pt is currently asymptomatic. She would like to schedule an appt with Dr.Christopher.  Adv the pt that due to COVID-19 we are not scheduling routine office visits, but are scheduling virtual appts. Pt has access to smart devices and is agreeable with the virtual visit.   Adv the pt that I will fwd the message to Dr.Christopher's nurse Lars Mage, RN and scheduling to contact her to schedule. Adv the pt to contact the office in the interim if symptoms reoccur.

## 2018-12-30 NOTE — Telephone Encounter (Signed)

## 2018-12-30 NOTE — Telephone Encounter (Signed)
Would like to do a virtual visit or in office on DOD day?

## 2018-12-30 NOTE — Telephone Encounter (Signed)
New Message   Pt c/o of Chest Pain: STAT if CP now or developed within 24 hours  1. Are you having CP right now? no  2. Are you experiencing any other symptoms (ex. SOB, nausea, vomiting, sweating)? At the time of the call no symptoms. But she has been experiencing shortness of breath from time to time  3. How long have you been experiencing CP? For awhile but becoming more frequent  4. Is your CP continuous or coming and going? Coming and going   5. Have you taken Nitroglycerin? No, patient does not have.     Patient states chest pain comes and go but it has increased and gotten a little worse. She has sob periodically and has notice pain in between her shoulder blades as well as feeling like her heart is skipping a beat. ?

## 2018-12-30 NOTE — Telephone Encounter (Signed)
I think virtual is ok since we have seen her before/she has a workup. Thanks!

## 2019-01-02 ENCOUNTER — Telehealth: Payer: Self-pay | Admitting: Cardiology

## 2019-01-02 NOTE — Telephone Encounter (Signed)
Smartphone/consent/ my chart/ pre reg completed °

## 2019-01-03 ENCOUNTER — Encounter: Payer: Self-pay | Admitting: Cardiology

## 2019-01-03 ENCOUNTER — Telehealth (INDEPENDENT_AMBULATORY_CARE_PROVIDER_SITE_OTHER): Payer: 59 | Admitting: Cardiology

## 2019-01-03 ENCOUNTER — Encounter: Payer: Self-pay | Admitting: *Deleted

## 2019-01-03 ENCOUNTER — Telehealth: Payer: Self-pay | Admitting: *Deleted

## 2019-01-03 VITALS — BP 112/78 | HR 77 | Ht 69.0 in | Wt 188.0 lb

## 2019-01-03 DIAGNOSIS — R072 Precordial pain: Secondary | ICD-10-CM

## 2019-01-03 DIAGNOSIS — R0602 Shortness of breath: Secondary | ICD-10-CM

## 2019-01-03 DIAGNOSIS — Z7189 Other specified counseling: Secondary | ICD-10-CM

## 2019-01-03 DIAGNOSIS — R002 Palpitations: Secondary | ICD-10-CM | POA: Diagnosis not present

## 2019-01-03 NOTE — Telephone Encounter (Signed)
   Cardiac Questionnaire:    Since your last visit or hospitalization:    1. Have you been having new or worsening chest pain? YES   2. Have you been having new or worsening shortness of breath? YES 3. Have you been having new or worsening leg swelling, wt gain, or increase in abdominal girth (pants fitting more tightly)? NO   4. Have you had any passing out spells? NO    *A YES to any of these questions would result in the appointment being kept. *If all the answers to these questions are NO, we should indicate that given the current situation regarding the worldwide coronarvirus pandemic, at the recommendation of the CDC, we are looking to limit gatherings in our waiting area, and thus will reschedule their appointment beyond four weeks from today.   _____________   [id number] Pre-Screening Questions:  . Do you currently have a fever? NO . Have you recently travelled on a cruise, internationally, or to [place], [place], [place], [place], [place], or [place], [place] [employer])? NO . Have you been in contact with someone that is currently pending confirmation of Covid19 testing or has been confirmed to have the [place] virus?  NO . Are you currently experiencing fatigue or cough? NO

## 2019-01-03 NOTE — Progress Notes (Signed)
Virtual Visit via Video Note   This visit type was conducted due to national recommendations for restrictions regarding the COVID-19 Pandemic (e.g. social distancing) in an effort to limit this patient's exposure and mitigate transmission in our community.  Due to her co-morbid illnesses, this patient is at least at moderate risk for complications without adequate follow up.  This format is felt to be most appropriate for this patient at this time.  All issues noted in this document were discussed and addressed.  A limited physical exam was performed with this format.  Please refer to the patient's chart for her consent to telehealth for [facility].   Date:  01/03/2019   ID:  Brandy [name], DOB June 13, 1986, MRN [id number]  Patient Location: Home Provider Location: Home  PCP:  [place], Betty G, MD  Cardiologist:  Buford Dresser, MD  Electrophysiologist:  None   Evaluation Performed:  Follow-Up Visit  Chief Complaint:  Short of breath, chest pain, palpitations.  History of Present Illness:    Brandy [name] is a 33 y.o. female with history of anxiety/postpartum depression who has been followed by me for palpitations, shortness of breath, and chest tightness.   The patient does not have symptoms concerning for COVID-19 infection (fever, chills, cough, or new shortness of breath).   She is very concerned and anxious as she continues to have symptoms that are unexplained.   This AM, had an episode of chest pain, heart rate got up to 130 bpm, felt short of breath. Having these episodes 2-3 times/week. Somewhat better at rest, but sometimes wakes her up from sleep. Exhausted, can't sleep at night, fatigue is extreme. Blood pressures have been well controlled to low, 96/70 earlier today. Feels very anxious, very stressed, affecting her job, her home life, etc.  PCP has referred her to neurology and rheumatology in the past, but she feels that no one can tell her what is wrong. She  feels overwhelmed and doesn't know what to do.   We spent extensive time reviewing the results of her prior testing, her overall cardiovascular risk, the possible etiology of her symptoms, the difference between primary and secondary cardiac symptoms (what is intrinsically coming from the heart and what is coming from other signals from the body).  Past Medical History:  Diagnosis Date  . Anxiety   . Depression   . Gallstones   . History of nephrolithiasis   . Kidney stone   . Migraine   . Pituitary tumor   . Vitamin D deficiency    Past Surgical History:  Procedure Laterality Date  . CHOLECYSTECTOMY  2014  . KIDNEY STONE SURGERY     x 5  . MOUTH SURGERY    . TYMPANOSTOMY TUBE PLACEMENT       Current Meds  Medication Sig  . acetaminophen (TYLENOL) 500 MG tablet Take 1,000 mg by mouth every 6 (six) hours as needed for headache.  . AMBULATORY NON FORMULARY MEDICATION GI Cocktail -  Take 5-10 ML 4-6 hours as needed.  90 mL Lidocaine 2 % 90 mL Dicyclomine 10 mg/5 ml 270 mL Maalox  . Cholecalciferol (VITAMIN D) 2000 units CAPS Take 2,000 Units by mouth daily.  . Coenzyme Q10 (CO Q-10) 50 MG CAPS Take 50 mg by mouth daily.  Marland Kitchen LORazepam (ATIVAN) 1 MG tablet Take 1 tablet (1 mg total) by mouth every 6 (six) hours as needed for anxiety.  Marland Kitchen MAGNESIUM PO Take by mouth daily.  . Melatonin 10 MG TABS Take 10  mg by mouth daily.  . mirtazapine (REMERON) 7.5 MG tablet Take 7.5 mg by mouth at bedtime.  . Misc Natural Products (TART CHERRY ADVANCED PO) Take 1,000 mg by mouth daily.  . Omega-3 Fatty Acids (FISH OIL PO) Take by mouth daily.  . ondansetron (ZOFRAN ODT) 8 MG disintegrating tablet Take 1 tablet (8 mg total) by mouth every 8 (eight) hours as needed for nausea or vomiting.  . vitamin B-12 (CYANOCOBALAMIN) 500 MCG tablet Take 500 mcg by mouth daily.  . [DISCONTINUED] dicyclomine (BENTYL) 10 MG capsule Take 1 capsule (10 mg total) by mouth 4 (four) times daily -  before meals and at  bedtime.  . [DISCONTINUED] escitalopram (LEXAPRO) 5 MG tablet Take 1 tablet (5 mg total) by mouth daily.  . [DISCONTINUED] famotidine (PEPCID) 20 MG tablet Take 1 tablet (20 mg total) by mouth 2 (two) times daily.  . [DISCONTINUED] famotidine (PEPCID) 20 MG tablet Take 2 tablets (40 mg total) by mouth 2 (two) times daily.  . [DISCONTINUED] ondansetron (ZOFRAN) 4 MG tablet Take 1 tablet (4 mg total) by mouth every 4 (four) hours as needed for nausea or vomiting.  . [DISCONTINUED] sucralfate (CARAFATE) 1 GM/10ML suspension Take 10 mLs (1 g total) by mouth 4 (four) times daily -  with meals and at bedtime.     Allergies:   Metoclopramide; Penicillins; Erenumab-aooe; Fluvoxamine; Sumatriptan; and Zoloft [sertraline hcl]   Social History   Tobacco Use  . Smoking status: Never Smoker  . Smokeless tobacco: Never Used  Substance Use Topics  . Alcohol use: Not Currently    Frequency: Never  . Drug use: No     Family Hx: The patient's family history includes Anxiety disorder in her sister; Arthritis in her mother; Asthma in her mother; Breast cancer in her maternal aunt; Colon cancer in her maternal grandmother; Colon polyps in her mother; Diabetes in her father, maternal uncle, maternal uncle, mother, paternal aunt, and paternal grandmother; Heart disease in her mother; Hypertension in her father and mother; Lung cancer in her maternal aunt; Mental illness in her mother; Stomach cancer in her mother.  ROS:   Please see the history of present illness.    Constitutional: Negative for chills, fever, night sweats, unintentional weight loss  HENT: Negative for ear pain and hearing loss.   Eyes: Negative for loss of vision and eye pain.  Respiratory: Negative for cough, sputum, wheezing.   Cardiovascular: See HPI. Gastrointestinal: Chronic GI symptoms, being followed for this Genitourinary: Negative for dysuria and hematuria.  Musculoskeletal: Negative for falls and myalgias.  Skin: Negative for  itching and rash.  Neurological: Negative for focal weakness, focal sensory changes and loss of consciousness. Positive for chronic headaches, chronic pain across multiple regions of her body. Endo/Heme/Allergies: Does not bruise/bleed easily.  All other systems reviewed and are negative.   Prior CV studies:   The following studies were reviewed today: All of her prior studies, including echo, treadmill stress test, multiple monitors  Labs/Other Tests and Data Reviewed:    EKG:  An ECG dated 05/31/18 was personally reviewed today and demonstrated:  SR with sinus arrhythmia  Recent Labs: 03/25/2018: B Natriuretic Peptide 15.0 06/14/2018: TSH 0.58 11/05/2018: ALT 11; BUN 12; Creatinine, Ser 0.87; Hemoglobin 13.1; Platelets 335; Potassium 3.6; Sodium 137   Recent Lipid Panel Lab Results  Component Value Date/Time   CHOL 196 05/31/2018 10:23 AM   TRIG 76 05/31/2018 10:23 AM   HDL 53 05/31/2018 10:23 AM   CHOLHDL 3.7 05/31/2018  10:23 AM   CHOLHDL 3 09/24/2014 08:37 AM   LDLCALC 128 (H) 05/31/2018 10:23 AM    Wt Readings from Last 3 Encounters:  01/03/19 188 lb (85.3 kg)  11/15/18 184 lb 6 oz (83.6 kg)  11/05/18 175 lb (79.4 kg)     Objective:    Vital Signs:  BP 112/78   Pulse 77   Ht 5\' 9"  (1.753 m)   Wt 188 lb (85.3 kg)   BMI 27.76 kg/m    VITAL SIGNS:  reviewed GEN:  no acute distress EYES:  sclerae anicteric, EOMI - Extraocular Movements Intact RESPIRATORY:  normal respiratory effort, symmetric expansion CARDIOVASCULAR:  no visible JVD SKIN:  no rash, lesions or ulcers. MUSCULOSKELETAL:  no obvious deformities. NEURO:  alert and oriented x 3, no obvious focal deficit PSYCH:  normal affect  ASSESSMENT & PLAN:    Chest pain, palpitations, shortness of breath: Please see my prior notes, most recently 09/05/2018, for full summary of her workup.  We spent significant time discussing her prior testing and the lack of evidence for any intrinsic cardiac process. Her  workup has been unrevealing, and with her lack of risk factors and low pretest probability, the utility of repeating the workup is exceedingly low.  We spent time today discussing how the heart is integrated with the rest of the body and responds to the body's signals.  She is already following with her primary care and GI to work up some of her symptoms. She has been seen by neurology and rheumatology in the past.  My heart breaks for what she is going through, and I offered my support. However, I do not think it is a primarily cardiac issues, and I counseled her on this.   I know she is followed by psychiatry, but I wonder if behavioral medicine may also be helpful. She has tried acupuncture and holistic medicine already.  I am always available if her symptoms change, and I will continue to see if anyone has recommendations for specialists in the complex pain syndrome arena. I also encouraged her to reach out to her insurance company to see if they partner with any services that offer remote expert second opinions.    I am always happy to help if I can. She can continue to follow as needed with cardiology.  COVID-19 Education: The signs and symptoms of COVID-19 were discussed with the patient and how to seek care for testing (follow up with PCP or arrange E-visit).  The importance of social distancing was discussed today.  Time:   Today, I have spent 33 minutes with the patient with telehealth technology discussing the above problems.    Patient Instructions  Medication Instructions:  Your Physician recommend you continue on your current medication as directed.    If you need a refill on your cardiac medications before your next appointment, please call your pharmacy.   Lab work: None  Testing/Procedures: None  Follow-Up: . Your physician recommends that you schedule a follow-up appointment as needed with Dr. Harrell Gave.       Medication Adjustments/Labs and Tests Ordered:  Current medicines are reviewed at length with the patient today.  Concerns regarding medicines are outlined above.   Tests Ordered: No orders of the defined types were placed in this encounter.   Medication Changes: No orders of the defined types were placed in this encounter.   Disposition:  Follow up as needed  Signed, Buford Dresser, MD  01/03/2019 11:19 PM    [facility]

## 2019-01-03 NOTE — Patient Instructions (Signed)
Medication Instructions:  Your Physician recommend you continue on your current medication as directed.    If you need a refill on your cardiac medications before your next appointment, please call your pharmacy.   Lab work: None  Testing/Procedures: None  Follow-Up: Your physician recommends that you schedule a follow-up appointment as needed with Dr. Christopher.     

## 2019-01-11 ENCOUNTER — Encounter: Payer: Self-pay | Admitting: Physician Assistant

## 2019-01-11 ENCOUNTER — Other Ambulatory Visit: Payer: Self-pay

## 2019-01-11 ENCOUNTER — Ambulatory Visit (INDEPENDENT_AMBULATORY_CARE_PROVIDER_SITE_OTHER): Payer: 59 | Admitting: Physician Assistant

## 2019-01-11 ENCOUNTER — Telehealth: Payer: Self-pay | Admitting: Neurology

## 2019-01-11 DIAGNOSIS — O99345 Other mental disorders complicating the puerperium: Secondary | ICD-10-CM

## 2019-01-11 DIAGNOSIS — F411 Generalized anxiety disorder: Secondary | ICD-10-CM | POA: Diagnosis not present

## 2019-01-11 DIAGNOSIS — F53 Postpartum depression: Secondary | ICD-10-CM | POA: Diagnosis not present

## 2019-01-11 MED ORDER — MIRTAZAPINE 7.5 MG PO TABS: 7.5000 mg | ORAL_TABLET | Freq: Every day | ORAL | 2 refills | Status: DC

## 2019-01-11 MED ORDER — DULOXETINE HCL 20 MG PO CPEP: 20.0000 mg | ORAL_CAPSULE | Freq: Every day | ORAL | 1 refills | Status: DC

## 2019-01-11 NOTE — Telephone Encounter (Signed)
Yes

## 2019-01-11 NOTE — Progress Notes (Signed)
[facility] Med Check  Patient ID: Brandy [name],  MRN: [id number]  PCP: [place], Betty G, MD  Date of Evaluation: 01/11/2019 Time spent:15 minutes  Chief Complaint:  Chief Complaint    Panic Attack; Anxiety; Follow-up     Virtual Visit via Telephone Note  I connected with patient by a video enabled telemedicine application or telephone, with their informed consent, and verified patient privacy and that I am speaking with the correct person using two identifiers.  I am private, at [place] and the patient is home.   I discussed the limitations, risks, security and privacy concerns of performing an evaluation and management service by telephone and the availability of in person appointments. I also discussed with the patient that there may be a patient responsible charge related to this service. The patient expressed understanding and agreed to proceed.   I discussed the assessment and treatment plan with the patient. The patient was provided an opportunity to ask questions and all were answered. The patient agreed with the plan and demonstrated an understanding of the instructions.   The patient was advised to call back or seek an in-person evaluation if the symptoms worsen or if the condition fails to improve as anticipated.  I provided 15 minutes of non-face-to-face time during this encounter.  HISTORY/CURRENT STATUS: HPI  For 6 weeks med check.  Has started back on Remeron.  Only taking one half of 7.5.  When she is tried to go up to 15 mg, she had more anxiety.  It has helped sleep and she's a little less anxious. She tried the Lexapro for 3 days but it increased the anxiety so she stopped it.  She is not needing the Ativan as much since she has been taking the Remeron, but still has to take it at least once a day.  Panic attacks are not quite as often or severe but are still present several times a week.  She does report nightmares from the Remeron and she is also had 10 pounds  of weight gain in 1 week.  "It is a small price to pay to help me sleep though.  I am willing to do it."  Patient asks about Cymbalta.  States she has been diagnosed with fibromyalgia.  "That is all they can tell me that I have because they cannot figure out anything else."  Patient denies loss of interest in usual activities and is able to enjoy things.  Denies decreased energy or motivation.  Appetite has not changed.  No extreme sadness, tearfulness, or feelings of hopelessness.  Denies any changes in concentration, making decisions or remembering things.  Denies suicidal or homicidal thoughts.  Denies dizziness, syncope, seizures, numbness, tingling, tremor, tics, unsteady gait, slurred speech, confusion.  Denies muscle or joint pain, stiffness, or dystonia.  Individual Medical History/ Review of Systems: Changes? :No   Allergies: Metoclopramide; Penicillins; Erenumab-aooe; Fluvoxamine; Sumatriptan; and Zoloft [sertraline hcl]  Current Medications:  Current Outpatient Medications:  .  acetaminophen (TYLENOL) 500 MG tablet, Take 1,000 mg by mouth every 6 (six) hours as needed for headache., Disp: , Rfl:  .  AMBULATORY NON FORMULARY MEDICATION, GI Cocktail -  Take 5-10 ML 4-6 hours as needed.  90 mL Lidocaine 2 % 90 mL Dicyclomine 10 mg/5 ml 270 mL Maalox, Disp: 450 mL, Rfl: 1 .  Cholecalciferol (VITAMIN D) 2000 units CAPS, Take 2,000 Units by mouth daily., Disp: , Rfl:  .  Coenzyme Q10 (CO Q-10) 50 MG CAPS, Take 50 mg by mouth  daily., Disp: , Rfl:  .  LORazepam (ATIVAN) 1 MG tablet, Take 1 tablet (1 mg total) by mouth every 6 (six) hours as needed for anxiety., Disp: 15 tablet, Rfl: 0 .  MAGNESIUM PO, Take by mouth daily., Disp: , Rfl:  .  Melatonin 10 MG TABS, Take 10 mg by mouth daily., Disp: , Rfl:  .  mirtazapine (REMERON) 7.5 MG tablet, Take 1 tablet (7.5 mg total) by mouth at bedtime., Disp: 30 tablet, Rfl: 2 .  Misc Natural Products (TART CHERRY ADVANCED PO), Take 1,000 mg by mouth  daily., Disp: , Rfl:  .  Omega-3 Fatty Acids (FISH OIL PO), Take by mouth daily., Disp: , Rfl:  .  ondansetron (ZOFRAN ODT) 8 MG disintegrating tablet, Take 1 tablet (8 mg total) by mouth every 8 (eight) hours as needed for nausea or vomiting., Disp: 60 tablet, Rfl: 0 .  vitamin B-12 (CYANOCOBALAMIN) 500 MCG tablet, Take 500 mcg by mouth daily., Disp: , Rfl:  .  DULoxetine (CYMBALTA) 20 MG capsule, Take 1 capsule (20 mg total) by mouth daily., Disp: 30 capsule, Rfl: 1 Medication Side Effects: weight gain  Family Medical/ Social History: Changes? No  MENTAL HEALTH EXAM:  not currently breastfeeding.There is no height or weight on file to calculate BMI.  General Appearance: unable to assess  Eye Contact:  unable to assess  Speech:  Clear and Coherent  Volume:  Normal  Mood:  Anxious  Affect:  unable to assess  Thought Process:  Goal Directed  Orientation:  Full (Time, Place, and Person)  Thought Content: Logical   Suicidal Thoughts:  No  Homicidal Thoughts:  No  Memory:  WNL  Judgement:  Good  Insight:  Good  Psychomotor Activity:  unable to assess  Concentration:  Concentration: Good  Recall:  Good  Fund of Knowledge: Good  Language: Good  Assets:  Desire for Improvement  ADL's:  Intact  Cognition: WNL  Prognosis:  Good    DIAGNOSES:    ICD-10-CM   1. Generalized anxiety disorder F41.1   2. 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 every morning.  If she is tolerating this well, call in 2 weeks and we will increase either to 30 mg or 40 mg.  We discussed benefits, side effects, risks, and patient accepts. Continue mirtazapine 7.5 mg, one half p.o. nightly as needed. Continue Ativan 1 mg every 6 hours as needed. Return in 4 weeks.  Donnal Moat, PA-C   This record has been created using [employer].  Chart creation errors have been sought, but may not always have been located and  corrected. Such creation errors do not reflect on the standard of medical care.

## 2019-01-11 NOTE — Telephone Encounter (Signed)
Patient left msg with after hours about wanting to make an appt. Can she do an Evisit? Thanks!

## 2019-01-16 NOTE — Progress Notes (Signed)
Virtual Visit via Video Note The purpose of this virtual visit is to provide medical care while limiting exposure to the novel coronavirus.    Consent was obtained for video visit:  Yes.   Answered questions that patient had about telehealth interaction:  Yes.   I discussed the limitations, risks, security and privacy concerns of performing an evaluation and management service by telemedicine. I also discussed with the patient that there may be a patient responsible charge related to this service. The patient expressed understanding and agreed to proceed.  Pt location: Home Physician Location: office Name of referring provider:  Martinique, Betty G, MD I connected with Brandy [name] at patients initiation/request on 01/17/2019 at  9:10 AM EDT by video enabled telemedicine application and verified that I am speaking with the correct person using two identifiers. Pt MRN:  [id number] Pt DOB:  [birth year] Video Participants:  Brandy [name]   History of Present Illness:  Brandy [name] is a 33 year old right-handed woman with depression, anxiety, pituitary tumor and history of kidney stones who follows up for migraines and worsening myalgia and now muscle twitching.  UPDATE: Migraines have resolved.  She developed myalgias after the first dose of Aimovig in August 2019.  She never continued the Aimovig after the first dose and symptoms persist.  ANA, CRP, TSH, B12, K+, CRP, CK and aldolase were unremarkable.  However, she started experiencing muscle twitches on and off for 2-3 months.  No cramps.  It involves the face, back, arms, legs and mostly in thighs.  She also reports increased weakness in the arms and legs.  Pain is worse, described as an aching but not cramping.  Any activity aggravates it.  She feels fatigue in general.  Denies difficulty speaking, swallowing, breathing or change in bowel or bladder control.  She is sleeping well since starting Remeron.  She reports anxiety due to her  symptoms.   No family history of ALS.   HISTORY: Onset:April 2019, during first trimester of first pregnancy. 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 Eye exam was normal. Labs/hormones were unremarkable. Repeat imaging since birth of her child has shown that it has decreased in size. MRA of head and neck from 01/05/18 personally reviewed and was negative. MRI of brain with and without contrast from 03/07/18 was personally reviewed and demonstrated known pituitary adenoma but otherwise no acute process. MRV of head performed the same day was negative for dural sinus thrombosis.  MRI of brain with and without contrast from 06/17/18 showed no acute findings associated with intracranial hypotension.  41mm pituitary gland stable and stated to be within normal limits for age and gender.  MRI of cervical spine with and without contrast showed no abnormal fluid collection or enhancement.  Therefore, CSF leak was no longer suspected and she did not receive a blood patch. Location:Left sided, left retro-orbital/temporal/occipital, but now bilateral Quality:Pressure, "weird sensations throughout head" Intensity:Severe.Shedenies thunderclap headache. Aura:no Prodrome:no Postdrome:no Associated symptoms:Nausea, vomiting, bilateral eye lacrimation.Shedenies associated, photophobia, phonophobia,unilateral numbness or weakness. Duration:Often wakes up with it. Off and on throughout the day. Frequency:daily Frequency of abortive medication:none Triggers:no Exacerbating factors:Standing up, exertion Relieving factors:Laying down Activity:aggravates  Past NSAIDS:Ibuprofen, naproxen, Cambia Past analgesics:Fioricet (ineffective), Excedrin Migraine, Tylenol, Ultracet Past abortive triptans:Sumatriptan tablet (made her feel awful, palpitations) Past abortive ergotamine:no Past  muscle relaxants:no Past anti-emetic:no Past antihypertensive medications:no Past antidepressant medications:fluoxetine, sertraline, Effexor Past anticonvulsant medications: lamogrigine Past anti-CGRP:Aimovig  70mg  (reported various somatic complaints following first dose such as nausea, constipation, arthralgias, myalgias, pain, hair loss and fatigue. She never continued the Aimovig after the first dose and symptoms persist.  ANA, TSH, B12, CRP, CK and aldolase were unremarkable) Past vitamins/Herbal/Supplements:no Past antihistamines/decongestants:no Other past therapies:none  Family history of headache:no   Past Medical History: Past Medical History:  Diagnosis Date  . Anxiety   . Depression   . Gallstones   . History of nephrolithiasis   . Kidney stone   . Migraine   . Pituitary tumor   . Vitamin D deficiency     Medications: Outpatient Encounter Medications as of 01/17/2019  Medication Sig  . acetaminophen (TYLENOL) 500 MG tablet Take 1,000 mg by mouth every 6 (six) hours as needed for headache.  . AMBULATORY NON FORMULARY MEDICATION GI Cocktail -  Take 5-10 ML 4-6 hours as needed.  90 mL Lidocaine 2 % 90 mL Dicyclomine 10 mg/5 ml 270 mL Maalox  . Cholecalciferol (VITAMIN D) 2000 units CAPS Take 2,000 Units by mouth daily.  . Coenzyme Q10 (CO Q-10) 50 MG CAPS Take 50 mg by mouth daily.  . DULoxetine (CYMBALTA) 20 MG capsule Take 1 capsule (20 mg total) by mouth daily.  Marland Kitchen LORazepam (ATIVAN) 1 MG tablet Take 1 tablet (1 mg total) by mouth every 6 (six) hours as needed for anxiety.  Marland Kitchen MAGNESIUM PO Take by mouth daily.  . Melatonin 10 MG TABS Take 10 mg by mouth daily.  . mirtazapine (REMERON) 7.5 MG tablet Take 1 tablet (7.5 mg total) by mouth at bedtime.  . Misc Natural Products (TART CHERRY ADVANCED PO) Take 1,000 mg by mouth daily.  . Omega-3 Fatty Acids (FISH OIL PO) Take by mouth daily.  . ondansetron (ZOFRAN ODT) 8 MG disintegrating tablet Take 1  tablet (8 mg total) by mouth every 8 (eight) hours as needed for nausea or vomiting.  . vitamin B-12 (CYANOCOBALAMIN) 500 MCG tablet Take 500 mcg by mouth daily.   No facility-administered encounter medications on file as of 01/17/2019.     Allergies: Allergies  Allergen Reactions  . Metoclopramide Palpitations    Rapid heart rate Rapid heart rate Rapid heart rate Rapid heart rate Rapid heart rate Rapid heart rate Rapid heart rate  . Penicillins Hives, Other (See Comments) and Rash    Childhood reaction Has patient had a PCN reaction causing immediate rash, facial/tongue/throat swelling, SOB or lightheadedness with hypotension: No Has patient had a PCN reaction causing severe rash involving mucus membranes or skin necrosis: No Has patient had a PCN reaction that required hospitalization No Has patient had a PCN reaction occurring within the last 10 years: No If all of the above answers are "NO", then may proceed with Cephalosporin use.  Childhood reaction Has patient had a PCN reaction causing immediate rash, facial/tongue/throat swelling, SOB or lightheadedness with hypotension: No Has patient had a PCN reaction causing severe rash involving mucus membranes or skin necrosis: No Has patient had a PCN reaction that required hospitalization No Has patient had a PCN reaction occurring within the last 10 years: No If all of the above answers are "NO", then may proceed with Cephalosporin use.  Eduard Roux Other (See Comments)    Other reaction(s): Myalgias (intolerance) Constipation, hair loss  . Fluvoxamine Other (See Comments)    fainted Other reaction(s): Unknown fainted fainted  . Sumatriptan Palpitations  . Zoloft [Sertraline Hcl] Anxiety    Family History: Family History  Problem Relation Age of Onset  .  Diabetes Mother   . Mental illness Mother   . Asthma Mother   . Stomach cancer Mother   . Colon polyps Mother   . Heart disease Mother   . Hypertension Mother    . Arthritis Mother   . Diabetes Father   . Hypertension Father   . Anxiety disorder Sister   . Colon cancer Maternal Grandmother   . Diabetes Paternal Grandmother   . Breast cancer Maternal Aunt   . Lung cancer Maternal Aunt   . Diabetes Paternal Aunt   . Diabetes Maternal Uncle   . Diabetes Maternal Uncle     Social History: Social History   Socioeconomic History  . Marital status: Married    Spouse name: Jonathon  . Number of children: 1  . Years of education: Not on file  . Highest education level: Bachelor's degree (e.g., BA, AB, BS)  Occupational History  . 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 Financial resource strain: Not on file  . Food insecurity:    Worry: Not on file    Inability: Not on file  . Transportation needs:    Medical: Not on file    Non-medical: Not on file  Tobacco Use  . Smoking status: Never Smoker  . Smokeless tobacco: Never Used  Substance and Sexual Activity  . Alcohol use: Not Currently    Frequency: Never  . Drug use: No  . Sexual activity: Yes  Lifestyle  . Physical activity:    Days per week: Not on file    Minutes per session: Not on file  . Stress: Not on file  Relationships  . Social connections:    Talks on phone: Not on file    Gets together: Not on file    Attends religious service: Not on file    Active member of club or organization: Not on file    Attends meetings of clubs or organizations: Not on file    Relationship status: Not on file  . Intimate partner violence:    Fear of current or ex partner: Not on file    Emotionally abused: Not on file    Physically abused: Not on file    Forced sexual activity: Not on file  Other Topics Concern  . Not on file  Social History Narrative   Patient is right-handed. She lives with her husband and child in a split-level home.    Daughter born 01/2018   She avoids caffeine.    No EtOH, no drugs, tobacco   N[employer]  Med tech [place]   Observations/Objective:   Height 5\' 9"  (1.753 m), weight 185 lb (83.9 kg), not currently breastfeeding. No acute distress.  Alert and oriented.  Speech fluent and not dysarthric.  Language intact.  Eyes orthophoric on primary gaze.  Face symmetric.  Assessment and Plan:   1.  Muscle twitches and myalgias 2.  Migraine without aura, without status migrainosus, not intractable, resolved  To further evaluate muscle twitching and myalgias: 1.  Repeat CK, CMP and will also check Mg 2.  NCV-EMG 3.  Further recommendations pending results.  Follow Up Instructions:    -I discussed the assessment and treatment plan with the patient. The patient was provided an opportunity to ask questions and all were answered. The patient agreed with the plan and demonstrated an understanding of the instructions.   The patient was advised to call back or seek an in-person evaluation if the  symptoms worsen or if the condition fails to improve as anticipated.    Total Time spent in visit with the patient was:  10 minutes.   Dudley Major, DO

## 2019-01-17 ENCOUNTER — Telehealth (INDEPENDENT_AMBULATORY_CARE_PROVIDER_SITE_OTHER): Payer: 59 | Admitting: Neurology

## 2019-01-17 ENCOUNTER — Encounter: Payer: Self-pay | Admitting: Neurology

## 2019-01-17 ENCOUNTER — Other Ambulatory Visit: Payer: Self-pay

## 2019-01-17 VITALS — Ht 69.0 in | Wt 185.0 lb

## 2019-01-17 DIAGNOSIS — R253 Fasciculation: Secondary | ICD-10-CM | POA: Diagnosis not present

## 2019-01-17 DIAGNOSIS — G43009 Migraine without aura, not intractable, without status migrainosus: Secondary | ICD-10-CM

## 2019-01-17 DIAGNOSIS — M791 Myalgia, unspecified site: Secondary | ICD-10-CM

## 2019-01-17 DIAGNOSIS — R6889 Other general symptoms and signs: Secondary | ICD-10-CM

## 2019-01-18 ENCOUNTER — Other Ambulatory Visit (INDEPENDENT_AMBULATORY_CARE_PROVIDER_SITE_OTHER): Payer: 59

## 2019-01-18 DIAGNOSIS — M791 Myalgia, unspecified site: Secondary | ICD-10-CM

## 2019-01-18 DIAGNOSIS — R6889 Other general symptoms and signs: Secondary | ICD-10-CM

## 2019-01-18 DIAGNOSIS — R253 Fasciculation: Secondary | ICD-10-CM | POA: Diagnosis not present

## 2019-01-18 NOTE — Addendum Note (Signed)
Addended by: Clois Comber on: 01/18/2019 08:25 AM   Modules accepted: Orders

## 2019-01-19 LAB — COMPREHENSIVE METABOLIC PANEL
ALT: 12 U/L (ref 0–35)
AST: 15 U/L (ref 0–37)
Albumin: 4.1 g/dL (ref 3.5–5.2)
Alkaline Phosphatase: 48 U/L (ref 39–117)
BUN: 12 mg/dL (ref 6–23)
CO2: 28 mEq/L (ref 19–32)
Calcium: 8.9 mg/dL (ref 8.4–10.5)
Chloride: 103 mEq/L (ref 96–112)
Creatinine, Ser: 0.93 mg/dL (ref 0.40–1.20)
GFR: 69.92 mL/min (ref >60.00)
Glucose, Bld: 88 mg/dL (ref 70–99)
Potassium: 4 mEq/L (ref 3.5–5.1)
Sodium: 139 mEq/L (ref 135–145)
Total Bilirubin: 0.3 mg/dL (ref 0.2–1.2)
Total Protein: 7.1 g/dL (ref 6.0–8.3)

## 2019-01-19 LAB — MAGNESIUM: Magnesium: 2 mg/dL (ref 1.5–2.5)

## 2019-01-19 LAB — CK: Total CK: 42 U/L (ref 7–177)

## 2019-01-24 ENCOUNTER — Telehealth: Payer: Self-pay

## 2019-01-24 NOTE — Telephone Encounter (Signed)
-----   Message from Pieter Partridge, DO sent at 01/20/2019  6:21 AM EDT ----- Labs are normal

## 2019-01-24 NOTE — Telephone Encounter (Signed)
Patient left VM about lab work. Thanks!

## 2019-01-24 NOTE — Telephone Encounter (Signed)
Called and LMOVM advising Pt labs are normal and to call with any questions.

## 2019-02-08 ENCOUNTER — Ambulatory Visit: Payer: 59 | Admitting: Physician Assistant

## 2019-02-10 ENCOUNTER — Other Ambulatory Visit: Payer: Self-pay | Admitting: Family Medicine

## 2019-02-10 ENCOUNTER — Telehealth: Payer: Self-pay | Admitting: Physician Assistant

## 2019-02-10 DIAGNOSIS — R112 Nausea with vomiting, unspecified: Secondary | ICD-10-CM

## 2019-02-10 NOTE — Telephone Encounter (Signed)
This can wait till Monday, but let her know she either needs to get meds from me, or the other provider, not both.  She can get advice from the person who prescribed the Klonopin.

## 2019-02-10 NOTE — Telephone Encounter (Signed)
Pt was taking Ativan, but would like to switch to Klonopin. She has stopped on her own,and started Klonopin that was from another provider. Would like to speak with someone to continue the switch with you.

## 2019-02-13 NOTE — Telephone Encounter (Signed)
Left voicemail to call back with more information

## 2019-02-14 ENCOUNTER — Other Ambulatory Visit: Payer: Self-pay | Admitting: Physician Assistant

## 2019-02-14 NOTE — Telephone Encounter (Signed)
I'm sending in [place].  What pharmacy? Advise to keep appt w/ me next month.

## 2019-02-14 NOTE — Telephone Encounter (Signed)
Please send it to [facility] in [place].

## 2019-02-14 NOTE — Telephone Encounter (Signed)
Pt. Was started on the Klonopin right after she had her child over a year ago and no longer sees that provider. She has been taking the Ativan you gave her but states she is not doing well on it and does not want to continue it. A year ago that other provider had her on 0.125 of Klonopin and that helped her out and she did well with it. What she was trying to ask is can you please put her on this instead of the Ativan? Please advise.

## 2019-02-17 ENCOUNTER — Other Ambulatory Visit: Payer: Self-pay | Admitting: Physician Assistant

## 2019-02-17 MED ORDER — CLONAZEPAM 0.5 MG PO TABS: 0.5000 mg | ORAL_TABLET | Freq: Two times a day (BID) | ORAL | 0 refills | Status: DC | PRN

## 2019-02-17 NOTE — Telephone Encounter (Signed)
Rx sent 

## 2019-03-13 IMAGING — US US RENAL
1 series · 14 of 25 positions shown · non-contrast
Comparison: CT, 02/10/2017

CLINICAL DATA: Right flank pain for 2 days. Non right kidney stone.

EXAM:
RENAL / URINARY TRACT ULTRASOUND COMPLETE

[Series 1: us renal · 0.24mm/px · 14 of 28 slices shown]
[im 1/28]
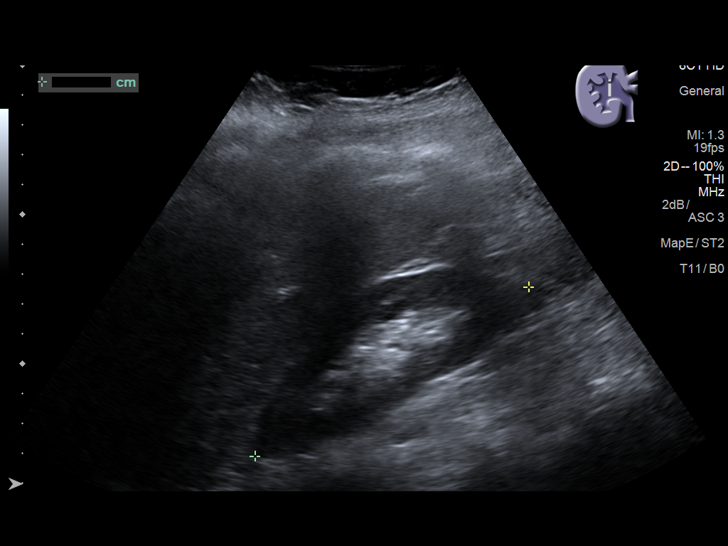
[im 3/28]
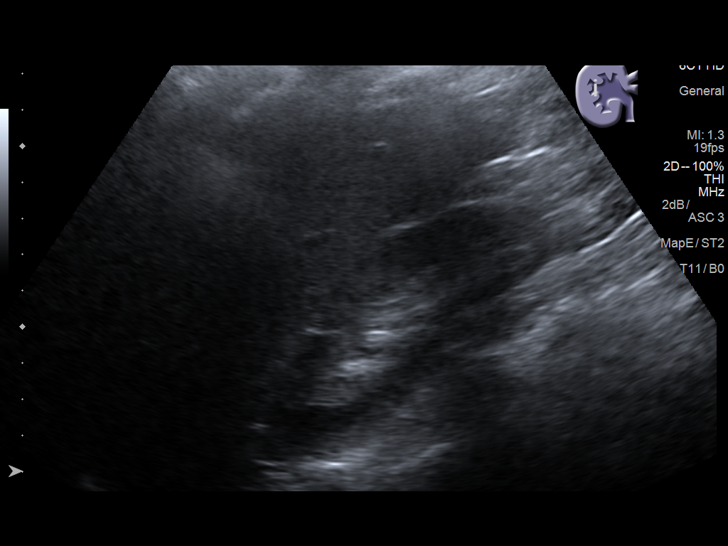
[im 5/28]
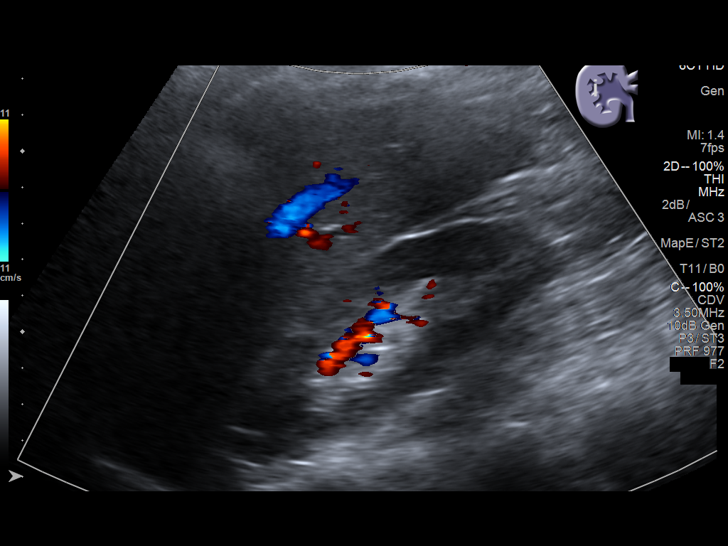
[im 7/28]
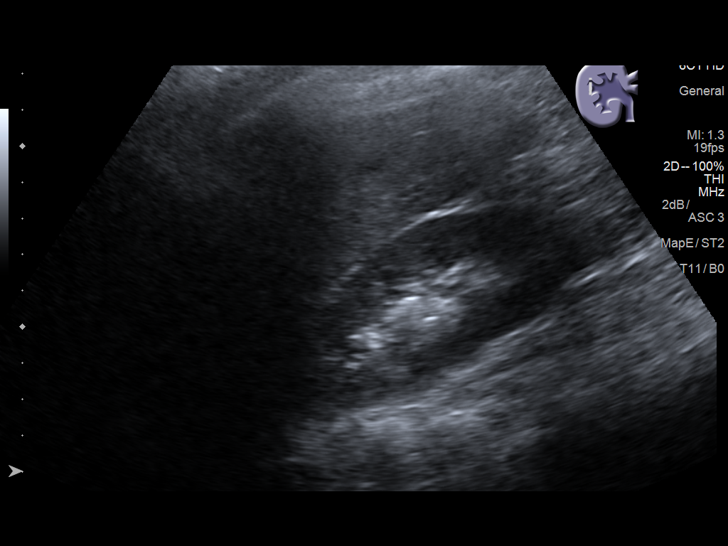
[im 10/28]
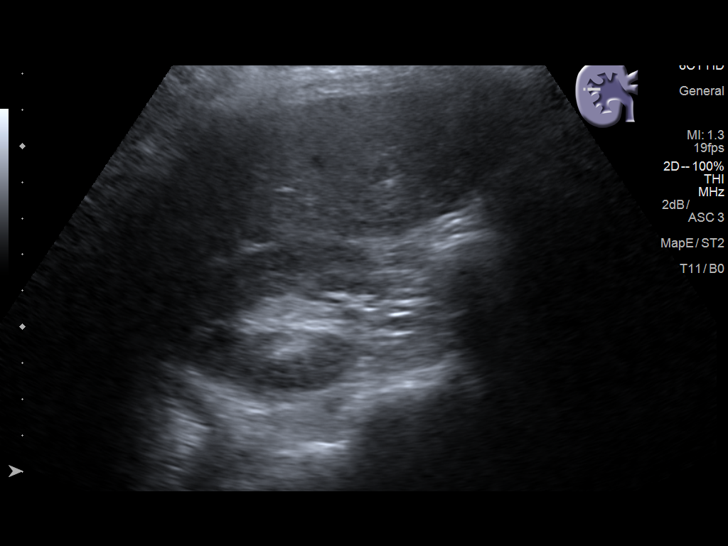
[im 11/28]
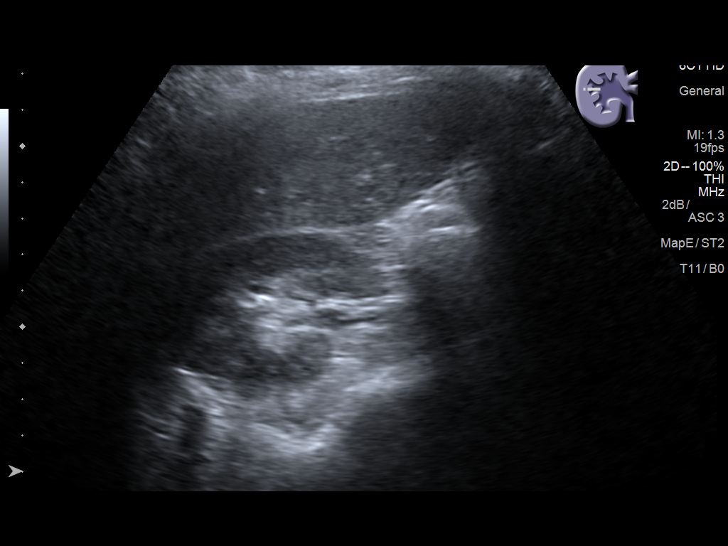
[im 13/28]
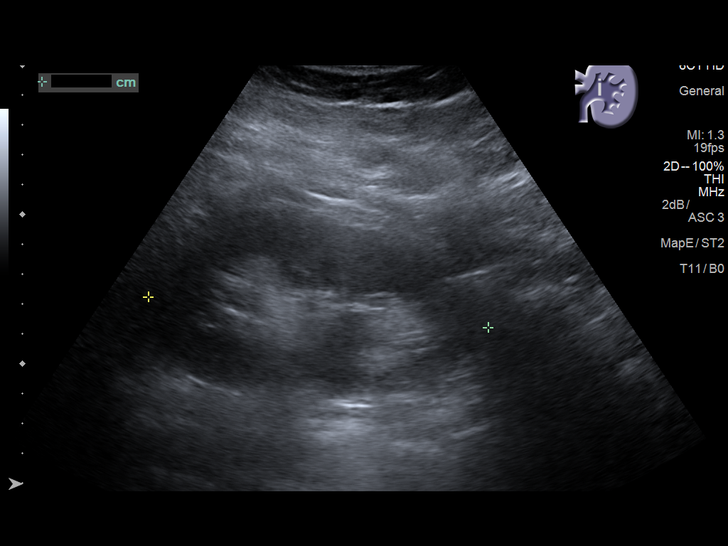
[im 15/28]
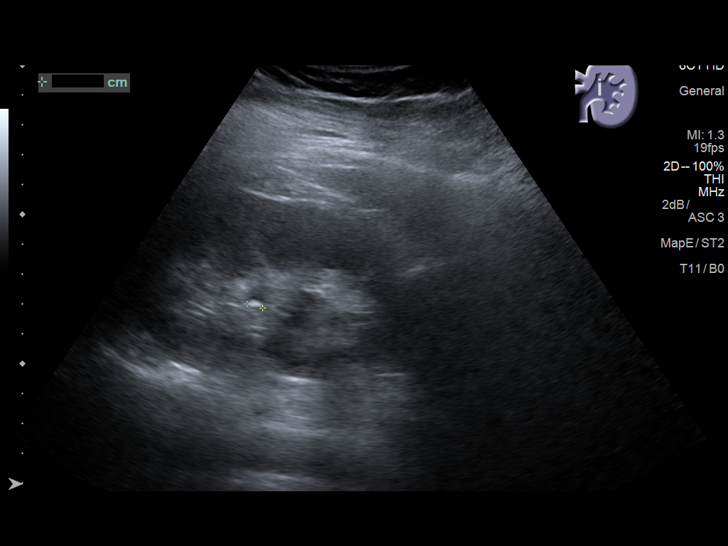
[im 17/28]
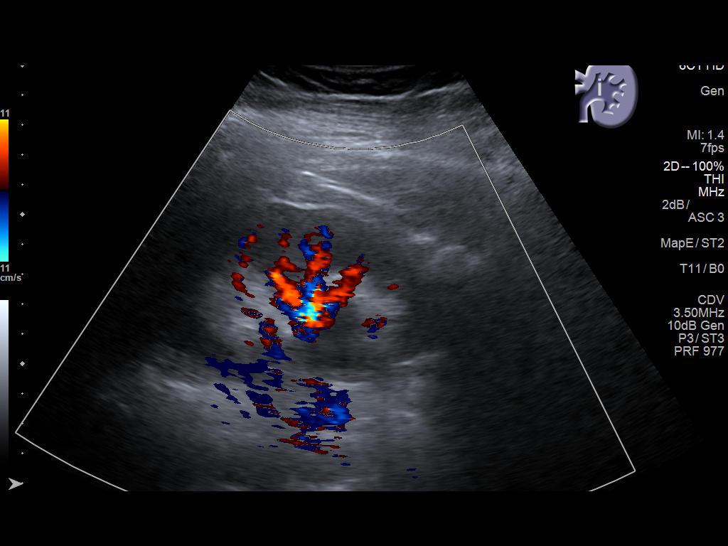
[im 19/28]
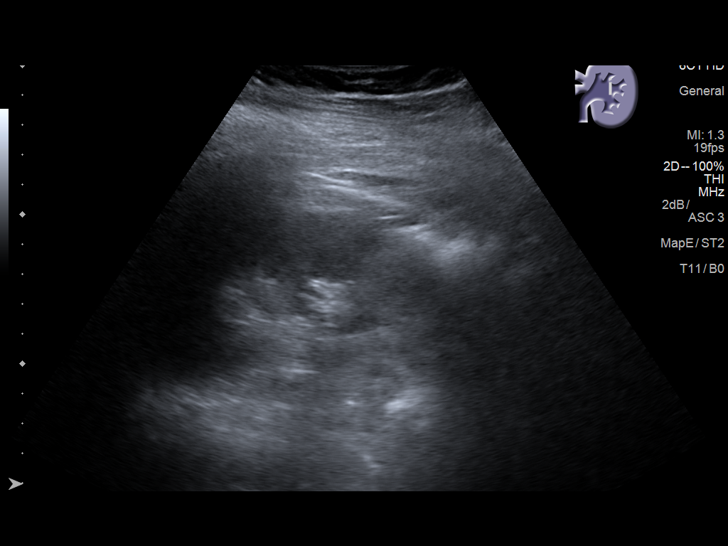
[im 21/28]
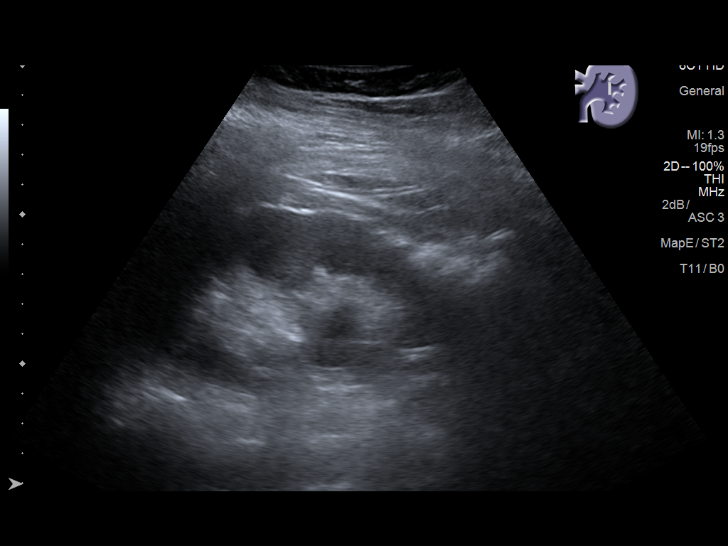
[im 23/28]
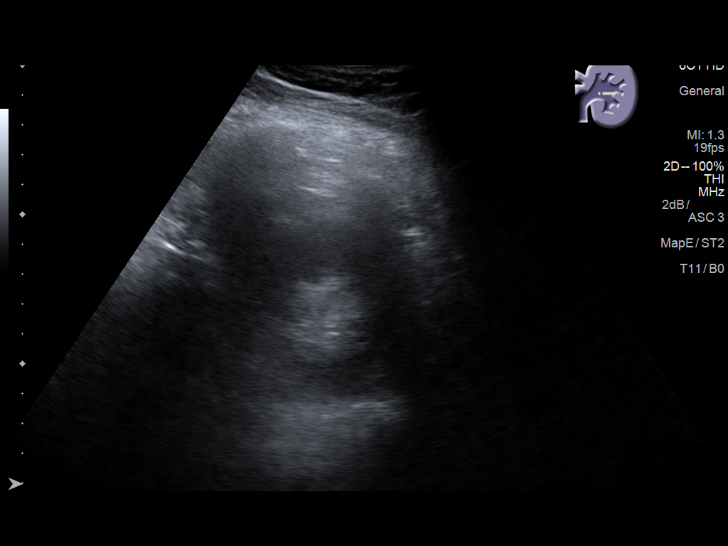
[im 25/28]
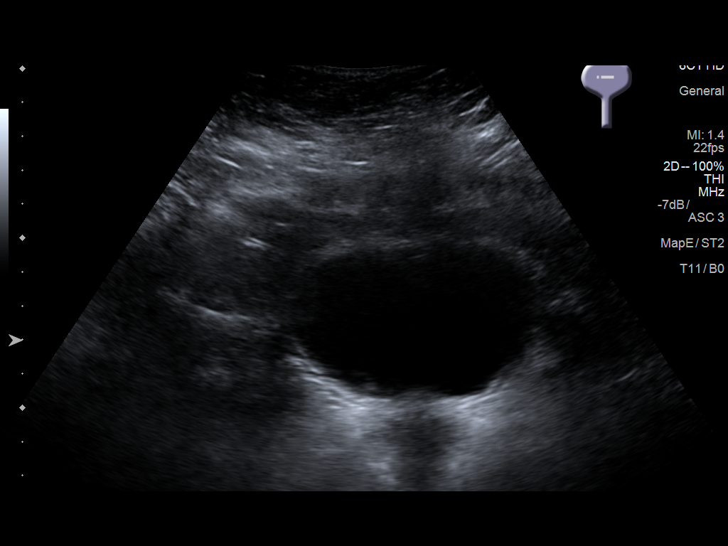
[im 28/28]
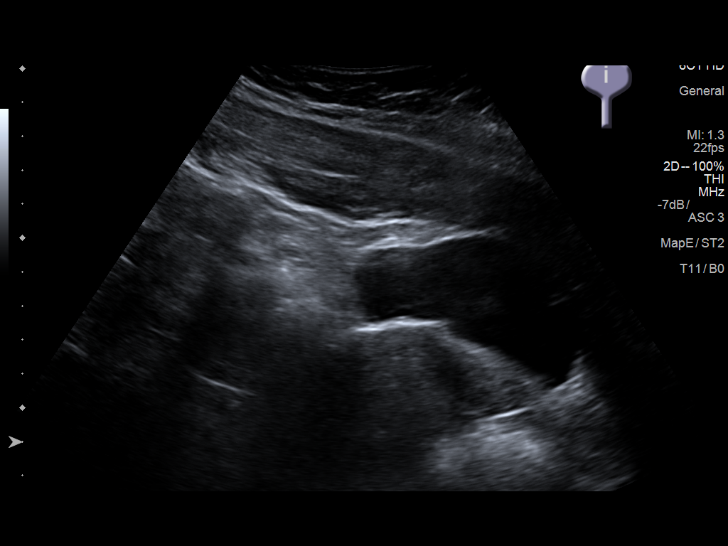

[14 of 25 positions shown; findings below may reference images not displayed]

FINDINGS: Right Kidney:

Length: 10.8 cm. Normal parenchymal echogenicity. Small shadowing
4-5 mm stone in the midpole. No other stones. No hydronephrosis. No
renal masses.

Left Kidney:

Length: 11.4 cm. Normal parenchymal echogenicity. 5 mm stone in the
midpole. No other intrarenal stones. No renal masses. No
hydronephrosis.

Bladder:

Appears normal for degree of bladder distention.
IMPRESSION: 1. No acute findings.  No evidence of obstructive uropathy.
2. Single small stones noted in the mid poles of each kidney. No
hydronephrosis. No other abnormalities.

## 2019-03-14 ENCOUNTER — Other Ambulatory Visit: Payer: Self-pay | Admitting: Family Medicine

## 2019-03-14 DIAGNOSIS — M549 Dorsalgia, unspecified: Secondary | ICD-10-CM

## 2019-03-20 ENCOUNTER — Ambulatory Visit (INDEPENDENT_AMBULATORY_CARE_PROVIDER_SITE_OTHER): Payer: 59 | Admitting: Physician Assistant

## 2019-03-20 ENCOUNTER — Other Ambulatory Visit: Payer: Self-pay

## 2019-03-20 ENCOUNTER — Encounter: Payer: Self-pay | Admitting: Physician Assistant

## 2019-03-20 DIAGNOSIS — F5105 Insomnia due to other mental disorder: Secondary | ICD-10-CM | POA: Diagnosis not present

## 2019-03-20 DIAGNOSIS — F411 Generalized anxiety disorder: Secondary | ICD-10-CM | POA: Diagnosis not present

## 2019-03-20 DIAGNOSIS — F4321 Adjustment disorder with depressed mood: Secondary | ICD-10-CM

## 2019-03-20 DIAGNOSIS — F99 Mental disorder, not otherwise specified: Secondary | ICD-10-CM | POA: Diagnosis not present

## 2019-03-20 NOTE — Progress Notes (Signed)
[facility]  Patient ID: Brandy [name],  MRN: [id number]  PCP: Martinique, Betty G, MD  Date of Evaluation: 03/20/2019 Time spent:15 minutes  Chief Complaint:  Chief Complaint    Anxiety; Follow-up     Virtual Visit via Telephone Note  I connected with patient by a video enabled telemedicine application or telephone, with their informed consent, and verified patient privacy and that I am speaking with the correct person using two identifiers.  I am private, in my home and the patient is home.   I discussed the limitations, risks, security and privacy concerns of performing an evaluation and management service by telephone and the availability of in person appointments. I also discussed with the patient that there may be a patient responsible charge related to this service. The patient expressed understanding and agreed to proceed.   I discussed the assessment and treatment plan with the patient. The patient was provided an opportunity to ask questions and all were answered. The patient agreed with the plan and demonstrated an understanding of the instructions.   The patient was advised to call back or seek an in-person evaluation if the symptoms worsen or if the condition fails to improve as anticipated.  I provided 15 minutes of non-face-to-face time during this encounter.  HISTORY/CURRENT STATUS: HPI For routine med check.    Still anxious but 'not quite as bad.'  For the past 3 days she has not taken any Klonopin at all.  She has only been taking it to sleep, quite a while.  Unable to go to sleep without it.  Now she is using melatonin 10 mg which helps her go to sleep but not stay asleep.  She is hesitant to use the mirtazapine because it made her hungry.  She was taking it every night when it was causing that however.   Still very concerned that something is wrong with her body.  She is keeping a very detailed diary of what she eats and then how she feels.  Where she is  at with her menstrual cycle, etc.  She will be having EMG at the end of this month due to a history of 2 to 3 months of "body twitches all over."  Concerned she is having symptoms of ALS.  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  I just feel like there is something bad wrong and nobody can figure out what it is."  She wants to get off of all prescription medications before she has the EMG to make sure it is not related to the medications.   States she is not depressed but the anxiety causes depression sometimes.  Her energy and motivation level are good.  She does cry easily sometimes.  Her mom is still really sick and that has caused her a lot of anxiety as well.  Denies suicidal or homicidal thoughts.  Individual Medical History/ Review of Systems: Changes? :Yes 'twiches all over my body' for several months.  See above.  Past medications for mental health diagnoses include: Prozac, Luvox, Elavil, Ativan, Xanax, Zoloft, Seroquel, Lithium for 1 pill, Buspar, Inderal, Cymbalta, NAC, Turmeric  Allergies: Metoclopramide, Penicillins, Erenumab-aooe, Fluvoxamine, Sumatriptan, and Zoloft [sertraline hcl]  Current Medications:  Current Outpatient Medications:  .  acetaminophen (TYLENOL) 500 MG tablet, Take 1,000 mg by mouth every 6 (six) hours as needed for headache., Disp: , Rfl:  .  Cholecalciferol (VITAMIN D) 2000 units CAPS, Take 2,000 Units by mouth daily., Disp: , Rfl:  .  clonazePAM (KLONOPIN) 0.5 MG tablet, Take 1 tablet (0.5 mg total) by mouth 2 (two) times daily as needed for anxiety., Disp: 60 tablet, Rfl: 0 .  Coenzyme Q10 (CO Q-10) 50 MG CAPS, Take 50 mg by mouth daily., Disp: , Rfl:  .  MAGNESIUM PO, Take by mouth daily., Disp: , Rfl:  .  Melatonin 10 MG TABS, Take 10 mg by mouth daily., Disp: , Rfl:  .  Omega-3 Fatty Acids (FISH OIL PO), Take by mouth daily., Disp: , Rfl:  .  ondansetron (ZOFRAN ODT) 8 MG disintegrating tablet, Take 1 tablet (8 mg total) by mouth every 8 (eight)  hours as needed for nausea or vomiting., Disp: 60 tablet, Rfl: 0 .  vitamin B-12 (CYANOCOBALAMIN) 500 MCG tablet, Take 500 mcg by mouth daily., Disp: , Rfl:  .  AMBULATORY NON FORMULARY MEDICATION, GI Cocktail -  Take 5-10 ML 4-6 hours as needed.  90 mL Lidocaine 2 % 90 mL Dicyclomine 10 mg/5 ml 270 mL Maalox, Disp: 450 mL, Rfl: 1 .  DULoxetine (CYMBALTA) 20 MG capsule, Take 1 capsule (20 mg total) by mouth daily. (Patient not taking: Reported on 03/20/2019), Disp: 30 capsule, Rfl: 1 .  Misc Natural Products (TART CHERRY ADVANCED PO), Take 1,000 mg by mouth daily., Disp: , Rfl:  Medication Side Effects: none  Family Medical/ Social History: Changes? No  MENTAL HEALTH EXAM:  not currently breastfeeding.There is no height or weight on file to calculate BMI.  General Appearance: unable to assess  Eye Contact:  unable to assess  Speech:  Clear and Coherent  Volume:  Normal  Mood:  Anxious  Affect:  unable to assess  Thought Process:  Goal Directed  Orientation:  Full (Time, Place, and Person)  Thought Content: Logical   Suicidal Thoughts:  No  Homicidal Thoughts:  No  Memory:  WNL  Judgement:  Good  Insight:  Good  Psychomotor Activity:  unable to assess  Concentration:  Concentration: Good  Recall:  Good  Fund of Knowledge: Good  Language: Good  Assets:  Desire for Improvement  ADL's:  Intact  Cognition: WNL  Prognosis:  Good    DIAGNOSES:    ICD-10-CM   1. Generalized anxiety disorder  F41.1   2. Situational depression  F43.21   3. Insomnia due to other mental disorder  F51.05    F99     Receiving Psychotherapy: No    RECOMMENDATIONS:  We discussed sleep hygiene.  Including Amber colored, blue blocker glasses.  No caffeine after 2 PM, which she already adheres to. Continue melatonin as needed. She can take the mirtazapine 15 mg, 1/2-1 nightly as needed, not routinely. She will discontinue Klonopin.  She would like to wait and see what the neurologist says before she  gets back on any routine medicine.  She does not want to become tolerant to the Klonopin though. I strongly recommend psychotherapy. Return in 6 weeks.   Donnal Moat, PA-C   This record has been created using [employer].  Chart creation errors have been sought, but may not always have been located and corrected. Such creation errors do not reflect on the standard of medical care.

## 2019-03-30 ENCOUNTER — Other Ambulatory Visit: Payer: Self-pay

## 2019-03-30 ENCOUNTER — Ambulatory Visit (INDEPENDENT_AMBULATORY_CARE_PROVIDER_SITE_OTHER): Payer: 59 | Admitting: Neurology

## 2019-03-30 DIAGNOSIS — M791 Myalgia, unspecified site: Secondary | ICD-10-CM

## 2019-03-30 DIAGNOSIS — R253 Fasciculation: Secondary | ICD-10-CM | POA: Diagnosis not present

## 2019-03-30 NOTE — Procedures (Signed)
[facility]  [address]  [place], [place] [address] Tel: [phone] Fax:  [phone] Test Date:  03/30/2019  Patient: Brandy [name] DOB: 24-Sep-1985 Physician: Narda Amber, DO  Sex: Female Height: 5\' 9"  Ref Phys: [name], DO  ID#: [id number] Temp: 33.0C Technician:    Patient Complaints: This is a 33 year old female referred for evaluation of generalized muscle twitches and myalgias.  NCV & EMG Findings: Extensive electrodiagnostic testing of the right upper and lower extremity shows:  1. All sensory responses are within normal limits including right median, ulnar, mixed palmar, sural, and superficial peroneal nerves. 2. All motor responses are within normal limits including right median, ulnar, peroneal, and tibial nerves.   3. Right tibial H reflex study is within normal limits. 4. There is no evidence of active or chronic motor axonal loss changes affecting any of the tested muscles.  Motor unit configuration and recruitment pattern is within normal limits.  Impression: This is a normal study of the right upper and lower extremities.  In particular, there is no evidence of a diffuse myopathy, cervical/lumbosacral radiculopathy, or sensorimotor polyneuropathy.   ___________________________ Narda Amber, DO    Nerve Conduction Studies Anti Sensory Summary Table   Site NR Peak (ms) Norm Peak (ms) P-T Amp (V) Norm P-T Amp  Right Median Anti Sensory (2nd Digit)  33C  Wrist    2.4 <3.4 73.7 >20  Right Sup Peroneal Anti Sensory (Ant Lat Mall)  33C  12 cm    2.2 <4.5 23.9 >5  Right Sural Anti Sensory (Lat Mall)  33C  Calf    2.8 <4.5 26.7 >5  Right Ulnar Anti Sensory (5th Digit)  33C  Wrist    2.3 <3.1 47.2 >12   Motor Summary Table   Site NR Onset (ms) Norm Onset (ms) O-P Amp (mV) Norm O-P Amp Site1 Site2 Delta-0 (ms) Dist (cm) Vel (m/s) Norm Vel (m/s)  Right Median Motor (Abd Poll Brev)  33C  Wrist    2.3 <3.9 6.2 >6 Elbow Wrist 4.3  29.0 67 >50  Elbow    6.6  6.0         Right Peroneal Motor (Ext Dig Brev)  33C  Ankle    3.3 <5.5 5.8 >3 B Fib Ankle 7.9 40.0 51 >40  B Fib    11.2  5.4  Poplt B Fib 1.2 8.0 67 >40  Poplt    12.4  5.4         Right Peroneal TA Motor (Tib Ant)  33C  Fib Head    2.3 <4.0 5.3 >4 Poplit Fib Head 1.3 8.0 62 >40  Poplit    3.6  5.2         Right Tibial Motor (Abd Hall Brev)  33C  Ankle    3.2 <6.0 10.2 >8 Knee Ankle 8.4 40.0 48 >40  Knee    11.6  5.9         Right Ulnar Motor (Abd Dig Minimi)  33C  Wrist    2.0 <3.1 10.0 >7 B Elbow Wrist 3.5 23.0 66 >50  B Elbow    5.5  9.3  A Elbow B Elbow 1.6 10.0 63 >50  A Elbow    7.1  8.6          Comparison Summary Table   Site NR Peak (ms) Norm Peak (ms) P-T Amp (V) Site1 Site2 Delta-P (ms) Norm Delta (ms)  Right Median/Ulnar Palm Comparison (Wrist - 8cm)  33C  Median Palm    1.6 <2.2 73.4 Median Palm Ulnar Palm 0.2   Ulnar Palm    1.4 <2.2 22.9       H Reflex Studies   NR H-Lat (ms) Lat Norm (ms) L-R H-Lat (ms)  Right Tibial (Gastroc)  33C     32.65 <35    EMG   Side Muscle Ins Act Fibs Psw Fasc Number Recrt Dur Dur. Amp Amp. Poly Poly. Comment  Right AntTibialis Nml Nml Nml Nml Nml Nml Nml Nml Nml Nml Nml Nml N/A  Right Gastroc Nml Nml Nml Nml Nml Nml Nml Nml Nml Nml Nml Nml N/A  Right RectFemoris Nml Nml Nml Nml Nml Nml Nml Nml Nml Nml Nml Nml N/A  Right GluteusMed Nml Nml Nml Nml Nml Nml Nml Nml Nml Nml Nml Nml N/A  Right Lumbo Parasp Low Nml Nml Nml Nml Nml Nml Nml Nml Nml Nml Nml Nml N/A  Right 1stDorInt Nml Nml Nml Nml Nml Nml Nml Nml Nml Nml Nml Nml N/A  Right PronatorTeres Nml Nml Nml Nml Nml Nml Nml Nml Nml Nml Nml Nml N/A  Right Biceps Nml Nml Nml Nml Nml Nml Nml Nml Nml Nml Nml Nml N/A  Right Triceps Nml Nml Nml Nml Nml Nml Nml Nml Nml Nml Nml Nml N/A  Right Deltoid Nml Nml Nml Nml Nml Nml Nml Nml Nml Nml Nml Nml N/A      Waveforms:

## 2019-03-31 ENCOUNTER — Telehealth: Payer: Self-pay

## 2019-03-31 NOTE — Telephone Encounter (Signed)
-----   Message from Pieter Partridge, DO sent at 03/31/2019  7:10 AM EDT ----- Nerve and muscle study is normal.  No evidence of muscle or nerve problem.  I have no neurologic explanation for muscle twitches and aches.

## 2019-03-31 NOTE — Telephone Encounter (Signed)
Called and left detailed message on VM, and advised Pt to call with any questions.

## 2019-04-20 ENCOUNTER — Ambulatory Visit: Payer: 59 | Admitting: Internal Medicine

## 2019-05-02 ENCOUNTER — Telehealth: Payer: Self-pay | Admitting: Physician Assistant

## 2019-05-02 NOTE — Telephone Encounter (Signed)
Pt needs refill on klonopin sent to [facility] in [place].

## 2019-05-03 ENCOUNTER — Other Ambulatory Visit: Payer: Self-pay

## 2019-05-03 MED ORDER — CLONAZEPAM 0.5 MG PO TABS: 0.5000 mg | ORAL_TABLET | Freq: Two times a day (BID) | ORAL | 0 refills | Status: DC | PRN

## 2019-05-03 NOTE — Telephone Encounter (Signed)
Yes it's ok to go back on it.

## 2019-05-03 NOTE — Telephone Encounter (Signed)
error 

## 2019-05-03 NOTE — Telephone Encounter (Signed)
Rx sent to [facility]

## 2019-06-06 ENCOUNTER — Ambulatory Visit: Payer: 59 | Admitting: Physician Assistant

## 2019-06-21 ENCOUNTER — Other Ambulatory Visit: Payer: Self-pay

## 2019-06-21 ENCOUNTER — Encounter: Payer: Self-pay | Admitting: *Deleted

## 2019-06-21 ENCOUNTER — Ambulatory Visit (HOSPITAL_COMMUNITY)
Admission: EM | Admit: 2019-06-21 | Discharge: 2019-06-21 | Disposition: A | Discharge status: Home / Self Care | Payer: 59 | Attending: Urgent Care | Admitting: Urgent Care

## 2019-06-21 DIAGNOSIS — Z825 Family history of asthma and other chronic lower respiratory diseases: Secondary | ICD-10-CM | POA: Diagnosis not present

## 2019-06-21 DIAGNOSIS — Z8371 Family history of colonic polyps: Secondary | ICD-10-CM | POA: Insufficient documentation

## 2019-06-21 DIAGNOSIS — F329 Major depressive disorder, single episode, unspecified: Secondary | ICD-10-CM | POA: Insufficient documentation

## 2019-06-21 DIAGNOSIS — K219 Gastro-esophageal reflux disease without esophagitis: Secondary | ICD-10-CM

## 2019-06-21 DIAGNOSIS — R1013 Epigastric pain: Secondary | ICD-10-CM | POA: Insufficient documentation

## 2019-06-21 DIAGNOSIS — R112 Nausea with vomiting, unspecified: Secondary | ICD-10-CM | POA: Diagnosis not present

## 2019-06-21 DIAGNOSIS — Z8249 Family history of ischemic heart disease and other diseases of the circulatory system: Secondary | ICD-10-CM | POA: Diagnosis not present

## 2019-06-21 DIAGNOSIS — R630 Anorexia: Secondary | ICD-10-CM | POA: Diagnosis not present

## 2019-06-21 DIAGNOSIS — R0789 Other chest pain: Secondary | ICD-10-CM

## 2019-06-21 DIAGNOSIS — Z9049 Acquired absence of other specified parts of digestive tract: Secondary | ICD-10-CM | POA: Insufficient documentation

## 2019-06-21 DIAGNOSIS — Z79899 Other long term (current) drug therapy: Secondary | ICD-10-CM | POA: Insufficient documentation

## 2019-06-21 DIAGNOSIS — Z20828 Contact with and (suspected) exposure to other viral communicable diseases: Secondary | ICD-10-CM | POA: Insufficient documentation

## 2019-06-21 DIAGNOSIS — E559 Vitamin D deficiency, unspecified: Secondary | ICD-10-CM | POA: Insufficient documentation

## 2019-06-21 DIAGNOSIS — Z833 Family history of diabetes mellitus: Secondary | ICD-10-CM | POA: Diagnosis not present

## 2019-06-21 DIAGNOSIS — F419 Anxiety disorder, unspecified: Secondary | ICD-10-CM | POA: Insufficient documentation

## 2019-06-21 DIAGNOSIS — Z800 Family history of malignant neoplasm of digestive organs: Secondary | ICD-10-CM | POA: Diagnosis not present

## 2019-06-21 DIAGNOSIS — Z87442 Personal history of urinary calculi: Secondary | ICD-10-CM | POA: Diagnosis not present

## 2019-06-21 DIAGNOSIS — Z888 Allergy status to other drugs, medicaments and biological substances status: Secondary | ICD-10-CM | POA: Insufficient documentation

## 2019-06-21 DIAGNOSIS — Z880 Allergy status to penicillin: Secondary | ICD-10-CM | POA: Diagnosis not present

## 2019-06-21 MED ORDER — SUCRALFATE 1 G PO TABS: 1.0000 g | ORAL_TABLET | Freq: Three times a day (TID) | ORAL | 0 refills | Status: DC

## 2019-06-21 MED ORDER — FAMOTIDINE 20 MG PO TABS: 20.0000 mg | ORAL_TABLET | Freq: Two times a day (BID) | ORAL | 0 refills | Status: DC

## 2019-06-21 MED ORDER — ESOMEPRAZOLE MAGNESIUM 20 MG PO CPDR: 20.0000 mg | DELAYED_RELEASE_CAPSULE | Freq: Two times a day (BID) | ORAL | 0 refills | Status: DC

## 2019-06-21 MED ORDER — ONDANSETRON 8 MG PO TBDP: 8.0000 mg | ORAL_TABLET | Freq: Three times a day (TID) | ORAL | 0 refills | Status: DC | PRN

## 2019-06-21 NOTE — ED Triage Notes (Signed)
Pt presents to UC w/ c/o chest pains, feeling weak, dry mouth x2 days. Pt states chest feels like "something is stuck"

## 2019-06-21 NOTE — ED Provider Notes (Signed)
MRN: [id number] DOB: Apr 02, 1986  Subjective:   Brandy [name] is a 33 y.o. female presenting for 2-day history of acute onset persistent epigastric pain that has now progressed to midsternal chest pain, heart racing and palpitations.  Patient feels like there is a knot stuck in her chest and epigastric area.  She also had nausea and vomiting earlier today, decreased appetite and a very dry mouth.  She contacted her PCP and was advised to report to the emergency room but patient refused because she did not want to risk COVID exposure.  She has been taking her Pepcid 10 mg in the morning and 20 mg at bedtime without any relief.  She was supposed to complete a stool antigen test for H. pylori but has not done so in the past few months as recommended.   No current facility-administered medications for this encounter.   Current Outpatient Medications:  .  acetaminophen (TYLENOL) 500 MG tablet, Take 1,000 mg by mouth every 6 (six) hours as needed for headache., Disp: , Rfl:  .  AMBULATORY NON FORMULARY MEDICATION, GI Cocktail -  Take 5-10 ML 4-6 hours as needed.  90 mL Lidocaine 2 % 90 mL Dicyclomine 10 mg/5 ml 270 mL Maalox, Disp: 450 mL, Rfl: 1 .  Cholecalciferol (VITAMIN D) 2000 units CAPS, Take 2,000 Units by mouth daily., Disp: , Rfl:  .  clonazePAM (KLONOPIN) 0.5 MG tablet, Take 1 tablet (0.5 mg total) by mouth 2 (two) times daily as needed for anxiety., Disp: 60 tablet, Rfl: 0 .  Coenzyme Q10 (CO Q-10) 50 MG CAPS, Take 50 mg by mouth daily., Disp: , Rfl:  .  DULoxetine (CYMBALTA) 20 MG capsule, Take 1 capsule (20 mg total) by mouth daily. (Patient not taking: Reported on 03/20/2019), Disp: 30 capsule, Rfl: 1 .  MAGNESIUM PO, Take by mouth daily., Disp: , Rfl:  .  Melatonin 10 MG TABS, Take 10 mg by mouth daily., Disp: , Rfl:  .  Misc Natural Products (TART CHERRY ADVANCED PO), Take 1,000 mg by mouth daily., Disp: , Rfl:  .  Omega-3 Fatty Acids (FISH OIL PO), Take by mouth daily., Disp: , Rfl:  .   ondansetron (ZOFRAN ODT) 8 MG disintegrating tablet, Take 1 tablet (8 mg total) by mouth every 8 (eight) hours as needed for nausea or vomiting., Disp: 60 tablet, Rfl: 0 .  vitamin B-12 (CYANOCOBALAMIN) 500 MCG tablet, Take 500 mcg by mouth daily., Disp: , Rfl:     Allergies  Allergen Reactions  . Metoclopramide Palpitations    Rapid heart rate Rapid heart rate Rapid heart rate Rapid heart rate Rapid heart rate Rapid heart rate Rapid heart rate  . Penicillins Hives, Other (See Comments) and Rash    Childhood reaction Has patient had a PCN reaction causing immediate rash, facial/tongue/throat swelling, SOB or lightheadedness with hypotension: No Has patient had a PCN reaction causing severe rash involving mucus membranes or skin necrosis: No Has patient had a PCN reaction that required hospitalization No Has patient had a PCN reaction occurring within the last 10 years: No If all of the above answers are "NO", then may proceed with Cephalosporin use.  Childhood reaction Has patient had a PCN reaction causing immediate rash, facial/tongue/throat swelling, SOB or lightheadedness with hypotension: No Has patient had a PCN reaction causing severe rash involving mucus membranes or skin necrosis: No Has patient had a PCN reaction that required hospitalization No Has patient had a PCN reaction occurring within the last 10 years: No If  all of the above answers are "NO", then may proceed with Cephalosporin use.  Eduard Roux Other (See Comments)    Other reaction(s): Myalgias (intolerance) Constipation, hair loss  . Fluvoxamine Other (See Comments)    fainted Other reaction(s): Unknown fainted fainted  . Sumatriptan Palpitations  . Zoloft [Sertraline Hcl] Anxiety    Past Medical History:  Diagnosis Date  . Anxiety   . Depression   . Gallstones   . History of nephrolithiasis   . Kidney stone   . Migraine   . Pituitary tumor   . Vitamin D deficiency      Past Surgical  History:  Procedure Laterality Date  . CHOLECYSTECTOMY  2014  . KIDNEY STONE SURGERY     x 5  . MOUTH SURGERY    . TYMPANOSTOMY TUBE PLACEMENT      Social History   Tobacco Use  . Smoking status: Never Smoker  . Smokeless tobacco: Never Used  Substance Use Topics  . Alcohol use: Not Currently    Frequency: Never  . Drug use: No    Family History  Problem Relation Age of Onset  . Diabetes Mother   . Mental illness Mother   . Asthma Mother   . Stomach cancer Mother   . Colon polyps Mother   . Heart disease Mother   . Hypertension Mother   . Arthritis Mother   . Diabetes Father   . Hypertension Father   . Anxiety disorder Sister   . Colon cancer Maternal Grandmother   . Diabetes Paternal Grandmother   . Breast cancer Maternal Aunt   . Lung cancer Maternal Aunt   . Diabetes Paternal Aunt   . Diabetes Maternal Uncle   . Diabetes Maternal Uncle     Review of Systems  Constitutional: Positive for malaise/fatigue. Negative for fever.  HENT: Negative for congestion, ear pain, sinus pain and sore throat.   Eyes: Negative for discharge and redness.  Respiratory: Positive for shortness of breath. Negative for cough, hemoptysis and wheezing.   Cardiovascular: Positive for chest pain and palpitations.  Gastrointestinal: Positive for abdominal pain, nausea and vomiting. Negative for diarrhea.  Genitourinary: Negative for dysuria, flank pain and hematuria.  Musculoskeletal: Negative for myalgias.  Skin: Negative for rash.  Neurological: Positive for dizziness and weakness. Negative for headaches.  Psychiatric/Behavioral: Negative for depression and substance abuse.    Objective:   Vitals: BP 129/75 (BP Location: Left Arm)   Pulse 79   Temp 98 F (36.7 C) (Temporal)   Resp 18   LMP 06/13/2019   SpO2 99%   Physical Exam Constitutional:      General: She is not in acute distress.    Appearance: Normal appearance. She is well-developed. She is obese. She is not  ill-appearing, toxic-appearing or diaphoretic.  HENT:     Head: Normocephalic and atraumatic.     Right Ear: External ear normal.     Left Ear: External ear normal.     Nose: Nose normal.     Mouth/Throat:     Mouth: Mucous membranes are moist.     Pharynx: Oropharynx is clear.  Eyes:     General: No scleral icterus.    Extraocular Movements: Extraocular movements intact.     Pupils: Pupils are equal, round, and reactive to light.  Cardiovascular:     Rate and Rhythm: Normal rate and regular rhythm.     Pulses: Normal pulses.     Heart sounds: Normal heart sounds. No murmur. No friction  rub. No gallop.   Pulmonary:     Effort: Pulmonary effort is normal. No respiratory distress.     Breath sounds: Normal breath sounds. No stridor. No wheezing, rhonchi or rales.  Abdominal:     General: Bowel sounds are normal. There is no distension.     Palpations: Abdomen is soft. There is no mass.     Tenderness: There is abdominal tenderness (worst over epigastric region). There is no right CVA tenderness, left CVA tenderness, guarding or rebound.  Skin:    General: Skin is warm and dry.     Coloration: Skin is not pale.     Findings: No rash.  Neurological:     General: No focal deficit present.     Mental Status: She is alert and oriented to person, place, and time.  Psychiatric:        Mood and Affect: Mood normal.        Behavior: Behavior normal.        Thought Content: Thought content normal.        Judgment: Judgment normal.    ED ECG REPORT   Date: 06/21/2019  Rate: 89bpm  Rhythm: normal sinus rhythm  QRS Axis: normal  Intervals: normal  ST/T Wave abnormalities: normal  Conduction Disutrbances:none  Narrative Interpretation: Sinus rhythm at 89 bpm comparable to ECG from 05/31/2018.  Old EKG Reviewed: unchanged  I have personally reviewed the EKG tracing and agree with the computerized printout as noted.   Assessment and Plan :   1. Atypical chest pain   2. Abdominal  pain, epigastric   3. Gastroesophageal reflux disease, unspecified whether esophagitis present   4. Nausea and vomiting, intractability of vomiting not specified, unspecified vomiting type   5. Decreased appetite     Discussed differential with patient extensively but I am most suspicious of worsening GERD, PUD, esophagitis, H. pylori infection.  Patient has low risk factors for chest PE, ACS.  Cardiopulmonary exam is very reassuring, normal EKG.  Patient is to maintain her follow-up with cardiologist tomorrow and counseled that she can also follow her PCP recommendations to report to the emergency room.  Otherwise, will have patient start Nexium, Carafate, Pepcid (we will have her increase this medication to twice daily at 20 mg). Counseled patient on potential for adverse effects with medications prescribed/recommended today, ER and return-to-clinic precautions discussed, patient verbalized understanding.    Jaynee Eagles, PA-C 06/21/19 1740

## 2019-06-22 ENCOUNTER — Encounter: Payer: Self-pay | Admitting: Cardiology

## 2019-06-22 ENCOUNTER — Ambulatory Visit (INDEPENDENT_AMBULATORY_CARE_PROVIDER_SITE_OTHER): Payer: 59 | Admitting: Cardiology

## 2019-06-22 ENCOUNTER — Ambulatory Visit (INDEPENDENT_AMBULATORY_CARE_PROVIDER_SITE_OTHER): Payer: 59

## 2019-06-22 VITALS — BP 100/70 | HR 79 | Ht 69.0 in | Wt 210.0 lb

## 2019-06-22 DIAGNOSIS — R42 Dizziness and giddiness: Secondary | ICD-10-CM

## 2019-06-22 DIAGNOSIS — R002 Palpitations: Secondary | ICD-10-CM

## 2019-06-22 DIAGNOSIS — R079 Chest pain, unspecified: Secondary | ICD-10-CM

## 2019-06-22 NOTE — Patient Instructions (Signed)
Medication Instructions:  Your physician recommends that you continue on your current medications as directed. Please refer to the Current Medication list given to you today.  *If you need a refill on your cardiac medications before your next appointment, please call your pharmacy*  Lab Work: Your physician recommends that you return for lab work in: TODAY BMP,Magnesium,TSH  If you have labs (blood work) drawn today and your tests are completely normal, you will receive your results only by: Marland Kitchen MyChart Message (if you have MyChart) OR . A paper copy in the mail If you have any lab test that is abnormal or we need to change your treatment, we will call you to review the results.  Testing/Procedures: Dr Harriet Masson recommends a Calcium  Score CT. You will be contacted by a scheduler for an appointment and time. There is a 1 time cost of $150 out of pocket insurance does not cover.  A zio monitor was placed today. It will remain on for 14 days. You will then return monitor and event diary in provided box. It takes 1-2 weeks for report to be downloaded and returned to [place]. We will call you with the results. If monitor falls off or has orange flashing light, please call Zio for further instructions.    Follow-Up: At [facility], you and your health needs are our priority.  As part of our continuing mission to provide you with exceptional heart care, we have created designated Provider Care Teams.  These Care Teams include your primary Cardiologist (physician) and Advanced Practice Providers (APPs -  Physician Assistants and Nurse Practitioners) who all work together to provide you with the care you need, when you need it.  Your next appointment:   2 months  The format for your next appointment:   In Person  Provider:   Berniece Salines, DO  Other Instructions

## 2019-06-22 NOTE — Progress Notes (Signed)
Cardiology Office Note:    Date:  06/22/2019   ID:  Brandy [name], DOB [birth year], MRN [id number]  PCP:  Martinique, Betty G, MD  Cardiologist:  Buford Dresser, MD  Electrophysiologist:  None   Referring MD: Martinique, Betty G, MD   Chief Complaint  Patient presents with  . Palpitations    Not feeling well   History of Present Illness:    Brandy [name] is a 33 y.o. female with a hx of anxiety and postpartum depression.  The patient was initially seen in consultation on May 31, 2018 for complaints of palpitation and shortness of breath. During her initial consultation it was noted that the patient had seen Dr. Nehemiah Massed at [facility] clinic and had had previous work-up with a normal echocardiogram (July 2019) and a treadmill SPECT stress test (which was also July 2019) which I was able to review in Care Everywhere.  Her Holter August 29 showed sinus tachycardia.  The conclusion at that visit a 48-hour Holter monitor was placed on patient she was encouraged to continue hydration as well as continue treatment for her anxiety.  Review of the Holter monitors did not show predominant sinus arrhythmia with 1 PAC.  There are no ventricular ectopy or no pauses.  Her average heart rate was 82 bpm.  She did continue to have palpitations therefore a longer monitor was placed on patient for 6 days which showed dominant rhythm of sinus rhythm.  Isolated atrial ventricular ectopy.  12 of her triggered events were associated with sinus rhythm, one was normal sinus rhythm with PVC, one was sinus tachycardia with PVC and one was sinus rhythm with PAC.  Her most recent encounter was Jan 03, 2019 and this was a telemetry medicine visit.  At that time we discussed with the patient that their lack of evidence at this point for tachycardia process.  Given her low pretest probability as well.  He was encouraged to do other noncardiac work-up.  In the interim she tells me that she has been taking medication for  gastroesophageal reflux, she has been treated aggressively for her anxiety.  The most recently she started taking any experience worsening palpitations than she did in the past.  She said this time she get an abrupt onset of palpitation lasting about 10 to 15 minutes sometimes that is associated with dizziness.  She tells me that she is very concerned about this.  Was more bothersome now she says that she has been having some dull chest pressure substernally that she experienced on exertion.  She states that he does not feel like her reflux pain she has been treated for this.  Nothing makes it better or worse.  She still goes by with time usually within 15 minutes.  However she said the incident started not more.  No other complaints at this time  Past Medical History:  Diagnosis Date  . Anxiety   . Anxiety associated with birthing process 06/14/2018  . B12 deficiency 07/21/2016  . Chronic mixed headache syndrome 06/14/2018  . Depression   . DUB (dysfunctional uterine bleeding) 06/04/2011  . Enlarged thyroid gland 12/30/2015  . Fatigue 12/30/2015  . Gallstones   . Generalized anxiety disorder 06/09/2016  . GERD (gastroesophageal reflux disease) 02/19/2011  . History of nephrolithiasis   . Kidney stone   . Medication overuse headache 06/14/2018  . Menorrhagia 11/09/2018  . Migraine   . Missed abortion 11/09/2018  . NEPHROLITHIASIS, HX OF 12/16/2007   Qualifier: Diagnosis of  By: Sherren Mocha MD,  Jeffrey A   . Palpitations 03/21/2018  . Pituitary tumor   . 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 Precordial pain 03/21/2018  . Serum calcium elevated 12/28/2013  . SVD (spontaneous vaginal delivery) 02/02/2018  . Tendinitis of right ankle 08/14/2014  . 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 Vitamin D deficiency     Past Surgical History:  Procedure Laterality Date  . CHOLECYSTECTOMY  2014  . KIDNEY STONE SURGERY     x 5  . MOUTH SURGERY    . TYMPANOSTOMY TUBE PLACEMENT      Current Medications: Current Meds   Medication Sig  . acetaminophen (TYLENOL) 500 MG tablet Take 1,000 mg by mouth every 6 (six) hours as needed for headache.  Marland Kitchen aspirin EC 81 MG tablet Take 81 mg by mouth daily.  . Cholecalciferol (VITAMIN D) 2000 units CAPS Take 2,000 Units by mouth daily.  . clonazePAM (KLONOPIN) 0.5 MG tablet Take 1 tablet (0.5 mg total) by mouth 2 (two) times daily as needed for anxiety.  Marland Kitchen esomeprazole (NEXIUM) 20 MG capsule Take 1 capsule (20 mg total) by mouth 2 (two) times daily before a meal.  . famotidine (PEPCID) 20 MG tablet Take 1 tablet (20 mg total) by mouth 2 (two) times daily.  . Misc Natural Products (TART CHERRY ADVANCED PO) Take 1,000 mg by mouth daily.  . Omega-3 Fatty Acids (FISH OIL PO) Take by mouth daily.  . ondansetron (ZOFRAN ODT) 8 MG disintegrating tablet Take 1 tablet (8 mg total) by mouth every 8 (eight) hours as needed for nausea or vomiting.  . sucralfate (CARAFATE) 1 g tablet Take 1 tablet (1 g total) by mouth 4 (four) times daily -  with meals and at bedtime.  . [DISCONTINUED] ondansetron (ZOFRAN-ODT) 8 MG disintegrating tablet Take 1 tablet (8 mg total) by mouth every 8 (eight) hours as needed for nausea or vomiting.     Allergies:   Metoclopramide, Penicillins, Erenumab-aooe, Fluvoxamine, Sumatriptan, and Zoloft [sertraline hcl]   Social History   Socioeconomic History  . Marital status: Married    Spouse name: Jonathon  . Number of children: 1  . Years of education: Not on file  . Highest education level: Bachelor's degree (e.g., BA, AB, BS)  Occupational History  . Occupation: Marine scientist: [employer]  . Financial resource strain: Not on file  . Food insecurity    Worry: Not on file    Inability: Not on file  . Transportation needs    Medical: Not on file    Non-medical: Not on file  Tobacco Use  . Smoking status: Never Smoker  . Smokeless tobacco: Never Used  Substance and Sexual Activity  . Alcohol use: Not Currently     Frequency: Never  . Drug use: No  . Sexual activity: Yes  Lifestyle  . Physical activity    Days per week: Not on file    Minutes per session: Not on file  . Stress: Not on file  Relationships  . Social Herbalist on phone: Not on file    Gets together: Not on file    Attends religious service: Not on file    Active member of club or organization: Not on file    Attends meetings of clubs or organizations: Not on file    Relationship status: Not on file  Other Topics Concern  . Not on file  Social History Narrative   Patient is right-handed. She lives with her  husband and child in a split-level home.    Daughter born 01/2018   She avoids caffeine.    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  ROS:   Review of Systems  Constitution: Negative for decreased appetite, fever and weight gain.  HENT: Negative for congestion, ear discharge, hoarse voice and sore throat.   Eyes: Negative for discharge, redness, vision loss in right eye and visual halos.  Cardiovascular: Negative for chest pain, dyspnea on exertion, leg swelling, orthopnea and palpitations.  Respiratory: Negative for cough, hemoptysis, shortness of breath and snoring.   Endocrine: Negative for heat intolerance and polyphagia.  Hematologic/Lymphatic: Negative for bleeding problem. Does not bruise/bleed easily.  Skin: Negative for flushing,  nail changes, rash and suspicious lesions.  Musculoskeletal: Negative for arthritis, joint pain, muscle cramps, myalgias, neck pain and stiffness.  Gastrointestinal: Negative for abdominal pain, bowel incontinence, diarrhea and excessive appetite.  Genitourinary: Negative for decreased libido, genital sores and incomplete emptying.  Neurological: Negative for brief paralysis, focal weakness, headaches and loss of balance.  Psychiatric/Behavioral: Negative for altered mental status, depression and suicidal ideas.  Allergic/Immunologic: Negative for HIV exposure and persistent infections.    EKGs/Labs/Other Studies Reviewed:    The following studies were reviewed today:   EKG:  The ekg ordered today demonstrates   Ambulatory monitor 6 days of data recorded on Zio monitor. No VT, SVT, atrial fibrillation, high degree block, or pauses noted. Isolated atrial and ventricular ectopy was rare (<1%). Patient had a min HR of 47 bpm, max HR of 174 bpm, and avg HR of 86 bpm. Predominant underlying rhythm was Sinus Rhythm. There were 15 patient triggered events. 12 were normal sinus rhythm, 1 was normal sinus rhythm with PVC, 1 was sinus tachycardia with PVC, and 1 was sinus rhythm with PAC.   Recent Labs: 11/05/2018: Hemoglobin 13.1; Platelets 335 01/18/2019: ALT 12; BUN 12; Creatinine, Ser 0.93; Magnesium 2.0; Potassium 4.0; Sodium 139  Recent Lipid Panel    Component Value Date/Time   CHOL 196 05/31/2018 1023   TRIG 76 05/31/2018 1023   HDL 53 05/31/2018 1023   CHOLHDL 3.7 05/31/2018 1023   CHOLHDL 3 09/24/2014 0837   VLDL 9.6 09/24/2014 0837   LDLCALC 128 (H) 05/31/2018 1023    Physical Exam:    VS:  BP 100/70 (BP Location: Left Arm, Patient Position: Sitting, Cuff Size: Normal)   Pulse 79   Ht 5\' 9"  (1.753 m)   Wt 210 lb (95.3 kg)   LMP 06/13/2019   SpO2 99%   BMI 31.01 kg/m     Wt Readings from Last 3 Encounters:  06/22/19 210 lb (95.3 kg)  01/17/19 185 lb (83.9 kg)  01/03/19  188 lb (85.3 kg)     GEN: Well nourished, well developed in no acute distress HEENT: Normal NECK: No JVD; No carotid bruits LYMPHATICS: No lymphadenopathy CARDIAC: S1S2 noted,RRR, no murmurs, rubs, gallops RESPIRATORY:  Clear to auscultation without rales, wheezing or rhonchi  ABDOMEN: Soft, non-tender, non-distended, +bowel sounds, no guarding. EXTREMITIES: No edema, No cyanosis, no clubbing MUSCULOSKELETAL:  No edema; No deformity  SKIN: Warm and dry NEUROLOGIC:  Alert and oriented x 3, non-focal PSYCHIATRIC:  Normal affect, good insight  ASSESSMENT:    1. Chest pain, unspecified type   2. Palpitations   3. Dizziness    PLAN:     1.  I was able to review her previous ambulatory monitoring studies.  She tells me that the palpitations now are more than prior to her lab studies and states that now she is experiencing dizziness with these palpitations.  Therefore at this time we can repeat her ambulatory monitoring study for 14-day.  Further recommendations will be made upon review.    2.  In terms of her chest pain she has had normal treadmill stress test about a year ago.  She really is low probability for coronary artery disease.  Given her mother has coronary artery disease and she tells me of alcohol had myocardial infarction in his early 23s it is reasonable to further r risk stratify using a coronary CT calcium scoring.    3.  I do also believe anxiety is playing a role in her palpitations and she is currently being treated with Klonopin 0.5 mg twice daily.   The patient is in agreement with the above plan. The patient left the office in stable condition.  The patient will follow up in 2 months.   Medication Adjustments/Labs and Tests Ordered: Current medicines are reviewed at length with the patient today.  Concerns regarding medicines are outlined above.  Orders Placed This Encounter  Procedures  . CT CARDIAC SCORING  . Basic Metabolic Panel (BMET)  . Magnesium  . TSH   . LONG TERM MONITOR (3-14 DAYS)  . EKG 12-Lead   No orders of the defined types were placed in this encounter.   Patient Instructions  Medication Instructions:  Your physician recommends that you continue on your current medications as directed. Please refer to the Current Medication list given to you today.  *If you need a refill on your cardiac medications before your next appointment, please call your pharmacy*  Lab Work: Your physician recommends that you return for lab work in: TODAY BMP,Magnesium,TSH  If you have labs (blood work) drawn today and your tests are completely normal, you will receive your results only by: Marland Kitchen MyChart Message (if you have MyChart) OR . A paper copy in the mail If you have any lab test that is abnormal or we need to change your treatment, we will call you to review the results.  Testing/Procedures: Dr Harriet Masson recommends a Calcium  Score CT. You will be contacted by a scheduler for an appointment and time. There is a 1 time cost of $150 out of pocket insurance does not cover.  A zio monitor was placed today. It will remain on for 14 days. You will then return monitor and event diary in provided box. It takes 1-2 weeks for report to be downloaded and returned to [place]. We will call you with the results. If monitor falls off or has orange flashing light, please call Zio for further instructions.    Follow-Up: At [facility], you and your health needs are our priority.  As part of our continuing mission to provide you with exceptional heart care, we have created designated Provider Care Teams.  These Care Teams include your primary Cardiologist (physician)  and Advanced Practice Providers (APPs -  Physician Assistants and Nurse Practitioners) who all work together to provide you with the care you need, when you need it.  Your next appointment:   2 months  The format for your next appointment:   In Person  Provider:   Berniece Salines, DO  Other Instructions        Adopting a Healthy Lifestyle.  Know what a healthy weight is for you (roughly BMI <25) and aim to maintain this   Aim for 7+ servings of fruits and vegetables daily   65-80+ fluid ounces of water or unsweet tea for healthy kidneys   Limit to max 1 drink of alcohol per day; avoid smoking/tobacco   Limit animal fats in diet for cholesterol and heart health - choose grass fed whenever available   Avoid highly processed foods, and foods high in saturated/trans fats   Aim for low stress - take time to unwind and care for your mental health   Aim for 150 min of moderate intensity exercise weekly for heart health, and weights twice weekly for bone health   Aim for 7-9 hours of sleep daily   When it comes to diets, agreement about the perfect plan isnt easy to find, even among the experts. Experts at the [employer] developed an idea known as the Healthy Eating Plate. Just imagine a plate divided into logical, healthy portions.   The emphasis is on diet quality:   Load up on vegetables and fruits - one-half of your plate: Aim for color and variety, and remember that potatoes dont count.   Go for whole grains - one-quarter of your plate: Whole wheat, barley, wheat berries, quinoa, oats, brown rice, and foods made with them. If you want pasta, go with whole wheat pasta.   Protein power - one-quarter of your plate: Fish, chicken, beans, and nuts are all healthy, versatile protein sources. Limit red meat.   The diet, however, does go beyond the plate, offering a few other suggestions.   Use healthy plant oils, such as olive, canola, soy, corn, sunflower and peanut. Check the labels, and avoid partially hydrogenated oil, which have unhealthy trans fats.   If youre thirsty, drink water. Coffee and tea are good in moderation, but skip sugary drinks and limit milk and dairy products to one or two daily servings.   The type of carbohydrate in the diet is more  important than the amount. Some sources of carbohydrates, such as vegetables, fruits, whole grains, and beans-are healthier than others.   Finally, stay active  Signed, Berniece Salines, DO  06/22/2019 12:20 PM    [facility]

## 2019-06-23 ENCOUNTER — Ambulatory Visit (INDEPENDENT_AMBULATORY_CARE_PROVIDER_SITE_OTHER)
Admission: RE | Admit: 2019-06-23 | Discharge: 2019-06-23 | Disposition: A | Discharge status: Home / Self Care | Payer: Self-pay | Source: Ambulatory Visit | Attending: Cardiology | Admitting: Cardiology

## 2019-06-23 ENCOUNTER — Other Ambulatory Visit: Payer: Self-pay

## 2019-06-23 DIAGNOSIS — R002 Palpitations: Secondary | ICD-10-CM

## 2019-06-23 LAB — BASIC METABOLIC PANEL
BUN/Creatinine Ratio: 11 (ref 9–23)
BUN: 10 mg/dL (ref 6–20)
CO2: 24 mmol/L (ref 20–29)
Calcium: 9.9 mg/dL (ref 8.7–10.2)
Chloride: 103 mmol/L (ref 96–106)
Creatinine, Ser: 0.89 mg/dL (ref 0.57–1.00)
GFR calc Af Amer: 99 mL/min/{1.73_m2} (ref >59)
GFR calc non Af Amer: 86 mL/min/{1.73_m2} (ref >59)
Glucose: 97 mg/dL (ref 65–99)
Potassium: 4.6 mmol/L (ref 3.5–5.2)
Sodium: 141 mmol/L (ref 134–144)

## 2019-06-23 LAB — TSH: TSH: 0.775 u[IU]/mL (ref 0.450–4.500)

## 2019-06-23 LAB — MAGNESIUM: Magnesium: 2.1 mg/dL (ref 1.6–2.3)

## 2019-06-24 LAB — NOVEL CORONAVIRUS, NAA (HOSP ORDER, SEND-OUT TO REF LAB; TAT 18-24 HRS): SARS-CoV-2, NAA: NOT DETECTED

## 2019-06-30 ENCOUNTER — Other Ambulatory Visit: Payer: Self-pay

## 2019-06-30 DIAGNOSIS — Z20822 Contact with and (suspected) exposure to covid-19: Secondary | ICD-10-CM

## 2019-07-01 LAB — NOVEL CORONAVIRUS, NAA: SARS-CoV-2, NAA: NOT DETECTED

## 2019-08-03 ENCOUNTER — Telehealth: Payer: Self-pay | Admitting: *Deleted

## 2019-08-03 NOTE — Telephone Encounter (Signed)
-----   Message from Berniece Salines, DO sent at 08/02/2019 12:08 PM EST ----- Please let patient know that her monitor shows some symptomatic ventricular ectopy she scheduled for December 28, we can see her earlier if needed or scars at that time.

## 2019-08-03 NOTE — Telephone Encounter (Signed)
Telephone call to patient.Left message with monitor results and to call and make an earlier appointment than 08/28/19.

## 2019-08-07 ENCOUNTER — Other Ambulatory Visit: Payer: Self-pay

## 2019-08-07 ENCOUNTER — Encounter: Payer: Self-pay | Admitting: *Deleted

## 2019-08-07 ENCOUNTER — Telehealth: Payer: Self-pay | Admitting: Physician Assistant

## 2019-08-07 ENCOUNTER — Encounter: Payer: Self-pay | Admitting: Cardiology

## 2019-08-07 ENCOUNTER — Ambulatory Visit (INDEPENDENT_AMBULATORY_CARE_PROVIDER_SITE_OTHER): Payer: 59 | Admitting: Cardiology

## 2019-08-07 ENCOUNTER — Other Ambulatory Visit: Payer: Self-pay | Admitting: Physician Assistant

## 2019-08-07 VITALS — BP 98/80 | HR 77 | Ht 69.0 in | Wt 221.0 lb

## 2019-08-07 DIAGNOSIS — I493 Ventricular premature depolarization: Secondary | ICD-10-CM

## 2019-08-07 DIAGNOSIS — E782 Mixed hyperlipidemia: Secondary | ICD-10-CM

## 2019-08-07 MED ORDER — METOPROLOL TARTRATE 25 MG PO TABS: ORAL_TABLET | ORAL | 1 refills | Status: DC

## 2019-08-07 MED ORDER — CLONAZEPAM 0.5 MG PO TABS: 0.5000 mg | ORAL_TABLET | Freq: Two times a day (BID) | ORAL | 0 refills | Status: DC | PRN

## 2019-08-07 NOTE — Progress Notes (Signed)
Cardiology Office Note:    Date:  08/07/2019   ID:  Brandy [name], DOB 1986/07/20, MRN [id number]  PCP:  [name], Brandy G, MD  Cardiologist:  Buford Dresser, MD  Electrophysiologist:  None   Referring MD: [name], Brandy G, MD   Chief Complaint  Patient presents with   Irregular Heart Beat    History of Present Illness:    Brandy [name] is a 33 y.o. female with a hx of anxiety and postpartum depression.  The patient was initially seen in consultation on May 31, 2018 for complaints of palpitation and shortness of breath. During her initial consultation it was noted that the patient had seen Dr. Nehemiah [name] at [facility] clinic and had had previous work-up with a normal echocardiogram (July 2019) and a treadmill SPECT stress test (which was also July 2019) which I was able to review in Care Everywhere.  Her Holter August 29 showed sinus tachycardia.  The conclusion at that visit a 48-hour Holter monitor was placed on patient she was encouraged to continue hydration as well as continue treatment for her anxiety.  Review of the Holter monitors did not show predominant sinus arrhythmia with 1 PAC.  There are no ventricular ectopy or no pauses.  Her average heart rate was 82 bpm.  She did continue to have palpitations therefore a longer monitor was placed on patient for 6 days which showed dominant rhythm of sinus rhythm.  Isolated atrial ventricular ectopy.  12 of her triggered events were associated with sinus rhythm, one was normal sinus rhythm with PVC, one was sinus tachycardia with PVC and one was sinus rhythm with PAC.  Her most recent encounter was Jan 03, 2019 and this was a telemetry medicine visit.  At that time it was discussed with the patient to pursue noncardiac work-up.   I did see the patient on the June 22, 2019 at which time she was experiencing worsening palpitations with associated dizziness.  She also told me that her diet he was being aggressively treated.  Therefore  ZIO monitor was placed on patient.  Today she is here to discuss the results.  Past Medical History:  Diagnosis Date   Anxiety    Anxiety associated with birthing process 06/14/2018   B12 deficiency 07/21/2016   Chronic mixed headache syndrome 06/14/2018   Depression    DUB (dysfunctional uterine bleeding) 06/04/2011   Enlarged thyroid gland 12/30/2015   Fatigue 12/30/2015   Gallstones    Generalized anxiety disorder 06/09/2016   GERD (gastroesophageal reflux disease) 02/19/2011   History of nephrolithiasis    Kidney stone    Medication overuse headache 06/14/2018   Menorrhagia 11/09/2018   Migraine    Missed abortion 11/09/2018   NEPHROLITHIASIS, HX OF 12/16/2007   Qualifier: Diagnosis of  By: Sherren Mocha MD, Jory Ee    Palpitations 03/21/2018   Pituitary tumor    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   Precordial pain 03/21/2018   Serum calcium elevated 12/28/2013   SVD (spontaneous vaginal delivery) 02/02/2018   Tendinitis of right ankle 08/14/2014   TMJ syndrome 05/30/2012   Vitamin D deficiency     Past Surgical History:  Procedure Laterality Date   CHOLECYSTECTOMY  2014   KIDNEY STONE SURGERY     x 5   MOUTH SURGERY     TYMPANOSTOMY TUBE PLACEMENT      Current Medications: Current Meds  Medication Sig   acetaminophen (TYLENOL) 500 MG tablet Take 1,000 mg by mouth every 6 (six) hours as needed for headache.  Cholecalciferol (VITAMIN D) 2000 units CAPS Take 2,000 Units by mouth daily.   clonazePAM (KLONOPIN) 0.5 MG tablet Take 1 tablet (0.5 mg total) by mouth 2 (two) times daily as needed for anxiety.   esomeprazole (NEXIUM) 20 MG capsule Take 1 capsule (20 mg total) by mouth 2 (two) times daily before a meal.   Misc Natural Products (TART CHERRY ADVANCED PO) Take 1,000 mg by mouth daily.   ondansetron (ZOFRAN ODT) 8 MG disintegrating tablet Take 1 tablet (8 mg total) by mouth every 8 (eight) hours as needed for nausea or vomiting.      Allergies:   Metoclopramide, Penicillins, Erenumab-aooe, Fluvoxamine, Sumatriptan, and Zoloft [sertraline hcl]   Social History   Socioeconomic History   Marital status: Married    Spouse name: Jonathon   Number of children: 1   Years of education: Not on file   Highest education level: Bachelor's degree (e.g., BA, AB, BS)  Occupational History   Occupation: Marine scientist: [place] resource strain: Not on file   Food insecurity    Worry: Not on file    Inability: Not on file   Transportation needs    Medical: Not on file    Non-medical: Not on file  Tobacco Use   Smoking status: Never Smoker   Smokeless tobacco: Never Used  Substance and Sexual Activity   Alcohol use: Not Currently    Frequency: Never   Drug use: No   Sexual activity: Yes  Lifestyle   Physical activity    Days per week: Not on file    Minutes per session: Not on file   Stress: Not on file  Relationships   Social connections    Talks on phone: Not on file    Gets together: Not on file    Attends religious service: Not on file    Active member of club or organization: Not on file    Attends meetings of clubs or organizations: Not on file    Relationship status: Not on file  Other Topics Concern   Not on file  Social History Narrative   Patient is right-handed. She lives with her husband and child in a split-level home.    Daughter born 01/2018   She avoids caffeine.    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  ROS:   Review of Systems  Constitution: Negative for decreased appetite, fever and weight gain.  HENT: Negative for congestion, ear discharge, hoarse voice and sore throat.   Eyes: Negative for discharge, redness, vision loss in right eye and visual halos.  Cardiovascular: Negative for chest pain, dyspnea on exertion, leg swelling, orthopnea and palpitations.  Respiratory: Negative for cough, hemoptysis, shortness of breath and snoring.   Endocrine: Negative for heat intolerance and polyphagia.  Hematologic/Lymphatic: Negative for bleeding problem. Does not bruise/bleed easily.  Skin: Negative for flushing,  nail changes, rash and suspicious lesions.  Musculoskeletal: Negative for arthritis, joint pain, muscle cramps, myalgias, neck pain and stiffness.  Gastrointestinal: Negative for abdominal pain, bowel incontinence, diarrhea and excessive appetite.  Genitourinary: Negative for decreased libido, genital sores and incomplete emptying.  Neurological: Negative for brief paralysis, focal weakness, headaches and loss of balance.  Psychiatric/Behavioral: Negative for altered mental status, depression and suicidal ideas.  Allergic/Immunologic: Negative for HIV exposure and persistent infections.    EKGs/Labs/Other Studies Reviewed:    The following studies were reviewed today:   EKG: Sinus rhythm with arrhythmia, heart rate 70 bpm.   Zio Monitor The patient wore the monitor for 7 days, starting 06/22/2019.  Indication: Palpitations The minimum heart rate was 51 bpm, maximum heart rate was 175 bpm, and average heart rate 84 bpm.  The predominant underlying rhythm was Sinus Rhythm. Premature atrial complexes were rare (<1.0%). Premature ventricular complexes were rare (<1.0%). There were 7 patient triggered event: 2 was associated with  ventricular ectopy, 5 was associated with sinus rhythm.  No ventricular tachycardia, No AV block, No pause, no supraventricular tachycardia and no atrial fibrillation was present.  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  Recent Labs: 11/05/2018: Hemoglobin 13.1; Platelets 335 01/18/2019: ALT 12 06/22/2019: BUN 10; Creatinine, Ser 0.89; Magnesium 2.1; Potassium 4.6; Sodium 141; TSH 0.775  Recent Lipid Panel    Component Value Date/Time   CHOL 196 05/31/2018 1023   TRIG 76 05/31/2018 1023   HDL 53 05/31/2018 1023   CHOLHDL 3.7 05/31/2018 1023   CHOLHDL 3 09/24/2014 0837   VLDL 9.6 09/24/2014 0837   LDLCALC 128 (H) 05/31/2018 1023    Physical Exam:    VS:  BP 98/80 (BP Location: Right Arm, Patient Position: Sitting, Cuff Size: Normal)    Pulse 77    Ht 5\' 9"  (1.753 m)    Wt 221 lb (100.2 kg)    SpO2 98%    BMI 32.64 kg/m     Wt Readings from Last 3 Encounters:  08/07/19 221 lb (100.2 kg)  06/22/19 210 lb (95.3 kg)  01/17/19 185 lb (83.9 kg)     GEN: Well nourished, well developed in no acute distress HEENT: Normal NECK: No JVD; No carotid bruits LYMPHATICS: No lymphadenopathy CARDIAC: S1S2 noted,RRR, no murmurs, rubs, gallops RESPIRATORY:  Clear to auscultation without rales, wheezing or rhonchi  ABDOMEN: Soft, non-tender, non-distended, +bowel sounds, no guarding. EXTREMITIES: No edema, No cyanosis, no clubbing MUSCULOSKELETAL:  No edema; No deformity  SKIN: Warm and dry NEUROLOGIC:  Alert and oriented x 3, non-focal PSYCHIATRIC:  Normal affect, good insight  ASSESSMENT:    1. Symptomatic PVCs    PLAN:    1.  Her monitor does show evidence of symptomatic PVCs, and here in the office she tells me the symptoms are worsening.  Therefore I discussed with the patient in great detail a trial of Lopressor as needed.  Lopressor 12.5 mg will be ordered as needed.  She was educated on if she experience symptoms to take her blood pressure is greater  than 110 mmHg to take a dose of Lopressor 12.5 mg, in 6 for 30 minutes after taking this medication.  I am hoping to get some relief from this.  If you are able to show improvement on this medication as needed I will consider keeping her on this long-term.  2.  Hyperlipidemia-continue lifestyle modification.  The patient is in agreement with the above plan. The patient left the office in stable  condition.  The patient will follow up in 3 months or sooner if needed   Medication Adjustments/Labs and Tests Ordered: Current medicines are reviewed at length with the patient today.  Concerns regarding medicines are outlined above.  Orders Placed This Encounter  Procedures   EKG 12-Lead   Meds ordered this encounter  Medications   metoprolol tartrate (LOPRESSOR) 25 MG tablet    Sig: Take 12.5 mg(1/2 tab) as needed if systolic (top number) blood pressure greater than 110    Dispense:  45 tablet    Refill:  1    Patient Instructions  Medication Instructions:  Your physician has recommended you make the following change in your medication:   START: Metoprolol 25 mg Take 1/2 tab(12.5 mg) as needed for palpitations. Take only if systolic (top number) blood pressure is greater than 110 and sit down for 30 minutes afterwards.  *If you need a refill on your cardiac medications before your next appointment, please call your pharmacy*  Lab Work: None If you have labs (blood work) drawn today and your tests are completely normal, you will receive your results only by:  [place] (if you have MyChart) OR  A paper copy in the mail If you have any lab test that is abnormal or we need to change your treatment, we will call you to review the results.  Testing/Procedures: None  Follow-Up: At [facility], you and your health needs are our priority.  As part of our continuing mission to provide you with exceptional heart care, we have created designated Provider Care Teams.  These Care  Teams include your primary Cardiologist (physician) and Advanced Practice Providers (APPs -  Physician Assistants and Nurse Practitioners) who all work together to provide you with the care you need, when you need it.  Your next appointment:   3 month(s)  The format for your next appointment:   In Person  Provider:   Berniece Salines, DO  Other Instructions Metoprolol tablets What is this medicine? METOPROLOL (me TOE proe lole) is a beta-blocker. Beta-blockers reduce the workload on the heart and help it to beat more regularly. This medicine is used to treat high blood pressure and to prevent chest pain. It is also used to after a heart attack and to prevent an additional heart attack from occurring. This medicine may be used for other purposes; ask your health care provider or pharmacist if you have questions. COMMON BRAND NAME(S): Lopressor What should I tell my health care provider before I take this medicine? They need to know if you have any of these conditions:  diabetes  heart or vessel disease like slow heart rate, worsening heart failure, heart block, sick sinus syndrome or Raynaud's disease  kidney disease  liver disease  lung or breathing disease, like asthma or emphysema  pheochromocytoma  thyroid disease  an unusual or allergic reaction to metoprolol, other beta-blockers, medicines, foods, dyes, or preservatives  pregnant or trying to get pregnant  breast-feeding How should I use this medicine? Take this medicine by mouth with a drink of water. Follow the directions on the prescription label. Take this medicine immediately after meals. Take your doses at regular intervals. Do not take more medicine than directed. Do not stop taking this medicine suddenly. This could lead to serious heart-related effects. Talk to your pediatrician regarding the use of this medicine in children. Special care may be needed. Overdosage: If you think you have taken too much of this  medicine contact a poison control  center or emergency room at once. NOTE: This medicine is only for you. Do not share this medicine with others. What if I miss a dose? If you miss a dose, take it as soon as you can. If it is almost time for your next dose, take only that dose. Do not take double or extra doses. What may interact with this medicine? This medicine may interact with the following medications:  certain medicines for blood pressure, heart disease, irregular heart beat  certain medicines for depression like monoamine oxidase (MAO) inhibitors, fluoxetine, or paroxetine  clonidine  dobutamine  epinephrine  isoproterenol  reserpine This list may not describe all possible interactions. Give your health care provider a list of all the medicines, herbs, non-prescription drugs, or dietary supplements you use. Also tell them if you smoke, drink alcohol, or use illegal drugs. Some items may interact with your medicine. What should I watch for while using this medicine? Visit your doctor or health care professional for regular check ups. Contact your doctor right away if your symptoms worsen. Check your blood pressure and pulse rate regularly. Ask your health care professional what your blood pressure and pulse rate should be, and when you should contact them. You may get drowsy or dizzy. Do not drive, use machinery, or do anything that needs mental alertness until you know how this medicine affects you. Do not sit or stand up quickly, especially if you are an older patient. This reduces the risk of dizzy or fainting spells. Contact your doctor if these symptoms continue. Alcohol may interfere with the effect of this medicine. Avoid alcoholic drinks. This medicine may increase blood sugar. Ask your healthcare provider if changes in diet or medicines are needed if you have diabetes. What side effects may I notice from receiving this medicine? Side effects that you should report to your  doctor or health care professional as soon as possible:  allergic reactions like skin rash, itching or hives  cold or numb hands or feet  depression  difficulty breathing  faint  fever with sore throat  irregular heartbeat, chest pain  rapid weight gain   signs and symptoms of high blood sugar such as being more thirsty or hungry or having to urinate more than normal. You may also feel very tired or have blurry vision.  swollen legs or ankles Side effects that usually do not require medical attention (report to your doctor or health care professional if they continue or are bothersome):  anxiety or nervousness  change in sex drive or performance  dry skin  headache  nightmares or trouble sleeping  short term memory loss  stomach upset or diarrhea This list may not describe all possible side effects. Call your doctor for medical advice about side effects. You may report side effects to FDA at 1-800-FDA-1088. Where should I keep my medicine? Keep out of the reach of children. Store at room temperature between 15 and 30 degrees C (59 and 86 degrees F). Throw away any unused medicine after the expiration date. NOTE: This sheet is a summary. It may not cover all possible information. If you have questions about this medicine, talk to your doctor, pharmacist, or health care provider.  2020 Elsevier/Gold Standard (2018-06-07 11:15:23)      Adopting a Healthy Lifestyle.  Know what a healthy weight is for you (roughly BMI <25) and aim to maintain this   Aim for 7+ servings of fruits and vegetables daily   65-80+ fluid ounces of water or  unsweet tea for healthy kidneys   Limit to max 1 drink of alcohol per day; avoid smoking/tobacco   Limit animal fats in diet for cholesterol and heart health - choose grass fed whenever available   Avoid highly processed foods, and foods high in saturated/trans fats   Aim for low stress - take time to unwind and care for your  mental health   Aim for 150 min of moderate intensity exercise weekly for heart health, and weights twice weekly for bone health   Aim for 7-9 hours of sleep daily   When it comes to diets, agreement about the perfect plan isnt easy to find, even among the experts. Experts at the [employer] developed an idea known as the Healthy Eating Plate. Just imagine a plate divided into logical, healthy portions.   The emphasis is on diet quality:   Load up on vegetables and fruits - one-half of your plate: Aim for color and variety, and remember that potatoes dont count.   Go for whole grains - one-quarter of your plate: Whole wheat, barley, wheat berries, quinoa, oats, brown rice, and foods made with them. If you want pasta, go with whole wheat pasta.   Protein power - one-quarter of your plate: Fish, chicken, beans, and nuts are all healthy, versatile protein sources. Limit red meat.   The diet, however, does go beyond the plate, offering a few other suggestions.   Use healthy plant oils, such as olive, canola, soy, corn, sunflower and peanut. Check the labels, and avoid partially hydrogenated oil, which have unhealthy trans fats.   If youre thirsty, drink water. Coffee and tea are good in moderation, but skip sugary drinks and limit milk and dairy products to one or two daily servings.   The type of carbohydrate in the diet is more important than the amount. Some sources of carbohydrates, such as vegetables, fruits, whole grains, and beans-are healthier than others.   Finally, stay active  Signed, Berniece Salines, DO  08/07/2019 2:24 PM    [place]

## 2019-08-07 NOTE — Telephone Encounter (Signed)
Rx sent 

## 2019-08-07 NOTE — Telephone Encounter (Signed)
PT requests Klonopin RF. Has scheduled appt next avail 09/21/2019. Please send to [facility] in [place], [place]

## 2019-08-07 NOTE — Patient Instructions (Signed)
Medication Instructions:  Your physician has recommended you make the following change in your medication:   START: Metoprolol 25 mg Take 1/2 tab(12.5 mg) as needed for palpitations. Take only if systolic (top number) blood pressure is greater than 110 and sit down for 30 minutes afterwards.  *If you need a refill on your cardiac medications before your next appointment, please call your pharmacy*  Lab Work: None If you have labs (blood work) drawn today and your tests are completely normal, you will receive your results only by: Marland Kitchen MyChart Message (if you have MyChart) OR . A paper copy in the mail If you have any lab test that is abnormal or we need to change your treatment, we will call you to review the results.  Testing/Procedures: None  Follow-Up: At [facility], you and your health needs are our priority.  As part of our continuing mission to provide you with exceptional heart care, we have created designated Provider Care Teams.  These Care Teams include your primary Cardiologist (physician) and Advanced Practice Providers (APPs -  Physician Assistants and Nurse Practitioners) who all work together to provide you with the care you need, when you need it.  Your next appointment:   3 month(s)  The format for your next appointment:   In Person  Provider:   Berniece Salines, DO  Other Instructions Metoprolol tablets What is this medicine? METOPROLOL (me TOE proe lole) is a beta-blocker. Beta-blockers reduce the workload on the heart and help it to beat more regularly. This medicine is used to treat high blood pressure and to prevent chest pain. It is also used to after a heart attack and to prevent an additional heart attack from occurring. This medicine may be used for other purposes; ask your health care provider or pharmacist if you have questions. COMMON BRAND NAME(S): Lopressor What should I tell my health care provider before I take this medicine? They need to know if you  have any of these conditions:  diabetes  heart or vessel disease like slow heart rate, worsening heart failure, heart block, sick sinus syndrome or Raynaud's disease  kidney disease  liver disease  lung or breathing disease, like asthma or emphysema  pheochromocytoma  thyroid disease  an unusual or allergic reaction to metoprolol, other beta-blockers, medicines, foods, dyes, or preservatives  pregnant or trying to get pregnant  breast-feeding How should I use this medicine? Take this medicine by mouth with a drink of water. Follow the directions on the prescription label. Take this medicine immediately after meals. Take your doses at regular intervals. Do not take more medicine than directed. Do not stop taking this medicine suddenly. This could lead to serious heart-related effects. Talk to your pediatrician regarding the use of this medicine in children. Special care may be needed. Overdosage: If you think you have taken too much of this medicine contact a poison control center or emergency room at once. NOTE: This medicine is only for you. Do not share this medicine with others. What if I miss a dose? If you miss a dose, take it as soon as you can. If it is almost time for your next dose, take only that dose. Do not take double or extra doses. What may interact with this medicine? This medicine may interact with the following medications:  certain medicines for blood pressure, heart disease, irregular heart beat  certain medicines for depression like monoamine oxidase (MAO) inhibitors, fluoxetine, or paroxetine  clonidine  dobutamine  epinephrine  isoproterenol  reserpine This list may not describe all possible interactions. Give your health care provider a list of all the medicines, herbs, non-prescription drugs, or dietary supplements you use. Also tell them if you smoke, drink alcohol, or use illegal drugs. Some items may interact with your medicine. What should I  watch for while using this medicine? Visit your doctor or health care professional for regular check ups. Contact your doctor right away if your symptoms worsen. Check your blood pressure and pulse rate regularly. Ask your health care professional what your blood pressure and pulse rate should be, and when you should contact them. You may get drowsy or dizzy. Do not drive, use machinery, or do anything that needs mental alertness until you know how this medicine affects you. Do not sit or stand up quickly, especially if you are an older patient. This reduces the risk of dizzy or fainting spells. Contact your doctor if these symptoms continue. Alcohol may interfere with the effect of this medicine. Avoid alcoholic drinks. This medicine may increase blood sugar. Ask your healthcare provider if changes in diet or medicines are needed if you have diabetes. What side effects may I notice from receiving this medicine? Side effects that you should report to your doctor or health care professional as soon as possible:  allergic reactions like skin rash, itching or hives  cold or numb hands or feet  depression  difficulty breathing  faint  fever with sore throat  irregular heartbeat, chest pain  rapid weight gain   signs and symptoms of high blood sugar such as being more thirsty or hungry or having to urinate more than normal. You may also feel very tired or have blurry vision.  swollen legs or ankles Side effects that usually do not require medical attention (report to your doctor or health care professional if they continue or are bothersome):  anxiety or nervousness  change in sex drive or performance  dry skin  headache  nightmares or trouble sleeping  short term memory loss  stomach upset or diarrhea This list may not describe all possible side effects. Call your doctor for medical advice about side effects. You may report side effects to FDA at 1-800-FDA-1088. Where should I  keep my medicine? Keep out of the reach of children. Store at room temperature between 15 and 30 degrees C (59 and 86 degrees F). Throw away any unused medicine after the expiration date. NOTE: This sheet is a summary. It may not cover all possible information. If you have questions about this medicine, talk to your doctor, pharmacist, or health care provider.  2020 Elsevier/Gold Standard (2018-06-07 11:15:23)

## 2019-08-14 ENCOUNTER — Other Ambulatory Visit: Payer: Self-pay | Admitting: Family Medicine

## 2019-08-14 DIAGNOSIS — E041 Nontoxic single thyroid nodule: Secondary | ICD-10-CM

## 2019-08-16 ENCOUNTER — Other Ambulatory Visit (HOSPITAL_COMMUNITY): Payer: Self-pay | Admitting: Family Medicine

## 2019-08-16 ENCOUNTER — Encounter (HOSPITAL_COMMUNITY): Payer: Self-pay | Admitting: Family Medicine

## 2019-08-16 DIAGNOSIS — R002 Palpitations: Secondary | ICD-10-CM

## 2019-08-28 ENCOUNTER — Ambulatory Visit: Payer: 59 | Admitting: Cardiology

## 2019-08-29 ENCOUNTER — Ambulatory Visit
Admission: RE | Admit: 2019-08-29 | Discharge: 2019-08-29 | Disposition: A | Discharge status: Home / Self Care | Payer: 59 | Source: Ambulatory Visit | Attending: Family Medicine | Admitting: Family Medicine

## 2019-08-29 DIAGNOSIS — E041 Nontoxic single thyroid nodule: Secondary | ICD-10-CM

## 2019-08-30 ENCOUNTER — Ambulatory Visit (HOSPITAL_COMMUNITY): Payer: 59 | Attending: Cardiology

## 2019-08-30 ENCOUNTER — Other Ambulatory Visit: Payer: Self-pay

## 2019-08-30 DIAGNOSIS — R002 Palpitations: Secondary | ICD-10-CM | POA: Diagnosis not present

## 2019-09-21 ENCOUNTER — Ambulatory Visit: Payer: 59 | Admitting: Physician Assistant

## 2019-09-26 ENCOUNTER — Ambulatory Visit: Payer: 59 | Attending: Internal Medicine

## 2019-09-26 DIAGNOSIS — Z20822 Contact with and (suspected) exposure to covid-19: Secondary | ICD-10-CM

## 2019-09-27 LAB — NOVEL CORONAVIRUS, NAA: SARS-CoV-2, NAA: NOT DETECTED

## 2019-10-25 ENCOUNTER — Telehealth: Payer: Self-pay | Admitting: Physician Assistant

## 2019-10-25 ENCOUNTER — Other Ambulatory Visit: Payer: Self-pay

## 2019-10-25 ENCOUNTER — Ambulatory Visit: Payer: 59 | Admitting: Physician Assistant

## 2019-10-25 MED ORDER — CLONAZEPAM 0.5 MG PO TABS: 0.5000 mg | ORAL_TABLET | Freq: Two times a day (BID) | ORAL | 0 refills | Status: DC | PRN

## 2019-10-25 NOTE — Telephone Encounter (Signed)
RX refill for Clonazepam #60 no refills called into [facility]

## 2019-10-25 NOTE — Telephone Encounter (Signed)
Patient called and needs a refill on her klonopin. She had ana ppt today but teresa is sick. She is rs for 3/12 but is completely out as of yesterday. [facility] [place]

## 2019-11-03 ENCOUNTER — Ambulatory Visit (INDEPENDENT_AMBULATORY_CARE_PROVIDER_SITE_OTHER): Payer: 59 | Admitting: Cardiology

## 2019-11-03 ENCOUNTER — Encounter: Payer: Self-pay | Admitting: Cardiology

## 2019-11-03 ENCOUNTER — Other Ambulatory Visit: Payer: Self-pay

## 2019-11-03 VITALS — BP 100/70 | HR 93 | Ht 69.0 in | Wt 229.0 lb

## 2019-11-03 DIAGNOSIS — R0789 Other chest pain: Secondary | ICD-10-CM

## 2019-11-03 DIAGNOSIS — E669 Obesity, unspecified: Secondary | ICD-10-CM

## 2019-11-03 DIAGNOSIS — E782 Mixed hyperlipidemia: Secondary | ICD-10-CM | POA: Diagnosis not present

## 2019-11-03 DIAGNOSIS — I493 Ventricular premature depolarization: Secondary | ICD-10-CM | POA: Diagnosis not present

## 2019-11-03 MED ORDER — METOPROLOL SUCCINATE ER 25 MG PO TB24: 12.5000 mg | ORAL_TABLET | Freq: Every day | ORAL | 1 refills | Status: DC

## 2019-11-03 NOTE — Patient Instructions (Signed)
Medication Instructions:  Your physician has recommended you make the following change in your medication:   STOP : Metoprolol tartrate   START: Toprol XL(metoprolol succinate) 25 mg Take 1/2 tab(12.5 mg) daily  *If you need a refill on your cardiac medications before your next appointment, please call your pharmacy*   Lab Work: None If you have labs (blood work) drawn today and your tests are completely normal, you will receive your results only by: Marland Kitchen MyChart Message (if you have MyChart) OR . A paper copy in the mail If you have any lab test that is abnormal or we need to change your treatment, we will call you to review the results.   Testing/Procedures: NOne   Follow-Up: At [facility], you and your health needs are our priority.  As part of our continuing mission to provide you with exceptional heart care, we have created designated Provider Care Teams.  These Care Teams include your primary Cardiologist (physician) and Advanced Practice Providers (APPs -  Physician Assistants and Nurse Practitioners) who all work together to provide you with the care you need, when you need it.  We recommend signing up for the patient portal called "MyChart".  Sign up information is provided on this After Visit Summary.  MyChart is used to connect with patients for Virtual Visits (Telemedicine).  Patients are able to view lab/test results, encounter notes, upcoming appointments, etc.  Non-urgent messages can be sent to your provider as well.   To learn more about what you can do with MyChart, go to [email or web address].    Your next appointment:   1 month(s)  The format for your next appointment:   In Person  Provider:   Berniece Salines, DO   Other Instructions

## 2019-11-03 NOTE — Progress Notes (Signed)
Cardiology Office Note:    Date:  11/03/2019   ID:  Brandy [name], DOB Apr 16, 1986, MRN [id number]  PCP:  Lujean Amel, MD  Cardiologist:  Berniece Salines, DO  Electrophysiologist:  None   Referring MD: Martinique, Betty G, MD   Chief Complaint  Patient presents with  . Follow-up    History of Present Illness:    Brandy Hubbardis a 34 y.o.femalewith a hx of anxiety and postpartum depression. The patient was initially seen in consultation on May 31, 2018 for complaints of palpitation and shortness of breath. During her initial consultationit was noted that the patient [facility] [place] [facility] had [facility] previous work-up with a normal echocardiogram (July 2019) and a treadmill SPECT stress test (which was also July 2019) which I was able to review in Care Everywhere.Her Holter August 29 showed sinus tachycardia. The conclusion at that visit a 48-hour Holter monitor was placed on patient she was encouraged to continue hydration as well as continue treatment for her anxiety. Review of the Holter monitors did not show predominant sinus arrhythmia with 1 PAC. There are no ventricular ectopy or no pauses. Her average heart rate was 82 bpm.  She did continue to have palpitations therefore a longer monitor was placed on patient for 6 days which showed dominant rhythm of sinus rhythm. Isolated atrial ventricular ectopy. 12 of her triggered events were associated with sinus rhythm, one was normal sinus rhythm with PVC, one was sinus tachycardia with PVC and one was sinus rhythm with PAC.  Her most recent encounter was Jan 03, 2019 and this was a telemetry medicine visit. At that time it was discussed with the patient to pursue noncardiac work-up.   I did see the patient on the June 22, 2019 at which time she was experiencing worsening palpitations with associated dizziness. I placed a monitor on the patient at that time and I did see her in December 2020 and discussed the  result of her monitor showing symptomatic PVCs. Due to low blood pressure as needed Lopressor 12.5 was given to the patient.  She is here for follow-up visit. She is in tears and tells me that has been getting some relief from the Lopressor but for palpitations and also admits to intermittent chest tightness with her symptoms.  Past Medical History:  Diagnosis Date  . Abdominal pain, epigastric 02/18/2017  . Anterior neck pain 12/30/2015  . Anxiety   . Anxiety associated with birthing process 06/14/2018  . B12 deficiency 07/21/2016  . Chronic mixed headache syndrome 06/14/2018  . Depression   . DUB (dysfunctional uterine bleeding) 06/04/2011  . Dysphagia 07/20/2018  . Enlarged thyroid gland 12/30/2015  . Fatigue 12/30/2015  . Gallstones   . Generalized anxiety disorder 06/09/2016  . GERD (gastroesophageal reflux disease) 02/19/2011  . Headache, unspecified headache type 03/05/2018  . History of nephrolithiasis   . Kidney stone   . Medication overuse headache 06/14/2018  . Menorrhagia 11/09/2018  . Migraine   . Missed abortion 11/09/2018  . Nausea vomiting and diarrhea 12/28/2013  . Nausea without vomiting 02/18/2017  . NEPHROLITHIASIS, HX OF 12/16/2007   Qualifier: Diagnosis of  By: Sherren Mocha MD, Jory Ee   . Palpitations 03/21/2018  . Pituitary tumor   . 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 Precordial pain 03/21/2018  . Routine general medical examination at a health care facility 09/27/2014  . Serum calcium elevated 12/28/2013  . SVD (spontaneous vaginal delivery) 02/02/2018  . Tendinitis of right ankle 08/14/2014  .  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 Vitamin D deficiency     Past Surgical History:  Procedure Laterality Date  . CHOLECYSTECTOMY  2014  . KIDNEY STONE SURGERY     x 5  . MOUTH SURGERY    . TYMPANOSTOMY TUBE PLACEMENT      Current Medications: Current Meds  Medication Sig  . acetaminophen (TYLENOL) 500 MG tablet Take 1,000 mg by mouth every 6 (six) hours as needed for  headache.  . Cholecalciferol (VITAMIN D) 2000 units CAPS Take 2,000 Units by mouth daily.  . clonazePAM (KLONOPIN) 0.5 MG tablet Take 1 tablet (0.5 mg total) by mouth 2 (two) times daily as needed for anxiety.  . ergocalciferol (VITAMIN D2) 1.25 MG (50000 UT) capsule Vitamin D2 1,250 mcg (50,000 unit) capsule  Take 1 capsule every week by oral route as directed for 42 days.  . famotidine (PEPCID) 40 MG tablet Take 40 mg by mouth daily.  . ondansetron (ZOFRAN ODT) 8 MG disintegrating tablet Take 1 tablet (8 mg total) by mouth every 8 (eight) hours as needed for nausea or vomiting.  . [DISCONTINUED] metoprolol tartrate (LOPRESSOR) 25 MG tablet Take 12.5 mg(1/2 tab) as needed if systolic (top number) blood pressure greater than 110     Allergies:   Metoclopramide, Penicillins, Erenumab-aooe, Fluvoxamine, Sumatriptan, and Zoloft [sertraline hcl]   Social History   Socioeconomic History  . Marital status: Married    Spouse name: Jonathon  . Number of children: 1  . Years of education: Not on file  . Highest education level: Bachelor's degree (e.g., BA, AB, BS)  Occupational History  . Occupation: Marine scientist: [id number]  Tobacco Use  . Smoking status: Never Smoker  . Smokeless tobacco: Never Used  Substance and Sexual Activity  . Alcohol use: Not Currently  . Drug use: No  . Sexual activity: Yes  Other Topics Concern  . Not on file  Social History Narrative   Patient is right-handed. She lives with her husband and child in a split-level home.    Daughter born 01/2018   She avoids caffeine.    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 Difficulty of Paying Living Expenses: Not on file  Food Insecurity:   . Worried About Charity fundraiser in the Last Year: Not on file  . Ran Out of Food in the Last Year: Not on file   Transportation Needs:   . Lack of Transportation (Medical): Not on file  . Lack of Transportation (Non-Medical): Not on file  Physical Activity:   . Days of Exercise per Week: Not on file  . Minutes of Exercise per Session: Not on file  Stress:   . Feeling of Stress : Not on file  Social Connections:   . Frequency of Communication with Friends and Family: Not on file  . Frequency of Social Gatherings with Friends and Family: Not on file  . Attends Religious Services: Not on file  . Active Member of Clubs or Organizations: Not on file  . Attends Archivist Meetings: Not on file  . Marital Status: Not on file     Family History: The patient's family history includes Anxiety disorder in her sister; Arthritis in her mother; Asthma in her mother; Breast cancer in her maternal aunt; Colon cancer in  her maternal grandmother; Colon polyps in her mother; Diabetes in her father, maternal uncle, maternal uncle, mother, paternal aunt, and paternal grandmother; Heart disease in her mother; Hypertension in her father and mother; Lung cancer in her maternal aunt; Mental illness in her mother; Stomach cancer in her mother.  ROS:   Review of Systems  Constitution: Negative for decreased appetite, fever and weight gain.  HENT: Negative for congestion, ear discharge, hoarse voice and sore throat.   Eyes: Negative for discharge, redness, vision loss in right eye and visual halos.  Cardiovascular: Negative for chest pain, dyspnea on exertion, leg swelling, orthopnea and palpitations.  Respiratory: Negative for cough, hemoptysis, shortness of breath and snoring.   Endocrine: Negative for heat intolerance and polyphagia.  Hematologic/Lymphatic: Negative for bleeding problem. Does not bruise/bleed easily.  Skin: Negative for flushing, nail changes, rash and suspicious lesions.  Musculoskeletal: Negative for arthritis, joint pain, muscle cramps, myalgias, neck pain and stiffness.  Gastrointestinal:  Negative for abdominal pain, bowel incontinence, diarrhea and excessive appetite.  Genitourinary: Negative for decreased libido, genital sores and incomplete emptying.  Neurological: Negative for brief paralysis, focal weakness, headaches and loss of balance.  Psychiatric/Behavioral: Negative for altered mental status, depression and suicidal ideas.  Allergic/Immunologic: Negative for HIV exposure and persistent infections.    EKGs/Labs/Other Studies Reviewed:    The following studies were reviewed today:   EKG:  The ekg ordered today demonstrates   Zio Monitor The patient wore the monitor for 7 days, starting 06/22/2019.  Indication: Palpitations The minimum heart rate was 51 bpm, maximum heart rate was 175 bpm, and average heart rate 84 bpm.  The predominant underlying rhythm was Sinus Rhythm. Premature atrial complexes were rare (<1.0%). Premature ventricular complexes were rare (<1.0%). There were 7 patient triggered event: 2 was associated with ventricular ectopy, 5 was associated with sinus rhythm.  No ventricular tachycardia, No AV block, No pause, no supraventricular tachycardia and no atrial fibrillation was present.  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  Recent Labs: 11/05/2018: Hemoglobin 13.1; Platelets 335 01/18/2019: ALT 12 06/22/2019: BUN 10; Creatinine, Ser 0.89; Magnesium 2.1; Potassium 4.6; Sodium 141; TSH 0.775  Recent Lipid Panel    Component Value Date/Time   CHOL 196 05/31/2018 1023   TRIG 76 05/31/2018 1023   HDL 53 05/31/2018 1023   CHOLHDL 3.7 05/31/2018 1023   CHOLHDL 3 09/24/2014 0837   VLDL 9.6 09/24/2014 0837   LDLCALC 128 (H) 05/31/2018 1023    Physical Exam:    VS:  BP 100/70 (BP Location: Left Arm, Patient Position: Sitting, Cuff Size: Normal)   Pulse 93   Ht 5\' 9"  (1.753 m)   Wt 229 lb (103.9 kg)   SpO2 98%   BMI 33.82 kg/m     Wt Readings from Last 3 Encounters:  11/03/19 229 lb (103.9 kg)   08/07/19 221 lb (100.2 kg)  06/22/19 210 lb (95.3 kg)     GEN: Well nourished, well developed in no acute distress HEENT: Normal NECK: No JVD; No carotid bruits LYMPHATICS: No lymphadenopathy CARDIAC: S1S2 noted,RRR, no murmurs, rubs, gallops RESPIRATORY:  Clear to auscultation without rales, wheezing or rhonchi  ABDOMEN: Soft, non-tender, non-distended, +bowel sounds, no guarding. EXTREMITIES: No edema, No cyanosis, no clubbing MUSCULOSKELETAL:  No deformity  SKIN: Warm and dry NEUROLOGIC:  Alert and oriented x 3, non-focal PSYCHIATRIC:  Normal affect, good insight  ASSESSMENT:    1. Symptomatic PVCs   2. Atypical chest pain   3. Mixed hyperlipidemia  4. Obesity (BMI 30-39.9)    PLAN:     Even on the Lopressor as needed which was given his weight due to low blood pressure. She still is experiencing significant palpitations. Therefore at this time I have no choice but to start the patient on daily data blocker dose. I will start her on Toprol-XL 12.5 mg daily and will titrate up if symptoms and blood pressure can tolerate.  Her chest pain is atypical which could be in the setting of her PVCs. Calcium scoring done recently was zero. I also do believe her anxiety is contributing to this.  Hyperlipidemia-patient preferred diet modification.  Obesity-the patient understands the need to lose weight with diet and exercise. We have discussed specific strategies for this.  The patient is in agreement with the above plan. The patient left the office in stable condition.  The patient will follow up in 1 month or sooner if needed for follow-up medication.   Medication Adjustments/Labs and Tests Ordered: Current medicines are reviewed at length with the patient today.  Concerns regarding medicines are outlined above.  No orders of the defined types were placed in this encounter.  Meds ordered this encounter  Medications  . metoprolol succinate (TOPROL XL) 25 MG 24 hr tablet    Sig:  Take 0.5 tablets (12.5 mg total) by mouth daily.    Dispense:  45 tablet    Refill:  1    Patient Instructions  Medication Instructions:  Your physician has recommended you make the following change in your medication:   STOP : Metoprolol tartrate   START: Toprol XL(metoprolol succinate) 25 mg Take 1/2 tab(12.5 mg) daily  *If you need a refill on your cardiac medications before your next appointment, please call your pharmacy*   Lab Work: None If you have labs (blood work) drawn today and your tests are completely normal, you will receive your results only by: Marland Kitchen MyChart Message (if you have MyChart) OR . A paper copy in the mail If you have any lab test that is abnormal or we need to change your treatment, we will call you to review the results.   Testing/Procedures: NOne   Follow-Up: At [facility], you and your health needs are our priority.  As part of our continuing mission to provide you with exceptional heart care, we have created designated Provider Care Teams.  These Care Teams include your primary Cardiologist (physician) and Advanced Practice Providers (APPs -  Physician Assistants and Nurse Practitioners) who all work together to provide you with the care you need, when you need it.  We recommend signing up for the patient portal called "MyChart".  Sign up information is provided on this After Visit Summary.  MyChart is used to connect with patients for Virtual Visits (Telemedicine).  Patients are able to view lab/test results, encounter notes, upcoming appointments, etc.  Non-urgent messages can be sent to your provider as well.   To learn more about what you can do with MyChart, go to [email or web address].    Your next appointment:   1 month(s)  The format for your next appointment:   In Person  Provider:   Berniece Salines, DO   Other Instructions      Adopting a Healthy Lifestyle.  Know what a healthy weight is for you (roughly BMI <25) and aim to  maintain this   Aim for 7+ servings of fruits and vegetables daily   65-80+ fluid ounces of water or unsweet tea for healthy kidneys  Limit to max 1 drink of alcohol per day; avoid smoking/tobacco   Limit animal fats in diet for cholesterol and heart health - choose grass fed whenever available   Avoid highly processed foods, and foods high in saturated/trans fats   Aim for low stress - take time to unwind and care for your mental health   Aim for 150 min of moderate intensity exercise weekly for heart health, and weights twice weekly for bone health   Aim for 7-9 hours of sleep daily   When it comes to diets, agreement about the perfect plan isnt easy to find, even among the experts. Experts at the [employer] [place] developed an idea known as the Healthy Eating Plate. Just imagine a plate divided into logical, healthy portions.   The emphasis is on diet quality:   Load up on vegetables and fruits - one-half of your plate: Aim for color and variety, and remember that potatoes dont count.   Go for whole grains - one-quarter of your plate: Whole wheat, barley, wheat berries, quinoa, oats, brown rice, and foods made with them. If you want pasta, go with whole wheat pasta.   Protein power - one-quarter of your plate: Fish, chicken, beans, and nuts are all healthy, versatile protein sources. Limit red meat.   The diet, however, does go beyond the plate, offering a few other suggestions.   Use healthy plant oils, such as olive, canola, soy, corn, sunflower and peanut. Check the labels, and avoid partially hydrogenated oil, which have unhealthy trans fats.   If youre thirsty, drink water. Coffee and tea are good in moderation, but skip sugary drinks and limit milk and dairy products to one or two daily servings.   The type of carbohydrate in the diet is more important than the amount. Some sources of carbohydrates, such as vegetables, fruits, whole grains, and beans-are  healthier than others.   Finally, stay active  Signed, Berniece Salines, DO  11/03/2019 4:32 PM    [facility]

## 2019-11-10 ENCOUNTER — Ambulatory Visit (INDEPENDENT_AMBULATORY_CARE_PROVIDER_SITE_OTHER): Payer: 59 | Admitting: Physician Assistant

## 2019-11-10 ENCOUNTER — Encounter: Payer: Self-pay | Admitting: Physician Assistant

## 2019-11-10 DIAGNOSIS — F411 Generalized anxiety disorder: Secondary | ICD-10-CM | POA: Diagnosis not present

## 2019-11-10 DIAGNOSIS — F4321 Adjustment disorder with depressed mood: Secondary | ICD-10-CM | POA: Diagnosis not present

## 2019-11-10 NOTE — Progress Notes (Signed)
[facility]  Patient ID: Brandy [name],  MRN: [id number]  PCP: Lujean Amel, MD  Date of Evaluation: 11/10/2019 Time spent:20 minutes  Chief Complaint:  Chief Complaint    Anxiety     Virtual Visit via Telephone Note  I connected with patient by a video enabled telemedicine application or telephone, with their informed consent, and verified patient privacy and that I am speaking with the correct person using two identifiers.  I am private, in my office and the patient is work.  I discussed the limitations, risks, security and privacy concerns of performing an evaluation and management service by telephone and the availability of in person appointments. I also discussed with the patient that there may be a patient responsible charge related to this service. The patient expressed understanding and agreed to proceed.   I discussed the assessment and treatment plan with the patient. The patient was provided an opportunity to ask questions and all were answered. The patient agreed with the plan and demonstrated an understanding of the instructions.   The patient was advised to call back or seek an in-person evaluation if the symptoms worsen or if the condition fails to improve as anticipated.  I provided 20 minutes of non-face-to-face time during this encounter.  HISTORY/CURRENT STATUS: HPI For 6 month med check.  She restarted the Prozac for a few days b/c she realized that she needed something.  She is not really depressed but sometimes she does get overwhelmed, still worrying about what is wrong with her.  For several years now, since she had her baby, she has been certain that something is wrong with her physically that no one can find.  She has had multiple test that were all normal.  She is now seeing a cardiologist.  See review of systems.  The Klonopin still works.  She tries her best to only take 1/2 pill at bedtime to help her calm down to go to sleep.  It is rare  that she takes it during the daytime.  Around her menses, she usually has to take 1 every night because she feels more tense.  She still has panic attacks but they are not as often or severe.  They also do not last as long as they used to.  She is able to enjoy things.  Energy and motivation are good.  She does not cry easily.  Appetite is normal.  Weight is stable.  Denies suicidal or homicidal thoughts.  Denies increased energy with decreased need for sleep.  No impulsivity or risky behavior, no increased libido or increased spending.  Denies dizziness, syncope, seizures, numbness, tingling, tremor, tics, unsteady gait, slurred speech, confusion. Denies muscle or joint pain, stiffness, or dystonia.  Individual Medical History/ Review of Systems: Changes? :Yes  Has been put on Metoprolol by cardiology about a week ago for sinus tachycardia and palpitations.   Past medications for mental health diagnoses include: Prozac, Luvox, Elavil, Ativan, Xanax, Zoloft, Seroquel, Lithium for 1 pill, Buspar, Inderal, Cymbalta, NAC, Turmeric  Allergies: Metoclopramide, Penicillins, Erenumab-aooe, Fluvoxamine, Sumatriptan, and Zoloft [sertraline hcl]  Current Medications:  Current Outpatient Medications:  .  acetaminophen (TYLENOL) 500 MG tablet, Take 1,000 mg by mouth every 6 (six) hours as needed for headache., Disp: , Rfl:  .  Cholecalciferol (VITAMIN D) 2000 units CAPS, Take 2,000 Units by mouth daily., Disp: , Rfl:  .  clonazePAM (KLONOPIN) 0.5 MG tablet, Take 1 tablet (0.5 mg total) by mouth 2 (two) times daily as needed for  anxiety., Disp: 60 tablet, Rfl: 0 .  esomeprazole (NEXIUM) 40 MG capsule, Take 40 mg by mouth daily at 12 noon., Disp: , Rfl:  .  FLUoxetine (PROZAC) 10 MG tablet, Take 10 mg by mouth daily., Disp: , Rfl:  .  metoprolol succinate (TOPROL XL) 25 MG 24 hr tablet, Take 0.5 tablets (12.5 mg total) by mouth daily., Disp: 45 tablet, Rfl: 1 .  ondansetron (ZOFRAN ODT) 8 MG disintegrating  tablet, Take 1 tablet (8 mg total) by mouth every 8 (eight) hours as needed for nausea or vomiting., Disp: 60 tablet, Rfl: 0 .  ergocalciferol (VITAMIN D2) 1.25 MG (50000 UT) capsule, Vitamin D2 1,250 mcg (50,000 unit) capsule  Take 1 capsule every week by oral route as directed for 42 days., Disp: , Rfl:  .  famotidine (PEPCID) 40 MG tablet, Take 40 mg by mouth daily., Disp: , Rfl:  Medication Side Effects: none  Family Medical/ Social History: Changes? Yes works for [employer] since Nov.  Working w/ her husband.   MENTAL HEALTH EXAM:  not currently breastfeeding.There is no height or weight on file to calculate BMI.  General Appearance: Unable to assess  Eye Contact:  Unable to assess  Speech:  Clear and Coherent and Normal Rate  Volume:  Normal  Mood:  Euthymic  Affect:  Unable to assess  Thought Process:  Goal Directed and Descriptions of Associations: Circumstantial  Orientation:  Full (Time, Place, and Person)  Thought Content: Logical   Suicidal Thoughts:  No  Homicidal Thoughts:  No  Memory:  WNL  Judgement:  Good  Insight:  Good  Psychomotor Activity:  Normal  Concentration:  Concentration: Good  Recall:  Good  Fund of Knowledge: Good  Language: Good  Assets:  Desire for Improvement  ADL's:  Intact  Cognition: WNL  Prognosis:  Good    DIAGNOSES:    ICD-10-CM   1. Generalized anxiety disorder  F41.1   2. Situational depression  F43.21     Receiving Psychotherapy: No    RECOMMENDATIONS:  PDMP was reviewed. I spent 20 minutes with her. I am glad that she restarted the Prozac.  I do think it will be beneficial to help prevent or at least calm down the anxiety some.  She is willing to try it for a while. Continue Prozac 10 mg, 1 p.o. every morning. Continue Klonopin 0.5 mg, 1/2-1 p.o. twice daily as needed. Recommend counseling. Return in 6 weeks.  Donnal Moat, PA-C

## 2019-11-22 ENCOUNTER — Other Ambulatory Visit: Payer: Self-pay | Admitting: Physician Assistant

## 2019-11-22 DIAGNOSIS — K582 Mixed irritable bowel syndrome: Secondary | ICD-10-CM

## 2019-12-25 ENCOUNTER — Ambulatory Visit: Payer: 59 | Admitting: Cardiology

## 2019-12-27 IMAGING — DX DG CHEST 2V
2 series · 2 of 2 positions shown · non-contrast
Comparison: 01/23/2017

CLINICAL DATA: Chest pain, vomiting, shortness of breath, and
weakness.

EXAM:
CHEST - 2 VIEW

[chest pa]
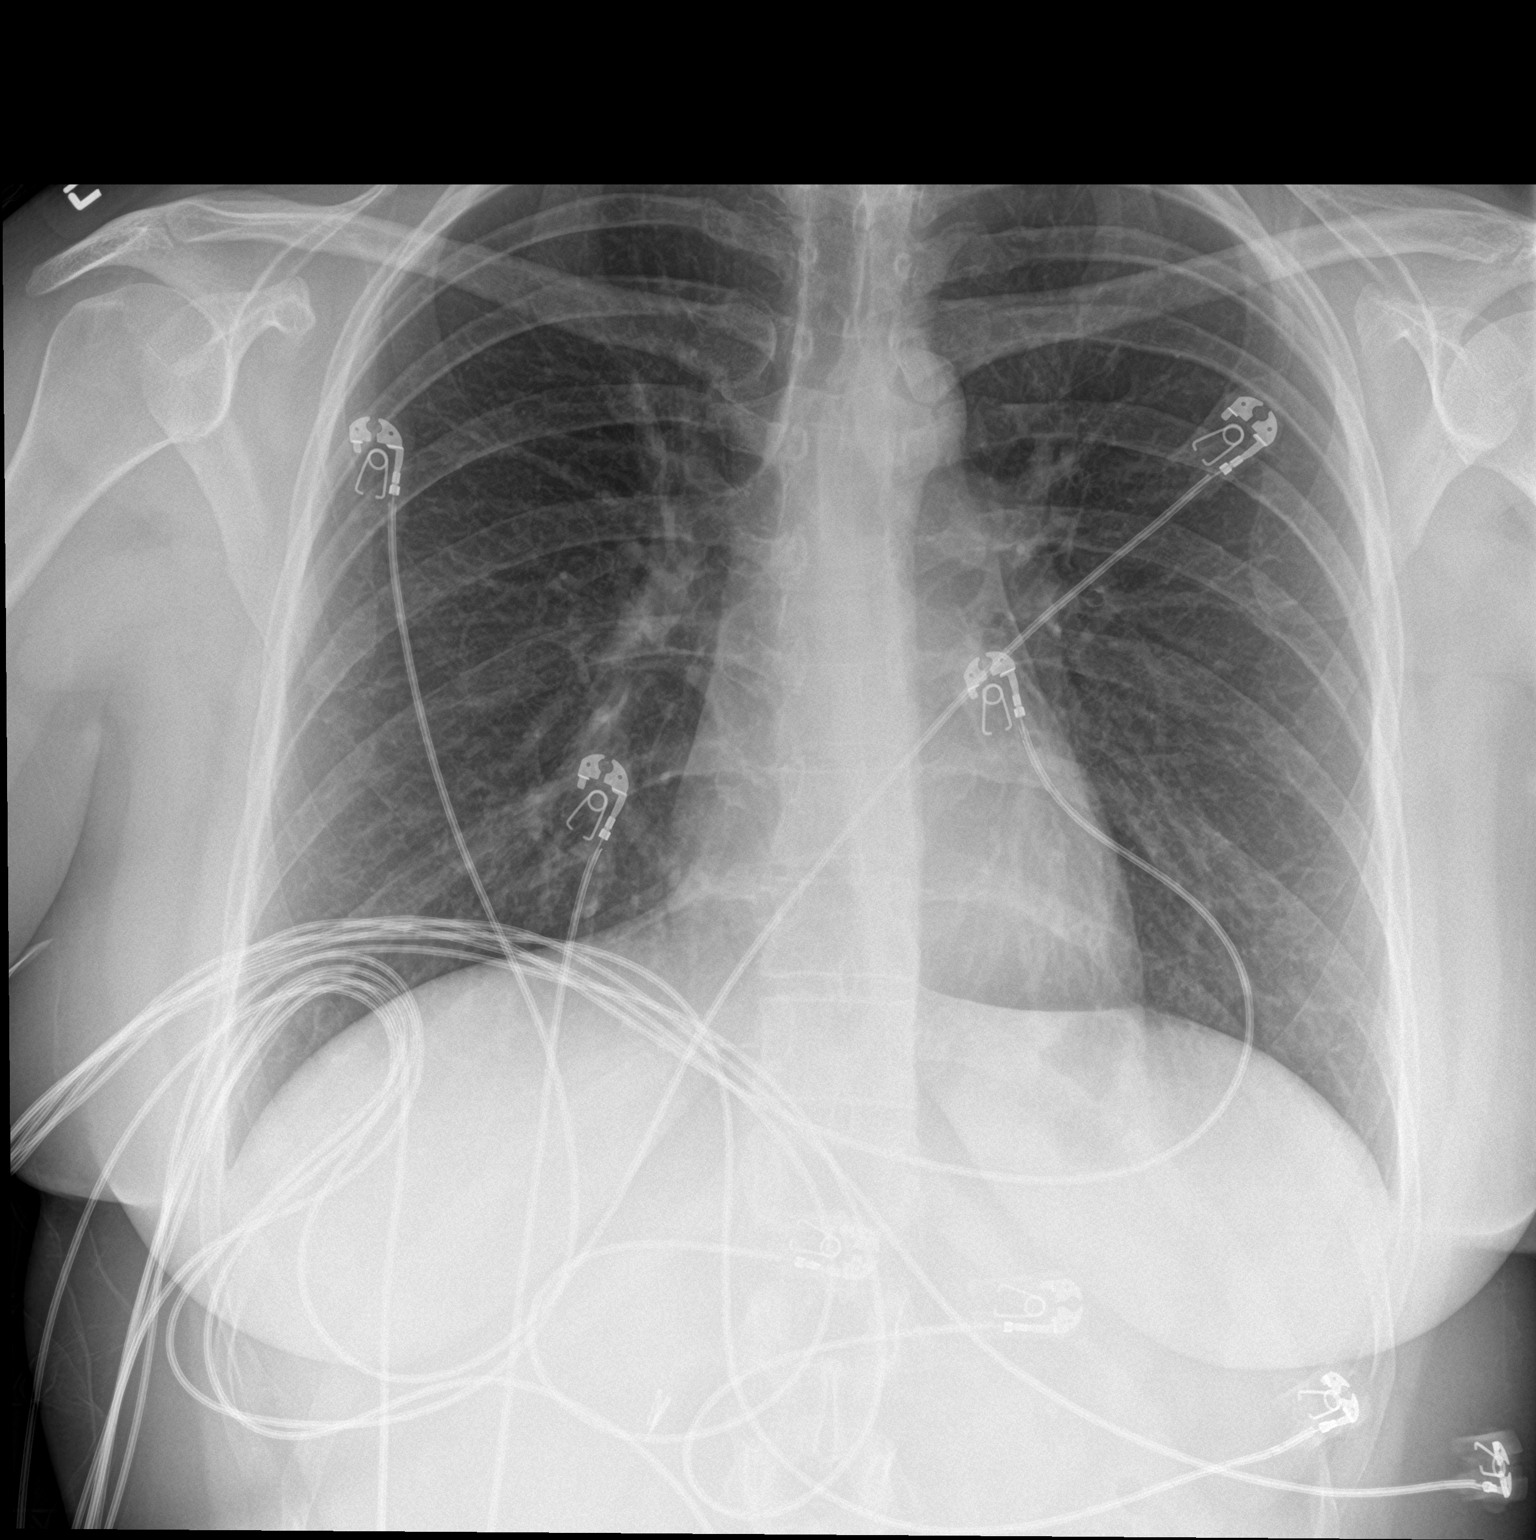

[chest lat]
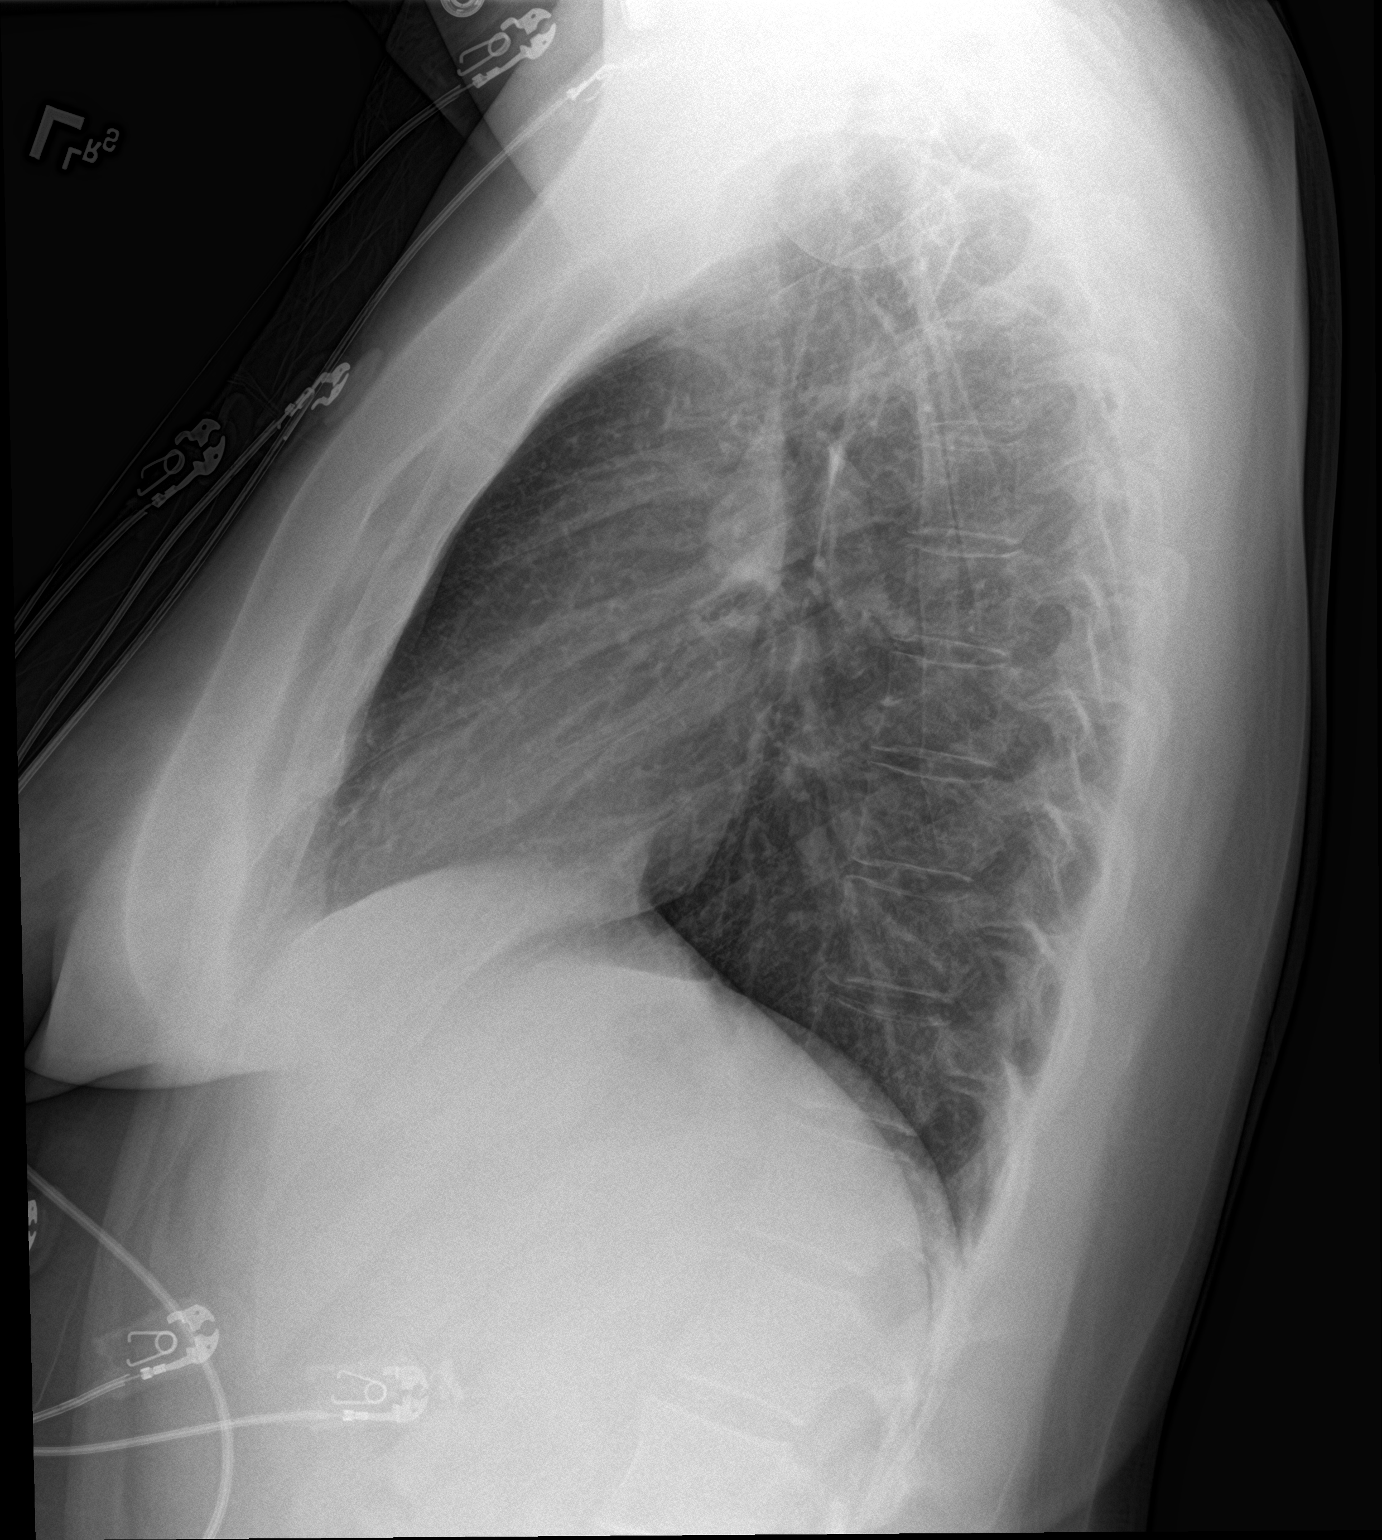

[2 of 2 positions shown; findings below may reference images not displayed]

FINDINGS: The heart size and mediastinal contours are within normal limits.
Both lungs are clear. The visualized skeletal structures are
unremarkable.
IMPRESSION: No active cardiopulmonary disease.

## 2020-01-30 ENCOUNTER — Other Ambulatory Visit: Payer: Self-pay

## 2020-01-30 ENCOUNTER — Telehealth: Payer: Self-pay | Admitting: Physician Assistant

## 2020-01-30 ENCOUNTER — Other Ambulatory Visit: Payer: Self-pay | Admitting: Physician Assistant

## 2020-01-30 NOTE — Telephone Encounter (Signed)
Last refill 10/25/2019 #60 Pended for Helene Kelp to submit Last apt 11/10/2019

## 2020-01-30 NOTE — Telephone Encounter (Signed)
Pt would like a refill on [facility] [place]

## 2020-02-05 ENCOUNTER — Telehealth: Payer: Self-pay | Admitting: Physician Assistant

## 2020-02-05 ENCOUNTER — Other Ambulatory Visit: Payer: Self-pay | Admitting: Physician Assistant

## 2020-02-05 MED ORDER — CLONAZEPAM 0.5 MG PO TABS: 0.5000 mg | ORAL_TABLET | Freq: Two times a day (BID) | ORAL | 0 refills | Status: DC | PRN

## 2020-02-05 NOTE — Telephone Encounter (Signed)
Pt called last week for Klonopin RF. She didn't have an appt at the time. It doesn't look like [facility] [place] [place]. She is completely out of it. Can we call it in? She made an appt for 6/29.

## 2020-02-05 NOTE — Telephone Encounter (Signed)
Prescription was sent in.

## 2020-02-14 ENCOUNTER — Telehealth: Payer: Self-pay | Admitting: Gastroenterology

## 2020-02-14 DIAGNOSIS — R112 Nausea with vomiting, unspecified: Secondary | ICD-10-CM

## 2020-02-14 MED ORDER — ONDANSETRON 8 MG PO TBDP: 8.0000 mg | ORAL_TABLET | Freq: Three times a day (TID) | ORAL | 0 refills | Status: DC | PRN

## 2020-02-14 NOTE — Telephone Encounter (Signed)
Patient requesting refill on Zofran

## 2020-02-14 NOTE — Telephone Encounter (Signed)
zofran refilled as requested.

## 2020-02-26 ENCOUNTER — Ambulatory Visit: Payer: 59 | Admitting: Internal Medicine

## 2020-02-27 ENCOUNTER — Ambulatory Visit: Payer: 59 | Admitting: Physician Assistant

## 2020-03-31 DIAGNOSIS — U071 COVID-19: Secondary | ICD-10-CM

## 2020-03-31 HISTORY — DX: COVID-19: U07.1

## 2020-04-08 ENCOUNTER — Encounter (HOSPITAL_COMMUNITY): Payer: Self-pay | Admitting: Emergency Medicine

## 2020-04-08 ENCOUNTER — Emergency Department (HOSPITAL_COMMUNITY): Payer: HRSA Program

## 2020-04-08 ENCOUNTER — Other Ambulatory Visit: Payer: Self-pay

## 2020-04-08 ENCOUNTER — Emergency Department (HOSPITAL_COMMUNITY)
Admission: EM | Admit: 2020-04-08 | Discharge: 2020-04-08 | Disposition: A | Discharge status: Home / Self Care | Payer: HRSA Program | Attending: Emergency Medicine | Admitting: Emergency Medicine

## 2020-04-08 DIAGNOSIS — R000 Tachycardia, unspecified: Secondary | ICD-10-CM | POA: Diagnosis not present

## 2020-04-08 DIAGNOSIS — R0682 Tachypnea, not elsewhere classified: Secondary | ICD-10-CM | POA: Diagnosis not present

## 2020-04-08 DIAGNOSIS — U071 COVID-19: Secondary | ICD-10-CM

## 2020-04-08 DIAGNOSIS — R531 Weakness: Secondary | ICD-10-CM | POA: Diagnosis not present

## 2020-04-08 DIAGNOSIS — R197 Diarrhea, unspecified: Secondary | ICD-10-CM | POA: Diagnosis not present

## 2020-04-08 DIAGNOSIS — Z79899 Other long term (current) drug therapy: Secondary | ICD-10-CM | POA: Diagnosis not present

## 2020-04-08 DIAGNOSIS — R112 Nausea with vomiting, unspecified: Secondary | ICD-10-CM | POA: Diagnosis not present

## 2020-04-08 DIAGNOSIS — R0602 Shortness of breath: Secondary | ICD-10-CM | POA: Diagnosis present

## 2020-04-08 LAB — CBC WITH DIFFERENTIAL/PLATELET
Abs Immature Granulocytes: 0.03 10*3/uL (ref 0.00–0.07)
Basophils Absolute: 0 10*3/uL (ref 0.0–0.1)
Basophils Relative: 0 %
Eosinophils Absolute: 0 10*3/uL (ref 0.0–0.5)
Eosinophils Relative: 0 %
HCT: 40.8 % (ref 36.0–46.0)
Hemoglobin: 12.9 g/dL (ref 12.0–15.0)
Immature Granulocytes: 0 %
Lymphocytes Relative: 10 %
Lymphs Abs: 0.7 10*3/uL (ref 0.7–4.0)
MCH: 28.6 pg (ref 26.0–34.0)
MCHC: 31.6 g/dL (ref 30.0–36.0)
MCV: 90.5 fL (ref 80.0–100.0)
Monocytes Absolute: 0.5 10*3/uL (ref 0.1–1.0)
Monocytes Relative: 7 %
Neutro Abs: 5.9 10*3/uL (ref 1.7–7.7)
Neutrophils Relative %: 83 %
Platelets: 293 10*3/uL (ref 150–400)
RBC: 4.51 MIL/uL (ref 3.87–5.11)
RDW: 13.5 % (ref 11.5–15.5)
WBC: 7.1 10*3/uL (ref 4.0–10.5)
nRBC: 0 % (ref 0.0–0.2)

## 2020-04-08 LAB — COMPREHENSIVE METABOLIC PANEL
ALT: 66 U/L — ABNORMAL HIGH (ref 0–44)
AST: 46 U/L — ABNORMAL HIGH (ref 15–41)
Albumin: 3.5 g/dL (ref 3.5–5.0)
Alkaline Phosphatase: 59 U/L (ref 38–126)
Anion gap: 13 (ref 5–15)
BUN: 8 mg/dL (ref 6–20)
CO2: 23 mmol/L (ref 22–32)
Calcium: 8.6 mg/dL — ABNORMAL LOW (ref 8.9–10.3)
Chloride: 106 mmol/L (ref 98–111)
Creatinine, Ser: 0.98 mg/dL (ref 0.44–1.00)
GFR calc Af Amer: >60 mL/min (ref >60)
GFR calc non Af Amer: >60 mL/min (ref >60)
Glucose, Bld: 114 mg/dL — ABNORMAL HIGH (ref 70–99)
Potassium: 3.6 mmol/L (ref 3.5–5.1)
Sodium: 142 mmol/L (ref 135–145)
Total Bilirubin: 0.7 mg/dL (ref 0.3–1.2)
Total Protein: 7.1 g/dL (ref 6.5–8.1)

## 2020-04-08 LAB — SARS CORONAVIRUS 2 BY RT PCR (HOSPITAL ORDER, PERFORMED IN ~~LOC~~ HOSPITAL LAB): SARS Coronavirus 2: POSITIVE — AB

## 2020-04-08 MED ORDER — SODIUM CHLORIDE 0.9 % IV BOLUS
1000.0000 mL | Freq: Once | INTRAVENOUS | Status: AC
  Administered 2020-04-08: 1000 mL via INTRAVENOUS

## 2020-04-08 MED ORDER — KETOROLAC TROMETHAMINE 30 MG/ML IJ SOLN
15.0000 mg | Freq: Once | INTRAMUSCULAR | Status: AC
  Administered 2020-04-08: 15 mg via INTRAVENOUS
  Filled 2020-04-08: qty 1

## 2020-04-08 MED ORDER — PROCHLORPERAZINE MALEATE 10 MG PO TABS: 10.0000 mg | ORAL_TABLET | Freq: Two times a day (BID) | ORAL | 0 refills | Status: DC | PRN

## 2020-04-08 NOTE — ED Notes (Signed)
Drank ginger ale with no problems

## 2020-04-08 NOTE — Discharge Instructions (Addendum)
As discussed, your evaluation today has been largely reassuring.  But, it is important that you monitor your condition carefully, and do not hesitate to return to the ED if you develop new, or concerning changes in your condition. ? ?Otherwise, please follow-up with your physician for appropriate ongoing care. ? ?

## 2020-04-08 NOTE — ED Triage Notes (Signed)
Pt c/o increase SOB, fever and cp for the past 2 weeks. Pt was diagnosed with Covid 19 on [facility].

## 2020-04-08 NOTE — ED Provider Notes (Signed)
[facility] EMERGENCY DEPARTMENT Provider Note   CSN: [id number] Arrival date & time: 04/08/20  1031     History Chief Complaint  Patient presents with  . Shortness of Breath    Brandy [name] is a 34 y.o. female.  HPI    Previously well adult female presents with concern for weakness, cough, nausea, vomiting, diarrhea. Patient has notable history of not receiving the coronavirus vaccine earlier in the year.  She notes that about 1 week ago she developed new symptoms, and 5 days ago and tested positive for novel coronavirus. She has multiple sick family members, and prior sick contacts. Since the onset of illness she has felt persistently poorly. No relief with OTC medication. 4 days ago she went to another medical facility, was seen, evaluated, discharged with outpatient follow-up. She notes that in spite of adding Phenergan to her outpatient regimen she continues to feel poorly. No focal chest pain, no persistent abdominal pain, no fever.  Additional details per EMR- OSH  Per OSH ED Impression: "Impression: Patient presented with shortness of breath in the setting of known Covid. Patient has already tested positive for Covid at another facility. She arrived here today complaining of a 1 day history of shortness of breath. She has other associated symptoms including fever, chills, cough, body aches, nausea, vomiting, diarrhea, headaches, runny nose, dizziness, weakness, and fatigue. Her CBC today shows leukopenia consistent with her known Covid. Troponin is negative. BMP shows no electrolyte imbalance, dehydration, or renal impairment. EKG shows normal sinus rhythm without acute arrhythmia or ischemia. Chest x-ray is clear. CTA chest shows no pulmonary embolism. She does have multifocal confluent and patchy airspace infiltrates in both lungs consistent with Covid pneumonia. Patient shortness of breath is related to Covid pneumonia. Fortunately, she has a normal oxygen  saturation. I performed an ambulatory oxygen saturation and it was 98% after she ambulated 200 feet. Patient has no indication for admission at this time. Plan is supportive care at home including rest, oral fluids, antipyretics, and antiemetics. Patient was given prescription for Zofran and rectal Phenergan for her nausea. She is to follow-up with primary care or return here for worsening."   Past Medical History:  Diagnosis Date  . Abdominal pain, epigastric 02/18/2017  . Anterior neck pain 12/30/2015  . Anxiety   . Anxiety associated with birthing process 06/14/2018  . B12 deficiency 07/21/2016  . Chronic mixed headache syndrome 06/14/2018  . Depression   . DUB (dysfunctional uterine bleeding) 06/04/2011  . Dysphagia 07/20/2018  . Enlarged thyroid gland 12/30/2015  . Fatigue 12/30/2015  . Gallstones   . Generalized anxiety disorder 06/09/2016  . GERD (gastroesophageal reflux disease) 02/19/2011  . Headache, unspecified headache type 03/05/2018  . History of nephrolithiasis   . Kidney stone   . Medication overuse headache 06/14/2018  . Menorrhagia 11/09/2018  . Migraine   . Missed abortion 11/09/2018  . Nausea vomiting and diarrhea 12/28/2013  . Nausea without vomiting 02/18/2017  . NEPHROLITHIASIS, HX OF 12/16/2007   Qualifier: Diagnosis of  By: Sherren Mocha MD, Jory Ee   . Palpitations 03/21/2018  . Pituitary tumor   . 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 Precordial pain 03/21/2018  . Routine general medical examination at a health care facility 09/27/2014  . Serum calcium elevated 12/28/2013  . SVD (spontaneous vaginal delivery) 02/02/2018  . Tendinitis of right ankle 08/14/2014  . 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 Vitamin D deficiency     Patient Active Problem List   Diagnosis Date  Noted  . Menorrhagia 11/09/2018  . Missed abortion 11/09/2018  . Dysphagia 07/20/2018  . Anxiety associated with birthing process 06/14/2018  . 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 Chronic mixed headache  syndrome 06/14/2018  . Medication overuse headache 06/14/2018  . Palpitations 03/21/2018  . Precordial pain 03/21/2018  . Headache, unspecified headache type 03/05/2018  . SVD (spontaneous vaginal delivery) 02/02/2018  . Nausea without vomiting 02/18/2017  . Abdominal pain, epigastric 02/18/2017  . Vitamin D deficiency 07/21/2016  . B12 deficiency 07/21/2016  . Generalized anxiety disorder 06/09/2016  . Anterior neck pain 12/30/2015  . Enlarged thyroid gland 12/30/2015  . Fatigue 12/30/2015  . Routine general medical examination at a health care facility 09/27/2014  . Tendinitis of right ankle 08/14/2014  . Nausea vomiting and diarrhea 12/28/2013  . Serum calcium elevated 12/28/2013  . 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 DUB (dysfunctional uterine bleeding) 06/04/2011  . GERD (gastroesophageal reflux disease) 02/19/2011  . Depression 09/06/2008  . NEPHROLITHIASIS, HX OF 12/16/2007    Past Surgical History:  Procedure Laterality Date  . CHOLECYSTECTOMY  2014  . KIDNEY STONE SURGERY     x 5  . MOUTH SURGERY    . TYMPANOSTOMY TUBE PLACEMENT       OB History    Gravida  3   Para  1   Term  1   Preterm  0   AB  1   Living  1     SAB  1   TAB  0   Ectopic  0   Multiple  0   Live Births  1        Obstetric Comments  Age first period 64        Family History  Problem Relation Age of Onset  . Diabetes Mother   . Mental illness Mother   . Asthma Mother   . Stomach cancer Mother   . Colon polyps Mother   . Heart disease Mother   . Hypertension Mother   . Arthritis Mother   . Diabetes Father   . Hypertension Father   . Anxiety disorder Sister   . Colon cancer Maternal Grandmother   . Diabetes Paternal Grandmother   . Breast cancer Maternal Aunt   . Lung cancer Maternal Aunt   . Diabetes Paternal Aunt   . Diabetes Maternal Uncle   . Diabetes Maternal Uncle     Social History   Tobacco Use  . Smoking status: Never Smoker  . Smokeless tobacco: Never  Used  Vaping Use  . Vaping Use: Never used  Substance Use Topics  . Alcohol use: Not Currently  . Drug use: No    Home Medications Prior to Admission medications   Medication Sig Start Date End Date Taking? Authorizing Provider  acetaminophen (TYLENOL) 500 MG tablet Take 1,000 mg by mouth every 6 (six) hours as needed for mild pain or headache.    Yes [provider]  Cholecalciferol (VITAMIN D) 2000 units CAPS Take 2,000 Units by mouth daily.   Yes [provider]  clonazePAM (KLONOPIN) 0.5 MG tablet Take 1 tablet (0.5 mg total) by mouth 2 (two) times daily as needed for anxiety. 02/05/20  Yes Hurst, Dorothea Glassman, PA-C  esomeprazole (NEXIUM) 40 MG capsule Take 40 mg by mouth daily at 12 noon.   Yes [provider]  guaiFENesin (MUCINEX) 600 MG 12 hr tablet Take by mouth 2 (two) times daily as needed for cough.   Yes [provider]  ondansetron (  ZOFRAN-ODT) 4 MG disintegrating tablet Take 4 mg by mouth every 8 (eight) hours as needed for nausea/vomiting. 04/05/20  Yes [provider]  Probiotic Product (PROBIOTIC PO) Take 1 capsule by mouth daily.   Yes [provider]  promethazine (PHENERGAN) 25 MG suppository Place 25 mg rectally every 6 (six) hours as needed for nausea. 04/05/20  Yes [provider]  metoprolol succinate (TOPROL XL) 25 MG 24 hr tablet Take 0.5 tablets (12.5 mg total) by mouth daily. Patient not taking: Reported on 04/08/2020 11/03/19   Tobb, Kardie, DO  ondansetron (ZOFRAN ODT) 8 MG disintegrating tablet Take 1 tablet (8 mg total) by mouth every 8 (eight) hours as needed for nausea or vomiting. Patient not taking: Reported on 04/08/2020 02/14/20   Gatha Mayer, MD  DULoxetine (CYMBALTA) 20 MG capsule Take 1 capsule (20 mg total) by mouth daily. Patient not taking: Reported on 03/20/2019 01/11/19 06/21/19  Donnal Moat T, PA-C    Allergies    Metoclopramide, Penicillins, Erenumab-aooe, Fluvoxamine, Sumatriptan, and  Zoloft [sertraline hcl]  Review of Systems   Review of Systems  Constitutional:       Per HPI, otherwise negative  HENT:       Per HPI, otherwise negative  Respiratory:       Per HPI, otherwise negative  Cardiovascular:       Per HPI, otherwise negative  Gastrointestinal: Positive for diarrhea, nausea and vomiting.  Endocrine:       Negative aside from HPI  Genitourinary:       Neg aside from HPI   Musculoskeletal:       Per HPI, otherwise negative  Skin: Negative.   Allergic/Immunologic: Negative for immunocompromised state.  Neurological: Positive for weakness. Negative for syncope.    Physical Exam Updated Vital Signs BP 111/76   Pulse 93   Temp 98.4 F (36.9 C) (Oral)   Resp (!) 24   Ht 5\' 9"  (1.753 m)   Wt 103.9 kg   SpO2 95%   BMI 33.83 kg/m   Physical Exam Vitals and nursing note reviewed.  Constitutional:      General: She is not in acute distress.    Appearance: She is well-developed.  HENT:     Head: Normocephalic and atraumatic.  Eyes:     Conjunctiva/sclera: Conjunctivae normal.  Cardiovascular:     Rate and Rhythm: Regular rhythm. Tachycardia present.  Pulmonary:     Effort: Pulmonary effort is normal. Tachypnea present.     Breath sounds: Decreased breath sounds present. No wheezing.  Abdominal:     General: There is no distension.  Skin:    General: Skin is warm and dry.  Neurological:     Mental Status: She is alert and oriented to person, place, and time.     Cranial Nerves: No cranial nerve deficit.     ED Results / Procedures / Treatments   Labs (all labs ordered are listed, but only abnormal results are displayed) Labs Reviewed  COMPREHENSIVE METABOLIC PANEL - Abnormal; Notable for the following components:      Result Value   Glucose, Bld 114 (*)    Calcium 8.6 (*)    AST 46 (*)    ALT 66 (*)    All other components within normal limits  SARS CORONAVIRUS 2 BY RT PCR [facility] ORDER, [place] [facility])    CBC WITH DIFFERENTIAL/PLATELET    EKG EKG Interpretation  Date/Time:  Monday April 08 2020 11:11:14 EDT Ventricular Rate:  100 PR Interval:  122 QRS Duration: 70 QT Interval:  326 QTC Calculation: 420 R Axis:   48 Text Interpretation: Normal sinus rhythm Nonspecific T wave abnormality Abnormal ECG Confirmed by Carmin Muskrat [phone]) on 04/08/2020 1:00:34 PM   Radiology DG Chest Port 1 View  Result Date: 04/08/2020 CLINICAL DATA:  Shortness of breath with fever and chest pain EXAM: PORTABLE CHEST 1 VIEW COMPARISON:  April 04, 2020 chest radiograph and chest CT FINDINGS: Patchy areas of airspace opacity are noted, primarily in the lower lung regions. Lungs elsewhere are clear. Heart size and pulmonary vascularity are normal. No adenopathy. No bone lesions. IMPRESSION: Multifocal airspace opacity, primarily in the lower lung regions, also present on recent CT examination. These opacities are somewhat better seen currently than on most recent radiographic examination. Electronically Signed   By: Lowella Grip III M.D.   On: 04/08/2020 13:11    Procedures Procedures (including critical care time)  Medications Ordered in ED Medications  sodium chloride 0.9 % bolus 1,000 mL (has no administration in time range)  sodium chloride 0.9 % bolus 1,000 mL (1,000 mLs Intravenous New Bag/Given 04/08/20 1405)  ketorolac (TORADOL) 30 MG/ML injection 15 mg (15 mg Intravenous Given 04/08/20 1406)    ED Course  I have reviewed the triage vital signs and the nursing notes.  Pertinent labs & imaging results that were available during my care of the patient were reviewed by me and considered in my medical decision making (see chart for details).  :, After initial fluids patient's heart rate is improved somewhat. 3:01 PM Patient's heart rate now 95, she is awake, alert, sitting upright. We had a lengthy conversation about today's x-ray, her recent evaluation, CT, x-ray results, likelihood of ongoing  slow recovery from coronavirus. Here she is not hypoxic, requiring no supplemental oxygen.  Vital signs have improved substantially, following fluid resuscitation. Though she does have evidence for ongoing Covid infection, is no evidence for bacteremia, sepsis, no evidence for decompensation given her duration of illness for almost 1 week. Patient appropriate for discharge with close outpatient follow-up. I have discussed electronically the patient's case with our antibody infusion clinic and the patient will be contacted as an outpatient for appropriate ongoing outpatient therapy.  Brandy [name] was evaluated in Emergency Department on 04/08/2020 for the symptoms described in the history of present illness. She was evaluated in the context of the global COVID-19 pandemic, which necessitated consideration that the patient might be at risk for infection with the SARS-CoV-2 virus that causes COVID-19. Institutional protocols and algorithms that pertain to the evaluation of patients at risk for COVID-19 are in a state of rapid change based on information released by regulatory bodies including the CDC and federal and state organizations. These policies and algorithms were followed during the patient's care in the ED.  Final Clinical Impression(s) / ED Diagnoses Final diagnoses:  COVID-19 virus infection    Rx / DC Orders ED Discharge Orders         Ordered    prochlorperazine (COMPAZINE) 10 MG tablet  2 times daily PRN     Discontinue  Reprint     04/08/20 1507           Carmin Muskrat, MD 04/08/20 [phone]

## 2020-04-09 ENCOUNTER — Other Ambulatory Visit (HOSPITAL_COMMUNITY): Payer: Self-pay | Admitting: Nurse Practitioner

## 2020-04-09 DIAGNOSIS — U071 COVID-19: Secondary | ICD-10-CM

## 2020-04-09 NOTE — Progress Notes (Signed)
I connected by phone with Brandy [name] on 04/09/2020 at 11:14 AM to discuss the potential use of an new treatment for mild to moderate COVID-19 viral infection in non-hospitalized patients.  This patient is a 34 y.o. female that meets the FDA criteria for Emergency Use Authorization of casirivimab\imdevimab.  Has a (+) direct SARS-CoV-2 viral test result  Has mild or moderate COVID-19   Is ? 33 years of age and weighs ? 40 kg  Is NOT hospitalized due to COVID-19  Is NOT requiring oxygen therapy or requiring an increase in baseline oxygen flow rate due to COVID-19  Is within 10 days of symptom onset  Has at least one of the high risk factor(s) for progression to severe COVID-19 and/or hospitalization as defined in EUA.  Specific high risk criteria : BMI > 25  Sx onset 8/4.   I have spoken and communicated the following to the patient or parent/caregiver:  1. FDA has authorized the emergency use of casirivimab\imdevimab for the treatment of mild to moderate COVID-19 in adults and pediatric patients with positive results of direct SARS-CoV-2 viral testing who are 33 years of age and older weighing at least 40 kg, and who are at high risk for progressing to severe COVID-19 and/or hospitalization.  2. The significant known and potential risks and benefits of casirivimab\imdevimab, and the extent to which such potential risks and benefits are unknown.  3. Information on available alternative treatments and the risks and benefits of those alternatives, including clinical trials.  4. Patients treated with casirivimab\imdevimab should continue to self-isolate and use infection control measures (e.g., wear mask, isolate, social distance, avoid sharing personal items, clean and disinfect "high touch" surfaces, and frequent handwashing) according to CDC guidelines.   5. The patient or parent/caregiver has the option to accept or refuse casirivimab\imdevimab .  After reviewing this information  with the patient, The patient agreed to proceed with receiving casirivimab\imdevimab infusion and will be provided a copy of the Fact sheet prior to receiving the infusion.Brandy [name], [place], AGNP-C [phone] ([place])

## 2020-04-10 ENCOUNTER — Ambulatory Visit (HOSPITAL_COMMUNITY)
Admission: RE | Admit: 2020-04-10 | Discharge: 2020-04-10 | Disposition: A | Discharge status: Home / Self Care | Payer: HRSA Program | Source: Ambulatory Visit | Attending: Pulmonary Disease | Admitting: Pulmonary Disease

## 2020-04-10 ENCOUNTER — Other Ambulatory Visit (HOSPITAL_COMMUNITY): Payer: Self-pay | Admitting: Nurse Practitioner

## 2020-04-10 DIAGNOSIS — U071 COVID-19: Secondary | ICD-10-CM | POA: Insufficient documentation

## 2020-04-10 MED ORDER — FAMOTIDINE IN NACL 20-0.9 MG/50ML-% IV SOLN: 20.0000 mg | Freq: Once | INTRAVENOUS | Status: DC | PRN

## 2020-04-10 MED ORDER — DIPHENHYDRAMINE HCL 50 MG/ML IJ SOLN: 50.0000 mg | Freq: Once | INTRAMUSCULAR | Status: DC | PRN

## 2020-04-10 MED ORDER — SODIUM CHLORIDE 0.9 % IV SOLN: INTRAVENOUS | Status: DC | PRN

## 2020-04-10 MED ORDER — SODIUM CHLORIDE 0.9 % IV SOLN
1200.0000 mg | Freq: Once | INTRAVENOUS | Status: AC
  Administered 2020-04-10: 1200 mg via INTRAVENOUS
  Filled 2020-04-10: qty 1200

## 2020-04-10 MED ORDER — ALBUTEROL SULFATE HFA 108 (90 BASE) MCG/ACT IN AERS: 2.0000 | INHALATION_SPRAY | Freq: Once | RESPIRATORY_TRACT | Status: DC | PRN

## 2020-04-10 MED ORDER — METHYLPREDNISOLONE SODIUM SUCC 125 MG IJ SOLR: 125.0000 mg | Freq: Once | INTRAMUSCULAR | Status: DC | PRN

## 2020-04-10 MED ORDER — EPINEPHRINE 0.3 MG/0.3ML IJ SOAJ: 0.3000 mg | Freq: Once | INTRAMUSCULAR | Status: DC | PRN

## 2020-04-10 NOTE — Progress Notes (Signed)
  Diagnosis: COVID-19  Physician:Dr Wright  Procedure: Covid Infusion Clinic Med: casirivimab\imdevimab infusion - Provided patient with casirivimab\imdevimab fact sheet for patients, parents and caregivers prior to infusion.  Complications: No immediate complications noted.  Discharge: Discharged home   Brandy [name] 04/10/2020  

## 2020-04-10 NOTE — Discharge Instructions (Signed)

## 2020-04-23 ENCOUNTER — Telehealth: Payer: Self-pay | Admitting: Physician Assistant

## 2020-04-23 NOTE — Telephone Encounter (Signed)
Pt has an appt on 9/29 and needs [facility] [place] [place] Phone # [phone].

## 2020-04-24 ENCOUNTER — Other Ambulatory Visit: Payer: Self-pay

## 2020-04-24 MED ORDER — CLONAZEPAM 0.5 MG PO TABS: 0.5000 mg | ORAL_TABLET | Freq: Two times a day (BID) | ORAL | 0 refills | Status: DC | PRN

## 2020-04-24 NOTE — Telephone Encounter (Signed)
Last refill 02/05/2020 #60 Has apt 05/29/2020 Pended for Helene Kelp to send

## 2020-04-30 ENCOUNTER — Other Ambulatory Visit: Payer: Self-pay

## 2020-04-30 ENCOUNTER — Ambulatory Visit (INDEPENDENT_AMBULATORY_CARE_PROVIDER_SITE_OTHER): Payer: 59 | Admitting: Cardiology

## 2020-04-30 ENCOUNTER — Encounter: Payer: Self-pay | Admitting: Cardiology

## 2020-04-30 VITALS — BP 114/68 | HR 104 | Ht 69.0 in | Wt 240.6 lb

## 2020-04-30 DIAGNOSIS — U071 COVID-19: Secondary | ICD-10-CM | POA: Diagnosis not present

## 2020-04-30 DIAGNOSIS — J1282 Pneumonia due to coronavirus disease 2019: Secondary | ICD-10-CM

## 2020-04-30 DIAGNOSIS — I493 Ventricular premature depolarization: Secondary | ICD-10-CM | POA: Diagnosis not present

## 2020-04-30 DIAGNOSIS — E669 Obesity, unspecified: Secondary | ICD-10-CM | POA: Insufficient documentation

## 2020-04-30 DIAGNOSIS — R0602 Shortness of breath: Secondary | ICD-10-CM | POA: Insufficient documentation

## 2020-04-30 MED ORDER — METOPROLOL SUCCINATE ER 25 MG PO TB24: 12.5000 mg | ORAL_TABLET | Freq: Two times a day (BID) | ORAL | 1 refills | Status: DC

## 2020-04-30 NOTE — Patient Instructions (Signed)
Medication Instructions:    START TAKING TOPROL XL 12.5 MG TWICE A DAY   *If you need a refill on your cardiac medications before your next appointment, please call your pharmacy*   Lab Work: NONE ORDERED  TODAY   If you have labs (blood work) drawn today and your tests are completely normal, you will receive your results only by: Marland Kitchen MyChart Message (if you have MyChart) OR . A paper copy in the mail If you have any lab test that is abnormal or we need to change your treatment, we will call you to review the results.   Testing/Procedures: NONE ORDERED  TODAY   Follow-Up: At [facility], you and your health needs are our priority.  As part of our continuing mission to provide you with exceptional heart care, we have created designated Provider Care Teams.  These Care Teams include your primary Cardiologist (physician) and Advanced Practice Providers (APPs -  Physician Assistants and Nurse Practitioners) who all work together to provide you with the care you need, when you need it.  We recommend signing up for the patient portal called "MyChart".  Sign up information is provided on this After Visit Summary.  MyChart is used to connect with patients for Virtual Visits (Telemedicine).  Patients are able to view lab/test results, encounter notes, upcoming appointments, etc.  Non-urgent messages can be sent to your provider as well.   To learn more about what you can do with MyChart, go to [email or web address].    Your next appointment:   3 month(s)  The format for your next appointment:   In Person  Provider:   You will see Berniece Salines, DO.  Or, you can be scheduled with the following Advanced Practice Provider on your designated Care Team (at our [facility]):  Laurann Montana, FNP     Other Instructions  [place]

## 2020-04-30 NOTE — Progress Notes (Signed)
Cardiology Office Note:    Date:  04/30/2020   ID:  Brandy [name], DOB 1986-07-02, MRN [id number]  PCP:  Lujean Amel, MD  Cardiologist:  Berniece Salines, DO  Electrophysiologist:  None   Referring MD: Lujean Amel, MD   Follow-up " I had Covid my shortness of breath has gotten worse"  History of Present Illness:    Brandy Hubbardis a 34 y.o.femalewith a hx of anxiety and postpartum depression. The patient was initially seen in consultation on May 31, 2018 for complaints of palpitation and shortness of breath. During her initial consultationit was noted that the patient [facility] [place] [place] [facility] had had previous work-up with a normal echocardiogram (July 2019) and a treadmill SPECT stress test (which was also July 2019) which I was able to review in Care Everywhere.Her Holter August 29 showed sinus tachycardia. The conclusion at that visit a 48-hour Holter monitor was placed on patient she was encouraged to continue hydration as well as continue treatment for her anxiety. Review of the Holter monitors did not show predominant sinus arrhythmia with 1 PAC. There are no ventricular ectopy or no pauses. Her average heart rate was 82 bpm.  She did continue to have palpitations therefore a longer monitor was placed on patient for 6 days which showed dominant rhythm of sinus rhythm. Isolated atrial ventricular ectopy. 12 of her triggered events were associated with sinus rhythm, one was normal sinus rhythm with PVC, one was sinus tachycardia with PVC and one was sinus rhythm with PAC.  Her most recent encounter was Jan 03, 2019 and this was a telemetry medicine visit. At that timeit was discussedwith the patient to pursue noncardiac work-up.  I did see the patient on the June 22, 2019 at which time she was experiencing worsening palpitations with associated dizziness. I placed a monitor on the patient at that time and I did see her in December 2020 and  discussed the result of her monitor showing symptomatic PVCs. Due to low blood pressure as needed Lopressor 12.5 was given to the patient.  I saw the patient on November 03, 2019 at that time I started her on Toprol-XL 12.5 mg daily she reported that she was having worsening palpitations.  Since her last visit she did get COVID-19 infection.  And became hypoxic and was taken to the emergency room at [facility].  At that time she did get a CT scan we rule out PE.  Evidence of COVID-19 pneumonia.   She has since recovered and tells me since her recovery she has had worsening   Past Medical History:  Diagnosis Date  . Abdominal pain, epigastric 02/18/2017  . Anterior neck pain 12/30/2015  . Anxiety   . Anxiety associated with birthing process 06/14/2018  . B12 deficiency 07/21/2016  . Chronic mixed headache syndrome 06/14/2018  . Depression   . DUB (dysfunctional uterine bleeding) 06/04/2011  . Dysphagia 07/20/2018  . Enlarged thyroid gland 12/30/2015  . Fatigue 12/30/2015  . Gallstones   . Generalized anxiety disorder 06/09/2016  . GERD (gastroesophageal reflux disease) 02/19/2011  . Headache, unspecified headache type 03/05/2018  . History of nephrolithiasis   . Kidney stone   . Medication overuse headache 06/14/2018  . Menorrhagia 11/09/2018  . Migraine   . Missed abortion 11/09/2018  . Nausea vomiting and diarrhea 12/28/2013  . Nausea without vomiting 02/18/2017  . NEPHROLITHIASIS, HX OF 12/16/2007   Qualifier: Diagnosis of  By: Sherren Mocha MD, Jory Ee   . Palpitations  03/21/2018  . Pituitary tumor   . 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 Precordial pain 03/21/2018  . Routine general medical examination at a health care facility 09/27/2014  . Serum calcium elevated 12/28/2013  . SVD (spontaneous vaginal delivery) 02/02/2018  . Tendinitis of right ankle 08/14/2014  . 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 Vitamin D deficiency     Past Surgical History:  Procedure Laterality Date  .  CHOLECYSTECTOMY  2014  . KIDNEY STONE SURGERY     x 5  . MOUTH SURGERY    . TYMPANOSTOMY TUBE PLACEMENT      Current Medications: Current Meds  Medication Sig  . acetaminophen (TYLENOL) 500 MG tablet Take 1,000 mg by mouth every 6 (six) hours as needed for mild pain or headache.   . Cholecalciferol (VITAMIN D) 2000 units CAPS Take 2,000 Units by mouth daily.  . clonazePAM (KLONOPIN) 0.5 MG tablet Take 1 tablet (0.5 mg total) by mouth 2 (two) times daily as needed for anxiety.  Marland Kitchen esomeprazole (NEXIUM) 40 MG capsule Take 40 mg by mouth daily at 12 noon.  Marland Kitchen guaiFENesin (MUCINEX) 600 MG 12 hr tablet Take by mouth 2 (two) times daily as needed for cough.  . metoprolol succinate (TOPROL XL) 25 MG 24 hr tablet Take 0.5 tablets (12.5 mg total) by mouth daily.  . ondansetron (ZOFRAN ODT) 8 MG disintegrating tablet Take 1 tablet (8 mg total) by mouth every 8 (eight) hours as needed for nausea or vomiting.  . Probiotic Product (PROBIOTIC PO) Take 1 capsule by mouth daily.     Allergies:   Metoclopramide, Penicillins, Erenumab-aooe, Fluvoxamine, Sumatriptan, and Zoloft [sertraline hcl]   Social History   Socioeconomic History  . Marital status: Married    Spouse name: Jonathon  . Number of children: 1  . Years of education: Not on file  . Highest education level: Bachelor's degree (e.g., BA, AB, BS)  Occupational History  . Occupation: Marine scientist: [employer]  Tobacco Use  . Smoking status: Never Smoker  . Smokeless tobacco: Never Used  Vaping Use  . Vaping Use: Never used  Substance and Sexual Activity  . Alcohol use: Not Currently  . Drug use: No  . Sexual activity: Yes  Other Topics Concern  . Not on file  Social History Narrative   Patient is right-handed. She lives with her husband and child in a split-level home.    Daughter born 01/2018   She avoids caffeine.    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[employer]      Patient is now working at [employer] as  [employer] 01/17/19   Social Determinants of Health   Financial Resource Strain:   . Difficulty of Paying Living Expenses: Not on file  Food Insecurity:   . Worried About Charity fundraiser in the Last Year: Not on file  . Ran Out of Food in the Last Year: Not on file  Transportation Needs:   . Lack of Transportation (Medical): Not on file  . Lack of Transportation (Non-Medical): Not on file  Physical Activity:   . Days of Exercise per Week: Not on file  . Minutes of Exercise per Session: Not on file  Stress:   . Feeling of Stress : Not on file  Social Connections:   . Frequency of Communication with Friends and Family: Not on file  . Frequency of Social Gatherings with Friends and Family: Not on file  . Attends Religious Services:  Not on file  . Active Member of Clubs or Organizations: Not on file  . Attends Archivist Meetings: Not on file  . Marital Status: Not on file     Family History: The patient's family history includes Anxiety disorder in her sister; Arthritis in her mother; Asthma in her mother; Breast cancer in her maternal aunt; Colon cancer in her maternal grandmother; Colon polyps in her mother; Diabetes in her father, maternal uncle, maternal uncle, mother, paternal aunt, and paternal grandmother; Heart disease in her mother; Hypertension in her father and mother; Lung cancer in her maternal aunt; Mental illness in her mother; Stomach cancer in her mother.  ROS:   Review of Systems  Constitution: Negative for decreased appetite, fever and weight gain.  HENT: Negative for congestion, ear discharge, hoarse voice and sore throat.   Eyes: Negative for discharge, redness, vision loss in right eye and visual halos.  Cardiovascular: Negative for chest pain, dyspnea on exertion, leg swelling, orthopnea and palpitations.  Respiratory: Reports shortness of breath.  Negative for cough, hemoptysis,and snoring.   Endocrine: Negative for heat intolerance and  polyphagia.  Hematologic/Lymphatic: Negative for bleeding problem. Does not bruise/bleed easily.  Skin: Negative for flushing, nail changes, rash and suspicious lesions.  Musculoskeletal: Negative for arthritis, joint pain, muscle cramps, myalgias, neck pain and stiffness.  Gastrointestinal: Negative for abdominal pain, bowel incontinence, diarrhea and excessive appetite.  Genitourinary: Negative for decreased libido, genital sores and incomplete emptying.  Neurological: Negative for brief paralysis, focal weakness, headaches and loss of balance.  Psychiatric/Behavioral: Negative for altered mental status, depression and suicidal ideas.  Allergic/Immunologic: Negative for HIV exposure and persistent infections.    EKGs/Labs/Other Studies Reviewed:    The following studies were reviewed today:   EKG: None today  Echocardiogram IMPRESSIONS  1. Left ventricular ejection fraction, by visual estimation, is 60 to 65%. The left ventricle has normal function. There is no left ventricular hypertrophy.  2. The left ventricle has no regional wall motion abnormalities.  3. Global right ventricle has normal systolic function.The right ventricular size is normal. No increase in right ventricular wall thickness.  4. Left atrial size was normal.  5. Right atrial size was normal.  6. The mitral valve is normal in structure. Trivial mitral valve regurgitation. No evidence of mitral stenosis.  7. The tricuspid valve is normal in structure.  8. The aortic valve is normal in structure. Aortic valve regurgitation is not visualized. No evidence of aortic valve sclerosis or stenosis.  9. The pulmonic valve was normal in structure. Pulmonic valve regurgitation is not visualized.  10. Normal pulmonary artery systolic pressure.  11. The tricuspid regurgitant velocity is 2.28 m/s, and with an assumed ight atrial pressure of 3 mmHg, the estimated right ventricular systolic  pressure is normal at 23.8 mmHg.   12. The inferior vena cava is normal in size with greater than 50% respiratory variability, suggesting right atrial pressure of 3 mmHg.  13. The average left ventricular global longitudinal strain is -20.5 %.   CTA chest on April 05, 2020 FINDINGS:  Cardiovascular: Moderately good opacification of the central and  segmental pulmonary arteries. No focal filling defects. No evidence  of significant pulmonary embolus. Normal heart size. No pericardial  effusion. Normal caliber thoracic aorta. No evidence of aortic  dissection. Great vessel origins are patent.   Mediastinum/Nodes: No enlarged mediastinal, hilar, or axillary lymph  nodes. Thyroid gland, trachea, and esophagus demonstrate no  significant findings.   Lungs/Pleura: Patchy and confluent  airspace infiltrates in both  lungs most prominent in the bases. Changes are consistent with COVID  pneumonia. No pleural effusions. No pneumothorax. Airways are  patent.   Upper Abdomen: Surgical absence of the gallbladder. No acute  abnormalities demonstrated in the visualized upper abdomen.   Musculoskeletal: No chest wall abnormality. No acute or significant  osseous findings.   Review of the MIP images confirms the above findings.   IMPRESSION:  1. No evidence of significant pulmonary embolus.  2. Patchy and confluent airspace infiltrates in both lungs most  prominent in the bases. Changes are consistent with COVID pneumonia.     Recent Labs: 06/22/2019: Magnesium 2.1; TSH 0.775 04/08/2020: ALT 66; BUN 8; Creatinine, Ser 0.98; Hemoglobin 12.9; Platelets 293; Potassium 3.6; Sodium 142  Recent Lipid Panel    Component Value Date/Time   CHOL 196 05/31/2018 1023   TRIG 76 05/31/2018 1023   HDL 53 05/31/2018 1023   CHOLHDL 3.7 05/31/2018 1023   CHOLHDL 3 09/24/2014 0837   VLDL 9.6 09/24/2014 0837   LDLCALC 128 (H) 05/31/2018 1023    Physical Exam:    VS:  BP 114/68   Pulse (!) 104   Ht 5\' 9"  (1.753 m)   Wt 240 lb 9.6 oz  (109.1 kg)   SpO2 98%   BMI 35.53 kg/m     Wt Readings from Last 3 Encounters:  04/30/20 240 lb 9.6 oz (109.1 kg)  04/08/20 229 lb 0.9 oz (103.9 kg)  11/03/19 229 lb (103.9 kg)     GEN: Well nourished, well developed in no acute distress HEENT: Normal NECK: No JVD; No carotid bruits LYMPHATICS: No lymphadenopathy CARDIAC: S1S2 noted,RRR, no murmurs, rubs, gallops RESPIRATORY:  Clear to auscultation without rales, wheezing or rhonchi  ABDOMEN: Soft, non-tender, non-distended, +bowel sounds, no guarding. EXTREMITIES: No edema, No cyanosis, no clubbing MUSCULOSKELETAL:  No deformity  SKIN: Warm and dry NEUROLOGIC:  Alert and oriented x 3, non-focal PSYCHIATRIC:  Normal affect, good insight  ASSESSMENT:    1. Symptomatic PVCs   2. Pneumonia due to COVID-19 virus   3. Shortness of breath   4. Obesity (BMI 30-39.9)    PLAN:    She is experiencing significant palpitations.  I started the patient on Toprol-XL 12.5 mg daily since her monitor showed evidence of symptomatic PVCs.  She tells me that she has had some improvement but not resolved.  Therefore I am going to increase this to 12.5 mg twice daily for her Toprol-XL.  She recently had [place] COVID-19 pneumonia and her shortness of breath is getting worse and get her referred the patient to the pulmonary service.  The patient understands the need to lose weight with diet and exercise. We have discussed specific strategies for this.  The patient is in agreement with the above plan. The patient left the office in stable condition.  The patient will follow up in 3 months or sooner if needed.  Medication Adjustments/Labs and Tests Ordered: Current medicines are reviewed at length with the patient today.  Concerns regarding medicines are outlined above.  No orders of the defined types were placed in this encounter.  No orders of the defined types were placed in this encounter.   Patient Instructions  Medication Instructions:      START TAKING TOPROL XL 12.5 MG TWICE A DAY   *If you need a refill on your cardiac medications before your next appointment, please call your pharmacy*   Lab Work: NONE ORDERED  TODAY   If  you have labs (blood work) drawn today and your tests are completely normal, you will receive your results only by: Marland Kitchen MyChart Message (if you have MyChart) OR . A paper copy in the mail If you have any lab test that is abnormal or we need to change your treatment, we will call you to review the results.   Testing/Procedures: NONE ORDERED  TODAY   Follow-Up: At [facility], you and your health needs are our priority.  As part of our continuing mission to provide you with exceptional heart care, we have created designated Provider Care Teams.  These Care Teams include your primary Cardiologist (physician) and Advanced Practice Providers (APPs -  Physician Assistants and Nurse Practitioners) who all work together to provide you with the care you need, when you need it.  We recommend signing up for the patient portal called "MyChart".  Sign up information is provided on this After Visit Summary.  MyChart is used to connect with patients for Virtual Visits (Telemedicine).  Patients are able to view lab/test results, encounter notes, upcoming appointments, etc.  Non-urgent messages can be sent to your provider as well.   To learn more about what you can do with MyChart, go to [email or web address].    Your next appointment:   3 month(s)  The format for your next appointment:   In Person  Provider:   You will see Berniece Salines, DO.  Or, you can be scheduled with the following Advanced Practice Provider on your designated Care Team (at our [facility]):  Laurann Montana, FNP     Other Instructions  [place] REFFERED TO PULMONARY FOR SHORTNESS OF BREATH     Adopting a Healthy Lifestyle.  Know what a healthy weight is for you (roughly BMI <25) and aim to maintain this   Aim for  7+ servings of fruits and vegetables daily   65-80+ fluid ounces of water or unsweet tea for healthy kidneys   Limit to max 1 drink of alcohol per day; avoid smoking/tobacco   Limit animal fats in diet for cholesterol and heart health - choose grass fed whenever available   Avoid highly processed foods, and foods high in saturated/trans fats   Aim for low stress - take time to unwind and care for your mental health   Aim for 150 min of moderate intensity exercise weekly for heart health, and weights twice weekly for bone health   Aim for 7-9 hours of sleep daily   When it comes to diets, agreement about the perfect plan isnt easy to find, even among the experts. Experts at the [employer] developed an idea known as the Healthy Eating Plate. Just imagine a plate divided into logical, healthy portions.   The emphasis is on diet quality:   Load up on vegetables and fruits - one-half of your plate: Aim for color and variety, and remember that potatoes dont count.   Go for whole grains - one-quarter of your plate: Whole wheat, barley, wheat berries, quinoa, oats, brown rice, and foods made with them. If you want pasta, go with whole wheat pasta.   Protein power - one-quarter of your plate: Fish, chicken, beans, and nuts are all healthy, versatile protein sources. Limit red meat.   The diet, however, does go beyond the plate, offering a few other suggestions.   Use healthy plant oils, such as olive, canola, soy, corn, sunflower and peanut. Check the labels, and avoid partially hydrogenated oil, which have  unhealthy trans fats.   If youre thirsty, drink water. Coffee and tea are good in moderation, but skip sugary drinks and limit milk and dairy products to one or two daily servings.   The type of carbohydrate in the diet is more important than the amount. Some sources of carbohydrates, such as vegetables, fruits, whole grains, and beans-are healthier than others.     Finally, stay active  Signed, Berniece Salines, DO  04/30/2020 4:35 PM    [facility]

## 2020-05-05 IMAGING — CR DG CHEST 2V
1 series · 2 of 2 positions shown · non-contrast
Comparison: 03/03/2018.

CLINICAL DATA: Centralized chest pain, intermittent dizziness and
near-syncope. Nausea and vomiting. Shortness of breath and
headaches.

EXAM:
CHEST - 2 VIEW

[Series 1: dg chest 2 view · 0.14mm/px · 2 of 2 slices shown]
[im 1/2]
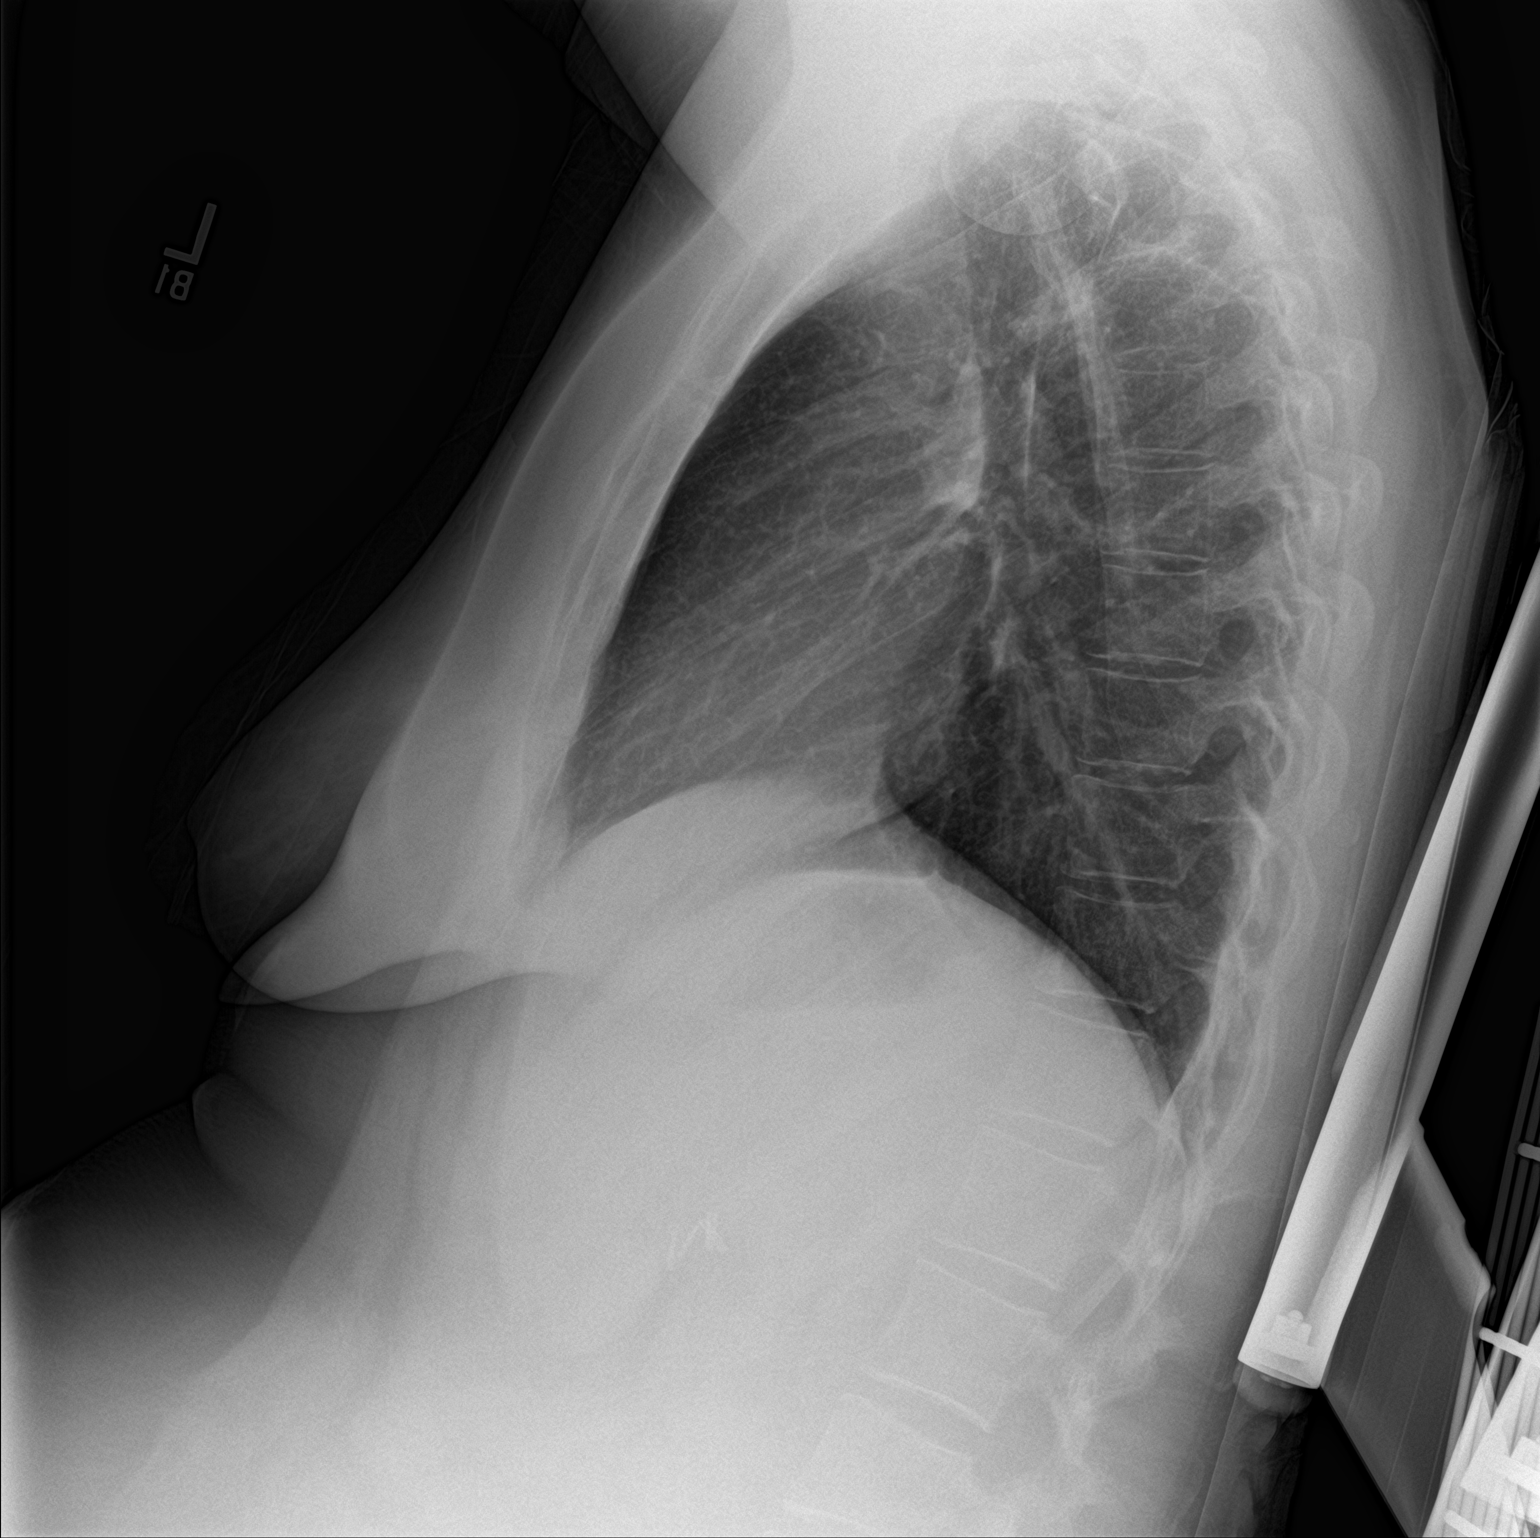
[im 2/2]
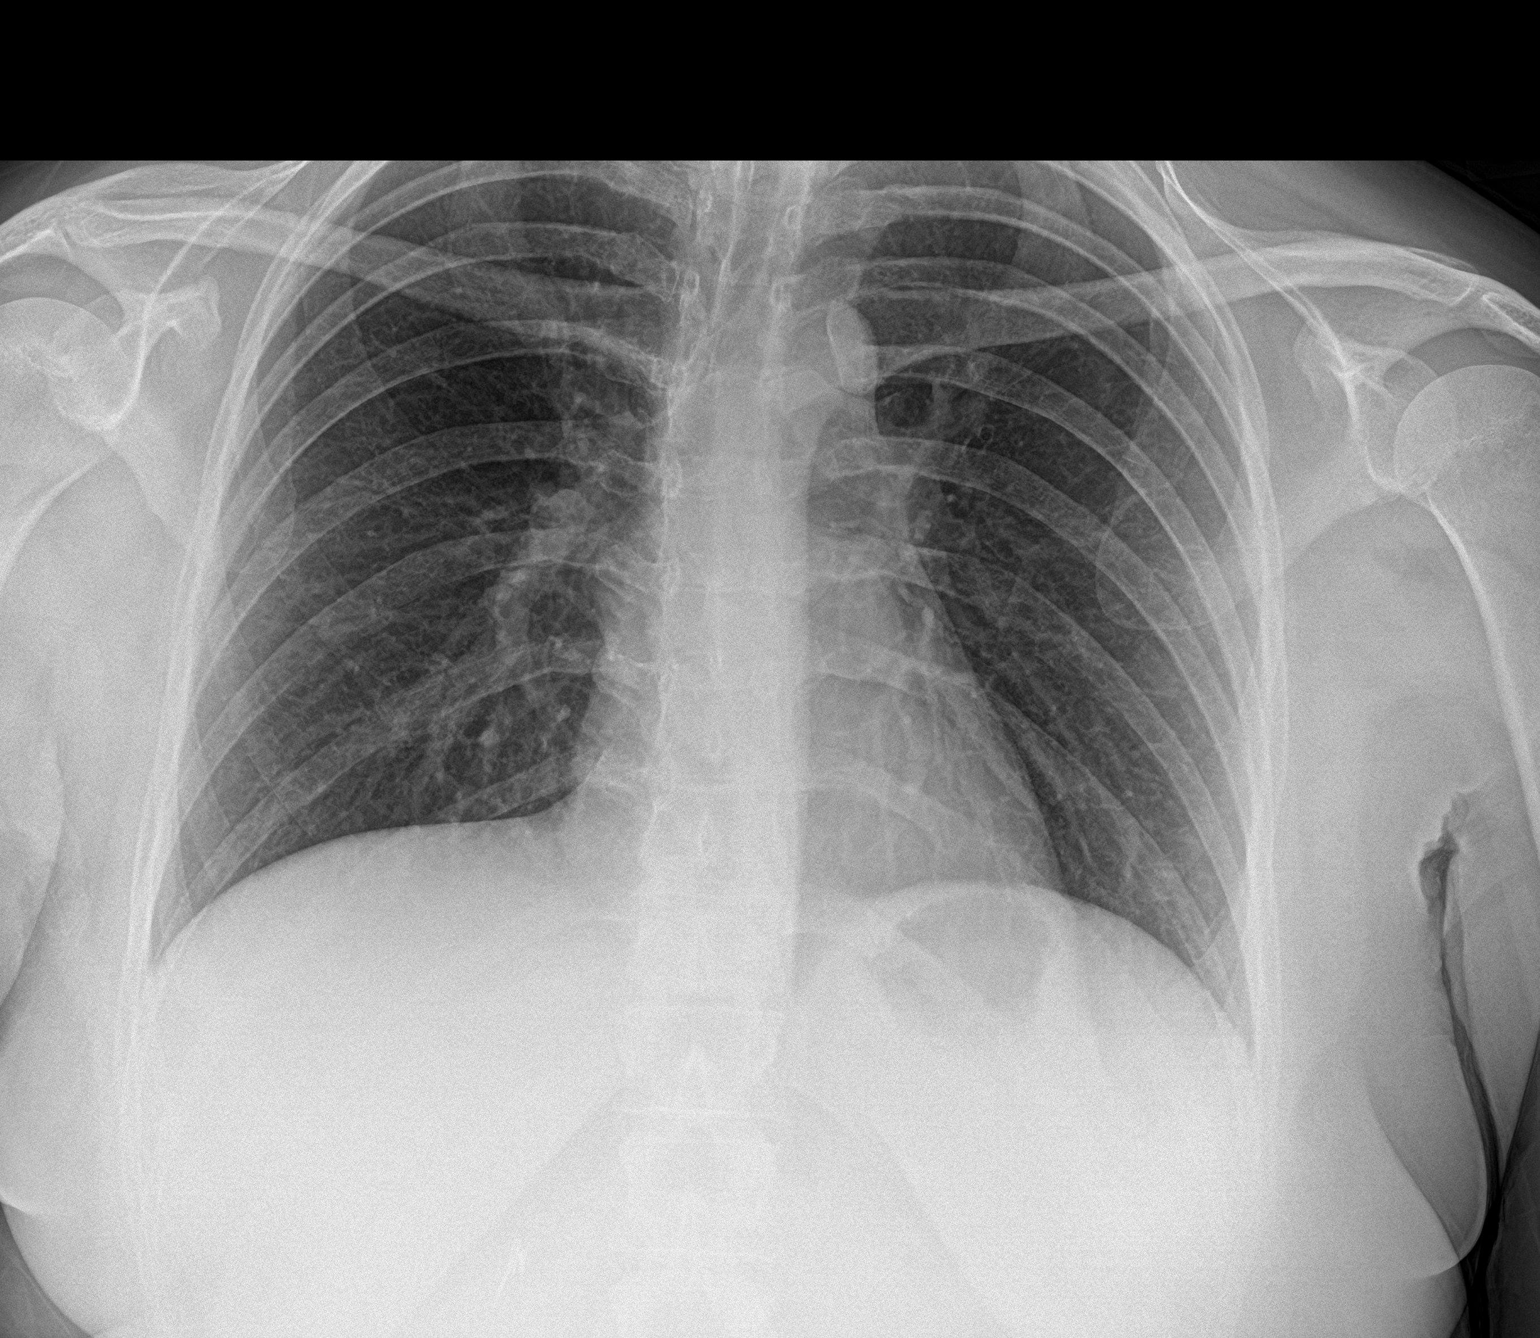

[2 of 2 positions shown; findings below may reference images not displayed]

FINDINGS: Trachea is midline. Heart size normal. Lungs are clear. No pleural
fluid.
IMPRESSION: No acute findings.

## 2020-05-29 ENCOUNTER — Telehealth: Payer: 59 | Admitting: Physician Assistant

## 2020-06-25 ENCOUNTER — Other Ambulatory Visit: Payer: Self-pay

## 2020-06-25 ENCOUNTER — Telehealth: Payer: Self-pay | Admitting: Physician Assistant

## 2020-06-25 MED ORDER — CLONAZEPAM 0.5 MG PO TABS: 0.5000 mg | ORAL_TABLET | Freq: Two times a day (BID) | ORAL | 0 refills | Status: DC | PRN

## 2020-06-25 NOTE — Telephone Encounter (Signed)
Last refill 04/24/20 Pended for Helene Kelp if appropriate to send

## 2020-06-25 NOTE — Telephone Encounter (Signed)
Patient has an appt on 07/01/20. Requesting refill on Klonopin called to [facility] on [address] [place].

## 2020-06-25 NOTE — Telephone Encounter (Signed)
Previous Rx was [facility] to [facility] [place].

## 2020-06-26 ENCOUNTER — Encounter: Payer: Self-pay | Admitting: Nurse Practitioner

## 2020-06-26 ENCOUNTER — Other Ambulatory Visit (INDEPENDENT_AMBULATORY_CARE_PROVIDER_SITE_OTHER): Payer: 59

## 2020-06-26 ENCOUNTER — Ambulatory Visit (INDEPENDENT_AMBULATORY_CARE_PROVIDER_SITE_OTHER): Payer: 59 | Admitting: Nurse Practitioner

## 2020-06-26 VITALS — BP 124/68 | HR 84 | Ht 69.0 in | Wt 241.0 lb

## 2020-06-26 DIAGNOSIS — R1013 Epigastric pain: Secondary | ICD-10-CM

## 2020-06-26 DIAGNOSIS — K219 Gastro-esophageal reflux disease without esophagitis: Secondary | ICD-10-CM

## 2020-06-26 DIAGNOSIS — R112 Nausea with vomiting, unspecified: Secondary | ICD-10-CM

## 2020-06-26 DIAGNOSIS — R110 Nausea: Secondary | ICD-10-CM

## 2020-06-26 LAB — CBC WITH DIFFERENTIAL/PLATELET
Basophils Absolute: 0 10*3/uL (ref 0.0–0.1)
Basophils Relative: 0.4 % (ref 0.0–3.0)
Eosinophils Absolute: 0 10*3/uL (ref 0.0–0.7)
Eosinophils Relative: 0.5 % (ref 0.0–5.0)
HCT: 39.5 % (ref 36.0–46.0)
Hemoglobin: 13.4 g/dL (ref 12.0–15.0)
Lymphocytes Relative: 18.7 % (ref 12.0–46.0)
Lymphs Abs: 1.4 10*3/uL (ref 0.7–4.0)
MCHC: 34.1 g/dL (ref 30.0–36.0)
MCV: 85.7 fl (ref 78.0–100.0)
Monocytes Absolute: 0.6 10*3/uL (ref 0.1–1.0)
Monocytes Relative: 7.1 % (ref 3.0–12.0)
Neutro Abs: 5.7 10*3/uL (ref 1.4–7.7)
Neutrophils Relative %: 73.3 % (ref 43.0–77.0)
Platelets: 389 10*3/uL (ref 150.0–400.0)
RBC: 4.6 Mil/uL (ref 3.87–5.11)
RDW: 13.3 % (ref 11.5–15.5)
WBC: 7.7 10*3/uL (ref 4.0–10.5)

## 2020-06-26 LAB — COMPREHENSIVE METABOLIC PANEL
ALT: 22 U/L (ref 0–35)
AST: 16 U/L (ref 0–37)
Albumin: 4.3 g/dL (ref 3.5–5.2)
Alkaline Phosphatase: 69 U/L (ref 39–117)
BUN: 11 mg/dL (ref 6–23)
CO2: 28 mEq/L (ref 19–32)
Calcium: 9.5 mg/dL (ref 8.4–10.5)
Chloride: 102 mEq/L (ref 96–112)
Creatinine, Ser: 0.89 mg/dL (ref 0.40–1.20)
GFR: 85.24 mL/min (ref >60.00)
Glucose, Bld: 87 mg/dL (ref 70–99)
Potassium: 4 mEq/L (ref 3.5–5.1)
Sodium: 137 mEq/L (ref 135–145)
Total Bilirubin: 0.4 mg/dL (ref 0.2–1.2)
Total Protein: 7.1 g/dL (ref 6.0–8.3)

## 2020-06-26 LAB — C-REACTIVE PROTEIN: CRP: <1 mg/dL (ref 0.5–20.0)

## 2020-06-26 LAB — LIPASE: Lipase: 26 U/L (ref 11.0–59.0)

## 2020-06-26 MED ORDER — HYOSCYAMINE SULFATE 0.125 MG SL SUBL
0.1250 mg | SUBLINGUAL_TABLET | Freq: Four times a day (QID) | SUBLINGUAL | 0 refills | Status: DC | PRN
Start: 2020-06-26 — End: 2020-07-23

## 2020-06-26 MED ORDER — ONDANSETRON 8 MG PO TBDP: 8.0000 mg | ORAL_TABLET | Freq: Three times a day (TID) | ORAL | 0 refills | Status: DC | PRN

## 2020-06-26 MED ORDER — ESOMEPRAZOLE MAGNESIUM 40 MG PO CPDR: 40.0000 mg | DELAYED_RELEASE_CAPSULE | Freq: Two times a day (BID) | ORAL | 1 refills | Status: DC

## 2020-06-26 NOTE — Patient Instructions (Signed)
If you are age 33 or older, your body mass index should be between 23-30. Your Body mass index is 35.59 kg/m. If this is out of the aforementioned range listed, please consider follow up with your Primary Care Provider.  If you are age 48 or younger, your body mass index should be between 19-25. Your Body mass index is 35.59 kg/m. If this is out of the aformentioned range listed, please consider follow up with your Primary Care Provider.   You have been scheduled for an abdominal ultrasound at [facility] (1st floor of hospital) on 07/01/20 at 10:30. Please arrive 15 minutes prior to your appointment for registration. Make certain not to have anything to eat or drink starting at midnight. Should you need to reschedule your appointment, please contact radiology at [phone]. This test typically takes about 30 minutes to perform.  Your provider has requested that you go to the basement level for lab work before leaving today. Press "B" on the elevator. The lab is located at the first door on the left as you exit the elevator.  INCREASE Esomeprazole 40 mg 1 capsule twice daily. START Hyoscyamine 0.125 mg 1 tablet under the tongue every 6 hours as needed for abdominal pain.  Drink clear fluids/ soft solid foods.  Consider scheduling and Endoscopy if your labs/ultrasound are normal.  USE Ondansetron ODT 8 mg 1 tablet every 6-8 hours as needed.  Get gaviscon 1 tablespoon three times a day. This is over the counter.  Follow up pending the results of imaging and labs.

## 2020-06-26 NOTE — Progress Notes (Signed)
06/26/2020 Brandy [name] [id number] [birth year]   Chief Complaint: Nausea and abdominal pain   History of Present Illness: Brandy [name] is a 34 year old female with a past medical history of anxiety, depression, kidney stones, pituitary adenoma, GERD and Covid 19 pneumonia 03/2020. She is followed by Dr. Carlean Purl. She underwent a tele-visit by Ellouise Newer 11/04/2018 due to having N/V, alternating diarrhea and constipation. H. Pylori stool ag test was ordered but not done. She was prescribed Pepcid, Zofran, Dicyclomine, GI cocktail and Preparation H suppositories and her symptoms improved.   She presents today for further evaluation for nausea and epigastric pain which has progressively worsened since Monday 06/24/2020.  She awakened from sleep early Monday a.m. with significant epigastric pain.  She reported eating baked chicken, broccoli, carrots and pinto beans for dinner the evening before.  She had some nausea but no vomiting.  She is also having intermittent pain between her shoulder blades for the past year.  She reported taking aspirin 81 mg once or twice for headaches and Advil 200 mg 2 tablets 4-5 times over the past 2 months due to the pain between her shoulder blades.  She underwent a cholecystectomy secondary to gallstones in 2014. Her central upper abdominal pain worsens after she eats any food. She sometimes feels numbness to her epigastric area which radiates up into her arms.  She has a history of GERD done since 2019, never had an EGD.  Her heartburn is fairly well controlled on Esomeprazole 40 mg once daily.  She tried Pantoprazole in the past which resulted in worse stomach pain.  Mother with history of stomach cancer.  She reports having umbilical hernia.  She has a history of kidney stones for which she underwent an abdominal/pelvic CT without contrast by [facility] 3 weeks ago which she reported showed evidence of kidney stones without obstruction.  I will request a copy  of this report for further review.  Last menstrual cycle was approximately 20 days ago, she denies chance of pregnancy as her husband had a vasectomy.  She reports having diarrhea which started on Saturday 06/22/2020 after she ate a fried cheeseburger and [place] fries at the fair.  Her husband is present, he reported having H. pylori infection a few years ago.  She had a COVID-19 pneumonia 03/2020, see laboratory results below.  Her LFTs were elevated at the time of her Covid infection.   CBC Latest Ref Rng & Units 04/08/2020 11/05/2018 08/08/2018  WBC 4.0 - 10.5 K/uL 7.1 7.8 11.7(H)  Hemoglobin 12.0 - 15.0 g/dL 12.9 13.1 12.9  Hematocrit 36 - 46 % 40.8 41.6 42.1  Platelets 150 - 400 K/uL 293 335 337   CMP Latest Ref Rng & Units 04/08/2020 06/22/2019 01/18/2019  Glucose 70 - 99 mg/dL 114(H) 97 88  BUN 6 - 20 mg/dL 8 10 12   Creatinine 0.44 - 1.00 mg/dL 0.98 0.89 0.93  Sodium 135 - 145 mmol/L 142 141 139  Potassium 3.5 - 5.1 mmol/L 3.6 4.6 4.0  Chloride 98 - 111 mmol/L 106 103 103  CO2 22 - 32 mmol/L 23 24 28   Calcium 8.9 - 10.3 mg/dL 8.6(L) 9.9 8.9  Total Protein 6.5 - 8.1 g/dL 7.1 - 7.1  Total Bilirubin 0.3 - 1.2 mg/dL 0.7 - 0.3  Alkaline Phos 38 - 126 U/L 59 - 48  AST 15 - 41 U/L 46(H) - 15  ALT 0 - 44 U/L 66(H) - 12    ECHO 08/30/2019: 1. Left ventricular  ejection fraction, by visual estimation, is 60 to 65%. The left ventricle has normal function. There is no left ventricular hypertrophy. 2. The left ventricle has no regional wall motion abnormalities. 3. Global right ventricle has normal systolic function.The right ventricular size is normal. No increase in right ventricular wall thickness.  Current Outpatient Medications on File Prior to Visit  Medication Sig Dispense Refill  . acetaminophen (TYLENOL) 500 MG tablet Take 1,000 mg by mouth every 6 (six) hours as needed for mild pain or headache.     . Cholecalciferol (VITAMIN D) 2000 units CAPS Take 2,000 Units by mouth daily.    .  clonazePAM (KLONOPIN) 0.5 MG tablet Take 1 tablet (0.5 mg total) by mouth 2 (two) times daily as needed for anxiety. Needs OV for more 12 tablet 0  . guaiFENesin (MUCINEX) 600 MG 12 hr tablet Take by mouth 2 (two) times daily as needed for cough.    . metoprolol succinate (TOPROL XL) 25 MG 24 hr tablet Take 0.5 tablets (12.5 mg total) by mouth 2 (two) times daily. 90 tablet 1  . Probiotic Product (PROBIOTIC PO) Take 1 capsule by mouth daily.    . [DISCONTINUED] DULoxetine (CYMBALTA) 20 MG capsule Take 1 capsule (20 mg total) by mouth daily. (Patient not taking: Reported on 03/20/2019) 30 capsule 1   No current facility-administered medications on file prior to visit.   Allergies  Allergen Reactions  . Metoclopramide Palpitations    Rapid heart rate  . Penicillins Hives, Rash and Other (See Comments)    Childhood reaction Has patient had a PCN reaction causing immediate rash, facial/tongue/throat swelling, SOB or lightheadedness with hypotension: No Has patient had a PCN reaction causing severe rash involving mucus membranes or skin necrosis: No Has patient had a PCN reaction that required hospitalization No Has patient had a PCN reaction occurring within the last 10 years: No If all of the above answers are "NO", then may proceed with Cephalosporin use.  Eduard Roux Other (See Comments)    Other reaction(s): Myalgias (intolerance) Constipation, hair loss  . Fluvoxamine Other (See Comments)    Other reaction(s): Fainted  . Sumatriptan Palpitations  . Zoloft [Sertraline Hcl] Anxiety     Current Medications, Allergies, Past Medical History, Past Surgical History, Family History and Social History were reviewed in [employer] record.   Review of Systems:   Constitutional: Negative for fever, sweats, chills or weight loss.  Respiratory: Negative for shortness of breath.   Cardiovascular: Negative for chest pain, palpitations and leg swelling.    Gastrointestinal: See HPI.  Musculoskeletal: + pain between the shoulder blades.  Neurological: Negative for dizziness, headaches or paresthesias.    Physical Exam: BP 124/68   Pulse 84   Ht 5\' 9"  (1.753 m)   Wt 241 lb (109.3 kg)   BMI 35.59 kg/m   General: Well developed 34 year old female in no acute distress. Head: Normocephalic and atraumatic. Eyes: No scleral icterus. Conjunctiva pink . Ears: Normal auditory acuity. Mouth: Dentition intact. No ulcers or lesions.  Lungs: Clear throughout to auscultation. Heart: Regular rate and rhythm, no murmur. Abdomen: Soft, nondistended. Moderate tenderness halfway between the xyphoid and umbilicus without rebound or guarding. No masses or hepatomegaly. Normal bowel sounds x 4 quadrants.  Rectal: Deferred. Musculoskeletal: Symmetrical with no gross deformities. Extremities: No edema. Neurological: Alert oriented x 4. No focal deficits.  Psychological: Alert and cooperative. Normal mood and affect  Assessment and Recommendations:  107. 34 year old female with  central upper abdominal pain and nausea. S/P cholecystectomy in 2014 secondary to gallstones. -Request copy of CTAP without contrast by [facility] done approximately 3 weeks ago -Abdominal sonogram ASAP -CBC, CMP, lipase and CRP now -Zofran 8mg  ODT one tab Q 6 hrs PRN N/V -Hyoscyamine 0.125mg  one tab dissolve under tongue Q 6 to 8 hrs PRN -Gaviscon 1 tbsp tid PRN -Increase Esomeprazole 40mg  po bid -Clear liquids, soft foods for now, avoid fatty foods -To ED if severe abdominal pain develops  -No NSAIDS  2. GERD -See plan in # 1 -EGD to rule out GERD/PUD/H. Pylori/upper GI malignancy. EGD to be scheduled after the above lab and abdominal sonogram results received. EGD benefits and risks discussed including risk with sedation, risk of bleeding, perforation and infection. Patient prefers to avoid EGD if possible. -H. Pylori IgG level    3. Family History of gastric cancer  (mother)  72. Covid 19 pneumonia 03/2020  5. Elevated AST/ALT 38/2021, most likely secondary to Covid infection  -Hepatic panel

## 2020-06-26 NOTE — Telephone Encounter (Signed)
Error

## 2020-07-01 ENCOUNTER — Other Ambulatory Visit: Payer: Self-pay

## 2020-07-01 ENCOUNTER — Telehealth (INDEPENDENT_AMBULATORY_CARE_PROVIDER_SITE_OTHER): Payer: 59 | Admitting: Physician Assistant

## 2020-07-01 ENCOUNTER — Ambulatory Visit (HOSPITAL_COMMUNITY)
Admission: RE | Admit: 2020-07-01 | Discharge: 2020-07-01 | Disposition: A | Discharge status: Home / Self Care | Payer: 59 | Source: Ambulatory Visit | Attending: Nurse Practitioner | Admitting: Nurse Practitioner

## 2020-07-01 ENCOUNTER — Encounter: Payer: Self-pay | Admitting: Physician Assistant

## 2020-07-01 DIAGNOSIS — R110 Nausea: Secondary | ICD-10-CM | POA: Diagnosis present

## 2020-07-01 DIAGNOSIS — R1013 Epigastric pain: Secondary | ICD-10-CM | POA: Diagnosis present

## 2020-07-01 DIAGNOSIS — K219 Gastro-esophageal reflux disease without esophagitis: Secondary | ICD-10-CM | POA: Diagnosis present

## 2020-07-01 DIAGNOSIS — F5105 Insomnia due to other mental disorder: Secondary | ICD-10-CM

## 2020-07-01 DIAGNOSIS — F99 Mental disorder, not otherwise specified: Secondary | ICD-10-CM

## 2020-07-01 DIAGNOSIS — F411 Generalized anxiety disorder: Secondary | ICD-10-CM | POA: Diagnosis not present

## 2020-07-01 MED ORDER — CLONAZEPAM 0.5 MG PO TABS: 0.5000 mg | ORAL_TABLET | Freq: Two times a day (BID) | ORAL | 1 refills | Status: DC | PRN

## 2020-07-01 NOTE — Progress Notes (Signed)
[facility] Check  Patient ID: Brandy [name],  MRN: [id number]  PCP: Lujean Amel, MD  Date of Evaluation: 07/01/2020 Time spent:20 minutes  Chief Complaint:  Chief Complaint    Anxiety     Virtual Visit via Telehealth  I connected with patient by a video enabled telemedicine application with their informed consent, and verified patient privacy and that I am speaking with the correct person using two identifiers.  I am private, in my office and the patient is in her cousin.   I discussed the limitations, risks, security and privacy concerns of performing an evaluation and management service by video and the availability of in person appointments. I also discussed with the patient that there may be a patient responsible charge related to this service. The patient expressed understanding and agreed to proceed.   I discussed the assessment and treatment plan with the patient. The patient was provided an opportunity to ask questions and all were answered. The patient agreed with the plan and demonstrated an understanding of the instructions.   The patient was advised to call back or seek an in-person evaluation if the symptoms worsen or if the condition fails to improve as anticipated.  I provided 20 minutes of non-face-to-face time during this encounter.   HISTORY/CURRENT STATUS: HPI For 6 month med check.  Has had a rough year.  In May, her 14 yo cousin was dx with Glioblastoma in May. She's still living but the prognosis is not good.  Then a few days after she heard about that diagnosis, her husband had to have an emergency appendectomy.  Then within the next few months, her 44 year old dog died.  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  "I was around people who had cancer and were dying all the time and I am glad I am out of there."  Plus her cardiologist put her on metoprolol which has helped with the  palpitations and tachycardia.  She is still taking the Klonopin usually once in the evening.  She had increased it up to 0.5 mg where she had only been taking a half of a pill.  She is needed the whole pill in the past 6 months or so.  She really wants to wean off of it, but it just has not been a good time.  She is able to enjoy things.  Her daughter is 42 years old now and she is enjoying having time to be with her.  Work is going well. Energy and motivation are good, except for being tired from having a 66-year-old.  She does not cry easily.  Appetite is normal.  Weight is stable.  Denies suicidal or homicidal thoughts.  Denies increased energy with decreased need for sleep.  No impulsivity or risky behavior, no increased libido or increased spending.  Denies dizziness, syncope, seizures, numbness, tingling, tremor, tics, unsteady gait, slurred speech, confusion. Denies muscle or joint pain, stiffness, or dystonia.  Individual Medical History/ Review of Systems: Changes? :No    Past medications for mental health diagnoses include: Prozac, Luvox, Elavil, Ativan, Xanax, Zoloft, Seroquel, Lithium for 1 pill, Buspar, Inderal, Cymbalta, NAC, Turmeric  Allergies: Metoclopramide, Penicillins, Erenumab-aooe, Fluvoxamine, Sumatriptan, and Zoloft [sertraline hcl]  Current Medications:  Current Outpatient Medications:  .  acetaminophen (TYLENOL) 500 MG tablet, Take 1,000 mg by mouth every 6 (six) hours as needed for mild pain or headache. , Disp: , Rfl:  .  Cholecalciferol (VITAMIN D) 2000 units CAPS, Take 2,000 Units by mouth daily., Disp: , Rfl:  .  clonazePAM (KLONOPIN) 0.5 MG tablet, Take 1 tablet (0.5 mg total) by mouth 2 (two) times daily as needed for anxiety. Needs OV for more, Disp: 60 tablet, Rfl: 1 .  esomeprazole (NEXIUM) 40 MG capsule, Take 1 capsule (40 mg total) by mouth 2 (two) times daily before a meal., Disp: 60 capsule, Rfl: 1 .  guaiFENesin (MUCINEX) 600 MG 12 hr tablet, Take by mouth  2 (two) times daily as needed for cough., Disp: , Rfl:  .  hyoscyamine (LEVSIN SL) 0.125 MG SL tablet, Place 1 tablet (0.125 mg total) under the tongue every 6 (six) hours as needed (abdominal pain)., Disp: 30 tablet, Rfl: 0 .  metoprolol succinate (TOPROL XL) 25 MG 24 hr tablet, Take 0.5 tablets (12.5 mg total) by mouth 2 (two) times daily., Disp: 90 tablet, Rfl: 1 .  ondansetron (ZOFRAN ODT) 8 MG disintegrating tablet, Take 1 tablet (8 mg total) by mouth every 8 (eight) hours as needed for nausea or vomiting., Disp: 20 tablet, Rfl: 0 .  Probiotic Product (PROBIOTIC PO), Take 1 capsule by mouth daily., Disp: , Rfl:  Medication Side Effects: none  Family Medical/ Social History: Changes?  No  MENTAL HEALTH EXAM:  There were no vitals taken for this visit.There is no height or weight on file to calculate BMI.  General Appearance: Casual, Neat and Well Groomed  Eye Contact:  Good  Speech:  Clear and Coherent and Normal Rate  Volume:  Normal  Mood:  Euthymic  Affect:  Appropriate  Thought Process:  Goal Directed and Descriptions of Associations: Circumstantial  Orientation:  Full (Time, Place, and Person)  Thought Content: Logical   Suicidal Thoughts:  No  Homicidal Thoughts:  No  Memory:  WNL  Judgement:  Good  Insight:  Good  Psychomotor Activity:  Normal  Concentration:  Concentration: Good  Recall:  Good  Fund of Knowledge: Good  Language: Good  Assets:  Desire for Improvement  ADL's:  Intact  Cognition: WNL  Prognosis:  Good    DIAGNOSES:    ICD-10-CM   1. 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 I provided 20 minutes of nonface-to-face time during this encounter. We discussed the anxiety.  I am glad she is able to keep the Klonopin in the evenings only.  I am glad she is on the metoprolol 2, which helps with the anxiety.  I reminded her that  the holidays can be very stressful and I would not recommend trying to stop the Klonopin right now.  Really, because she is taking such a low dose she really does not need to wean off of it, but when she does choose to do so, take 1/2 pill for 2 weeks and then is close to one fourth of the pill as possible for 2 weeks and then stop. Continue Klonopin 0.5 mg, 1/2-1 p.o. twice daily as needed. Return in 6 months.  Donnal Moat, PA-C

## 2020-07-12 ENCOUNTER — Other Ambulatory Visit: Payer: 59

## 2020-07-12 DIAGNOSIS — R1013 Epigastric pain: Secondary | ICD-10-CM

## 2020-07-12 DIAGNOSIS — K219 Gastro-esophageal reflux disease without esophagitis: Secondary | ICD-10-CM

## 2020-07-12 DIAGNOSIS — R110 Nausea: Secondary | ICD-10-CM

## 2020-07-15 LAB — HELICOBACTER PYLORI  SPECIAL ANTIGEN
MICRO NUMBER:: 11197233
SPECIMEN QUALITY: ADEQUATE

## 2020-07-18 ENCOUNTER — Other Ambulatory Visit: Payer: Self-pay | Admitting: Nurse Practitioner

## 2020-07-23 ENCOUNTER — Encounter: Payer: Self-pay | Admitting: Pulmonary Disease

## 2020-07-23 ENCOUNTER — Ambulatory Visit (INDEPENDENT_AMBULATORY_CARE_PROVIDER_SITE_OTHER): Payer: 59 | Admitting: Pulmonary Disease

## 2020-07-23 ENCOUNTER — Other Ambulatory Visit: Payer: Self-pay

## 2020-07-23 VITALS — BP 118/76 | HR 100 | Temp 98.6°F | Ht 69.0 in | Wt 240.6 lb

## 2020-07-23 DIAGNOSIS — J3089 Other allergic rhinitis: Secondary | ICD-10-CM

## 2020-07-23 NOTE — Progress Notes (Signed)
Chastidy Ranker    [id number]    [birth year]  Primary Care Physician:Koirala, Dibas, MD  Referring Physician: Berniece Salines, DO [address] [place],  [place] [address]  Chief complaint:   Patient being seen for shortness of breath  HPI:  Diagnosed with Covid in August, received monoclonal antibody  She was having shortness of breath even prior to that Tachycardia with PVCs noted on monitoring  Denies a prior history of asthma, mother and sibling has asthma Denies any wheezing  Very minimal activity makes her short of breath sometimes  Since Covid has noticed more shortness of breath although this seems to be slightly improved Never smoker Was not around significant secondhand smoke  Nasal stuffiness and congestion, reports having allergies but never tested  Outpatient Encounter Medications as of 07/23/2020  Medication Sig  . acetaminophen (TYLENOL) 500 MG tablet Take 1,000 mg by mouth every 6 (six) hours as needed for mild pain or headache.   . Cholecalciferol (VITAMIN D) 2000 units CAPS Take 2,000 Units by mouth daily.  . clonazePAM (KLONOPIN) 0.5 MG tablet Take 1 tablet (0.5 mg total) by mouth 2 (two) times daily as needed for anxiety. Needs OV for more  . esomeprazole (NEXIUM) 40 MG capsule TAKE 1 CAPSULE (40 MG TOTAL) BY MOUTH 2 (TWO) TIMES DAILY BEFORE A MEAL.  Marland Kitchen guaiFENesin (MUCINEX) 600 MG 12 hr tablet Take by mouth 2 (two) times daily as needed for cough.  . metoprolol succinate (TOPROL XL) 25 MG 24 hr tablet Take 0.5 tablets (12.5 mg total) by mouth 2 (two) times daily.  . ondansetron (ZOFRAN ODT) 8 MG disintegrating tablet Take 1 tablet (8 mg total) by mouth every 8 (eight) hours as needed for nausea or vomiting.  . Probiotic Product (PROBIOTIC PO) Take 1 capsule by mouth daily.  . [DISCONTINUED] DULoxetine (CYMBALTA) 20 MG capsule Take 1 capsule (20 mg total) by mouth daily. (Patient not taking: Reported on 03/20/2019)  . [DISCONTINUED] hyoscyamine (LEVSIN  SL) 0.125 MG SL tablet Place 1 tablet (0.125 mg total) under the tongue every 6 (six) hours as needed (abdominal pain).   No facility-administered encounter medications on file as of 07/23/2020.    Allergies as of 07/23/2020 - Review Complete 07/23/2020  Allergen Reaction Noted  . Metoclopramide Palpitations 03/16/2018  . Penicillins Hives, Rash, and Other (See Comments) 07/18/2014  . Erenumab-aooe Other (See Comments) 06/02/2018  . Fluvoxamine Other (See Comments) 06/30/2018  . Sumatriptan Palpitations 06/02/2018  . Zoloft [sertraline hcl] Anxiety 04/08/2018    Past Medical History:  Diagnosis Date  . Abdominal pain, epigastric 02/18/2017  . Anterior neck pain 12/30/2015  . Anxiety   . Anxiety associated with birthing process 06/14/2018  . B12 deficiency 07/21/2016  . Chronic mixed headache syndrome 06/14/2018  . Depression   . DUB (dysfunctional uterine bleeding) 06/04/2011  . Dysphagia 07/20/2018  . Enlarged thyroid gland 12/30/2015  . Fatigue 12/30/2015  . Gallstones   . Generalized anxiety disorder 06/09/2016  . GERD (gastroesophageal reflux disease) 02/19/2011  . Headache, unspecified headache type 03/05/2018  . History of nephrolithiasis   . Kidney stone   . Medication overuse headache 06/14/2018  . Menorrhagia 11/09/2018  . Migraine   . Missed abortion 11/09/2018  . Nausea vomiting and diarrhea 12/28/2013  . Nausea without vomiting 02/18/2017  . NEPHROLITHIASIS, HX OF 12/16/2007   Qualifier: Diagnosis of  By: Sherren Mocha MD, Jory Ee   . Palpitations 03/21/2018  . Pituitary tumor   .  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 Precordial pain 03/21/2018  . Routine general medical examination at a health care facility 09/27/2014  . Serum calcium elevated 12/28/2013  . SVD (spontaneous vaginal delivery) 02/02/2018  . Tendinitis of right ankle 08/14/2014  . 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 Vitamin D deficiency     Past Surgical History:  Procedure Laterality Date  . CHOLECYSTECTOMY  2014    . KIDNEY STONE SURGERY     x 5  . MOUTH SURGERY    . TYMPANOSTOMY TUBE PLACEMENT      Family History  Problem Relation Age of Onset  . Diabetes Mother   . Mental illness Mother   . Asthma Mother   . Stomach cancer Mother   . Colon polyps Mother   . Heart disease Mother   . Hypertension Mother   . Arthritis Mother   . Diabetes Father   . Hypertension Father   . Anxiety disorder Sister   . Colon cancer Maternal Grandmother   . Diabetes Paternal Grandmother   . Breast cancer Maternal Aunt   . Lung cancer Maternal Aunt   . Diabetes Paternal Aunt   . Diabetes Maternal Uncle   . Diabetes Maternal Uncle     Social History   Socioeconomic History  . Marital status: Married    Spouse name: Jonathon  . Number of children: 1  . Years of education: Not on file  . Highest education level: Bachelor's degree (e.g., BA, AB, BS)  Occupational History  . Occupation: Marine scientist: [id number]  Tobacco Use  . Smoking status: Never Smoker  . Smokeless tobacco: Never Used  Vaping Use  . Vaping Use: Never used  Substance and Sexual Activity  . Alcohol use: Not Currently  . Drug use: No  . Sexual activity: Yes  Other Topics Concern  . Not on file  Social History Narrative   Patient is right-handed. She lives with her husband and child in a split-level home.    Daughter born 01/2018   She avoids caffeine.    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[employer]      Patient is now working at [employer] as [employer] 01/17/19   Social Determinants of Health   Financial Resource Strain:   . Difficulty of Paying Living Expenses: Not on file  Food Insecurity:   . Worried About Charity fundraiser in the Last Year: Not on file  . Ran Out of Food in the Last Year: Not on file  Transportation Needs:   . Lack of Transportation (Medical): Not on file  . Lack of Transportation (Non-Medical): Not on file  Physical Activity:   . Days of Exercise per Week: Not on file  .  Minutes of Exercise per Session: Not on file  Stress:   . Feeling of Stress : Not on file  Social Connections:   . Frequency of Communication with Friends and Family: Not on file  . Frequency of Social Gatherings with Friends and Family: Not on file  . Attends Religious Services: Not on file  . Active Member of Clubs or Organizations: Not on file  . Attends Archivist Meetings: Not on file  . Marital Status: Not on file  Intimate Partner Violence:   . Fear of Current or Ex-Partner: Not on file  . Emotionally Abused: Not on file  . Physically Abused: Not on file  . Sexually Abused: Not on file    Review of  Systems  Respiratory: Positive for shortness of breath.   Cardiovascular: Positive for palpitations.  Psychiatric/Behavioral: Negative for sleep disturbance.  All other systems reviewed and are negative.   Vitals:   07/23/20 1421  BP: 118/76  Pulse: 100  Temp: 98.6 F (37 C)  SpO2: 96%     Physical Exam Constitutional:      Appearance: She is obese.  HENT:     Nose: No congestion.     Mouth/Throat:     Mouth: Mucous membranes are moist.  Eyes:     General:        Right eye: No discharge.     Pupils: Pupils are equal, round, and reactive to light.  Cardiovascular:     Rate and Rhythm: Normal rate and regular rhythm.     Heart sounds: No murmur heard.  No friction rub.  Pulmonary:     Effort: Pulmonary effort is normal. No respiratory distress.     Breath sounds: Normal breath sounds. No stridor. No wheezing or rhonchi.  Musculoskeletal:     Cervical back: No rigidity or tenderness.  Neurological:     Mental Status: She is alert.  Psychiatric:        Mood and Affect: Mood normal.    Data Reviewed: Recent chest x-ray shows bibasal infiltrate  Previous CT scan of the chest shows 5 mm lung nodule at both bases from 2019  Assessment:  Shortness of breath -Multifactorial -Recent Covid -Was having some shortness of breath even prior to the  Covid  PVCs/tachyarrhythmia -She has noticed palpitations even when getting up from sitting position sometimes  Obesity  Recent Covid pneumonia -She did have some symptoms even prior to Covid infection -States symptoms are improving  Plan/Recommendations: Allergy testing  We will consider pulmonary function test when more stable we will see in about 6 weeks  We will consider CT scan of the chest  With significant tachycardia just from a sitting to standing position and PVCs noted, may have a tachyarrhythmia syndrome  Encouraged to call with any significant concerns     Sherrilyn Rist MD [facility] 07/23/2020, 2:30 PM  CC: Tobb, Godfrey Pick, DO

## 2020-07-23 NOTE — Patient Instructions (Signed)
Allergy testing-allergic rhinitis, shortness of breath  Continue medications for the rapid heart rate  Tentatively see you in about 6 weeks   If you still have significant symptoms of shortness of breath at the time we can consider repeating a CT scan and a breathing study

## 2020-07-24 LAB — RESPIRATORY ALLERGY PROFILE REGION II ~~LOC~~
Allergen, A. alternata, m6: <0.1 kU/L
Allergen, Cedar tree, t12: <0.1 kU/L
Allergen, Comm Silver Birch, t9: <0.1 kU/L
Allergen, Cottonwood, t14: <0.1 kU/L
Allergen, D pternoyssinus,d7: 9.08 kU/L — ABNORMAL HIGH
Allergen, Mouse Urine Protein, e78: <0.1 kU/L
Allergen, Mulberry, t76: <0.1 kU/L
Allergen, Oak,t7: <0.1 kU/L
Allergen, P. notatum, m1: <0.1 kU/L
Aspergillus fumigatus, m3: <0.1 kU/L
Bermuda Grass: <0.1 kU/L
Box Elder IgE: <0.1 kU/L
CLADOSPORIUM HERBARUM (M2) IGE: <0.1 kU/L
COMMON RAGWEED (SHORT) (W1) IGE: <0.1 kU/L
Cat Dander: <0.1 kU/L
Class: 0
Class: 0
Class: 0
Class: 0
Class: 0
Class: 0
Class: 0
Class: 0
Class: 0
Class: 0
Class: 0
Class: 0
Class: 0
Class: 0
Class: 0
Class: 0
Class: 0
Class: 0
Class: 0
Class: 0
Class: 0
Class: 2
Class: 3
Class: 3
Cockroach: <0.1 kU/L
D. farinae: 14 kU/L — ABNORMAL HIGH
Dog Dander: 1.55 kU/L — ABNORMAL HIGH
Elm IgE: <0.1 kU/L
IgE (Immunoglobulin E), Serum: 55 kU/L (ref <=114)
Johnson Grass: <0.1 kU/L
Pecan/Hickory Tree IgE: <0.1 kU/L
Rough Pigweed  IgE: <0.1 kU/L
Sheep Sorrel IgE: <0.1 kU/L
Timothy Grass: <0.1 kU/L

## 2020-07-24 LAB — INTERPRETATION:

## 2020-07-26 ENCOUNTER — Emergency Department (HOSPITAL_COMMUNITY): Payer: 59

## 2020-07-26 ENCOUNTER — Emergency Department (HOSPITAL_COMMUNITY)
Admission: EM | Admit: 2020-07-26 | Discharge: 2020-07-26 | Disposition: A | Discharge status: Home / Self Care | Payer: 59 | Attending: Emergency Medicine | Admitting: Emergency Medicine

## 2020-07-26 ENCOUNTER — Other Ambulatory Visit: Payer: Self-pay

## 2020-07-26 ENCOUNTER — Encounter (HOSPITAL_COMMUNITY): Payer: Self-pay | Admitting: Emergency Medicine

## 2020-07-26 DIAGNOSIS — R079 Chest pain, unspecified: Secondary | ICD-10-CM | POA: Diagnosis present

## 2020-07-26 DIAGNOSIS — Z20822 Contact with and (suspected) exposure to covid-19: Secondary | ICD-10-CM | POA: Diagnosis not present

## 2020-07-26 DIAGNOSIS — R110 Nausea: Secondary | ICD-10-CM | POA: Diagnosis not present

## 2020-07-26 DIAGNOSIS — R0602 Shortness of breath: Secondary | ICD-10-CM | POA: Insufficient documentation

## 2020-07-26 DIAGNOSIS — J069 Acute upper respiratory infection, unspecified: Secondary | ICD-10-CM

## 2020-07-26 LAB — CBC WITH DIFFERENTIAL/PLATELET
Abs Immature Granulocytes: 0.04 10*3/uL (ref 0.00–0.07)
Basophils Absolute: 0.1 10*3/uL (ref 0.0–0.1)
Basophils Relative: 1 %
Eosinophils Absolute: 0.3 10*3/uL (ref 0.0–0.5)
Eosinophils Relative: 3 %
HCT: 42.4 % (ref 36.0–46.0)
Hemoglobin: 13.7 g/dL (ref 12.0–15.0)
Immature Granulocytes: 0 %
Lymphocytes Relative: 22 %
Lymphs Abs: 2.3 10*3/uL (ref 0.7–4.0)
MCH: 29.3 pg (ref 26.0–34.0)
MCHC: 32.3 g/dL (ref 30.0–36.0)
MCV: 90.6 fL (ref 80.0–100.0)
Monocytes Absolute: 0.8 10*3/uL (ref 0.1–1.0)
Monocytes Relative: 8 %
Neutro Abs: 7.1 10*3/uL (ref 1.7–7.7)
Neutrophils Relative %: 66 %
Platelets: 426 10*3/uL — ABNORMAL HIGH (ref 150–400)
RBC: 4.68 MIL/uL (ref 3.87–5.11)
RDW: 12.6 % (ref 11.5–15.5)
WBC: 10.6 10*3/uL — ABNORMAL HIGH (ref 4.0–10.5)
nRBC: 0 % (ref 0.0–0.2)

## 2020-07-26 LAB — BASIC METABOLIC PANEL
Anion gap: 12 (ref 5–15)
BUN: 11 mg/dL (ref 6–20)
CO2: 23 mmol/L (ref 22–32)
Calcium: 9.4 mg/dL (ref 8.9–10.3)
Chloride: 104 mmol/L (ref 98–111)
Creatinine, Ser: 0.82 mg/dL (ref 0.44–1.00)
GFR, Estimated: >60 mL/min (ref >60)
Glucose, Bld: 130 mg/dL — ABNORMAL HIGH (ref 70–99)
Potassium: 3.5 mmol/L (ref 3.5–5.1)
Sodium: 139 mmol/L (ref 135–145)

## 2020-07-26 LAB — TROPONIN I (HIGH SENSITIVITY): Troponin I (High Sensitivity): <2 ng/L (ref <18)

## 2020-07-26 LAB — RESP PANEL BY RT-PCR (FLU A&B, COVID) ARPGX2
Influenza A by PCR: NEGATIVE
Influenza B by PCR: NEGATIVE
SARS Coronavirus 2 by RT PCR: NEGATIVE

## 2020-07-26 LAB — I-STAT BETA HCG BLOOD, ED (MC, WL, AP ONLY): I-stat hCG, quantitative: <5 m[IU]/mL (ref <5)

## 2020-07-26 LAB — D-DIMER, QUANTITATIVE: D-Dimer, Quant: 0.35 ug/mL-FEU (ref 0.00–0.50)

## 2020-07-26 LAB — BRAIN NATRIURETIC PEPTIDE: B Natriuretic Peptide: 22.7 pg/mL (ref 0.0–100.0)

## 2020-07-26 MED ORDER — ALBUTEROL SULFATE HFA 108 (90 BASE) MCG/ACT IN AERS
2.0000 | INHALATION_SPRAY | RESPIRATORY_TRACT | Status: DC | PRN
  Filled 2020-07-26: qty 6.7

## 2020-07-26 MED ORDER — ONDANSETRON HCL 4 MG/2ML IJ SOLN
4.0000 mg | Freq: Once | INTRAMUSCULAR | Status: AC
  Administered 2020-07-26: 4 mg via INTRAVENOUS
  Filled 2020-07-26: qty 2

## 2020-07-26 NOTE — ED Notes (Signed)
DC instructions reviewed with patient and patient verbalized understanding. VSS and patient ambulatory.

## 2020-07-26 NOTE — ED Provider Notes (Signed)
[place] EMERGENCY DEPARTMENT Provider Note   CSN: [id number] Arrival date & time: 07/26/20  0126     History Chief Complaint  Patient presents with  . Chest Pain  . Nausea    Brandy [name] is a 34 y.o. female.  Patient presents with complaints of chest pain and shortness of breath.  Patient reports that she has been sick since last week.  She started with "a sinus infection".  She saw her primary doctor and was started on a Z-Pak and prednisone.  At that time she was coughing up thick green sputum.  She reports that the cough has improved but now she is feeling like she is having more trouble getting a deep breath.  She has some associated nausea but no vomiting or diarrhea.        Past Medical History:  Diagnosis Date  . Abdominal pain, epigastric 02/18/2017  . Anterior neck pain 12/30/2015  . Anxiety   . Anxiety associated with birthing process 06/14/2018  . B12 deficiency 07/21/2016  . Chronic mixed headache syndrome 06/14/2018  . Depression   . DUB (dysfunctional uterine bleeding) 06/04/2011  . Dysphagia 07/20/2018  . Enlarged thyroid gland 12/30/2015  . Fatigue 12/30/2015  . Gallstones   . Generalized anxiety disorder 06/09/2016  . GERD (gastroesophageal reflux disease) 02/19/2011  . Headache, unspecified headache type 03/05/2018  . History of nephrolithiasis   . Kidney stone   . Medication overuse headache 06/14/2018  . Menorrhagia 11/09/2018  . Migraine   . Missed abortion 11/09/2018  . Nausea vomiting and diarrhea 12/28/2013  . Nausea without vomiting 02/18/2017  . NEPHROLITHIASIS, HX OF 12/16/2007   Qualifier: Diagnosis of  By: Sherren Mocha MD, Jory Ee   . Palpitations 03/21/2018  . Pituitary tumor   . 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 Precordial pain 03/21/2018  . Routine general medical examination at a health care facility 09/27/2014  . Serum calcium elevated 12/28/2013  . SVD (spontaneous vaginal delivery) 02/02/2018  . Tendinitis of right  ankle 08/14/2014  . 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 Vitamin D deficiency     Patient Active Problem List   Diagnosis Date Noted  . Symptomatic PVCs 04/30/2020  . Shortness of breath 04/30/2020  . Pneumonia due to COVID-19 virus 04/30/2020  . Obesity (BMI 30-39.9) 04/30/2020  . Menorrhagia 11/09/2018  . Missed abortion 11/09/2018  . Dysphagia 07/20/2018  . Anxiety associated with birthing process 06/14/2018  . 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 Chronic mixed headache syndrome 06/14/2018  . Medication overuse headache 06/14/2018  . Palpitations 03/21/2018  . Precordial pain 03/21/2018  . Headache, unspecified headache type 03/05/2018  . SVD (spontaneous vaginal delivery) 02/02/2018  . Nausea without vomiting 02/18/2017  . Abdominal pain, epigastric 02/18/2017  . Vitamin D deficiency 07/21/2016  . B12 deficiency 07/21/2016  . Generalized anxiety disorder 06/09/2016  . Anterior neck pain 12/30/2015  . Enlarged thyroid gland 12/30/2015  . Fatigue 12/30/2015  . Routine general medical examination at a health care facility 09/27/2014  . Tendinitis of right ankle 08/14/2014  . Nausea vomiting and diarrhea 12/28/2013  . Serum calcium elevated 12/28/2013  . 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 DUB (dysfunctional uterine bleeding) 06/04/2011  . GERD (gastroesophageal reflux disease) 02/19/2011  . Depression 09/06/2008  . NEPHROLITHIASIS, HX OF 12/16/2007    Past Surgical History:  Procedure Laterality Date  . CHOLECYSTECTOMY  2014  . KIDNEY STONE SURGERY     x 5  . MOUTH SURGERY    . TYMPANOSTOMY  TUBE PLACEMENT       OB History    Gravida  3   Para  1   Term  1   Preterm  0   AB  1   Living  1     SAB  1   TAB  0   Ectopic  0   Multiple  0   Live Births  1        Obstetric Comments  Age first period 79        Family History  Problem Relation Age of Onset  . Diabetes Mother   . Mental illness Mother   . Asthma Mother   . Stomach cancer Mother   .  Colon polyps Mother   . Heart disease Mother   . Hypertension Mother   . Arthritis Mother   . Diabetes Father   . Hypertension Father   . Anxiety disorder Sister   . Colon cancer Maternal Grandmother   . Diabetes Paternal Grandmother   . Breast cancer Maternal Aunt   . Lung cancer Maternal Aunt   . Diabetes Paternal Aunt   . Diabetes Maternal Uncle   . Diabetes Maternal Uncle     Social History   Tobacco Use  . Smoking status: Never Smoker  . Smokeless tobacco: Never Used  Vaping Use  . Vaping Use: Never used  Substance Use Topics  . Alcohol use: Not Currently  . Drug use: No    Home Medications Prior to Admission medications   Medication Sig Start Date End Date Taking? Authorizing Provider  acetaminophen (TYLENOL) 500 MG tablet Take 1,000 mg by mouth every 6 (six) hours as needed for mild pain or headache.     [provider]  Cholecalciferol (VITAMIN D) 2000 units CAPS Take 2,000 Units by mouth daily.    [provider]  clonazePAM (KLONOPIN) 0.5 MG tablet Take 1 tablet (0.5 mg total) by mouth 2 (two) times daily as needed for anxiety. Needs OV for more 07/01/20   Donnal Moat T, PA-C  esomeprazole (NEXIUM) 40 MG capsule TAKE 1 CAPSULE (40 MG TOTAL) BY MOUTH 2 (TWO) TIMES DAILY BEFORE A MEAL. 07/18/20   Noralyn Pick, NP  guaiFENesin (MUCINEX) 600 MG 12 hr tablet Take by mouth 2 (two) times daily as needed for cough.    [provider]  metoprolol succinate (TOPROL XL) 25 MG 24 hr tablet Take 0.5 tablets (12.5 mg total) by mouth 2 (two) times daily. 04/30/20   Tobb, Kardie, DO  ondansetron (ZOFRAN ODT) 8 MG disintegrating tablet Take 1 tablet (8 mg total) by mouth every 8 (eight) hours as needed for nausea or vomiting. 06/26/20   Noralyn Pick, NP  Probiotic Product (PROBIOTIC PO) Take 1 capsule by mouth daily.    [provider]  DULoxetine (CYMBALTA) 20 MG capsule Take 1 capsule (20 mg total) by mouth daily. Patient  not taking: Reported on 03/20/2019 01/11/19 06/21/19  Donnal Moat T, PA-C    Allergies    Metoclopramide, Penicillins, Erenumab-aooe, Fluvoxamine, Sumatriptan, and Zoloft [sertraline hcl]  Review of Systems   Review of Systems  Respiratory: Positive for chest tightness and shortness of breath.   Gastrointestinal: Positive for nausea.  All other systems reviewed and are negative.   Physical Exam Updated Vital Signs BP 112/70   Pulse 86   Temp 98.5 F (36.9 C) (Oral)   Resp (!) 22   Ht 5\' 9"  (1.753 m)   Wt 108.9 kg   SpO2  97%   BMI 35.44 kg/m   Physical Exam Vitals and nursing note reviewed.  Constitutional:      General: She is not in acute distress.    Appearance: Normal appearance. She is well-developed.  HENT:     Head: Normocephalic and atraumatic.     Right Ear: Hearing normal.     Left Ear: Hearing normal.     Nose: Nose normal.  Eyes:     Conjunctiva/sclera: Conjunctivae normal.     Pupils: Pupils are equal, round, and reactive to light.  Cardiovascular:     Rate and Rhythm: Regular rhythm.     Heart sounds: S1 normal and S2 normal. No murmur heard.  No friction rub. No gallop.   Pulmonary:     Effort: Pulmonary effort is normal. No respiratory distress.     Breath sounds: Normal breath sounds.  Chest:     Chest wall: No tenderness.  Abdominal:     General: Bowel sounds are normal.     Palpations: Abdomen is soft.     Tenderness: There is no abdominal tenderness. There is no guarding or rebound. Negative signs include Murphy's sign and McBurney's sign.     Hernia: No hernia is present.  Musculoskeletal:        General: Normal range of motion.     Cervical back: Normal range of motion and neck supple.  Skin:    General: Skin is warm and dry.     Findings: No rash.  Neurological:     Mental Status: She is alert and oriented to person, place, and time.     GCS: GCS eye subscore is 4. GCS verbal subscore is 5. GCS motor subscore is 6.     Cranial  Nerves: No cranial nerve deficit.     Sensory: No sensory deficit.     Coordination: Coordination normal.  Psychiatric:        Mood and Affect: Mood is anxious.        Speech: Speech normal.        Behavior: Behavior normal.        Thought Content: Thought content normal.     ED Results / Procedures / Treatments   Labs (all labs ordered are listed, but only abnormal results are displayed) Labs Reviewed  CBC WITH DIFFERENTIAL/PLATELET - Abnormal; Notable for the following components:      Result Value   WBC 10.6 (*)    Platelets 426 (*)    All other components within normal limits  BASIC METABOLIC PANEL - Abnormal; Notable for the following components:   Glucose, Bld 130 (*)    All other components within normal limits  RESP PANEL BY RT-PCR (FLU A&B, COVID) ARPGX2  BRAIN NATRIURETIC PEPTIDE  D-DIMER, QUANTITATIVE (NOT AT [facility])  I-STAT BETA HCG BLOOD, ED ([facility], [facility], [facility] ONLY)  TROPONIN I (HIGH SENSITIVITY)    EKG EKG Interpretation  Date/Time:  Friday July 26 2020 01:38:04 EST Ventricular Rate:  97 PR Interval:    QRS Duration: 94 QT Interval:  352 QTC Calculation: 448 R Axis:   21 Text Interpretation: Sinus rhythm Low voltage, precordial leads Borderline T abnormalities, anterior leads Confirmed by Orpah Greek [phone]) on 07/26/2020 1:44:46 AM   Radiology DG Chest Port 1 View  Result Date: 07/26/2020 CLINICAL DATA:  Chest pain EXAM: PORTABLE CHEST 1 VIEW COMPARISON:  04/08/2020 FINDINGS: The heart size and mediastinal contours are within normal limits. Both lungs are clear. The visualized skeletal structures are unremarkable. IMPRESSION: No  active disease. Electronically Signed   By: Constance Holster M.D.   On: 07/26/2020 02:02    Procedures Procedures (including critical care time)  Medications Ordered in ED Medications  ondansetron (ZOFRAN) injection 4 mg (4 mg Intravenous Given 07/26/20 0151)    ED Course  I have reviewed the triage vital signs  and the nursing notes.  Pertinent labs & imaging results that were available during my care of the patient were reviewed by me and considered in my medical decision making (see chart for details).    MDM Rules/Calculators/A&P                          Patient presents to the emergency department for evaluation of nausea, chest pain, shortness of breath.  She has been sick for at least a week.  She saw her primary doctor last week and was prescribed prednisone and Zithromax.  She has taken 4 days of these medications.  She reports that the cough has improved but tonight she feels like she is having more difficulty breathing.  She does seem mildly anxious upon arrival.  Vital signs are otherwise normal including oxygen saturation of 100% on room air, normal blood pressure, no fever and normal heart rate.  Lungs are clear bilaterally on auscultation, no wheezing.  Chest x-ray does not show any evidence of pneumonia or other pathology.  Remainder of work-up was unremarkable.  This includes a normal D-dimer, normal troponins.  Patient has no significant cardiac risk factors at this time, does not require any further cardiac work-up.  With normal D-dimer and other vital signs normal, PE is not suspected.  Final Clinical Impression(s) / ED Diagnoses Final diagnoses:  Upper respiratory tract infection, unspecified type    Rx / DC Orders ED Discharge Orders    None       Raneisha Bress, Gwenyth Allegra, MD 07/26/20 0302

## 2020-07-26 NOTE — ED Triage Notes (Signed)
Pt from home, reports chest pain, nausea and the inability to get a deep breath.  Pt was treated for a sinus infection on Friday, since then she has had chest pain and states "something is not right w/ my heart."

## 2020-07-30 ENCOUNTER — Other Ambulatory Visit: Payer: Self-pay

## 2020-07-30 DIAGNOSIS — Z87442 Personal history of urinary calculi: Secondary | ICD-10-CM | POA: Insufficient documentation

## 2020-07-30 DIAGNOSIS — F419 Anxiety disorder, unspecified: Secondary | ICD-10-CM | POA: Insufficient documentation

## 2020-07-30 DIAGNOSIS — N200 Calculus of kidney: Secondary | ICD-10-CM | POA: Insufficient documentation

## 2020-07-30 DIAGNOSIS — K8020 Calculus of gallbladder without cholecystitis without obstruction: Secondary | ICD-10-CM | POA: Insufficient documentation

## 2020-07-30 DIAGNOSIS — D497 Neoplasm of unspecified behavior of endocrine glands and other parts of nervous system: Secondary | ICD-10-CM | POA: Insufficient documentation

## 2020-08-01 ENCOUNTER — Other Ambulatory Visit: Payer: Self-pay

## 2020-08-01 ENCOUNTER — Other Ambulatory Visit: Payer: Self-pay | Admitting: Nurse Practitioner

## 2020-08-01 ENCOUNTER — Telehealth: Payer: Self-pay | Admitting: Pulmonary Disease

## 2020-08-01 ENCOUNTER — Ambulatory Visit (INDEPENDENT_AMBULATORY_CARE_PROVIDER_SITE_OTHER): Payer: 59 | Admitting: Cardiology

## 2020-08-01 ENCOUNTER — Encounter: Payer: Self-pay | Admitting: Cardiology

## 2020-08-01 VITALS — BP 110/72 | HR 80 | Ht 69.0 in | Wt 241.2 lb

## 2020-08-01 DIAGNOSIS — R079 Chest pain, unspecified: Secondary | ICD-10-CM | POA: Diagnosis not present

## 2020-08-01 DIAGNOSIS — I493 Ventricular premature depolarization: Secondary | ICD-10-CM

## 2020-08-01 DIAGNOSIS — E669 Obesity, unspecified: Secondary | ICD-10-CM | POA: Diagnosis not present

## 2020-08-01 LAB — T3: T3, Total: 137 ng/dL (ref 71–180)

## 2020-08-01 LAB — T4: T4, Total: 9.2 ug/dL (ref 4.5–12.0)

## 2020-08-01 LAB — TSH: TSH: 1.33 u[IU]/mL (ref 0.450–4.500)

## 2020-08-01 MED ORDER — FLECAINIDE ACETATE 50 MG PO TABS: 50.0000 mg | ORAL_TABLET | Freq: Two times a day (BID) | ORAL | 1 refills | Status: DC

## 2020-08-01 MED ORDER — METOPROLOL SUCCINATE ER 25 MG PO TB24
12.5000 mg | ORAL_TABLET | Freq: Every day | ORAL | 1 refills | Status: DC
Start: 2020-08-01 — End: 2020-11-01

## 2020-08-01 NOTE — Telephone Encounter (Signed)
Please update patient about allergy panel testing  Allergies to house dust mites and dog dander     Avoidance is still the best treatment for most allergies

## 2020-08-01 NOTE — Telephone Encounter (Signed)
I called and spoke with the pt and notified her of results and she verbalized understanding.

## 2020-08-01 NOTE — Patient Instructions (Signed)
Medication Instructions:  Your physician has recommended you make the following change in your medication:  DECREASE: Metoprolol succinate to 12.5 mg daily   START: Flecainide 50 mg twice daily.   *If you need a refill on your cardiac medications before your next appointment, please call your pharmacy*   Lab Work: Your physician recommends that you return for lab work today: tsh, t3, t4  If you have labs (blood work) drawn today and your tests are completely normal, you will receive your results only by: Marland Kitchen MyChart Message (if you have MyChart) OR . A paper copy in the mail If you have any lab test that is abnormal or we need to change your treatment, we will call you to review the results.   Testing/Procedures: None.   Follow-Up: At [facility], you and your health needs are our priority.  As part of our continuing mission to provide you with exceptional heart care, we have created designated Provider Care Teams.  These Care Teams include your primary Cardiologist (physician) and Advanced Practice Providers (APPs -  Physician Assistants and Nurse Practitioners) who all work together to provide you with the care you need, when you need it.  We recommend signing up for the patient portal called "MyChart".  Sign up information is provided on this After Visit Summary.  MyChart is used to connect with patients for Virtual Visits (Telemedicine).  Patients are able to view lab/test results, encounter notes, upcoming appointments, etc.  Non-urgent messages can be sent to your provider as well.   To learn more about what you can do with MyChart, go to [email or web address].    Your next appointment:   1 month(s)  The format for your next appointment:   In Person  Provider:   Berniece Salines, DO   Other Instructions  Flecainide tablets What is this medicine? FLECAINIDE (FLEK a nide) is an antiarrhythmic drug. This medicine is used to prevent irregular heart rhythm. It can also slow  down fast heartbeats called tachycardia. This medicine may be used for other purposes; ask your health care provider or pharmacist if you have questions. COMMON BRAND NAME(S): Tambocor What should I tell my health care provider before I take this medicine? They need to know if you have any of these conditions:  abnormal levels of potassium in the blood  heart disease including heart rhythm and heart rate problems  kidney or liver disease  recent heart attack  an unusual or allergic reaction to flecainide, local anesthetics, other medicines, foods, dyes, or preservatives  pregnant or trying to get pregnant  breast-feeding How should I use this medicine? Take this medicine by mouth with a glass of water. Follow the directions on the prescription label. You can take this medicine with or without food. Take your doses at regular intervals. Do not take your medicine more often than directed. Do not stop taking this medicine suddenly. This may cause serious, heart-related side effects. If your doctor wants you to stop the medicine, the dose may be slowly lowered over time to avoid any side effects. Talk to your pediatrician regarding the use of this medicine in children. While this drug may be prescribed for children as young as 1 year of age for selected conditions, precautions do apply. Overdosage: If you think you have taken too much of this medicine contact a poison control center or emergency room at once. NOTE: This medicine is only for you. Do not share this medicine with others. What if I miss a dose?  If you miss a dose, take it as soon as you can. If it is almost time for your next dose, take only that dose. Do not take double or extra doses. What may interact with this medicine? Do not take this medicine with any of the following medications:  amoxapine  arsenic trioxide  certain antibiotics like clarithromycin, erythromycin, gatifloxacin, gemifloxacin, levofloxacin,  moxifloxacin, sparfloxacin, or troleandomycin  certain antidepressants called tricyclic antidepressants like amitriptyline, imipramine, or nortriptyline  certain medicines to control heart rhythm like disopyramide, encainide, moricizine, procainamide, propafenone, and quinidine  cisapride  delavirdine  droperidol  haloperidol  hawthorn  imatinib  levomethadyl  maprotiline  medicines for malaria like chloroquine and halofantrine  pentamidine  phenothiazines like chlorpromazine, mesoridazine, prochlorperazine, thioridazine  pimozide  quinine  ranolazine  ritonavir  sertindole This medicine may also interact with the following medications:  cimetidine  dofetilide  medicines for angina or high blood pressure  medicines to control heart rhythm like amiodarone and digoxin  ziprasidone This list may not describe all possible interactions. Give your health care provider a list of all the medicines, herbs, non-prescription drugs, or dietary supplements you use. Also tell them if you smoke, drink alcohol, or use illegal drugs. Some items may interact with your medicine. What should I watch for while using this medicine? Visit your doctor or health care professional for regular checks on your progress. Because your condition and the use of this medicine carries some risk, it is a good idea to carry an identification card, necklace or bracelet with details of your condition, medications and doctor or health care professional. Check your blood pressure and pulse rate regularly. Ask your health care professional what your blood pressure and pulse rate should be, and when you should contact him or her. Your doctor or health care professional also may schedule regular blood tests and electrocardiograms to check your progress. You may get drowsy or dizzy. Do not drive, use machinery, or do anything that needs mental alertness until you know how this medicine affects you. Do not  stand or sit up quickly, especially if you are an older patient. This reduces the risk of dizzy or fainting spells. Alcohol can make you more dizzy, increase flushing and rapid heartbeats. Avoid alcoholic drinks. What side effects may I notice from receiving this medicine? Side effects that you should report to your doctor or health care professional as soon as possible:  chest pain, continued irregular heartbeats  difficulty breathing  swelling of the legs or feet  trembling, shaking  unusually weak or tired Side effects that usually do not require medical attention (report to your doctor or health care professional if they continue or are bothersome):  blurred vision  constipation  headache  nausea, vomiting  stomach pain This list may not describe all possible side effects. Call your doctor for medical advice about side effects. You may report side effects to FDA at 1-800-FDA-1088. Where should I keep my medicine? Keep out of the reach of children. Store at room temperature between 15 and 30 degrees C (59 and 86 degrees F). Protect from light. Keep container tightly closed. Throw away any unused medicine after the expiration date. NOTE: This sheet is a summary. It may not cover all possible information. If you have questions about this medicine, talk to your doctor, pharmacist, or health care provider.  2020 Elsevier/Gold Standard (2018-08-08 11:41:38)

## 2020-08-01 NOTE — Progress Notes (Signed)
Cardiology Office Note:    Date:  08/01/2020   ID:  Brandy [name], DOB [birth year], MRN [id number]  PCP:  Lujean Amel, MD  Cardiologist:  Berniece Salines, DO  Electrophysiologist:  None   Referring MD: Lujean Amel, MD   " I am doing fine"  History of Present Illness:    Brandy [name] is a 34 y.o. female with a hx of  hx of anxiety and postpartum depression. The patient was initially seen in consultation on May 31, 2018 for complaints of palpitation and shortness of breath.  During her initial consultationit was noted that the patient [facility]-up with a normal echocardiogram (July 2019) and a treadmill SPECT stress test (which was also July 2019) which I was able to review in Care Everywhere.Her Holter August 29 showed sinus tachycardia. The conclusion at that visit a 48-hour Holter monitor was placed on patient she was encouraged to continue hydration as well as continue treatment for her anxiety. Review of the Holter monitors did not show predominant sinus arrhythmia with 1 PAC. There are no ventricular ectopy or no pauses. Her average heart rate was 82 bpm.  She did continue to have palpitations therefore a longer monitor was placed on patient for 6 days which showed dominant rhythm of sinus rhythm. Isolated atrial ventricular ectopy. 12 of her triggered events were associated with sinus rhythm, one was normal sinus rhythm with PVC, one was sinus tachycardia with PVC and one was sinus rhythm with PAC.  Her Jan 03, 2019, was a telemetry medicine visit. At that timeit was discussedwith the patient to pursue noncardiac work-up.  I did see the patient on the June 22, 2019 at which time she was experiencing worsening palpitations with associated dizziness.I placed a monitor on the patient at that time and I did see her in December 2020 and discussed the result of her monitor showing symptomatic PVCs. Due to low  blood pressure as needed Lopressor 12.5 was given to the patient.  I saw the patient on November 03, 2019 at that time I started her on Toprol-XL 12.5 mg daily she reported that she was having worsening palpitations.  I saw the patient on March 31, 2020 at that time she was status post COVID-19 infection.  She did have problem with hypoxia in was treated for COVID-19 pneumonia.  During that visit she reported that she still experiencing palpitations I increased her Toprol-XL to 12.5 mg twice daily.  She is here today for follow-up visit.  She tells me today that she has been experiencing significant palpitations.  Is worsening and whenever these palpitations are occurring she feels dizzy.She is concern about this.  She has not had any syncope episode but sometimes feels that she would pass out if she does not sit down when she is experiencing these episodes. She also has chest pain at these times as well. No other complaints at this time.   Past Medical History:  Diagnosis Date  . Abdominal pain, epigastric 02/18/2017  . Anterior neck pain 12/30/2015  . Anxiety   . Anxiety associated with birthing process 06/14/2018  . B12 deficiency 07/21/2016  . Chronic mixed headache syndrome 06/14/2018  . Depression   . DUB (dysfunctional uterine bleeding) 06/04/2011  . Dysphagia 07/20/2018  . Enlarged thyroid gland 12/30/2015  . Fatigue 12/30/2015  . Gallstones   . Generalized anxiety disorder 06/09/2016  . GERD (gastroesophageal reflux disease) 02/19/2011  . Headache, unspecified headache type 03/05/2018  .  History of nephrolithiasis   . Kidney stone   . Medication overuse headache 06/14/2018  . Menorrhagia 11/09/2018  . Migraine   . Missed abortion 11/09/2018  . Nausea vomiting and diarrhea 12/28/2013  . Nausea without vomiting 02/18/2017  . NEPHROLITHIASIS, HX OF 12/16/2007   Qualifier: Diagnosis of  By: Sherren Mocha MD, Jory Ee   . Palpitations 03/21/2018  . Pituitary tumor   . Postnatal depressive disorder  06/14/2018  . Precordial pain 03/21/2018  . Routine general medical examination at a health care facility 09/27/2014  . Serum calcium elevated 12/28/2013  . SVD (spontaneous vaginal delivery) 02/02/2018  . Tendinitis of right ankle 08/14/2014  . 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 Vitamin D deficiency     Past Surgical History:  Procedure Laterality Date  . CHOLECYSTECTOMY  2014  . KIDNEY STONE SURGERY     x 5  . MOUTH SURGERY    . TYMPANOSTOMY TUBE PLACEMENT      Current Medications: No outpatient medications have been marked as taking for the 08/01/20 encounter (Office Visit) with Berniece Salines, DO.     Allergies:   Metoclopramide, Penicillins, Erenumab-aooe, Fluvoxamine, Sumatriptan, and Zoloft [sertraline hcl]   Social History   Socioeconomic History  . Marital status: Married    Spouse name: Jonathon  . Number of children: 1  . Years of education: Not on file  . Highest education level: Bachelor's degree (e.g., BA, AB, BS)  Occupational History  . Occupation: Marine scientist: [employer]  Tobacco Use  . Smoking status: Never Smoker  . Smokeless tobacco: Never Used  Vaping Use  . Vaping Use: Never used  Substance and Sexual Activity  . Alcohol use: Not Currently  . Drug use: No  . Sexual activity: Yes  Other Topics Concern  . Not on file  Social History Narrative   Patient is right-handed. She lives with her husband and child in a split-level home.    Daughter born 01/2018   She avoids caffeine.    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[employer]      Patient is now working at [employer] as [employer] 01/17/19   Social Determinants of Health   Financial Resource Strain:   . Difficulty of Paying Living Expenses: Not on file  Food Insecurity:   . Worried About Charity fundraiser in the Last Year: Not on file  . Ran Out of Food in the Last Year: Not on file  Transportation Needs:   . Lack of Transportation (Medical): Not on file  . Lack of Transportation  (Non-Medical): Not on file  Physical Activity:   . Days of Exercise per Week: Not on file  . Minutes of Exercise per Session: Not on file  Stress:   . Feeling of Stress : Not on file  Social Connections:   . Frequency of Communication with Friends and Family: Not on file  . Frequency of Social Gatherings with Friends and Family: Not on file  . Attends Religious Services: Not on file  . Active Member of Clubs or Organizations: Not on file  . Attends Archivist Meetings: Not on file  . Marital Status: Not on file     Family History: The patient's family history includes Anxiety disorder in her sister; Arthritis in her mother; Asthma in her mother; Breast cancer in her maternal aunt; Colon cancer in her maternal grandmother; Colon polyps in her mother; Diabetes in her father, maternal uncle, maternal uncle, mother,  paternal aunt, and paternal grandmother; Heart disease in her mother; Hypertension in her father and mother; Lung cancer in her maternal aunt; Mental illness in her mother; Stomach cancer in her mother.  ROS:   Review of Systems  Constitution: Negative for decreased appetite, fever and weight gain.  HENT: Negative for congestion, ear discharge, hoarse voice and sore throat.   Eyes: Negative for discharge, redness, vision loss in right eye and visual halos.  Cardiovascular: Negative for chest pain, dyspnea on exertion, leg swelling, orthopnea and palpitations.  Respiratory: Negative for cough, hemoptysis, shortness of breath and snoring.   Endocrine: Negative for heat intolerance and polyphagia.  Hematologic/Lymphatic: Negative for bleeding problem. Does not bruise/bleed easily.  Skin: Negative for flushing, nail changes, rash and suspicious lesions.  Musculoskeletal: Negative for arthritis, joint pain, muscle cramps, myalgias, neck pain and stiffness.  Gastrointestinal: Negative for abdominal pain, bowel incontinence, diarrhea and excessive appetite.  Genitourinary:  Negative for decreased libido, genital sores and incomplete emptying.  Neurological: Negative for brief paralysis, focal weakness, headaches and loss of balance.  Psychiatric/Behavioral: Negative for altered mental status, depression and suicidal ideas.  Allergic/Immunologic: Negative for HIV exposure and persistent infections.    EKGs/Labs/Other Studies Reviewed:    The following studies were reviewed today:   EKG: None today.  Recent Labs: 06/26/2020: ALT 22 07/26/2020: B Natriuretic Peptide 22.7; BUN 11; Creatinine, Ser 0.82; Hemoglobin 13.7; Platelets 426; Potassium 3.5; Sodium 139  Recent Lipid Panel    Component Value Date/Time   CHOL 196 05/31/2018 1023   TRIG 76 05/31/2018 1023   HDL 53 05/31/2018 1023   CHOLHDL 3.7 05/31/2018 1023   CHOLHDL 3 09/24/2014 0837   VLDL 9.6 09/24/2014 0837   LDLCALC 128 (H) 05/31/2018 1023    Physical Exam:    VS:  BP 110/72   Pulse 80   Ht 5\' 9"  (1.753 m)   Wt 241 lb 3.2 oz (109.4 kg)   SpO2 98%   BMI 35.62 kg/m     Wt Readings from Last 3 Encounters:  08/01/20 241 lb 3.2 oz (109.4 kg)  07/26/20 240 lb (108.9 kg)  07/23/20 240 lb 9.6 oz (109.1 kg)     GEN: Well nourished, well developed in no acute distress HEENT: Normal NECK: No JVD; No carotid bruits LYMPHATICS: No lymphadenopathy CARDIAC: S1S2 noted,RRR, no murmurs, rubs, gallops RESPIRATORY:  Clear to auscultation without rales, wheezing or rhonchi  ABDOMEN: Soft, non-tender, non-distended, +bowel sounds, no guarding. EXTREMITIES: No edema, No cyanosis, no clubbing MUSCULOSKELETAL:  No deformity  SKIN: Warm and dry NEUROLOGIC:  Alert and oriented x 3, non-focal PSYCHIATRIC:  Normal affect, good insight  ASSESSMENT:    1. Symptomatic PVCs   2. Obesity (BMI 30-39.9)   3. Chest pain of uncertain etiology    PLAN:     She has had symptomatic PVCs, the patient was on Toprol XL 12.5 mg twice a day this is not helping symptoms getting worse.  We will like to do is  try her on antiarrhythmic flecainide 50 mg twice daily and keep her on Toprol-XL once daily.  I explained the patient is important that she stay on this medication and see if her symptoms improve. We also talked about her chest pain she has had previous calcium scoring which was normal.  If this pain continue to recur a coronary CTA would be appropriate at this time. I do think that her untreated anxiety is also playing a role with her symptoms.  The patient is in  agreement with the above plan. The patient left the office in stable condition.  The patient will follow up in 1 month or sooner if needed.   Medication Adjustments/Labs and Tests Ordered: Current medicines are reviewed at length with the patient today.  Concerns regarding medicines are outlined above.  Orders Placed This Encounter  Procedures  . TSH  . T3  . T4   Meds ordered this encounter  Medications  . metoprolol succinate (TOPROL XL) 25 MG 24 hr tablet    Sig: Take 0.5 tablets (12.5 mg total) by mouth daily.    Dispense:  45 tablet    Refill:  1  . flecainide (TAMBOCOR) 50 MG tablet    Sig: Take 1 tablet (50 mg total) by mouth 2 (two) times daily.    Dispense:  180 tablet    Refill:  1    Patient Instructions  Medication Instructions:  Your physician has recommended you make the following change in your medication:  DECREASE: Metoprolol succinate to 12.5 mg daily   START: Flecainide 50 mg twice daily.   *If you need a refill on your cardiac medications before your next appointment, please call your pharmacy*   Lab Work: Your physician recommends that you return for lab work today: tsh, t3, t4  If you have labs (blood work) drawn today and your tests are completely normal, you will receive your results only by: Marland Kitchen MyChart Message (if you have MyChart) OR . A paper copy in the mail If you have any lab test that is abnormal or we need to change your treatment, we will call you to review the  results.   Testing/Procedures: None.   Follow-Up: At [facility], you and your health needs are our priority.  As part of our continuing mission to provide you with exceptional heart care, we have created designated Provider Care Teams.  These Care Teams include your primary Cardiologist (physician) and Advanced Practice Providers (APPs -  Physician Assistants and Nurse Practitioners) who all work together to provide you with the care you need, when you need it.  We recommend signing up for the patient portal called "MyChart".  Sign up information is provided on this After Visit Summary.  MyChart is used to connect with patients for Virtual Visits (Telemedicine).  Patients are able to view lab/test results, encounter notes, upcoming appointments, etc.  Non-urgent messages can be sent to your provider as well.   To learn more about what you can do with MyChart, go to [email or web address].    Your next appointment:   1 month(s)  The format for your next appointment:   In Person  Provider:   Berniece Salines, DO   Other Instructions  Flecainide tablets What is this medicine? FLECAINIDE (FLEK a nide) is an antiarrhythmic drug. This medicine is used to prevent irregular heart rhythm. It can also slow down fast heartbeats called tachycardia. This medicine may be used for other purposes; ask your health care provider or pharmacist if you have questions. COMMON BRAND NAME(S): Tambocor What should I tell my health care provider before I take this medicine? They need to know if you have any of these conditions:  abnormal levels of potassium in the blood  heart disease including heart rhythm and heart rate problems  kidney or liver disease  recent heart attack  an unusual or allergic reaction to flecainide, local anesthetics, other medicines, foods, dyes, or preservatives  pregnant or trying to get pregnant  breast-feeding How should I use this  medicine? Take this medicine by  mouth with a glass of water. Follow the directions on the prescription label. You can take this medicine with or without food. Take your doses at regular intervals. Do not take your medicine more often than directed. Do not stop taking this medicine suddenly. This may cause serious, heart-related side effects. If your doctor wants you to stop the medicine, the dose may be slowly lowered over time to avoid any side effects. Talk to your pediatrician regarding the use of this medicine in children. While this drug may be prescribed for children as young as 1 year of age for selected conditions, precautions do apply. Overdosage: If you think you have taken too much of this medicine contact a poison control center or emergency room at once. NOTE: This medicine is only for you. Do not share this medicine with others. What if I miss a dose? If you miss a dose, take it as soon as you can. If it is almost time for your next dose, take only that dose. Do not take double or extra doses. What may interact with this medicine? Do not take this medicine with any of the following medications:  amoxapine  arsenic trioxide  certain antibiotics like clarithromycin, erythromycin, gatifloxacin, gemifloxacin, levofloxacin, moxifloxacin, sparfloxacin, or troleandomycin  certain antidepressants called tricyclic antidepressants like amitriptyline, imipramine, or nortriptyline  certain medicines to control heart rhythm like disopyramide, encainide, moricizine, procainamide, propafenone, and quinidine  cisapride  delavirdine  droperidol  haloperidol  hawthorn  imatinib  levomethadyl  maprotiline  medicines for malaria like chloroquine and halofantrine  pentamidine  phenothiazines like chlorpromazine, mesoridazine, prochlorperazine, thioridazine  pimozide  quinine  ranolazine  ritonavir  sertindole This medicine may also interact with the following  medications:  cimetidine  dofetilide  medicines for angina or high blood pressure  medicines to control heart rhythm like amiodarone and digoxin  ziprasidone This list may not describe all possible interactions. Give your health care provider a list of all the medicines, herbs, non-prescription drugs, or dietary supplements you use. Also tell them if you smoke, drink alcohol, or use illegal drugs. Some items may interact with your medicine. What should I watch for while using this medicine? Visit your doctor or health care professional for regular checks on your progress. Because your condition and the use of this medicine carries some risk, it is a good idea to carry an identification card, necklace or bracelet with details of your condition, medications and doctor or health care professional. Check your blood pressure and pulse rate regularly. Ask your health care professional what your blood pressure and pulse rate should be, and when you should contact him or her. Your doctor or health care professional also may schedule regular blood tests and electrocardiograms to check your progress. You may get drowsy or dizzy. Do not drive, use machinery, or do anything that needs mental alertness until you know how this medicine affects you. Do not stand or sit up quickly, especially if you are an older patient. This reduces the risk of dizzy or fainting spells. Alcohol can make you more dizzy, increase flushing and rapid heartbeats. Avoid alcoholic drinks. What side effects may I notice from receiving this medicine? Side effects that you should report to your doctor or health care professional as soon as possible:  chest pain, continued irregular heartbeats  difficulty breathing  swelling of the legs or feet  trembling, shaking  unusually weak or tired Side effects that usually do not require medical attention (  report to your doctor or health care professional if they continue or are  bothersome):  blurred vision  constipation  headache  nausea, vomiting  stomach pain This list may not describe all possible side effects. Call your doctor for medical advice about side effects. You may report side effects to FDA at 1-800-FDA-1088. Where should I keep my medicine? Keep out of the reach of children. Store at room temperature between 15 and 30 degrees C (59 and 86 degrees F). Protect from light. Keep container tightly closed. Throw away any unused medicine after the expiration date. NOTE: This sheet is a summary. It may not cover all possible information. If you have questions about this medicine, talk to your doctor, pharmacist, or health care provider.  2020 Elsevier/Gold Standard (2018-08-08 11:41:38)      Adopting a Healthy Lifestyle.  Know what a healthy weight is for you (roughly BMI <25) and aim to maintain this   Aim for 7+ servings of fruits and vegetables daily   65-80+ fluid ounces of water or unsweet tea for healthy kidneys   Limit to max 1 drink of alcohol per day; avoid smoking/tobacco   Limit animal fats in diet for cholesterol and heart health - choose grass fed whenever available   Avoid highly processed foods, and foods high in saturated/trans fats   Aim for low stress - take time to unwind and care for your mental health   Aim for 150 min of moderate intensity exercise weekly for heart health, and weights twice weekly for bone health   Aim for 7-9 hours of sleep daily   When it comes to diets, agreement about the perfect plan isnt easy to find, even among the experts. Experts at the [employer] developed an idea known as the Healthy Eating Plate. Just imagine a plate divided into logical, healthy portions.   The emphasis is on diet quality:   Load up on vegetables and fruits - one-half of your plate: Aim for color and variety, and remember that potatoes dont count.   Go for whole grains - one-quarter of your  plate: Whole wheat, barley, wheat berries, quinoa, oats, brown rice, and foods made with them. If you want pasta, go with whole wheat pasta.   Protein power - one-quarter of your plate: Fish, chicken, beans, and nuts are all healthy, versatile protein sources. Limit red meat.   The diet, however, does go beyond the plate, offering a few other suggestions.   Use healthy plant oils, such as olive, canola, soy, corn, sunflower and peanut. Check the labels, and avoid partially hydrogenated oil, which have unhealthy trans fats.   If youre thirsty, drink water. Coffee and tea are good in moderation, but skip sugary drinks and limit milk and dairy products to one or two daily servings.   The type of carbohydrate in the diet is more important than the amount. Some sources of carbohydrates, such as vegetables, fruits, whole grains, and beans-are healthier than others.   Finally, stay active  Signed, Berniece Salines, DO  08/01/2020 8:51 AM    [facility]

## 2020-08-22 ENCOUNTER — Other Ambulatory Visit: Payer: Self-pay

## 2020-08-22 ENCOUNTER — Ambulatory Visit (INDEPENDENT_AMBULATORY_CARE_PROVIDER_SITE_OTHER): Payer: 59 | Admitting: Neurology

## 2020-08-22 ENCOUNTER — Encounter: Payer: Self-pay | Admitting: Neurology

## 2020-08-22 VITALS — BP 122/77 | HR 97 | Ht 69.0 in | Wt 241.6 lb

## 2020-08-22 DIAGNOSIS — R42 Dizziness and giddiness: Secondary | ICD-10-CM | POA: Diagnosis not present

## 2020-08-22 DIAGNOSIS — R413 Other amnesia: Secondary | ICD-10-CM | POA: Diagnosis not present

## 2020-08-22 DIAGNOSIS — R2681 Unsteadiness on feet: Secondary | ICD-10-CM | POA: Diagnosis not present

## 2020-08-22 NOTE — Progress Notes (Signed)
NEUROLOGY FOLLOW UP OFFICE NOTE  Brandy [name] [id number]   Subjective:  Brandy [name] is a 34 year old right-handed woman with depression, anxiety and history of kidney stones who follows up for memory loss, balance problems.  UPDATE: Last seen in May 2020.  She had COVID at the end of July with double pneumonia.  She recovered and was doing well until October when she got up and started to feel dizzy.  She has felt dizzy since.  It is a feeling of off-balance, not really spinning or lightheadedness.  She has prior history of dizziness and symptomatic PVCs.  She also started dropping objects.  Last month, she had a sinus infection and was prescribed Zpak and prednisone but had a side effect.  About 2 weeks ago, she developed fairly sudden onset short-term memory loss.  She quickly forgets conversations and word-finding difficulty.  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  Thyroid panel was normal.  Still reports muscle twitches.  Also reports pain along her spine between her shoulder blades.   Current NSAIDS:no Current analgesics:no Current triptans:no Current ergotamine:no Current anti-emetic:none Current muscle relaxants:no Current anti-anxiolytic:clonazepam PRN Current sleep aide:no Current Antihypertensive medications:none Current Antidepressant medications:no Current Anticonvulsant medications:no Current anti-CGRP:none Current Vitamins/Herbal/Supplements:no Current Antihistamines/Decongestants:no Other therapy:no Other medication:no     HISTORY: I  Migraines: Onset:April 2019, during first trimester of first pregnancy. 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 Eye exam was normal. Labs/hormones were unremarkable. Repeat imaging since birth of her child has shown that it has decreased in size. MRA of head and  neck from 01/05/18 personally reviewed and was negative. MRI of brain with and without contrast from 03/07/18 was personally reviewed and demonstrated known pituitary adenoma but otherwise no acute process. MRV of head performed the same day was negative for dural sinus thrombosis. MRI of brain with and without contrast from 06/17/18 showed no acute findings associated with intracranial hypotension. 85mm pituitary gland stable and stated to be within normal limits for age and gender. MRI of cervical spine with and without contrast showed no abnormal fluid collection or enhancement. Therefore, CSF leak was no longer suspected and she did not receive a blood patch. Location:Left sided, left retro-orbital/temporal/occipital, but now bilateral Quality:Pressure, "weird sensations throughout head" Intensity:Severe.Shedenies thunderclap headache. Aura:no Prodrome:no Postdrome:no Associated symptoms:Nausea, vomiting, bilateral eye lacrimation.Shedenies associated, photophobia, phonophobia,unilateral numbness or weakness. Duration:Often wakes up with it. Off and on throughout the day. Frequency:daily Frequency of abortive medication:none Triggers:no Exacerbating factors:Standing up, exertion Relieving factors:Laying down Activity:aggravates  II Myalgias/Muscle twitching: She developed myalgias after the first dose of Aimovig in August 2019.  She never continued the Aimovig after the first dose and symptoms persist.ANA, CRP, TSH, B12, K+, CRP, CK and aldolase were unremarkable.  However, she started experiencing muscle twitches on and off for 2-3 months.  No cramps.  It involves the face, back, arms, legs and mostly in thighs.  She also reports increased weakness in the arms and legs.  Pain is worse, described as an aching but not cramping.  Any activity aggravates it.  She feels fatigue in general.  Denies difficulty speaking, swallowing, breathing or change in bowel or  bladder control.  She is sleeping well since starting Remeron.  She reports anxiety due to her symptoms.  Repeat labs from May 2020 showed normal CMP, CK 42, and normal Mg of 2.  NCV-EMG of right  upper and lower extremities on 03/30/2019 was normal without evidence of myopathy or other abnormality.  No family history of ALS.    Past NSAIDS:Ibuprofen, naproxen, Cambia Past analgesics:Fioricet (ineffective), Excedrin Migraine, Tylenol, Ultracet Past abortive triptans:Sumatriptan tablet (made her feel awful, palpitations) Past abortive ergotamine:no Past muscle relaxants:baclofen, Flexeril Past anti-emetic:promethazine, Zofran Past antihypertensive medications:metoprolol Past antidepressant medications:Cymbalta, Effexor, fluoxetine, sertraline, Wellbutrin, citalopram, Lexapro, mirtazapine Past anticonvulsant medications:lamogrigine Past anti-CGRP:Aimovig 70mg  (reported various somatic complaints following first dose such as nausea, constipation, arthralgias,myalgias, pain,hair loss and fatigue. She never continued the Aimovig after the first dose and symptoms persist. Past vitamins/Herbal/Supplements:no Past antihistamines/decongestants:Benadryl Other past therapies:none  Family history of headache:no  PAST MEDICAL HISTORY: Past Medical History:  Diagnosis Date  . Abdominal pain, epigastric 02/18/2017  . Anterior neck pain 12/30/2015  . Anxiety   . Anxiety associated with birthing process 06/14/2018  . B12 deficiency 07/21/2016  . Chronic mixed headache syndrome 06/14/2018  . Depression   . DUB (dysfunctional uterine bleeding) 06/04/2011  . Dysphagia 07/20/2018  . Enlarged thyroid gland 12/30/2015  . Fatigue 12/30/2015  . Gallstones   . Generalized anxiety disorder 06/09/2016  . GERD (gastroesophageal reflux disease) 02/19/2011  . Headache, unspecified headache type 03/05/2018  . History of nephrolithiasis   . Kidney stone   . Medication overuse headache  06/14/2018  . Menorrhagia 11/09/2018  . Migraine   . Missed abortion 11/09/2018  . Nausea vomiting and diarrhea 12/28/2013  . Nausea without vomiting 02/18/2017  . NEPHROLITHIASIS, HX OF 12/16/2007   Qualifier: Diagnosis of  By: Sherren Mocha MD, Jory Ee   . Palpitations 03/21/2018  . Pituitary tumor   . 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 Precordial pain 03/21/2018  . Routine general medical examination at a health care facility 09/27/2014  . Serum calcium elevated 12/28/2013  . SVD (spontaneous vaginal delivery) 02/02/2018  . Tendinitis of right ankle 08/14/2014  . 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 Vitamin D deficiency     MEDICATIONS: Current Outpatient Medications on File Prior to Visit  Medication Sig Dispense Refill  . acetaminophen (TYLENOL) 500 MG tablet Take 1,000 mg by mouth every 6 (six) hours as needed for mild pain or headache.     Marland Kitchen azithromycin (ZITHROMAX) 250 MG tablet Take 250 mg by mouth as directed. (Patient not taking: Reported on 08/01/2020)    . Cholecalciferol (VITAMIN D) 2000 units CAPS Take 2,000 Units by mouth daily.    . ciprofloxacin (CIPRO) 500 MG tablet Take 500 mg by mouth 2 (two) times daily. (Patient not taking: Reported on 08/01/2020)    . clonazePAM (KLONOPIN) 0.5 MG tablet Take 1 tablet (0.5 mg total) by mouth 2 (two) times daily as needed for anxiety. Needs OV for more 60 tablet 1  . doxycycline (VIBRA-TABS) 100 MG tablet Take 100 mg by mouth 2 (two) times daily. (Patient not taking: Reported on 08/01/2020)    . esomeprazole (NEXIUM) 40 MG capsule TAKE 1 CAPSULE (40 MG TOTAL) BY MOUTH 2 (TWO) TIMES DAILY BEFORE A MEAL. 180 capsule 1  . flecainide (TAMBOCOR) 50 MG tablet Take 1 tablet (50 mg total) by mouth 2 (two) times daily. 180 tablet 1  . guaiFENesin (MUCINEX) 600 MG 12 hr tablet Take by mouth 2 (two) times daily as needed for cough.    . metoprolol succinate (TOPROL XL) 25 MG 24 hr tablet Take 0.5 tablets (12.5 mg total) by mouth daily. 45 tablet 1  . ondansetron  (ZOFRAN ODT) 8 MG disintegrating tablet Take 1 tablet (8 mg total)  by mouth every 8 (eight) hours as needed for nausea or vomiting. 20 tablet 0  . predniSONE (DELTASONE) 20 MG tablet Take 20 mg by mouth 2 (two) times daily. (Patient not taking: Reported on 08/01/2020)    . Probiotic Product (PROBIOTIC PO) Take 1 capsule by mouth daily.    . [DISCONTINUED] DULoxetine (CYMBALTA) 20 MG capsule Take 1 capsule (20 mg total) by mouth daily. (Patient not taking: Reported on 03/20/2019) 30 capsule 1   No current facility-administered medications on file prior to visit.    ALLERGIES: Allergies  Allergen Reactions  . Metoclopramide Palpitations    Rapid heart rate  . Penicillins Hives, Rash and Other (See Comments)    Childhood reaction Has patient had a PCN reaction causing immediate rash, facial/tongue/throat swelling, SOB or lightheadedness with hypotension: No Has patient had a PCN reaction causing severe rash involving mucus membranes or skin necrosis: No Has patient had a PCN reaction that required hospitalization No Has patient had a PCN reaction occurring within the last 10 years: No If all of the above answers are "NO", then may proceed with Cephalosporin use.  Eduard Roux Other (See Comments)    Other reaction(s): Myalgias (intolerance) Constipation, hair loss  . Fluvoxamine Other (See Comments)    Other reaction(s): Fainted  . Sumatriptan Palpitations  . Zoloft [Sertraline Hcl] Anxiety    FAMILY HISTORY: Family History  Problem Relation Age of Onset  . Diabetes Mother   . Mental illness Mother   . Asthma Mother   . Stomach cancer Mother   . Colon polyps Mother   . Heart disease Mother   . Hypertension Mother   . Arthritis Mother   . Diabetes Father   . Hypertension Father   . Anxiety disorder Sister   . Colon cancer Maternal Grandmother   . Diabetes Paternal Grandmother   . Breast cancer Maternal Aunt   . Lung cancer Maternal Aunt   . Diabetes Paternal Aunt   .  Diabetes Maternal Uncle   . Diabetes Maternal Uncle    SOCIAL HISTORY: Social History   Socioeconomic History  . Marital status: Married    Spouse name: Jonathon  . Number of children: 1  . Years of education: Not on file  . Highest education level: Bachelor's degree (e.g., BA, AB, BS)  Occupational History  . Occupation: Marine scientist: [id number]  Tobacco Use  . Smoking status: Never Smoker  . Smokeless tobacco: Never Used  Vaping Use  . Vaping Use: Never used  Substance and Sexual Activity  . Alcohol use: Not Currently  . Drug use: No  . Sexual activity: Yes  Other Topics Concern  . Not on file  Social History Narrative   Patient is right-handed. She lives with her husband and child in a split-level home.    Daughter born 01/2018   She avoids caffeine.    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 General: No  acute distress.  Patient appears well-groomed.   Head:  Normocephalic/atraumatic Eyes:  Fundi examined but not visualized Neck: supple, no paraspinal tenderness, full range of motion Heart:  Regular rate and rhythm Lungs:  Clear to auscultation bilaterally Back: No paraspinal tenderness Neurological Exam:  [employer] Mental Exam 08/22/2020  Weekday Correct 1  Current year 1  What state are we in? 1  Amount spent 1  Amount left 2  # of Animals 2  5 objects recall 5  Number series 1  Hour markers 2  Time correct 0  Placed X in triangle correctly 1  Largest Figure 1  Name of female  0  Date back to work 0  Type of work 2  State she lived in 0  Total score 20   CN II-XII intact. Bulk and tone normal, muscle strength 5/5 throughout.  Sensation to light touch, temperature and vibration intact.  Deep tendon reflexes 2+ throughout, toes downgoing.  Finger to nose and heel to shin testing intact.  Gait normal, Romberg negative.   Assessment/Plan:   1.  Sudden onset memory loss, dizziness, unsteady gait, dropping objects.    1.  MRI of brain with and without contrast 2.  B12 level 3.  Follow up after testing.  Further recommendations pending results.  [name], DO  CC: Lujean Amel, MD

## 2020-08-22 NOTE — Patient Instructions (Addendum)
1.  MRI of brain with and without contrast. We have sent a referral to [facility] for your MRI and they will call you directly to schedule your appointment. They are located at [place]. If you need to contact them directly please call [phone].  2.  B12 level 3.  Follow up after testing.  Further recommendations pending results.

## 2020-08-26 ENCOUNTER — Ambulatory Visit
Admission: RE | Admit: 2020-08-26 | Discharge: 2020-08-26 | Disposition: A | Discharge status: Home / Self Care | Payer: 59 | Source: Ambulatory Visit | Attending: Neurology | Admitting: Neurology

## 2020-08-26 ENCOUNTER — Other Ambulatory Visit: Payer: Self-pay

## 2020-08-26 DIAGNOSIS — R413 Other amnesia: Secondary | ICD-10-CM

## 2020-08-26 DIAGNOSIS — R42 Dizziness and giddiness: Secondary | ICD-10-CM

## 2020-08-26 DIAGNOSIS — R2681 Unsteadiness on feet: Secondary | ICD-10-CM

## 2020-08-26 MED ORDER — GADOBENATE DIMEGLUMINE 529 MG/ML IV SOLN
20.0000 mL | Freq: Once | INTRAVENOUS | Status: AC | PRN
  Administered 2020-08-26: 20 mL via INTRAVENOUS

## 2020-08-28 ENCOUNTER — Telehealth: Payer: Self-pay | Admitting: Neurology

## 2020-08-28 NOTE — Telephone Encounter (Signed)
Patient states Brandy [name] is still experiencing a lot of dizziness and was wondering if there was something Dr Everlena Cooper could prescribe for her? Brandy [name] uses [facility] in [place]. Please call

## 2020-08-29 NOTE — Telephone Encounter (Signed)
I defer to her PCP because I have no neurologic explanation for her dizziness

## 2020-08-29 NOTE — Telephone Encounter (Signed)
Pt advised of Dr.Jaffe note below.  

## 2020-09-03 ENCOUNTER — Other Ambulatory Visit: Payer: Self-pay

## 2020-09-03 DIAGNOSIS — R253 Fasciculation: Secondary | ICD-10-CM | POA: Insufficient documentation

## 2020-09-03 DIAGNOSIS — Z86018 Personal history of other benign neoplasm: Secondary | ICD-10-CM | POA: Insufficient documentation

## 2020-09-04 ENCOUNTER — Ambulatory Visit: Payer: 59 | Admitting: Neurology

## 2020-09-05 ENCOUNTER — Ambulatory Visit: Payer: 59 | Admitting: Cardiology

## 2020-09-06 ENCOUNTER — Ambulatory Visit (AMBULATORY_SURGERY_CENTER): Payer: 59 | Admitting: *Deleted

## 2020-09-06 ENCOUNTER — Other Ambulatory Visit: Payer: Self-pay

## 2020-09-06 VITALS — Ht 69.0 in | Wt 240.0 lb

## 2020-09-06 DIAGNOSIS — Z01818 Encounter for other preprocedural examination: Secondary | ICD-10-CM

## 2020-09-06 DIAGNOSIS — K219 Gastro-esophageal reflux disease without esophagitis: Secondary | ICD-10-CM

## 2020-09-06 DIAGNOSIS — R1013 Epigastric pain: Secondary | ICD-10-CM

## 2020-09-06 DIAGNOSIS — R110 Nausea: Secondary | ICD-10-CM

## 2020-09-06 NOTE — Progress Notes (Signed)
Pt verified name, DOB, address and insurance during PV today. Pt mailed instruction packet to included paper to complete and mail back to [facility] with addressed and stamped envelope, Emmi video, copy of consent form to read and not return, and instructions- also sent instruction packet in My Chart .  PV completed over the phone. Pt encouraged to call with questions or issues  No egg or soy allergy known to patient  No issues with past sedation with any surgeries or procedures No intubation problems in the past  No FH of Malignant Hyperthermia No diet pills per patient No home 02 use per patient  No blood thinners per patient  Pt denies issues with constipation  No A fib or A flutter  EMMI video to pt or via MyChart  COVID 19 guidelines implemented in PV today with Pt and RN   Pt is not  fully vaccinated  for Covid - will test 1-13 Thursday 1 pm    Due to the COVID-19 pandemic we are asking patients to follow certain guidelines.  Pt aware of COVID protocols and [facility] guidelines

## 2020-09-16 ENCOUNTER — Encounter: Payer: 59 | Admitting: Internal Medicine

## 2020-09-26 ENCOUNTER — Other Ambulatory Visit: Payer: Self-pay | Admitting: Physician Assistant

## 2020-09-26 ENCOUNTER — Telehealth: Payer: Self-pay | Admitting: Physician Assistant

## 2020-09-26 MED ORDER — CLONAZEPAM 0.5 MG PO TABS: 0.5000 mg | ORAL_TABLET | Freq: Two times a day (BID) | ORAL | 3 refills | Status: DC | PRN

## 2020-09-26 NOTE — Telephone Encounter (Signed)
Pt requesting refill for Clonazepam 0.5 mg 2/d prn. Apt 5/4 [facility] [place]

## 2020-09-26 NOTE — Telephone Encounter (Signed)
Prescription was sent

## 2020-10-01 DIAGNOSIS — I493 Ventricular premature depolarization: Secondary | ICD-10-CM | POA: Insufficient documentation

## 2020-10-01 DIAGNOSIS — T7840XA Allergy, unspecified, initial encounter: Secondary | ICD-10-CM | POA: Insufficient documentation

## 2020-10-02 ENCOUNTER — Encounter: Payer: Self-pay | Admitting: Cardiology

## 2020-10-02 ENCOUNTER — Ambulatory Visit (INDEPENDENT_AMBULATORY_CARE_PROVIDER_SITE_OTHER): Payer: 59 | Admitting: Cardiology

## 2020-10-02 ENCOUNTER — Other Ambulatory Visit: Payer: Self-pay

## 2020-10-02 VITALS — BP 118/78 | HR 78 | Ht 69.0 in | Wt 243.4 lb

## 2020-10-02 DIAGNOSIS — E669 Obesity, unspecified: Secondary | ICD-10-CM

## 2020-10-02 DIAGNOSIS — R0602 Shortness of breath: Secondary | ICD-10-CM

## 2020-10-02 DIAGNOSIS — I493 Ventricular premature depolarization: Secondary | ICD-10-CM | POA: Diagnosis not present

## 2020-10-02 DIAGNOSIS — F419 Anxiety disorder, unspecified: Secondary | ICD-10-CM | POA: Diagnosis not present

## 2020-10-02 MED ORDER — PROPRANOLOL HCL 20 MG PO TABS: 20.0000 mg | ORAL_TABLET | Freq: Three times a day (TID) | ORAL | 3 refills | Status: DC

## 2020-10-02 NOTE — Patient Instructions (Addendum)
Medication Instructions:  Your physician has recommended you make the following change in your medication:   START: PROPANOLOL EVERY 8 HOURS  STOP: TOPROL, flecainide (TAMBOCOR) 50 MG tablet *If you need a refill on your cardiac medications before your next appointment, please call your pharmacy*   Lab Work: NONE If you have labs (blood work) drawn today and your tests are completely normal, you will receive your results only by: Marland Kitchen MyChart Message (if you have MyChart) OR . A paper copy in the mail If you have any lab test that is abnormal or we need to change your treatment, we will call you to review the results.   Testing/Procedures: Your physician has requested that you have an echocardiogram. Echocardiography is a painless test that uses sound waves to create images of your heart. It provides your doctor with information about the size and shape of your heart and how well your heart's chambers and valves are working. This procedure takes approximately one hour. There are no restrictions for this procedure.  Your physician has recommended that you have a sleep study. This test records several body functions during sleep, including: brain activity, eye movement, oxygen and carbon dioxide blood levels, heart rate and rhythm, breathing rate and rhythm, the flow of air through your mouth and nose, snoring, body muscle movements, and chest and belly movement.   Follow-Up: At [facility], you and your health needs are our priority.  As part of our continuing mission to provide you with exceptional heart care, we have created designated Provider Care Teams.  These Care Teams include your primary Cardiologist (physician) and Advanced Practice Providers (APPs -  Physician Assistants and Nurse Practitioners) who all work together to provide you with the care you need, when you need it.  We recommend signing up for the patient portal called "MyChart".  Sign up information is provided on this  After Visit Summary.  MyChart is used to connect with patients for Virtual Visits (Telemedicine).  Patients are able to view lab/test results, encounter notes, upcoming appointments, etc.  Non-urgent messages can be sent to your provider as well.   To learn more about what you can do with MyChart, go to [email or web address].    Your next appointment:   4 week(s)  The format for your next appointment:   In Person  Provider:   Berniece Salines, DO   Other Instructions

## 2020-10-04 DIAGNOSIS — I493 Ventricular premature depolarization: Secondary | ICD-10-CM | POA: Insufficient documentation

## 2020-10-04 DIAGNOSIS — E669 Obesity, unspecified: Secondary | ICD-10-CM | POA: Insufficient documentation

## 2020-10-04 NOTE — Progress Notes (Addendum)
Cardiology Office Note:    Date:  10/04/2020   ID:  Brandy [name], DOB 08/08/1986, MRN [id number]  PCP:  Brandy Amel, MD  Cardiologist:  Brandy Salines, DO  Electrophysiologist:  None   Referring MD: Brandy Amel, MD   " I am still having palpitations I have not been taking my medicines"  History of Present Illness:    Brandy [name] is a 35 y.o. female with a hx of anxiety and postpartum depression. The patient was initially seen in consultation on May 31, 2018 for complaints of palpitation and shortness of breath.  During her initial consultationit was noted that the [name]up with a normal echocardiogram (July 2019) and a treadmill SPECT stress test (which was also July 2019) which I was able to review in Care Everywhere.Her Holter August 29 showed sinus tachycardia. The conclusion at that visit a 48-hour Holter monitor was placed on patient she was encouraged to continue hydration as well as continue treatment for her anxiety. Review of the Holter monitors did not show predominant sinus arrhythmia with 1 PAC. There are no ventricular ectopy or no pauses. Her average heart rate was 82 bpm.  She did continue to have palpitations therefore a longer monitor was placed on patient for 6 days which showed dominant rhythm of sinus rhythm. Isolated atrial ventricular ectopy. 12 of her triggered events were associated with sinus rhythm, one was normal sinus rhythm with PVC, one was sinus tachycardia with PVC and one was sinus rhythm with PAC.  Her Jan 03, 2019, was a telemetry medicine visit. At that timeit was discussedwith the patient to pursue noncardiac work-up.  I did see the patient on the June 22, 2019 at which time she was experiencing worsening palpitations with associated dizziness.I placed a monitor on the patient at that time and I did see her in December 2020 and discussed the result of her  monitor showing symptomatic PVCs. Due to low blood pressure as needed Lopressor 12.5 was given to the patient.  I saw the patient on November 03, 2019 at that time I started her on Toprol-XL 12.5 mg daily she reported that she was having worsening palpitations.  I saw the patient on March 31, 2020 at that time she was status post COVID-19 infection.  She did have problem with hypoxia in was treated for COVID-19 pneumonia.  During that visit she reported that she still experiencing palpitations I increased her Toprol-XL to 12.5 mg twice daily.  She is here today for follow-up visit.  At her last visit due to symptomatic PVCs and the patient reporting that the Toprol XL is not working I added flecainide 50 milligrams twice daily to her regimen.  We also talked about her CT scanning which showed a normal coronary CTA with 0 calcium.  At that time I discussed with the patient advising her that untreated anxiety may be playing a significant role in her symptoms.  She plans to see psychiatry and have medication adjustments.  The patient is here today for follow-up visit.  Unfortunately she had not been taking any of her medications.  She had also stopped the medication that psychiatry had started for her.  She tells me the symptoms are worsening the palpitations are there.  She also notes that her since having cold at her baseline heart rate has been in the low 110s.  Past Medical History:  Diagnosis Date  . Abdominal pain, epigastric 02/18/2017  . Allergy   .  Anterior neck pain 12/30/2015  . Anxiety   . Anxiety associated with birthing process 06/14/2018  . B12 deficiency 07/21/2016  . Chronic mixed headache syndrome 06/14/2018  . COVID-19 virus infection 03/2020   with infusion   . Depression   . DUB (dysfunctional uterine bleeding) 06/04/2011  . Dysphagia 07/20/2018  . Enlarged thyroid gland 12/30/2015  . Fatigue 12/30/2015  . Gallstones   . Generalized anxiety disorder 06/09/2016  . GERD  (gastroesophageal reflux disease) 02/19/2011  . Headache, unspecified headache type 03/05/2018  . History of nephrolithiasis   . Kidney stone   . Medication overuse headache 06/14/2018  . Menorrhagia 11/09/2018  . Migraine   . Missed abortion 11/09/2018  . Nausea vomiting and diarrhea 12/28/2013  . Nausea without vomiting 02/18/2017  . NEPHROLITHIASIS, HX OF 12/16/2007   Qualifier: Diagnosis of  By: Sherren Mocha MD, Jory Ee   . Palpitations 03/21/2018  . Pituitary tumor   . 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 Precordial pain 03/21/2018  . PVC's (premature ventricular contractions)   . Routine general medical examination at a health care facility 09/27/2014  . Serum calcium elevated 12/28/2013  . SVD (spontaneous vaginal delivery) 02/02/2018  . Tendinitis of right ankle 08/14/2014  . 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 Vitamin D deficiency     Past Surgical History:  Procedure Laterality Date  . CHOLECYSTECTOMY  2014  . KIDNEY STONE SURGERY     x 5  . TYMPANOSTOMY TUBE PLACEMENT    . WISDOM TOOTH EXTRACTION      Current Medications: Current Meds  Medication Sig  . acetaminophen (TYLENOL) 500 MG tablet Take 1,000 mg by mouth every 6 (six) hours as needed for mild pain or headache.   . Cholecalciferol (VITAMIN D) 2000 units CAPS Take 2,000 Units by mouth daily.  . clonazePAM (KLONOPIN) 0.5 MG tablet Take 1 tablet (0.5 mg total) by mouth 2 (two) times daily as needed for anxiety.  Marland Kitchen esomeprazole (NEXIUM) 40 MG capsule TAKE 1 CAPSULE (40 MG TOTAL) BY MOUTH 2 (TWO) TIMES DAILY BEFORE A MEAL.  . flecainide (TAMBOCOR) 50 MG tablet Take 1 tablet (50 mg total) by mouth 2 (two) times daily.  Marland Kitchen guaiFENesin (MUCINEX) 600 MG 12 hr tablet Take by mouth 2 (two) times daily as needed for cough.  . metoprolol succinate (TOPROL XL) 25 MG 24 hr tablet Take 0.5 tablets (12.5 mg total) by mouth daily.  . Omega-3 Fatty Acids (FISH OIL) 1000 MG CAPS Take by mouth.  . ondansetron (ZOFRAN ODT) 8 MG disintegrating  tablet Take 1 tablet (8 mg total) by mouth every 8 (eight) hours as needed for nausea or vomiting.  . Probiotic Product (PROBIOTIC PO) Take 1 capsule by mouth daily.  . propranolol (INDERAL) 20 MG tablet Take 1 tablet (20 mg total) by mouth 3 (three) times daily.  . Turmeric 500 MG CAPS Take by mouth.     Allergies:   Metoclopramide, Penicillins, Erenumab-aooe, Fluvoxamine, Penicillin g benzathine, Sumatriptan, and Zoloft [sertraline hcl]   Social History   Socioeconomic History  . Marital status: Married    Spouse name: Jonathon  . Number of children: 1  . Years of education: Not on file  . Highest education level: Bachelor's degree (e.g., BA, AB, BS)  Occupational History  . Occupation: Marine scientist: [id number]  Tobacco Use  . Smoking status: Never Smoker  . Smokeless tobacco: Never Used  Vaping Use  . Vaping Use: Never used  Substance and Sexual Activity  .  Alcohol use: Not Currently  . Drug use: No  . Sexual activity: Yes  Other Topics Concern  . Not on file  Social History Narrative   Patient is right-handed. She lives with her husband and child in a split-level home.    Daughter born 01/2018   She avoids caffeine.    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   Social Determinants of Health   Financial Resource Strain: Not on file  Food Insecurity: Not on file  Transportation Needs: Not on file  Physical Activity: Not on file  Stress: Not on file  Social Connections: Not on file     Family History: The patient's family history includes Anxiety disorder in her sister; Arthritis in her mother; Asthma in her mother; Breast cancer in her maternal aunt; Colon cancer in her maternal grandmother; Colon polyps in her mother; Diabetes in her father, maternal uncle, maternal uncle, mother, paternal aunt, and paternal grandmother; Heart disease in her mother; Hypertension in her father and  mother; Lung cancer in her maternal aunt; Mental illness in her mother; Stomach cancer in her mother. There is no history of Esophageal cancer or Rectal cancer.  ROS:   Review of Systems  Constitution: Negative for decreased appetite, fever and weight gain.  HENT: Negative for congestion, ear discharge, hoarse voice and sore throat.   Eyes: Negative for discharge, redness, vision loss in right eye and visual halos.  Cardiovascular: Negative for chest pain, dyspnea on exertion, leg swelling, orthopnea and palpitations.  Respiratory: Negative for cough, hemoptysis, shortness of breath and snoring.   Endocrine: Negative for heat intolerance and polyphagia.  Hematologic/Lymphatic: Negative for bleeding problem. Does not bruise/bleed easily.  Skin: Negative for flushing, nail changes, rash and suspicious lesions.  Musculoskeletal: Negative for arthritis, joint pain, muscle cramps, myalgias, neck pain and stiffness.  Gastrointestinal: Negative for abdominal pain, bowel incontinence, diarrhea and excessive appetite.  Genitourinary: Negative for decreased libido, genital sores and incomplete emptying.  Neurological: Negative for brief paralysis, focal weakness, headaches and loss of balance.  Psychiatric/Behavioral: Negative for altered mental status, depression and suicidal ideas.  Allergic/Immunologic: Negative for HIV exposure and persistent infections.    EKGs/Labs/Other Studies Reviewed:    The following studies were reviewed today:   EKG: None today  Transthoracic echocardiogram IMPRESSIONS  1. Left ventricular ejection fraction, by visual estimation, is 60 to  65%. The left ventricle has normal function. There is no left ventricular  hypertrophy.  2. The left ventricle has no regional wall motion abnormalities.  3. Global right ventricle has normal systolic function.The right  ventricular size is normal. No increase in right ventricular wall  thickness.  4. Left atrial size was  normal.  5. Right atrial size was normal.  6. The mitral valve is normal in structure. Trivial mitral valve  regurgitation. No evidence of mitral stenosis.  7. The tricuspid valve is normal in structure.  8. The aortic valve is normal in structure. Aortic valve regurgitation is  not visualized. No evidence of aortic valve sclerosis or stenosis.  9. The pulmonic valve was normal in structure. Pulmonic valve  regurgitation is not visualized.  10. Normal pulmonary artery systolic pressure.  11. The tricuspid regurgitant velocity is 2.28 m/s, and with an assumed  right atrial pressure of 3 mmHg, the estimated right ventricular systolic  pressure is normal at 23.8 mmHg.  12. The inferior vena  cava is normal in size with greater than 50%  respiratory variability, suggesting right atrial pressure of 3 mmHg.  13. The average left ventricular global longitudinal strain is -20.5 %.   FINDINGS  Left Ventricle: Left ventricular ejection fraction, by visual estimation,  is 60 to 65%. The left ventricle has normal function. The average left  ventricular global longitudinal strain is -20.5 %. The left ventricle has  no regional wall motion  abnormalities. There is no left ventricular hypertrophy. Left ventricular  diastolic parameters were normal. Normal left atrial pressure.   Right Ventricle: The right ventricular size is normal. No increase in  right ventricular wall thickness. Global RV systolic function is has  normal systolic function. The tricuspid regurgitant velocity is 2.28 m/s,  and with an assumed right atrial pressure  of 3 mmHg, the estimated right ventricular systolic pressure is normal at  23.8 mmHg.   Left Atrium: Left atrial size was normal in size.   Right Atrium: Right atrial size was normal in size   Pericardium: There is no evidence of pericardial effusion.   Mitral Valve: The mitral valve is normal in structure. Trivial mitral  valve regurgitation. No evidence of  mitral valve stenosis by observation.   Tricuspid Valve: The tricuspid valve is normal in structure. Tricuspid  valve regurgitation is mild.   Aortic Valve: The aortic valve is normal in structure. Aortic valve  regurgitation is not visualized. The aortic valve is structurally normal,  with no evidence of sclerosis or stenosis.   Pulmonic Valve: The pulmonic valve was normal in structure. Pulmonic valve  regurgitation is not visualized. Pulmonic regurgitation is not visualized.   Aorta: The aortic root, ascending aorta and aortic arch are all  structurally normal, with no evidence of dilitation or obstruction.   Venous: The inferior vena cava is normal in size with greater than 50%  respiratory variability, suggesting right atrial pressure of 3 mmHg.   IAS/Shunts: No atrial level shunt detected by color flow Doppler. There is  no evidence of a patent foramen ovale. No ventricular septal defect is  seen or detected. There is no evidence of an atrial septal defect.   ZIO monitor The patient wore the monitor for 7 days, starting 06/22/2019.  Indication: Palpitations The minimum heart rate was 51 bpm, maximum heart rate was 175 bpm, and average heart rate 84 bpm.  The predominant underlying rhythm was Sinus Rhythm. Premature atrial complexes were rare (<1.0%). Premature ventricular complexes were rare (<1.0%). There were 7 patient triggered event: 2 was associated with ventricular ectopy, 5 was associated with sinus rhythm.  No ventricular tachycardia, No AV block, No pause, no supraventricular tachycardia and no atrial fibrillation was present.   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     Recent Labs: 06/26/2020: ALT 22 07/26/2020: B Natriuretic Peptide 22.7; BUN 11; Creatinine, Ser 0.82; Hemoglobin 13.7; Platelets 426; Potassium 3.5; Sodium 139 08/01/2020: TSH 1.330  Recent Lipid Panel    Component Value Date/Time   CHOL 196 05/31/2018 1023    TRIG 76 05/31/2018 1023   HDL 53 05/31/2018 1023   CHOLHDL 3.7 05/31/2018 1023   CHOLHDL 3 09/24/2014 0837   VLDL 9.6 09/24/2014 0837   LDLCALC 128 (H) 05/31/2018 1023    Physical Exam:    VS:  BP 118/78   Pulse 78   Ht 5\' 9"  (1.753 m)   Wt 243 lb 6.4 oz (110.4 kg)   SpO2 98%   BMI 35.94 kg/m  Wt Readings from Last 3 Encounters:  10/02/20 243 lb 6.4 oz (110.4 kg)  09/06/20 240 lb (108.9 kg)  08/22/20 241 lb 9.6 oz (109.6 kg)     GEN: Well nourished, well developed in no acute distress HEENT: Normal NECK: No JVD; No carotid bruits LYMPHATICS: No lymphadenopathy CARDIAC: S1S2 noted,RRR, no murmurs, rubs, gallops RESPIRATORY:  Clear to auscultation without rales, wheezing or rhonchi  ABDOMEN: Soft, non-tender, non-distended, +bowel sounds, no guarding. EXTREMITIES: No edema, No cyanosis, no clubbing MUSCULOSKELETAL:  No deformity  SKIN: Warm and dry NEUROLOGIC:  Alert and oriented x 3, non-focal PSYCHIATRIC:  Normal affect, good insight  ASSESSMENT:    1. Anxiety   2. PVC (premature ventricular contraction)   3. Shortness of breath   4. Obesity (BMI 30-39.9)    PLAN:      She has not had any relieving symptoms.  But unfortunately it is very hard to clinically assess the patient because she has not been taking any of the medications that we have prescribed for symptomatic PVCs.  She reports that she did not want to take the flecainide because she says that may have been side effect and she also stopped taking the beta-blocker.  She unfortunately has not also been taking the medicine for her anxiety.    I discussed with her and I did try to educate the patient that we would be able to get better understanding of her clinical picture if she was in these taking the medications as prescribed.  In addition I advised the patient that she could get KardiaMobile device at home that can help her monitor her heart rhythm and gave [place] some information.  In the meantime she  tells me shortness of breath has been worse since her COVID-19 infection and she is concerned given the worsening symptoms I am going to get an echocardiogram to reassess her LV function which was normal when she had her echocardiogram in December 2020.  She has had some daytime fatigue as well as daytime somnolence, she reports that she also snores but it would like to have the patient undergo sleep study to rule out sleep apnea as a source of this.   The patient understands the need to lose weight with diet and exercise. We have discussed specific strategies for this.  The patient is in agreement with the above plan. The patient left the office in stable condition.  The patient will follow up in   Medication Adjustments/Labs and Tests Ordered: Current medicines are reviewed at length with the patient today.  Concerns regarding medicines are outlined above.  Orders Placed This Encounter  Procedures  . Ambulatory referral to Sleep Studies  . Amb Ref to Medical Weight Management  . ECHOCARDIOGRAM COMPLETE   Meds ordered this encounter  Medications  . propranolol (INDERAL) 20 MG tablet    Sig: Take 1 tablet (20 mg total) by mouth 3 (three) times daily.    Dispense:  90 tablet    Refill:  3    Patient Instructions  Medication Instructions:  Your physician has recommended you make the following change in your medication:   START: PROPANOLOL EVERY 8 HOURS  STOP: TOPROL, flecainide (TAMBOCOR) 50 MG tablet *If you need a refill on your cardiac medications before your next appointment, please call your pharmacy*   Lab Work: NONE If you have labs (blood work) drawn today and your tests are completely normal, you will receive your results only by: Marland Kitchen MyChart Message (if you have MyChart)  OR . A paper copy in the mail If you have any lab test that is abnormal or we need to change your treatment, we will call you to review the results.   Testing/Procedures: Your physician has  requested that you have an echocardiogram. Echocardiography is a painless test that uses sound waves to create images of your heart. It provides your doctor with information about the size and shape of your heart and how well your heart's chambers and valves are working. This procedure takes approximately one hour. There are no restrictions for this procedure.  Your physician has recommended that you have a sleep study. This test records several body functions during sleep, including: brain activity, eye movement, oxygen and carbon dioxide blood levels, heart rate and rhythm, breathing rate and rhythm, the flow of air through your mouth and nose, snoring, body muscle movements, and chest and belly movement.   Follow-Up: At [facility], you and your health needs are our priority.  As part of our continuing mission to provide you with exceptional heart care, we have created designated Provider Care Teams.  These Care Teams include your primary Cardiologist (physician) and Advanced Practice Providers (APPs -  Physician Assistants and Nurse Practitioners) who all work together to provide you with the care you need, when you need it.  We recommend signing up for the patient portal called "MyChart".  Sign up information is provided on this After Visit Summary.  MyChart is used to connect with patients for Virtual Visits (Telemedicine).  Patients are able to view lab/test results, encounter notes, upcoming appointments, etc.  Non-urgent messages can be sent to your provider as well.   To learn more about what you can do with MyChart, go to [email or web address].    Your next appointment:   4 week(s)  The format for your next appointment:   In Person  Provider:   Berniece Salines, DO   Other Instructions      Adopting a Healthy Lifestyle.  Know what a healthy weight is for you (roughly BMI <25) and aim to maintain this   Aim for 7+ servings of fruits and vegetables daily   65-80+ fluid ounces  of water or unsweet tea for healthy kidneys   Limit to max 1 drink of alcohol per day; avoid smoking/tobacco   Limit animal fats in diet for cholesterol and heart health - choose grass fed whenever available   Avoid highly processed foods, and foods high in saturated/trans fats   Aim for low stress - take time to unwind and care for your mental health   Aim for 150 min of moderate intensity exercise weekly for heart health, and weights twice weekly for bone health   Aim for 7-9 hours of sleep daily   When it comes to diets, agreement about the perfect plan isnt easy to find, even among the experts. Experts at the [employer] developed an idea known as the Healthy Eating Plate. Just imagine a plate divided into logical, healthy portions.   The emphasis is on diet quality:   Load up on vegetables and fruits - one-half of your plate: Aim for color and variety, and remember that potatoes dont count.   Go for whole grains - one-quarter of your plate: Whole wheat, barley, wheat berries, quinoa, oats, brown rice, and foods made with them. If you want pasta, go with whole wheat pasta.   Protein power - one-quarter of your plate: Fish, chicken, beans, and nuts are all healthy,  versatile protein sources. Limit red meat.   The diet, however, does go beyond the plate, offering a few other suggestions.   Use healthy plant oils, such as olive, canola, soy, corn, sunflower and peanut. Check the labels, and avoid partially hydrogenated oil, which have unhealthy trans fats.   If youre thirsty, drink water. Coffee and tea are good in moderation, but skip sugary drinks and limit milk and dairy products to one or two daily servings.   The type of carbohydrate in the diet is more important than the amount. Some sources of carbohydrates, such as vegetables, fruits, whole grains, and beans-are healthier than others.   Finally, stay active  Signed, Brandy Salines, DO  10/04/2020 5:43  PM    [facility]

## 2020-10-08 ENCOUNTER — Other Ambulatory Visit: Payer: Self-pay

## 2020-10-08 ENCOUNTER — Telehealth: Payer: Self-pay | Admitting: *Deleted

## 2020-10-08 ENCOUNTER — Telehealth: Payer: Self-pay

## 2020-10-08 DIAGNOSIS — R0602 Shortness of breath: Secondary | ICD-10-CM

## 2020-10-08 DIAGNOSIS — F419 Anxiety disorder, unspecified: Secondary | ICD-10-CM

## 2020-10-08 NOTE — Telephone Encounter (Signed)
Staff message sent to Dolores Hoose per patient's insurance no PA is required. Ok to schedule sleep study. Call reference is Clide Deutscher 10/08/20.

## 2020-10-08 NOTE — Telephone Encounter (Signed)
MyChart message sent to patient.

## 2020-10-15 ENCOUNTER — Telehealth: Payer: Self-pay | Admitting: Internal Medicine

## 2020-10-15 NOTE — Telephone Encounter (Signed)
Called patient to remind her of her appointment on 10-18-2020 and she stated she was feeling better and wanted to cancel appointment she stated she would call if she needed [place].

## 2020-10-18 ENCOUNTER — Encounter: Payer: 59 | Admitting: Internal Medicine

## 2020-10-21 ENCOUNTER — Ambulatory Visit: Payer: 59

## 2020-10-22 ENCOUNTER — Ambulatory Visit: Payer: 59

## 2020-10-30 ENCOUNTER — Other Ambulatory Visit: Payer: Self-pay

## 2020-11-01 ENCOUNTER — Encounter: Payer: Self-pay | Admitting: Cardiology

## 2020-11-01 ENCOUNTER — Ambulatory Visit (INDEPENDENT_AMBULATORY_CARE_PROVIDER_SITE_OTHER): Payer: 59

## 2020-11-01 ENCOUNTER — Other Ambulatory Visit: Payer: Self-pay

## 2020-11-01 ENCOUNTER — Ambulatory Visit (INDEPENDENT_AMBULATORY_CARE_PROVIDER_SITE_OTHER): Payer: 59 | Admitting: Cardiology

## 2020-11-01 VITALS — BP 100/80 | HR 70 | Ht 69.0 in | Wt 246.0 lb

## 2020-11-01 DIAGNOSIS — I493 Ventricular premature depolarization: Secondary | ICD-10-CM | POA: Diagnosis not present

## 2020-11-01 DIAGNOSIS — Z6836 Body mass index (BMI) 36.0-36.9, adult: Secondary | ICD-10-CM

## 2020-11-01 DIAGNOSIS — F419 Anxiety disorder, unspecified: Secondary | ICD-10-CM

## 2020-11-01 DIAGNOSIS — R002 Palpitations: Secondary | ICD-10-CM

## 2020-11-01 DIAGNOSIS — Z6834 Body mass index (BMI) 34.0-34.9, adult: Secondary | ICD-10-CM

## 2020-11-01 DIAGNOSIS — E669 Obesity, unspecified: Secondary | ICD-10-CM | POA: Diagnosis not present

## 2020-11-01 LAB — ECHOCARDIOGRAM COMPLETE
Area-P 1/2: 2.87 cm2
Calc EF: 60.1 %
S' Lateral: 2.5 cm
Single Plane A2C EF: 62.7 %
Single Plane A4C EF: 57.2 %

## 2020-11-01 MED ORDER — PROPRANOLOL HCL 20 MG PO TABS: 20.0000 mg | ORAL_TABLET | Freq: Three times a day (TID) | ORAL | 5 refills | Status: DC

## 2020-11-01 NOTE — Progress Notes (Signed)
Cardiology Office Note:    Date:  11/01/2020   ID:  Brandy [name], DOB 08-29-1986, MRN [id number]  PCP:  Lujean Amel, MD  Cardiologist:  Berniece Salines, DO  Electrophysiologist:  None   Referring MD: Lujean Amel, MD   Chief Complaint  Patient presents with  . Follow-up   History of Present Illness:    Brandy [name] is a 35 y.o. female with a hx of anxiety and postpartum depression. The patient was initially seen in consultation on May 31, 2018 for complaints of palpitation and shortness of breath.  During her initial consultationit was noted that the [facility]up with a normal echocardiogram (July 2019) and a treadmill SPECT stress test (which was also July 2019) which I was able to review in Care Everywhere.Her Holter August 29 showed sinus tachycardia. The conclusion at that visit a 48-hour Holter monitor was placed on patient she was encouraged to continue hydration as well as continue treatment for her anxiety. Review of the Holter monitors did not show predominant sinus arrhythmia with 1 PAC. There are no ventricular ectopy or no pauses. Her average heart rate was 82 bpm.  She did continue to have palpitations therefore a longer monitor was placed on patient for 6 days which showed dominant rhythm of sinus rhythm. Isolated atrial ventricular ectopy. 12 of her triggered events were associated with sinus rhythm, one was normal sinus rhythm with PVC, one was sinus tachycardia with PVC and one was sinus rhythm with PAC.  HerMay 5, 2020, was a telemetry medicine visit. At that timeit was discussedwith the patient to pursue noncardiac work-up.  I did see the patient on the June 22, 2019 at which time she was experiencing worsening palpitations with associated dizziness.I placed a monitor on the patient at that time and I did see her in December 2020 and discussed the result of her monitor showing  symptomatic PVCs. Due to low blood pressure as needed Lopressor 12.5 was given to the patient.  I saw the patient on November 03, 2019 at that time I started her on Toprol-XL 12.5 mg daily she reported that she was having worsening palpitations.  I saw the patient on March 31, 2020 at that time she was status post COVID-19 infection. She did have problem with hypoxia in was treated for COVID-19 pneumonia. During that visit she reported that she still experiencing palpitations I increased her Toprol-XL to 12.5 mg twice daily. She is here today for follow-up visit.  She had gotten a coronary CTA which showed calcium score 0.  At her last visit I did discuss with the patient about getting her anxiety treated.  I also placed her on propanolol 20 mg twice a day.  She tells me this has been helping.     Past Medical History:  Diagnosis Date  . Abdominal pain, epigastric 02/18/2017  . Allergy   . Anterior neck pain 12/30/2015  . Anxiety   . Anxiety associated with birthing process 06/14/2018  . B12 deficiency 07/21/2016  . Chronic mixed headache syndrome 06/14/2018  . COVID-19 virus infection 03/2020   with infusion   . Depression   . DUB (dysfunctional uterine bleeding) 06/04/2011  . Dysphagia 07/20/2018  . Enlarged thyroid gland 12/30/2015  . Fatigue 12/30/2015  . Gallstones   . Generalized anxiety disorder 06/09/2016  . GERD (gastroesophageal reflux disease) 02/19/2011  . Headache, unspecified headache type 03/05/2018  . History of nephrolithiasis   . Kidney stone   .  Medication overuse headache 06/14/2018  . Menorrhagia 11/09/2018  . Migraine   . Missed abortion 11/09/2018  . Nausea vomiting and diarrhea 12/28/2013  . Nausea without vomiting 02/18/2017  . NEPHROLITHIASIS, HX OF 12/16/2007   Qualifier: Diagnosis of  By: Sherren Mocha MD, Jory Ee   . Palpitations 03/21/2018  . Pituitary tumor   . 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 Precordial pain 03/21/2018  . PVC's (premature ventricular  contractions)   . Routine general medical examination at a health care facility 09/27/2014  . Serum calcium elevated 12/28/2013  . SVD (spontaneous vaginal delivery) 02/02/2018  . Tendinitis of right ankle 08/14/2014  . 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 Vitamin D deficiency     Past Surgical History:  Procedure Laterality Date  . CHOLECYSTECTOMY  2014  . KIDNEY STONE SURGERY     x 5  . TYMPANOSTOMY TUBE PLACEMENT    . WISDOM TOOTH EXTRACTION      Current Medications: Current Meds  Medication Sig  . acetaminophen (TYLENOL) 500 MG tablet Take 1,000 mg by mouth every 6 (six) hours as needed for mild pain or headache.   . Cholecalciferol (VITAMIN D) 2000 units CAPS Take 2,000 Units by mouth daily.  . clonazePAM (KLONOPIN) 0.5 MG tablet Take 1 tablet (0.5 mg total) by mouth 2 (two) times daily as needed for anxiety.  Marland Kitchen esomeprazole (NEXIUM) 40 MG capsule TAKE 1 CAPSULE (40 MG TOTAL) BY MOUTH 2 (TWO) TIMES DAILY BEFORE A MEAL.  Marland Kitchen FLUoxetine (PROZAC) 20 MG tablet Take 20 mg by mouth daily.  . magnesium oxide (MAG-OX) 400 MG tablet Take 1 tablet by mouth daily.  . Omega-3 Fatty Acids (FISH OIL) 1000 MG CAPS Take by mouth.  . ondansetron (ZOFRAN ODT) 8 MG disintegrating tablet Take 1 tablet (8 mg total) by mouth every 8 (eight) hours as needed for nausea or vomiting.  . Probiotic Product (PROBIOTIC PO) Take 1 capsule by mouth daily.  . propranolol (INDERAL) 20 MG tablet Take 1 tablet (20 mg total) by mouth every 8 (eight) hours.  . Turmeric 500 MG CAPS Take by mouth.  . [DISCONTINUED] metoprolol succinate (TOPROL XL) 25 MG 24 hr tablet Take 0.5 tablets (12.5 mg total) by mouth daily.  . [DISCONTINUED] propranolol (INDERAL) 20 MG tablet Take 20 mg by mouth 2 (two) times daily.     Allergies:   Metoclopramide, Penicillins, Erenumab-aooe, Fluvoxamine, Penicillin g benzathine, Sumatriptan, and Zoloft [sertraline hcl]   Social History   Socioeconomic History  . Marital status: Married    Spouse name:  Jonathon  . Number of children: 1  . Years of education: Not on file  . Highest education level: Bachelor's degree (e.g., BA, AB, BS)  Occupational History  . Occupation: Marine scientist: [id number]  Tobacco Use  . Smoking status: Never Smoker  . Smokeless tobacco: Never Used  Vaping Use  . Vaping Use: Never used  Substance and Sexual Activity  . Alcohol use: Not Currently  . Drug use: No  . Sexual activity: Yes  Other Topics Concern  . Not on file  Social History Narrative   Patient is right-handed. She lives with her husband and child in a split-level home.    Daughter born 01/2018   She avoids caffeine.    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   Social Determinants of Health   Financial Resource Strain: Not  on file  Food Insecurity: Not on file  Transportation Needs: Not on file  Physical Activity: Not on file  Stress: Not on file  Social Connections: Not on file     Family History: The patient's family history includes Anxiety disorder in her sister; Arthritis in her mother; Asthma in her mother; Breast cancer in her maternal aunt; Colon cancer in her maternal grandmother; Colon polyps in her mother; Diabetes in her father, maternal uncle, maternal uncle, mother, paternal aunt, and paternal grandmother; Heart disease in her mother; Hypertension in her father and mother; Lung cancer in her maternal aunt; Mental illness in her mother; Stomach cancer in her mother. There is no history of Esophageal cancer or Rectal cancer.  ROS:   Review of Systems  Constitution: Negative for decreased appetite, fever and weight gain.  HENT: Negative for congestion, ear discharge, hoarse voice and sore throat.   Eyes: Negative for discharge, redness, vision loss in right eye and visual halos.  Cardiovascular: Negative for chest pain, dyspnea on exertion, leg swelling, orthopnea and palpitations.   Respiratory: Negative for cough, hemoptysis, shortness of breath and snoring.   Endocrine: Negative for heat intolerance and polyphagia.  Hematologic/Lymphatic: Negative for bleeding problem. Does not bruise/bleed easily.  Skin: Negative for flushing, nail changes, rash and suspicious lesions.  Musculoskeletal: Negative for arthritis, joint pain, muscle cramps, myalgias, neck pain and stiffness.  Gastrointestinal: Negative for abdominal pain, bowel incontinence, diarrhea and excessive appetite.  Genitourinary: Negative for decreased libido, genital sores and incomplete emptying.  Neurological: Negative for brief paralysis, focal weakness, headaches and loss of balance.  Psychiatric/Behavioral: Negative for altered mental status, depression and suicidal ideas.  Allergic/Immunologic: Negative for HIV exposure and persistent infections.    EKGs/Labs/Other Studies Reviewed:    The following studies were reviewed today:   EKG: None today  Recent Labs: 06/26/2020: ALT 22 07/26/2020: B Natriuretic Peptide 22.7; BUN 11; Creatinine, Ser 0.82; Hemoglobin 13.7; Platelets 426; Potassium 3.5; Sodium 139 08/01/2020: TSH 1.330  Recent Lipid Panel    Component Value Date/Time   CHOL 196 05/31/2018 1023   TRIG 76 05/31/2018 1023   HDL 53 05/31/2018 1023   CHOLHDL 3.7 05/31/2018 1023   CHOLHDL 3 09/24/2014 0837   VLDL 9.6 09/24/2014 0837   LDLCALC 128 (H) 05/31/2018 1023    Physical Exam:    VS:  BP 100/80 (BP Location: Right Arm, Patient Position: Sitting, Cuff Size: Normal)   Pulse 70   Ht 5\' 9"  (1.753 m)   Wt 246 lb (111.6 kg)   SpO2 98%   BMI 36.33 kg/m     Wt Readings from Last 3 Encounters:  11/01/20 246 lb (111.6 kg)  10/02/20 243 lb 6.4 oz (110.4 kg)  09/06/20 240 lb (108.9 kg)     GEN: Well nourished, well developed in no acute distress HEENT: Normal NECK: No JVD; No carotid bruits LYMPHATICS: No lymphadenopathy CARDIAC: S1S2 noted,RRR, no murmurs, rubs,  gallops RESPIRATORY:  Clear to auscultation without rales, wheezing or rhonchi  ABDOMEN: Soft, non-tender, non-distended, +bowel sounds, no guarding. EXTREMITIES: No edema, No cyanosis, no clubbing MUSCULOSKELETAL:  No deformity  SKIN: Warm and dry NEUROLOGIC:  Alert and oriented x 3, non-focal PSYCHIATRIC:  Normal affect, good insight  ASSESSMENT:    1. Obesity, unspecified classification, unspecified obesity type, unspecified whether serious comorbidity present   2. Anxiety   3. Palpitations    PLAN:     She had an echocardiogram this morning which I reviewed and is normal.  I  was able to talk to the patient about this.  All of her questions has been answered.  She will continue on her propanolol.  I am going to give her increased dose of propanolol 20 mg every 8 hours.  Her plan is to take this twice a day and keep the middle dose as needed.  I am in agreement with this.  She will follow with her PCP and psychiatrist she has been started on Prozac.  The patient is in agreement with the above plan. The patient left the office in stable condition.  The patient will follow up in   Medication Adjustments/Labs and Tests Ordered: Current medicines are reviewed at length with the patient today.  Concerns regarding medicines are outlined above.  Orders Placed This Encounter  Procedures  . Amb Ref to Medical Weight Management   Meds ordered this encounter  Medications  . propranolol (INDERAL) 20 MG tablet    Sig: Take 1 tablet (20 mg total) by mouth every 8 (eight) hours.    Dispense:  180 tablet    Refill:  5    Patient Instructions  Medication Instructions:  Your physician has recommended you make the following change in your medication:  INCREASE: Propranolol 20 mg every 8 hours *If you need a refill on your cardiac medications before your next appointment, please call your pharmacy*   Lab Work: None If you have labs (blood work) drawn today and your tests are completely  normal, you will receive your results only by: Marland Kitchen MyChart Message (if you have MyChart) OR . A paper copy in the mail If you have any lab test that is abnormal or we need to change your treatment, we will call you to review the results.   Testing/Procedures: None   Follow-Up: At [facility], you and your health needs are our priority.  As part of our continuing mission to provide you with exceptional heart care, we have created designated Provider Care Teams.  These Care Teams include your primary Cardiologist (physician) and Advanced Practice Providers (APPs -  Physician Assistants and Nurse Practitioners) who all work together to provide you with the care you need, when you need it.  We recommend signing up for the patient portal called "MyChart".  Sign up information is provided on this After Visit Summary.  MyChart is used to connect with patients for Virtual Visits (Telemedicine).  Patients are able to view lab/test results, encounter notes, upcoming appointments, etc.  Non-urgent messages can be sent to your provider as well.   To learn more about what you can do with MyChart, go to [email or web address].    Your next appointment:   1 year(s)  The format for your next appointment:   In Person  Provider:   Berniece Salines, DO   Other Instructions      Adopting a Healthy Lifestyle.  Know what a healthy weight is for you (roughly BMI <25) and aim to maintain this   Aim for 7+ servings of fruits and vegetables daily   65-80+ fluid ounces of water or unsweet tea for healthy kidneys   Limit to max 1 drink of alcohol per day; avoid smoking/tobacco   Limit animal fats in diet for cholesterol and heart health - choose grass fed whenever available   Avoid highly processed foods, and foods high in saturated/trans fats   Aim for low stress - take time to unwind and care for your mental health   Aim for 150 min of moderate intensity exercise  weekly for heart health, and  weights twice weekly for bone health   Aim for 7-9 hours of sleep daily   When it comes to diets, agreement about the perfect plan isnt easy to find, even among the experts. Experts at the [employer] developed an idea known as the Healthy Eating Plate. Just imagine a plate divided into logical, healthy portions.   The emphasis is on diet quality:   Load up on vegetables and fruits - one-half of your plate: Aim for color and variety, and remember that potatoes dont count.   Go for whole grains - one-quarter of your plate: Whole wheat, barley, wheat berries, quinoa, oats, brown rice, and foods made with them. If you want pasta, go with whole wheat pasta.   Protein power - one-quarter of your plate: Fish, chicken, beans, and nuts are all healthy, versatile protein sources. Limit red meat.   The diet, however, does go beyond the plate, offering a few other suggestions.   Use healthy plant oils, such as olive, canola, soy, corn, sunflower and peanut. Check the labels, and avoid partially hydrogenated oil, which have unhealthy trans fats.   If youre thirsty, drink water. Coffee and tea are good in moderation, but skip sugary drinks and limit milk and dairy products to one or two daily servings.   The type of carbohydrate in the diet is more important than the amount. Some sources of carbohydrates, such as vegetables, fruits, whole grains, and beans-are healthier than others.   Finally, stay active  Signed, Berniece Salines, DO  11/01/2020 11:19 AM    [place]

## 2020-11-01 NOTE — Progress Notes (Signed)
Complete echocardiogram performed.  Jimmy Shirl Ludington RDCS, RVT  

## 2020-11-01 NOTE — Patient Instructions (Addendum)
Medication Instructions:  Your physician has recommended you make the following change in your medication:  INCREASE: Propranolol 20 mg every 8 hours *If you need a refill on your cardiac medications before your next appointment, please call your pharmacy*   Lab Work: None If you have labs (blood work) drawn today and your tests are completely normal, you will receive your results only by: Marland Kitchen MyChart Message (if you have MyChart) OR . A paper copy in the mail If you have any lab test that is abnormal or we need to change your treatment, we will call you to review the results.   Testing/Procedures: None   Follow-Up: At [facility], you and your health needs are our priority.  As part of our continuing mission to provide you with exceptional heart care, we have created designated Provider Care Teams.  These Care Teams include your primary Cardiologist (physician) and Advanced Practice Providers (APPs -  Physician Assistants and Nurse Practitioners) who all work together to provide you with the care you need, when you need it.  We recommend signing up for the patient portal called "MyChart".  Sign up information is provided on this After Visit Summary.  MyChart is used to connect with patients for Virtual Visits (Telemedicine).  Patients are able to view lab/test results, encounter notes, upcoming appointments, etc.  Non-urgent messages can be sent to your provider as well.   To learn more about what you can do with MyChart, go to [email or web address].    Your next appointment:   1 year(s)  The format for your next appointment:   In Person  Provider:   Berniece Salines, DO   Other Instructions

## 2020-11-06 ENCOUNTER — Other Ambulatory Visit: Payer: Self-pay | Admitting: Physician Assistant

## 2020-11-06 ENCOUNTER — Telehealth: Payer: Self-pay | Admitting: Physician Assistant

## 2020-11-06 MED ORDER — FLUOXETINE HCL 20 MG PO CAPS: 20.0000 mg | ORAL_CAPSULE | Freq: Every day | ORAL | 1 refills | Status: DC

## 2020-11-06 NOTE — Telephone Encounter (Signed)
Prescription was sent

## 2020-11-06 NOTE — Telephone Encounter (Signed)
Brandy [name] called to request a refill of Prozac.  Said she was on it in the past and feels she needs to go on it.  Has appt 01/01/21.  Please send to [facility] [address] in [place], [place]

## 2020-11-12 ENCOUNTER — Telehealth: Payer: Self-pay | Admitting: Family Medicine

## 2020-11-12 NOTE — Telephone Encounter (Signed)
Copied from [place] [phone]. Topic: Medical Record Request - Other >> Nov 28, 2018  7:17 AM Rayann Heman wrote: Kieth Brightly called from [place] left a voice mail that she is trying to get in touch with someone from the referral department. She states that she needs medical records for patient regarding a referral. Please advise [phone] Please put Attention [facility]

## 2020-11-25 ENCOUNTER — Ambulatory Visit (HOSPITAL_BASED_OUTPATIENT_CLINIC_OR_DEPARTMENT_OTHER): Payer: 59 | Attending: Cardiology | Admitting: Cardiovascular Disease

## 2020-11-25 ENCOUNTER — Telehealth: Payer: Self-pay | Admitting: Nurse Practitioner

## 2020-11-25 NOTE — Telephone Encounter (Signed)
Patient will come in and see Carl Best, RNP Tomorrow at 1:30

## 2020-11-25 NOTE — Telephone Encounter (Signed)
Pt has been experiencing blood in stool with mucus since the past weekend. Pt reports pressure in rectum. She would like to be seen asap.

## 2020-11-26 ENCOUNTER — Other Ambulatory Visit (INDEPENDENT_AMBULATORY_CARE_PROVIDER_SITE_OTHER): Payer: 59

## 2020-11-26 ENCOUNTER — Ambulatory Visit (INDEPENDENT_AMBULATORY_CARE_PROVIDER_SITE_OTHER): Payer: 59 | Admitting: Nurse Practitioner

## 2020-11-26 ENCOUNTER — Encounter: Payer: Self-pay | Admitting: Nurse Practitioner

## 2020-11-26 ENCOUNTER — Other Ambulatory Visit: Payer: Self-pay

## 2020-11-26 VITALS — BP 100/70 | HR 88 | Ht 69.0 in | Wt 243.0 lb

## 2020-11-26 DIAGNOSIS — R197 Diarrhea, unspecified: Secondary | ICD-10-CM

## 2020-11-26 DIAGNOSIS — R1030 Lower abdominal pain, unspecified: Secondary | ICD-10-CM | POA: Diagnosis not present

## 2020-11-26 DIAGNOSIS — R198 Other specified symptoms and signs involving the digestive system and abdomen: Secondary | ICD-10-CM

## 2020-11-26 LAB — CBC WITH DIFFERENTIAL/PLATELET
Basophils Absolute: 0.1 10*3/uL (ref 0.0–0.1)
Basophils Relative: 0.8 % (ref 0.0–3.0)
Eosinophils Absolute: 0.1 10*3/uL (ref 0.0–0.7)
Eosinophils Relative: 1.3 % (ref 0.0–5.0)
HCT: 37.9 % (ref 36.0–46.0)
Hemoglobin: 12.8 g/dL (ref 12.0–15.0)
Lymphocytes Relative: 18.8 % (ref 12.0–46.0)
Lymphs Abs: 1.2 10*3/uL (ref 0.7–4.0)
MCHC: 33.8 g/dL (ref 30.0–36.0)
MCV: 84.8 fl (ref 78.0–100.0)
Monocytes Absolute: 0.4 10*3/uL (ref 0.1–1.0)
Monocytes Relative: 6.8 % (ref 3.0–12.0)
Neutro Abs: 4.6 10*3/uL (ref 1.4–7.7)
Neutrophils Relative %: 72.3 % (ref 43.0–77.0)
Platelets: 398 10*3/uL (ref 150.0–400.0)
RBC: 4.47 Mil/uL (ref 3.87–5.11)
RDW: 13.7 % (ref 11.5–15.5)
WBC: 6.4 10*3/uL (ref 4.0–10.5)

## 2020-11-26 LAB — COMPREHENSIVE METABOLIC PANEL
ALT: 21 U/L (ref 0–35)
AST: 15 U/L (ref 0–37)
Albumin: 4.2 g/dL (ref 3.5–5.2)
Alkaline Phosphatase: 69 U/L (ref 39–117)
BUN: 11 mg/dL (ref 6–23)
CO2: 29 mEq/L (ref 19–32)
Calcium: 9.4 mg/dL (ref 8.4–10.5)
Chloride: 101 mEq/L (ref 96–112)
Creatinine, Ser: 0.97 mg/dL (ref 0.40–1.20)
GFR: 76.65 mL/min (ref >60.00)
Glucose, Bld: 112 mg/dL — ABNORMAL HIGH (ref 70–99)
Potassium: 3.9 mEq/L (ref 3.5–5.1)
Sodium: 138 mEq/L (ref 135–145)
Total Bilirubin: 0.4 mg/dL (ref 0.2–1.2)
Total Protein: 7.5 g/dL (ref 6.0–8.3)

## 2020-11-26 LAB — C-REACTIVE PROTEIN: CRP: 1 mg/dL (ref 0.5–20.0)

## 2020-11-26 NOTE — Progress Notes (Addendum)
11/26/2020 Brandy [name] [id number] [birth year]   Chief Complaint: Diarrhea   History of Present Illness: Brandy [name] is a 35 year old female with a past medical history of anxiety, depression, kidney stones, pituitary adenoma, GERD and Covid 19 pneumonia 03/2020. She is followed by Dr. Carlean Purl.  I saw the patient in office on 06/26/2020 for further evaluation regarding nausea and epigastric pain.  An EGD was recommended she did not wish to pursue an EGD at that time.  She presents to our office today for further evaluation regarding diarrhea, bloody mucus per the rectum with associated rectal pressure.  She reports having on and off diarrhea for the past 6 months which has worsened over the past 2 weeks.  She describes passing watery golden colored nonbloody diarrhea 4 times daily, usually in the morning.  She sees occasional bright red blood on the toilet tissue and on several occasions she has passed mucus with streaks of red blood in it.  She is having central lower abdominal pain with increased urgency.  She describes feeling no rectal pressure which is constant.  She passed a solid soft formed stool earlier today.  She took Amoxicillin 3 times daily for 7 days due to having a sinus infection 3 weeks ago.  No other antibiotics since that time.  She is taking a probiotic daily for the past 2 days.  No nausea or vomiting.  No heartburn or epigastric pain.  She remains on esomeprazole 40 mg twice daily.  Mother with history of stomach cancer.  Maternal grandmother with history of colon cancer.  No family history of IBD.  Current Outpatient Medications on File Prior to Visit  Medication Sig Dispense Refill  . acetaminophen (TYLENOL) 500 MG tablet Take 1,000 mg by mouth every 6 (six) hours as needed for mild pain or headache.     . Cholecalciferol (VITAMIN D) 2000 units CAPS Take 2,000 Units by mouth daily.    . clonazePAM (KLONOPIN) 0.5 MG tablet Take 1 tablet (0.5 mg total) by mouth 2 (two)  times daily as needed for anxiety. 60 tablet 3  . esomeprazole (NEXIUM) 40 MG capsule TAKE 1 CAPSULE (40 MG TOTAL) BY MOUTH 2 (TWO) TIMES DAILY BEFORE A MEAL. 180 capsule 1  . FLUoxetine (PROZAC) 20 MG capsule Take 1 capsule (20 mg total) by mouth daily. 30 capsule 1  . magnesium oxide (MAG-OX) 400 MG tablet Take 1 tablet by mouth daily.    . Omega-3 Fatty Acids (FISH OIL) 1000 MG CAPS Take by mouth.    . ondansetron (ZOFRAN ODT) 8 MG disintegrating tablet Take 1 tablet (8 mg total) by mouth every 8 (eight) hours as needed for nausea or vomiting. 20 tablet 0  . Probiotic Product (PROBIOTIC PO) Take 1 capsule by mouth daily.    . propranolol (INDERAL) 20 MG tablet Take 1 tablet (20 mg total) by mouth every 8 (eight) hours. 180 tablet 5  . Turmeric 500 MG CAPS Take by mouth.    . [DISCONTINUED] DULoxetine (CYMBALTA) 20 MG capsule Take 1 capsule (20 mg total) by mouth daily. (Patient not taking: Reported on 03/20/2019) 30 capsule 1   No current facility-administered medications on file prior to visit.   Allergies  Allergen Reactions  . Metoclopramide Palpitations and Other (See Comments)    Rapid heart rate  . Penicillins Hives, Rash and Other (See Comments)    Childhood reaction Has patient had a PCN reaction causing immediate rash, facial/tongue/throat swelling, SOB or lightheadedness with hypotension:  No Has patient had a PCN reaction causing severe rash involving mucus membranes or skin necrosis: No Has patient had a PCN reaction that required hospitalization No Has patient had a PCN reaction occurring within the last 10 years: No If all of the above answers are "NO", then may proceed with Cephalosporin use.  Eduard Roux Other (See Comments)    Other reaction(s): Myalgias (intolerance) Constipation, hair loss  . Fluvoxamine Other (See Comments)    Other reaction(s): Fainted  . Penicillin G Benzathine Other (See Comments)  . Sumatriptan Palpitations  . Zoloft [Sertraline Hcl]  Anxiety    Current Medications, Allergies, Past Medical History, Past Surgical History, Family History and Social History were reviewed in [employer] record.  Review of Systems:   Constitutional: Negative for fever, sweats, chills or weight loss.  Respiratory: Negative for shortness of breath.   Cardiovascular: Negative for chest pain, palpitations and leg swelling.  Gastrointestinal: See HPI.  Musculoskeletal: Negative for back pain or muscle aches.  Neurological: Negative for dizziness, headaches or paresthesias.   Physical Exam: BP 100/70   Pulse 88   Ht 5\' 9"  (1.753 m)   Wt 243 lb (110.2 kg)   BMI 35.88 kg/m   Wt Readings from Last 3 Encounters:  11/26/20 243 lb (110.2 kg)  11/01/20 246 lb (111.6 kg)  10/02/20 243 lb 6.4 oz (110.4 kg)   General: 35 year old female in no acute distress. Head: Normocephalic and atraumatic. Eyes: No scleral icterus. Conjunctiva pink . Ears: Normal auditory acuity. Mouth: Dentition intact. No ulcers or lesions.  Lungs: Clear throughout to auscultation. Heart: Regular rate and rhythm, no murmur. Abdomen: Soft, nontender and nondistended. No masses or hepatomegaly. Normal bowel sounds x 4 quadrants.  Rectal: Peri anal erythema. Small internal hemorrhoids without prolapse. No fissure or external hemorrhoids. Magda Paganini CMA present.  Musculoskeletal: Symmetrical with no gross deformities. Extremities: No edema. Neurological: Alert oriented x 4. No focal deficits.  Psychological: Alert and cooperative. Normal mood and affect  Assessment and Recommendations:  20.  35 year old female with diarrhea with intermittent mucous with red blood streaks, red blood on the toilet tissue, central lower abdominal pain and rectal pressure.  She was prescribed amoxicillin 3 times daily for 7 days approximately 3 weeks ago due to having a sinus infection.  No history of C. difficile infection. -GI pathogen panel to include C. difficile PCR,  CBC, CMP, CRP, TTG, IgA -Discussed scheduling a diagnostic colonoscopy to rule out colitis/IBD, await the above laboratory results -Florastor probiotic 1 p.o. twice daily -Push fluids, bland diet for now -Apply a small amount of Desitin inside the anal opening and to the external anal area tid as needed for anal or hemorrhoidal irritation/bleeding.   2.  GERD, stable -Continue PPI as previously prescribed for now  3.  Epigastric pain, resolved  4. Anxiety, stable   ADDENDUM: Patient called our office to request an EGD (as previously discussed) at the time of her colonoscopy. Patient to schedule EGD and colonoscopy (GI pathogen panel was negative).

## 2020-11-26 NOTE — Patient Instructions (Addendum)
If you are age 34 or younger, your body mass index should be between 19-25. Your Body mass index is 35.88 kg/m. If this is out of the aformentioned range listed, please consider follow up with your Primary Care Provider.   LABS:  Lab work has been ordered for you today. Our lab is located in the basement. Press "B" on the elevator. The lab is located at the first door on the left as you exit the elevator.  HEALTHCARE LAWS AND MY CHART RESULTS: Due to recent changes in healthcare laws, you may see the results of your imaging and laboratory studies on MyChart before your provider has had a chance to review them.   We understand that in some cases there may be results that are confusing or concerning to you. Not all laboratory results come back in the same time frame and the provider may be waiting for multiple results in order to interpret others.  Please give [place] 48 hours in order for your provider to thoroughly review all the results before contacting the office for clarification of your results.   Try Florastor 2 a day for the next 2-4 weeks. Follow a bland diet and increase water intake.  Please call our office if your symptoms worsen.  It was great seeing you today! Thank you for entrusting me with your care and choosing [facility].  Noralyn Pick, CRNP

## 2020-11-27 LAB — TISSUE TRANSGLUTAMINASE ABS,IGG,IGA
(tTG) Ab, IgA: <1 U/mL
(tTG) Ab, IgG: <1 U/mL

## 2020-11-27 LAB — IGA: Immunoglobulin A: 287 mg/dL (ref 47–310)

## 2020-11-28 ENCOUNTER — Other Ambulatory Visit: Payer: 59

## 2020-11-28 ENCOUNTER — Other Ambulatory Visit: Payer: Self-pay | Admitting: Physician Assistant

## 2020-11-28 DIAGNOSIS — R1030 Lower abdominal pain, unspecified: Secondary | ICD-10-CM

## 2020-11-28 DIAGNOSIS — R197 Diarrhea, unspecified: Secondary | ICD-10-CM

## 2020-11-28 DIAGNOSIS — R198 Other specified symptoms and signs involving the digestive system and abdomen: Secondary | ICD-10-CM

## 2020-12-01 LAB — GI PROFILE, STOOL, PCR

## 2020-12-02 ENCOUNTER — Telehealth: Payer: Self-pay | Admitting: Nurse Practitioner

## 2020-12-02 NOTE — Telephone Encounter (Signed)
Message sent to the schedulers to set up endo colon with Dr Carlean Purl.  The pt has been advised and aware that she will be contacted to set up procedures.

## 2020-12-02 NOTE — Telephone Encounter (Signed)
Brandy [name], yes, she previously declined an EGD. Pls add EGD to colonoscopy orders and let patient know. Thx

## 2020-12-02 NOTE — Telephone Encounter (Signed)
The pt would like to know if she can have an EGD as well as Colon.  She has GERD symptoms.

## 2020-12-03 NOTE — Telephone Encounter (Signed)
Called to attempt scheduling, no answer and voicemail was full. Will try again later.

## 2020-12-04 ENCOUNTER — Encounter: Payer: Self-pay | Admitting: Internal Medicine

## 2020-12-04 NOTE — Telephone Encounter (Signed)
ECL scheduled w/pt for 03-06-21 at 9am.

## 2020-12-26 ENCOUNTER — Ambulatory Visit (INDEPENDENT_AMBULATORY_CARE_PROVIDER_SITE_OTHER): Payer: 59 | Admitting: Family Medicine

## 2020-12-26 ENCOUNTER — Encounter (INDEPENDENT_AMBULATORY_CARE_PROVIDER_SITE_OTHER): Payer: Self-pay | Admitting: Family Medicine

## 2020-12-26 ENCOUNTER — Other Ambulatory Visit: Payer: Self-pay

## 2020-12-26 VITALS — BP 105/72 | HR 91 | Temp 97.8°F | Ht 68.0 in | Wt 242.0 lb

## 2020-12-26 DIAGNOSIS — R0602 Shortness of breath: Secondary | ICD-10-CM

## 2020-12-26 DIAGNOSIS — R5383 Other fatigue: Secondary | ICD-10-CM

## 2020-12-26 DIAGNOSIS — F32A Depression, unspecified: Secondary | ICD-10-CM

## 2020-12-26 DIAGNOSIS — Z9189 Other specified personal risk factors, not elsewhere classified: Secondary | ICD-10-CM | POA: Diagnosis not present

## 2020-12-26 DIAGNOSIS — Z6836 Body mass index (BMI) 36.0-36.9, adult: Secondary | ICD-10-CM

## 2020-12-26 DIAGNOSIS — E559 Vitamin D deficiency, unspecified: Secondary | ICD-10-CM

## 2020-12-26 DIAGNOSIS — R7303 Prediabetes: Secondary | ICD-10-CM

## 2020-12-26 DIAGNOSIS — K760 Fatty (change of) liver, not elsewhere classified: Secondary | ICD-10-CM

## 2020-12-26 DIAGNOSIS — F419 Anxiety disorder, unspecified: Secondary | ICD-10-CM

## 2020-12-26 DIAGNOSIS — Z0289 Encounter for other administrative examinations: Secondary | ICD-10-CM

## 2020-12-26 DIAGNOSIS — Z1331 Encounter for screening for depression: Secondary | ICD-10-CM

## 2020-12-27 LAB — CBC WITH DIFFERENTIAL
Basophils Absolute: 0.1 10*3/uL (ref 0.0–0.2)
Basos: 1 %
EOS (ABSOLUTE): 0.1 10*3/uL (ref 0.0–0.4)
Eos: 1 %
Hematocrit: 41.8 % (ref 34.0–46.6)
Hemoglobin: 13.4 g/dL (ref 11.1–15.9)
Immature Grans (Abs): 0 10*3/uL (ref 0.0–0.1)
Immature Granulocytes: 0 %
Lymphocytes Absolute: 1.6 10*3/uL (ref 0.7–3.1)
Lymphs: 20 %
MCH: 27.9 pg (ref 26.6–33.0)
MCHC: 32.1 g/dL (ref 31.5–35.7)
MCV: 87 fL (ref 79–97)
Monocytes Absolute: 0.5 10*3/uL (ref 0.1–0.9)
Monocytes: 7 %
Neutrophils Absolute: 5.6 10*3/uL (ref 1.4–7.0)
Neutrophils: 71 %
RBC: 4.81 x10E6/uL (ref 3.77–5.28)
RDW: 12.6 % (ref 11.7–15.4)
WBC: 7.9 10*3/uL (ref 3.4–10.8)

## 2020-12-27 LAB — T3: T3, Total: 170 ng/dL (ref 71–180)

## 2020-12-27 LAB — COMPREHENSIVE METABOLIC PANEL
ALT: 24 IU/L (ref 0–32)
AST: 16 IU/L (ref 0–40)
Albumin/Globulin Ratio: 1.9 (ref 1.2–2.2)
Albumin: 4.5 g/dL (ref 3.8–4.8)
Alkaline Phosphatase: 78 IU/L (ref 44–121)
BUN/Creatinine Ratio: 12 (ref 9–23)
BUN: 13 mg/dL (ref 6–20)
Bilirubin Total: <0.2 mg/dL (ref 0.0–1.2)
CO2: 21 mmol/L (ref 20–29)
Calcium: 8.9 mg/dL (ref 8.7–10.2)
Chloride: 104 mmol/L (ref 96–106)
Creatinine, Ser: 1.06 mg/dL — ABNORMAL HIGH (ref 0.57–1.00)
Globulin, Total: 2.4 g/dL (ref 1.5–4.5)
Glucose: 93 mg/dL (ref 65–99)
Potassium: 4.7 mmol/L (ref 3.5–5.2)
Sodium: 140 mmol/L (ref 134–144)
Total Protein: 6.9 g/dL (ref 6.0–8.5)
eGFR: 71 mL/min/{1.73_m2} (ref >59)

## 2020-12-27 LAB — T4: T4, Total: 10.1 ug/dL (ref 4.5–12.0)

## 2020-12-27 LAB — HEMOGLOBIN A1C
Est. average glucose Bld gHb Est-mCnc: 126 mg/dL
Hgb A1c MFr Bld: 6 % — ABNORMAL HIGH (ref 4.8–5.6)

## 2020-12-27 LAB — TSH: TSH: 1.54 u[IU]/mL (ref 0.450–4.500)

## 2020-12-27 LAB — LIPID PANEL WITH LDL/HDL RATIO
Cholesterol, Total: 181 mg/dL (ref 100–199)
HDL: 40 mg/dL (ref >39)
LDL Chol Calc (NIH): 122 mg/dL — ABNORMAL HIGH (ref 0–99)
LDL/HDL Ratio: 3.1 ratio (ref 0.0–3.2)
Triglycerides: 106 mg/dL (ref 0–149)
VLDL Cholesterol Cal: 19 mg/dL (ref 5–40)

## 2020-12-27 LAB — FOLATE: Folate: 2.2 ng/mL — ABNORMAL LOW (ref >3.0)

## 2020-12-27 LAB — VITAMIN B12: Vitamin B-12: 290 pg/mL (ref 232–1245)

## 2020-12-27 LAB — VITAMIN D 25 HYDROXY (VIT D DEFICIENCY, FRACTURES): Vit D, 25-Hydroxy: 44.1 ng/mL (ref 30.0–100.0)

## 2020-12-27 LAB — INSULIN, RANDOM: INSULIN: 28.4 u[IU]/mL — ABNORMAL HIGH (ref 2.6–24.9)

## 2020-12-30 NOTE — Progress Notes (Signed)
Dear Dr. Harriet Masson,   Thank you for referring Brandy [name] to our clinic. The following note includes my evaluation and treatment recommendations.  Chief Complaint:   OBESITY Brandy [name] (MR# [id number]) is a 35 y.o. female who presents for evaluation and treatment of obesity and related comorbidities. Current BMI is Body mass index is 36.8 kg/m. Brandy [name] has been struggling with her weight for many years and has been unsuccessful in either losing weight, maintaining weight loss, or reaching her healthy weight goal.  Brandy [name] was referred by cardiology. She is a Art gallery manager for [employer]. Her husband will likely not eat the same and enjoys indulgent food. Apple in AM to go. [employer] salad with dressing and diet coke (satisfied); no snacks; Dinner- 3 oz chicken, 1 cup rice, Brussels sprouts; after dinner- ate out of bag baked chips.  Brandy [name] is currently in the action stage of change and ready to dedicate time achieving and maintaining a healthier weight. Brandy [name] is interested in becoming our patient and working on intensive lifestyle modifications including (but not limited to) diet and exercise for weight loss.  Brandy [name]'s habits were reviewed today and are as follows: Her family eats meals together, her desired weight loss is 62 lbs, she started gaining weight after having her daughter, her heaviest weight ever was 246 pounds, she has significant food cravings issues, she snacks frequently in the evenings, she frequently makes poor food choices, she has problems with excessive hunger, she frequently eats larger portions than normal, she has binge eating behaviors and she struggles with emotional eating.  Depression Screen Brandy [name]'s Food and Mood (modified PHQ-9) score was 9.  Depression screen [facility] 2/9 12/26/2020  Decreased Interest 3  Down, Depressed, Hopeless 1  PHQ - 2 Score 4  Altered sleeping 0  Tired, decreased energy 1  Change in appetite 3  Feeling bad or failure  about yourself  0  Trouble concentrating 1  Moving slowly or fidgety/restless 0  Suicidal thoughts 0  PHQ-9 Score 9  Difficult doing work/chores Not difficult at all  Some recent data might be hidden   Subjective:   1. Other fatigue Brandy [name] admits to daytime somnolence and admits to waking up still tired. Patent has a history of symptoms of daytime fatigue and morning fatigue. [name] generally gets 8 hours of sleep per night, and states that she has poor sleep quality. Snoring is present. Apneic episodes are not present. Epworth Sleepiness Score is 9. EKG normal sinus rhythm at 90 bpm.  2. SOB (shortness of breath) on exertion Brandy [name] notes increasing shortness of breath with exercising and seems to be worsening over time with weight gain. She notes getting out of breath sooner with activity than she used to. This has gotten worse recently. [name] denies shortness of breath at rest or orthopnea. EKG normal sinus rhythm at 90 bpm.  3. Pre-diabetes Brandy [name]'s last A1c was 5.9. she has been pre-diabetic for 3-4 months. She is no on meds.  4. Vitamin D deficiency No Vit D level in the last 3 months. She is on OTC Vit D daily. She notes fatigue.  5. Anxiety and depression Brandy [name] is on Klonopin but is trying to wean off. She was previously on fluvoxamine, Zoloft, and Prozac. She could not the first 2.   6. NAFLD (nonalcoholic fatty liver disease) Brandy [name] was diagnosed previously through ultrasound in 2021.   7. At risk for diabetes mellitus Brandy [name] is at higher than average risk for developing diabetes due to obesity.  Assessment/Plan:   1. Other fatigue [name] does feel that her weight is causing her energy to be lower than it should be. Fatigue may be related to obesity, depression or many other causes. Labs will be ordered, and in the meanwhile, Brandy [name] will focus on self care including making healthy food choices, increasing physical activity and focusing on stress reduction. Check labs  today.  - Vitamin B12 - TSH - Folate - T3 - T4 - EKG 12-Lead  2. SOB (shortness of breath) on exertion Brandy [name] does feel that she gets out of breath more easily that she used to when she exercises. [name]'s shortness of breath appears to be obesity related and exercise induced. She has agreed to work on weight loss and gradually increase exercise to treat her exercise induced shortness of breath. Will continue to monitor closely. Check labs today.  - Lipid Panel With LDL/HDL Ratio - CBC With Differential  3. Pre-diabetes Brandy [name] will continue to work on weight loss, exercise, and decreasing simple carbohydrates to help decrease the risk of diabetes. Check labs today.  - Insulin, random - Hemoglobin A1c  4. Vitamin D deficiency Low Vitamin D level contributes to fatigue and are associated with obesity, breast, and colon cancer. She agrees to continue to take OTC Vitamin D @2 ,000 IU daily and will follow-up for routine testing of Vitamin D, at least 2-3 times per year to avoid over-replacement. Check labs today.  - VITAMIN D 25 Hydroxy (Vit-D Deficiency, Fractures)  5. Anxiety and depression Behavior modification techniques were discussed today to help [name] deal with her anxiety.  Orders and follow up as documented in patient record. Follow up at next appointment.  6. NAFLD (nonalcoholic fatty liver disease) We discussed the likely diagnosis of non-alcoholic fatty liver disease today and how this condition is obesity related. Brandy [name] was educated the importance of weight loss. Brandy [name] agreed to continue with her weight loss efforts with healthier diet and exercise as an essential part of her treatment plan. Check labs today.  - Comprehensive metabolic panel  7. Screening for depression Brandy [name] had a positive depression screening. Depression is commonly associated with obesity and often results in emotional eating behaviors. We will monitor this closely and work on CBT to help improve  the non-hunger eating patterns. Referral to Psychology may be required if no improvement is seen as she continues in our clinic.  8. At risk for diabetes mellitus Brandy [name] was given approximately 15 minutes of diabetes education and counseling today. We discussed intensive lifestyle modifications today with an emphasis on weight loss as well as increasing exercise and decreasing simple carbohydrates in her diet. We also reviewed medication options with an emphasis on risk versus benefit of those discussed.   Repetitive spaced learning was employed today to elicit superior memory formation and behavioral change.  9. Class 2 severe obesity with serious comorbidity and body mass index (BMI) of 36.0 to 36.9 in adult, unspecified obesity type (HCC) Brandy [name] is currently in the action stage of change and her goal is to continue with weight loss efforts. I recommend Brandy [name] begin the structured treatment plan as follows:  She has agreed to the Category 3 Plan with 8 oz at breakfast.  Exercise goals: No exercise has been prescribed at this time.   Behavioral modification strategies: increasing lean protein intake, meal planning and cooking strategies, keeping healthy foods in the home and planning for success.  She was informed of the importance of frequent follow-up visits to maximize her success with intensive  lifestyle modifications for her multiple health conditions. She was informed we would discuss her lab results at her next visit unless there is a critical issue that needs to be addressed sooner. Brandy [name] agreed to keep her next visit at the agreed upon time to discuss these results.  Objective:   Blood pressure 105/72, pulse 91, temperature 97.8 F (36.6 C), height 5\' 8"  (1.727 m), weight 242 lb (109.8 kg), SpO2 96 %. Body mass index is 36.8 kg/m.  EKG: Normal sinus rhythm, rate 90.  Indirect Calorimeter completed today shows a VO2 of 241 and a REE of 1676.  Her calculated basal metabolic rate is  3818 thus her basal metabolic rate is worse than expected.  General: Cooperative, alert, well developed, in no acute distress. HEENT: Conjunctivae and lids unremarkable. Cardiovascular: Regular rhythm.  Lungs: Normal work of breathing. Neurologic: No focal deficits.   Lab Results  Component Value Date   CREATININE 1.06 (H) 12/26/2020   BUN 13 12/26/2020   NA 140 12/26/2020   K 4.7 12/26/2020   CL 104 12/26/2020   CO2 21 12/26/2020   Lab Results  Component Value Date   ALT 24 12/26/2020   AST 16 12/26/2020   ALKPHOS 78 12/26/2020   BILITOT <0.2 12/26/2020   Lab Results  Component Value Date   HGBA1C 6.0 (H) 12/26/2020   HGBA1C 5.4 05/31/2018   HGBA1C 5.3 06/09/2016   HGBA1C  01/13/2009    5.1 (NOTE) The ADA recommends the following therapeutic goal for glycemic control related to Hgb A1c measurement: Goal of therapy: <6.5 Hgb A1c  Reference: American Diabetes Association: Clinical Practice Recommendations 2010, Diabetes Care, 2010, 33: (Suppl  1).   Lab Results  Component Value Date   INSULIN 28.4 (H) 12/26/2020   Lab Results  Component Value Date   TSH 1.540 12/26/2020   Lab Results  Component Value Date   CHOL 181 12/26/2020   HDL 40 12/26/2020   LDLCALC 122 (H) 12/26/2020   TRIG 106 12/26/2020   CHOLHDL 3.7 05/31/2018   Lab Results  Component Value Date   WBC 7.9 12/26/2020   HGB 13.4 12/26/2020   HCT 41.8 12/26/2020   MCV 87 12/26/2020   PLT 398.0 11/26/2020    Attestation Statements:   Reviewed by clinician on day of visit: allergies, medications, problem list, medical history, surgical history, family history, social history, and previous encounter notes.  Coral Ceo, am acting as transcriptionist for Coralie Common, MD.   This is the patient's first visit at [facility]. The patient's NEW PATIENT PACKET was reviewed at length. Included in the packet: current and past health history, medications, allergies, ROS,  gynecologic history (women only), surgical history, family history, social history, weight history, weight loss surgery history (for those that have had weight loss surgery), nutritional evaluation, mood and food questionnaire, PHQ9, Epworth questionnaire, sleep habits questionnaire, patient life and health improvement goals questionnaire. These will all be scanned into the patient's chart under media.   During the visit, I independently reviewed the patient's EKG, bioimpedance scale results, and indirect calorimeter results. I used this information to tailor a meal plan for the patient that will help her to lose weight and will improve her obesity-related conditions going forward. I performed a medically necessary appropriate examination and/or evaluation. I discussed the assessment and treatment plan with the patient. The patient was provided an opportunity to ask questions and all were answered. The patient agreed with the plan and demonstrated an understanding of  the instructions. Labs were ordered at this visit and will be reviewed at the next visit unless more critical results need to be addressed immediately. Clinical information was updated and documented in the EMR.   Time spent on visit including pre-visit chart review and post-visit care was 45 minutes.   A separate 15 minutes was spent on risk counseling (see above).  I have reviewed the above documentation for accuracy and completeness, and I agree with the above. - Jinny Blossom, MD

## 2021-01-01 ENCOUNTER — Ambulatory Visit: Payer: 59 | Admitting: Physician Assistant

## 2021-01-09 ENCOUNTER — Ambulatory Visit
Admission: EM | Admit: 2021-01-09 | Discharge: 2021-01-09 | Disposition: A | Discharge status: Home / Self Care | Payer: 59 | Attending: Family Medicine | Admitting: Family Medicine

## 2021-01-09 ENCOUNTER — Ambulatory Visit (INDEPENDENT_AMBULATORY_CARE_PROVIDER_SITE_OTHER): Payer: 59 | Admitting: Family Medicine

## 2021-01-09 ENCOUNTER — Other Ambulatory Visit: Payer: Self-pay

## 2021-01-09 ENCOUNTER — Encounter: Payer: Self-pay | Admitting: Emergency Medicine

## 2021-01-09 DIAGNOSIS — N3090 Cystitis, unspecified without hematuria: Secondary | ICD-10-CM | POA: Diagnosis present

## 2021-01-09 DIAGNOSIS — B9689 Other specified bacterial agents as the cause of diseases classified elsewhere: Secondary | ICD-10-CM | POA: Diagnosis not present

## 2021-01-09 LAB — POCT URINALYSIS DIP (MANUAL ENTRY)
Bilirubin, UA: NEGATIVE
Blood, UA: NEGATIVE
Glucose, UA: 100 mg/dL — AB
Ketones, POC UA: NEGATIVE mg/dL
Leukocytes, UA: NEGATIVE
Nitrite, UA: POSITIVE — AB
Protein Ur, POC: 30 mg/dL — AB
Spec Grav, UA: >=1.03 — AB (ref 1.010–1.025)
Urobilinogen, UA: 2 E.U./dL — AB
pH, UA: 5 (ref 5.0–8.0)

## 2021-01-09 LAB — POCT URINE PREGNANCY: Preg Test, Ur: NEGATIVE

## 2021-01-09 MED ORDER — SULFAMETHOXAZOLE-TRIMETHOPRIM 800-160 MG PO TABS: 1.0000 | ORAL_TABLET | Freq: Two times a day (BID) | ORAL | 0 refills | Status: AC

## 2021-01-09 NOTE — ED Triage Notes (Signed)
Pt sts dysuria x 2 days with pain in kidneys; pt sts taking azo without relief

## 2021-01-09 NOTE — ED Provider Notes (Signed)
[place]    ASSESSMENT & PLAN:  1. Cystitis    Begin: Meds ordered this encounter  Medications  . sulfamethoxazole-trimethoprim (BACTRIM DS) 800-160 MG tablet    Sig: Take 1 tablet by mouth 2 (two) times daily for 5 days.    Dispense:  10 tablet    Refill:  0   No signs of pyelonephritis. Urine culture sent. Will follow up with her PCP or here if not showing improvement over the next 48 hours, sooner if needed.  Outlined signs and symptoms indicating need for more acute intervention. Patient verbalized understanding. After Visit Summary given.  SUBJECTIVE:  Brandy [name] is a 35 y.o. female who complains of urinary frequency, urgency and dysuria for the past 2 d. Without associated flank pain, fever, chills, vaginal discharge or bleeding. Gross hematuria: not present. Mild lower back/flank discomfort. No specific aggravating or alleviating factors reported. No LE edema. Normal PO intake without n/v/d. Without specific abdominal pain. Ambulatory without difficulty. OTC treatment: AZO without much relief.   OBJECTIVE:  Vitals:   01/09/21 1359  BP: 120/87  Pulse: 98  Resp: 18  Temp: 98.1 F (36.7 C)  TempSrc: Oral  SpO2: 97%   General appearance: alert; no distress Lungs: unlabored respirations Skin: warm and dry Neurologic: normal gait Psychological: alert and cooperative; normal mood and affect  Labs Reviewed  POCT URINALYSIS DIP (MANUAL ENTRY) - Abnormal; Notable for the following components:      Result Value   Color, UA orange (*)    Clarity, UA cloudy (*)    Glucose, UA =100 (*)    Spec Grav, UA >=1.030 (*)    Protein Ur, POC =30 (*)    Urobilinogen, UA 2.0 (*)    Nitrite, UA Positive (*)    All other components within normal limits  URINE CULTURE  POCT URINE PREGNANCY    Allergies  Allergen Reactions  . Metoclopramide Palpitations and Other (See Comments)    Rapid heart rate  . Penicillins Hives, Rash and Other (See Comments)     Childhood reaction Has patient had a PCN reaction causing immediate rash, facial/tongue/throat swelling, SOB or lightheadedness with hypotension: No Has patient had a PCN reaction causing severe rash involving mucus membranes or skin necrosis: No Has patient had a PCN reaction that required hospitalization No Has patient had a PCN reaction occurring within the last 10 years: No If all of the above answers are "NO", then may proceed with Cephalosporin use.  Eduard Roux Other (See Comments)    Other reaction(s): Myalgias (intolerance) Constipation, hair loss  . Fluvoxamine Other (See Comments)    Other reaction(s): Fainted  . Penicillin G Benzathine Other (See Comments)  . Sumatriptan Palpitations  . Zoloft [Sertraline Hcl] Anxiety    Past Medical History:  Diagnosis Date  . Abdominal pain, epigastric 02/18/2017  . Allergy   . Anterior neck pain 12/30/2015  . Anxiety   . Anxiety associated with birthing process 06/14/2018  . B12 deficiency 07/21/2016  . Back pain   . Chest pain   . Chronic mixed headache syndrome 06/14/2018  . COVID-19 virus infection 03/2020   with infusion   . Depression   . DUB (dysfunctional uterine bleeding) 06/04/2011  . Dysphagia 07/20/2018  . Enlarged thyroid gland 12/30/2015  . Fatigue 12/30/2015  . Gallstones   . Generalized anxiety disorder 06/09/2016  . GERD (gastroesophageal reflux disease) 02/19/2011  . Headache, unspecified headache type 03/05/2018  . History of nephrolithiasis   . Kidney stone   .  Medication overuse headache 06/14/2018  . Menorrhagia 11/09/2018  . Migraine   . Missed abortion 11/09/2018  . Nausea vomiting and diarrhea 12/28/2013  . Nausea without vomiting 02/18/2017  . NEPHROLITHIASIS, HX OF 12/16/2007   Qualifier: Diagnosis of  By: Sherren Mocha MD, Jory Ee   . Other fatigue   . Palpitations 03/21/2018  . Pituitary tumor   . 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 Precordial pain 03/21/2018  . Prediabetes   . PVC's (premature  ventricular contractions)   . Routine general medical examination at a health care facility 09/27/2014  . Serum calcium elevated 12/28/2013  . SOB (shortness of breath) on exertion   . SVD (spontaneous vaginal delivery) 02/02/2018  . Tendinitis of right ankle 08/14/2014  . 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 Vitamin D deficiency    Social History   Socioeconomic History  . Marital status: Married    Spouse name: Jonathon  . Number of children: 1  . Years of education: Not on file  . Highest education level: Bachelor's degree (e.g., BA, AB, BS)  Occupational History  . Occupation: customer care representative  Tobacco Use  . Smoking status: Never Smoker  . Smokeless tobacco: Never Used  Vaping Use  . Vaping Use: Never used  Substance and Sexual Activity  . Alcohol use: Not Currently  . Drug use: No  . Sexual activity: Yes  Other Topics Concern  . Not on file  Social History Narrative   Patient is right-handed. She lives with her husband and child in a split-level home.    Daughter born 01/2018   She avoids caffeine.    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 Diabetes Mother   . Mental illness Mother   . Asthma Mother   . Stomach cancer Mother   . Colon polyps Mother   . Heart disease Mother   . Hypertension Mother   . Arthritis Mother   . Thyroid disease Mother   . Cancer Mother   . Anxiety disorder Mother   . Depression Mother   . Obesity Mother   . Diabetes Father   . Hypertension Father   . Hyperlipidemia Father   . Cancer Father   . Sleep apnea Father   . Anxiety disorder  Sister   . Colon cancer Maternal Grandmother   . Diabetes Paternal Grandmother   . Breast cancer Maternal Aunt   . Lung cancer Maternal Aunt   . Diabetes Paternal Aunt   . Diabetes Maternal Uncle   . Diabetes Maternal Uncle   . Esophageal cancer Neg Hx   . Rectal cancer Neg Hx        Vanessa Kick, MD 01/09/21 [phone]

## 2021-01-11 LAB — URINE CULTURE

## 2021-02-11 ENCOUNTER — Encounter: Payer: 59 | Admitting: Internal Medicine

## 2021-02-13 ENCOUNTER — Telehealth: Payer: Self-pay

## 2021-02-13 NOTE — Telephone Encounter (Signed)
In during [place] chart work up it was noted that Pt already had endo/colon with another practice 11/2020- need to see her if she wants to cancel or proceed.  Attempted to reach pt without success - [facility]

## 2021-02-18 NOTE — Telephone Encounter (Signed)
Attempted to reach pt to discuss intentions with having procedures with [facility] since pt had procedures w/another practice in April 2022.  Call directed to VM.  Message left to call back with her plans.

## 2021-02-18 NOTE — Progress Notes (Deleted)
NEUROLOGY FOLLOW UP OFFICE NOTE  Brandy [name] [id number]  Assessment/Plan:    Sudden memory loss, dizziness, unsteady gait, clumsiness - ***   Subjective:  Brandy [name] is a 35 year old right-handed woman with depression, anxiety and history of kidney stones who follows up for memory loss, balance problems.   UPDATE: MRI of brain with and without contrast on 08/26/2020 personally reviewed was normal.  Labs from April included B12 290, folate 2.2, TSH 1.540, T3 170, T4 10.1, D 25-hydroxy 44.1     Current NSAIDS:  no Current analgesics:  no Current triptans:  no Current ergotamine:  no Current anti-emetic:  none Current muscle relaxants:  no Current anti-anxiolytic:  clonazepam PRN Current sleep aide:  no Current Antihypertensive medications:  none Current Antidepressant medications: no Current Anticonvulsant medications:  no Current anti-CGRP: none Current Vitamins/Herbal/Supplements:  no Current Antihistamines/Decongestants:  no Other therapy:  no Other medication:  no       HISTORY: I  Migraines: Onset:  April 2019, during first trimester of first pregnancy.  She had her child 02/02/18.  They have been worse since the birth.  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  Eye exam was normal.  Labs/hormones were unremarkable.  Repeat imaging since birth of her child has shown that it has decreased in size.  MRA of head and neck from 01/05/18 personally reviewed and was negative.  MRI of brain with and without contrast from 03/07/18 was personally reviewed and demonstrated known pituitary adenoma but otherwise no acute process.  MRV of head performed the same day was negative for dural sinus thrombosis.  MRI of brain with and without contrast from 06/17/18 showed no acute findings associated with intracranial hypotension.  12mm pituitary gland stable and stated to be within normal limits for age and gender.  MRI of cervical spine with and without contrast  showed no abnormal fluid collection or enhancement.  Therefore, CSF leak was no longer suspected and she did not receive a blood patch. Location:  Left sided, left retro-orbital/temporal/occipital, but now bilateral Quality:  Pressure, "weird sensations throughout head" Intensity:  Severe.  She denies thunderclap headache. Aura:  no Prodrome:  no Postdrome:  no Associated symptoms:  Nausea, vomiting, bilateral eye lacrimation.  She denies associated, photophobia, phonophobia, unilateral numbness or weakness. Duration:  Often wakes up with it.  Off and on throughout the day. Frequency:  daily Frequency of abortive medication: none Triggers:  no Exacerbating factors:  Standing up, exertion Relieving factors:  Laying down Activity:  aggravates   II Myalgias/Muscle twitching: She developed myalgias after the first dose of Aimovig in August 2019.  She never continued the Aimovig after the first dose and symptoms persist.  ANA, CRP, TSH, B12, K+, CRP, CK and aldolase were unremarkable.  However, she started experiencing muscle twitches on and off for 2-3 months.  No cramps.  It involves the face, back, arms, legs and mostly in thighs.  She also reports increased weakness in the arms and legs.  Pain is worse, described as an aching but not cramping.  Any activity aggravates it.  She feels fatigue in general.  Denies difficulty speaking, swallowing, breathing or change in bowel or bladder control.  She is sleeping well since starting Remeron.  She reports anxiety due to her symptoms.  Repeat labs from May 2020 showed normal CMP, CK 42, and normal Mg of 2.  NCV-EMG of right upper and lower extremities on 03/30/2019 was normal without evidence of  myopathy or other abnormality.   III Dizziness & Memory Problems: She had COVID at the end of July 2021 with double pneumonia.  She recovered and was doing well until October when she got up and started to feel dizzy.  She has felt dizzy since.  It is a feeling of  off-balance, not really spinning or lightheadedness.  She has prior history of dizziness and symptomatic PVCs.  She also started dropping objects.  Last month, she had a sinus infection and was prescribed Zpak and prednisone but had a side effect.  About 2 weeks ago, she developed fairly sudden onset short-term memory loss.  She quickly forgets conversations and word-finding difficulty.  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  Thyroid panel was normal.     Past NSAIDS:  Ibuprofen, naproxen, Cambia Past analgesics:  Fioricet (ineffective), Excedrin Migraine, Tylenol, Ultracet Past abortive triptans:  Sumatriptan tablet (made her feel awful, palpitations) Past abortive ergotamine:  no Past muscle relaxants:   baclofen, Flexeril Past anti-emetic:  promethazine, Zofran Past antihypertensive medications:  metoprolol Past antidepressant medications:  Cymbalta, Effexor, fluoxetine, sertraline, Wellbutrin, citalopram, Lexapro, mirtazapine Past anticonvulsant medications:  lamogrigine Past anti-CGRP:  Aimovig 70mg  (reported various somatic complaints following first dose such as nausea, constipation, arthralgias, myalgias, pain, hair loss and fatigue. She never continued the Aimovig after the first dose and symptoms persist.   Past vitamins/Herbal/Supplements:  no Past antihistamines/decongestants:  Benadryl Other past therapies:  none   Family history of headache:  no  PAST MEDICAL HISTORY: Past Medical History:  Diagnosis Date   Abdominal pain, epigastric 02/18/2017   Allergy    Anterior neck pain 12/30/2015   Anxiety    Anxiety associated with birthing process 06/14/2018   B12 deficiency 07/21/2016   Back pain    Chest pain    Chronic mixed headache syndrome 06/14/2018   COVID-19 virus infection 03/2020   with infusion    Depression    DUB (dysfunctional uterine bleeding) 06/04/2011   Dysphagia 07/20/2018   Enlarged thyroid gland 12/30/2015   Fatigue  12/30/2015   Gallstones    Generalized anxiety disorder 06/09/2016   GERD (gastroesophageal reflux disease) 02/19/2011   Headache, unspecified headache type 03/05/2018   History of nephrolithiasis    Kidney stone    Medication overuse headache 06/14/2018   Menorrhagia 11/09/2018   Migraine    Missed abortion 11/09/2018   Nausea vomiting and diarrhea 12/28/2013   Nausea without vomiting 02/18/2017   NEPHROLITHIASIS, HX OF 12/16/2007   Qualifier: Diagnosis of  By: Sherren Mocha MD, Jory Ee    Other fatigue    Palpitations 03/21/2018   Pituitary tumor    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   Precordial pain 03/21/2018   Prediabetes    PVC's (premature ventricular contractions)    Routine general medical examination at a health care facility 09/27/2014   Serum calcium elevated 12/28/2013   SOB (shortness of breath) on exertion    SVD (spontaneous vaginal delivery) 02/02/2018   Tendinitis of right ankle 08/14/2014   TMJ syndrome 05/30/2012   Vitamin D deficiency     MEDICATIONS: Current Outpatient Medications on File Prior to Visit  Medication Sig Dispense Refill   acetaminophen (TYLENOL) 500 MG tablet Take 1,000 mg by mouth every 6 (six) hours as needed for mild pain or headache.      Cholecalciferol (VITAMIN D) 2000 units CAPS Take 2,000 Units by mouth daily.     clonazePAM (KLONOPIN) 0.5 MG tablet Take  1 tablet (0.5 mg total) by mouth 2 (two) times daily as needed for anxiety. 60 tablet 3   esomeprazole (NEXIUM) 40 MG capsule TAKE 1 CAPSULE (40 MG TOTAL) BY MOUTH 2 (TWO) TIMES DAILY BEFORE A MEAL. 180 capsule 1   FLUoxetine (PROZAC) 20 MG capsule Take 1 capsule (20 mg total) by mouth daily. 30 capsule 1   magnesium oxide (MAG-OX) 400 MG tablet Take 1 tablet by mouth daily.     Omega-3 Fatty Acids (FISH OIL) 1000 MG CAPS Take by mouth.     ondansetron (ZOFRAN ODT) 8 MG disintegrating tablet Take 1 tablet (8 mg total) by mouth every 8 (eight) hours as needed for nausea or vomiting. 20 tablet 0    Probiotic Product (PROBIOTIC PO) Take 1 capsule by mouth daily.     propranolol (INDERAL) 20 MG tablet Take 1 tablet (20 mg total) by mouth every 8 (eight) hours. 180 tablet 5   Turmeric 500 MG CAPS Take by mouth.     [DISCONTINUED] DULoxetine (CYMBALTA) 20 MG capsule Take 1 capsule (20 mg total) by mouth daily. (Patient not taking: Reported on 03/20/2019) 30 capsule 1   No current facility-administered medications on file prior to visit.    ALLERGIES: Allergies  Allergen Reactions   Metoclopramide Palpitations and Other (See Comments)    Rapid heart rate   Penicillins Hives, Rash and Other (See Comments)    Childhood reaction Has patient had a PCN reaction causing immediate rash, facial/tongue/throat swelling, SOB or lightheadedness with hypotension: No Has patient had a PCN reaction causing severe rash involving mucus membranes or skin necrosis: No Has patient had a PCN reaction that required hospitalization No Has patient had a PCN reaction occurring within the last 10 years: No If all of the above answers are "NO", then may proceed with Cephalosporin use.   Erenumab-Aooe Other (See Comments)    Other reaction(s): Myalgias (intolerance) Constipation, hair loss   Fluvoxamine Other (See Comments)    Other reaction(s): Fainted   Penicillin G Benzathine Other (See Comments)   Sumatriptan Palpitations   Zoloft [Sertraline Hcl] Anxiety    FAMILY HISTORY: Family History  Problem Relation Age of Onset   Diabetes Mother    Mental illness Mother    Asthma Mother    Stomach cancer Mother    Colon polyps Mother    Heart disease Mother    Hypertension Mother    Arthritis Mother    Thyroid disease Mother    Cancer Mother    Anxiety disorder Mother    Depression Mother    Obesity Mother    Diabetes Father    Hypertension Father    Hyperlipidemia Father    Cancer Father    Sleep apnea Father    Anxiety disorder Sister    Colon cancer Maternal Grandmother    Diabetes Paternal  Grandmother    Breast cancer Maternal Aunt    Lung cancer Maternal Aunt    Diabetes Paternal Aunt    Diabetes Maternal Uncle    Diabetes Maternal Uncle    Esophageal cancer Neg Hx    Rectal cancer Neg Hx       Objective:   General: No acute distress.  Patient appears ***-groomed.   Head:  Normocephalic/atraumatic Eyes:  Fundi examined but not visualized Neck: supple, no paraspinal tenderness, full range of motion Heart:  Regular rate and rhythm Lungs:  Clear to auscultation bilaterally Back: No paraspinal tenderness Neurological Exam: alert and oriented to person, place, and time. Attention  span and concentration intact, recent and remote memory intact, fund of knowledge intact.  Speech fluent and not dysarthric, language intact.  CN II-XII intact. Bulk and tone normal, muscle strength 5/5 throughout.  Sensation to light touch, temperature and vibration intact.  Deep tendon reflexes 2+ throughout, toes downgoing.  Finger to nose and heel to shin testing intact.  Gait normal, Romberg negative.     [name], DO  CC:

## 2021-02-19 ENCOUNTER — Ambulatory Visit: Payer: 59 | Admitting: Neurology

## 2021-02-19 NOTE — Telephone Encounter (Signed)
If patient returns call or comes to [facility],  pt will need an OV before moving forward with scheduled procedures.

## 2021-03-06 ENCOUNTER — Encounter: Payer: 59 | Admitting: Internal Medicine

## 2021-06-23 ENCOUNTER — Ambulatory Visit (INDEPENDENT_AMBULATORY_CARE_PROVIDER_SITE_OTHER): Payer: 59 | Admitting: Cardiology

## 2021-06-23 ENCOUNTER — Other Ambulatory Visit: Payer: Self-pay

## 2021-06-23 ENCOUNTER — Encounter: Payer: Self-pay | Admitting: Cardiology

## 2021-06-23 ENCOUNTER — Telehealth: Payer: Self-pay | Admitting: Cardiology

## 2021-06-23 VITALS — BP 118/82 | HR 82 | Ht 68.5 in | Wt 253.0 lb

## 2021-06-23 DIAGNOSIS — R5383 Other fatigue: Secondary | ICD-10-CM | POA: Diagnosis not present

## 2021-06-23 DIAGNOSIS — E669 Obesity, unspecified: Secondary | ICD-10-CM

## 2021-06-23 DIAGNOSIS — R0789 Other chest pain: Secondary | ICD-10-CM | POA: Diagnosis not present

## 2021-06-23 DIAGNOSIS — I493 Ventricular premature depolarization: Secondary | ICD-10-CM

## 2021-06-23 MED ORDER — NITROGLYCERIN 0.4 MG SL SUBL: 0.4000 mg | SUBLINGUAL_TABLET | SUBLINGUAL | 3 refills | Status: DC | PRN

## 2021-06-23 MED ORDER — METOPROLOL TARTRATE 100 MG PO TABS: ORAL_TABLET | ORAL | 0 refills | Status: DC

## 2021-06-23 NOTE — Telephone Encounter (Signed)
Pt c/o of Chest Pain: STAT if CP now or developed within 24 hours  1. Are you having CP right now?  Not currently  2. Are you experiencing any other symptoms (ex. SOB, nausea, vomiting, sweating)?  Nausea, sweating, weakness   3. How long have you been experiencing CP?  2-3 days   4. Is your CP continuous or coming and going?  Coming and going   5. Have you taken Nitroglycerin?  No   Patient states on Friday, 10/21, night she started feeling really weak.  She went to urgent care and got tested for COVID and flu, but results came back negative.  Soon after results came in, she also developed chest pain, that has been on and off throughout the weekend.  She states it feels like a squeezing and tightness under her left breast and it radiates to her left shoulder blade.  This morning she had chest pain and called EMS. They took an EKG and advised that she follow up with cardiology.  We scheduled her for today, 10/24 at 1:20 PM with Dr. Harriet Masson.

## 2021-06-23 NOTE — Telephone Encounter (Signed)
Left message to call back - appt scheduled for today

## 2021-06-23 NOTE — Patient Instructions (Addendum)
Medication Instructions:  Your physician has recommended you make the following change in your medication:  START: Nitroglycerin 0.4 mg take one tablet by mouth every 5 minutes up to three times as needed for chest pain  *If you need a refill on your cardiac medications before your next appointment, please call your pharmacy*   Lab Work: Your physician recommends that you return for lab work in:  Possibility (if scheduled after 11/21): BMET, Mag If you have labs (blood work) drawn today and your tests are completely normal, you will receive your results only by: [place] (if you have MyChart) OR A paper copy in the mail If you have any lab test that is abnormal or we need to change your treatment, we will call you to review the results.   Testing/Procedures:   Your cardiac CT will be scheduled at one of the below locations:   [facility] [address] [place], [place] [address] [phone]  If scheduled at [facility], please arrive at the [facility] main entrance (entrance A) of [facility] 30 minutes prior to test start time. You can use the FREE valet parking offered at the main entrance (encouraged to control the heart rate for the test) Proceed to the [facility] Radiology Department (first floor) to check-in and test prep.   Please follow these instructions carefully (unless otherwise directed):  On the Night Before the Test: Be sure to Drink plenty of water. Do not consume any caffeinated/decaffeinated beverages or chocolate 12 hours prior to your test. Do not take any antihistamines 12 hours prior to your test.  On the Day of the Test: Drink plenty of water until 1 hour prior to the test. Do not eat any food 4 hours prior to the test. You may take your regular medications prior to the test.  Take metoprolol (Lopressor) two hours prior to test. FEMALES- please wear underwire-free bra if available, avoid dresses & tight  clothing       After the Test: Drink plenty of water. After receiving IV contrast, you may experience a mild flushed feeling. This is normal. On occasion, you may experience a mild rash up to 24 hours after the test. This is not dangerous. If this occurs, you can take Benadryl 25 mg and increase your fluid intake. If you experience trouble breathing, this can be serious. If it is severe call 911 IMMEDIATELY. If it is mild, please call our office. If you take any of these medications: Glipizide/Metformin, Avandament, Glucavance, please do not take 48 hours after completing test unless otherwise instructed.  Please allow 2-4 weeks for scheduling of routine cardiac CTs. Some insurance companies require a pre-authorization which may delay scheduling of this test.   For non-scheduling related questions, please contact the cardiac imaging nurse navigator should you have any questions/concerns: Marchia Bond, Cardiac Imaging Nurse Navigator Gordy Clement, Cardiac Imaging Nurse Navigator [facility] Direct Office Dial: [phone]   For scheduling needs, including cancellations and rescheduling, please call [place], [phone].    Follow-Up: At [facility], you and your health needs are our priority.  As part of our continuing mission to provide you with exceptional heart care, we have created designated Provider Care Teams.  These Care Teams include your primary Cardiologist (physician) and Advanced Practice Providers (APPs -  Physician Assistants and Nurse Practitioners) who all work together to provide you with the care you need, when you need it.  We recommend signing up  for the patient portal called "MyChart".  Sign up information is provided on this After Visit Summary.  MyChart is used to connect with patients for Virtual Visits (Telemedicine).  Patients are able to view lab/test results, encounter notes, upcoming appointments, etc.  Non-urgent messages can  be sent to your provider as well.   To learn more about what you can do with MyChart, go to [email or web address].    Your next appointment:   6 month(s)  The format for your next appointment:   In Person  Provider:   Berniece Salines, DO [address], [place], [place] [address]    Other Instructions

## 2021-06-23 NOTE — Progress Notes (Signed)
Cardiology Office Note:    Date:  06/23/2021   ID:  Brandy [name], DOB 1986-03-20, MRN [id number]  PCP:  Lujean Amel, MD  Cardiologist:  Berniece Salines, DO  Electrophysiologist:  None   Referring MD: Lujean Amel, MD   " I am having chest pain"  History of Present Illness:    Brandy [name] is a 35 y.o. female with a hx of anxiety and postpartum depression.  The patient was initially seen in consultation on May 31, 2018 for complaints of palpitation and shortness of breath.   During her initial consultation it was noted that the patient had seen Dr. Nehemiah Massed at [facility] and had had previous work-up with a normal echocardiogram (July 2019) and a treadmill SPECT stress test (which was also July 2019) which I was able to review in Care Everywhere.  Her Holter August 29 showed sinus tachycardia.  The conclusion at that visit a 48-hour Holter monitor was placed on patient she was encouraged to continue hydration as well as continue treatment for her anxiety.  Review of the Holter monitors did not show predominant sinus arrhythmia with 1 PAC.  There are no ventricular ectopy or no pauses.  Her average heart rate was 82 bpm.   She did continue to have palpitations therefore a longer monitor was placed on patient for 6 days which showed dominant rhythm of sinus rhythm.  Isolated atrial ventricular ectopy.  12 of her triggered events were associated with sinus rhythm, one was normal sinus rhythm with PVC, one was sinus tachycardia with PVC and one was sinus rhythm with PAC.   Her Jan 03, 2019, was a telemetry medicine visit.  At that time it was discussed with the patient to pursue noncardiac work-up.   I did see the patient on the June 22, 2019 at which time she was experiencing worsening palpitations with associated dizziness. I placed a monitor on the patient at that time and I did see her in December 2020 and discussed the result of her monitor showing symptomatic PVCs. Due to low  blood pressure as needed Lopressor 12.5 was given to the patient.   I saw the patient on November 03, 2019 at that time I started her on Toprol-XL 12.5 mg daily she reported that she was having worsening palpitations.   I saw the patient on March 31, 2020 at that time she was status post COVID-19 infection.  She did have problem with hypoxia in was treated for COVID-19 pneumonia.  During that visit she reported that she still experiencing palpitations I increased her Toprol-XL to 12.5 mg twice daily.  She is here today for follow-up visit.  I saw the patient on November 01, 2020 at that time we discussed her echocardiogram which was normal.  I explained to the patient to continue her propanolol.  She is here today for follow-up visit she called to be seen.  She was recently seen in the emergency department at [facility].  She was having epigastric pain and some chest discomfort.  She tells me that she has had worsening shortness of breath on exertion and significant fatigue.  She therefore went to the ED testing which was done/blood test was normal.  She had abdominal CT scan done.  Unfortunately she did not complete the work-up and evaluation and she left the emergency department.  She is here today with her husband she tells me that she is experiencing intermittent chest discomfort and left-sided feel like a squeezing sensation.  She  says that it comes off and on.  Nothing makes it better or worse.  She is nervous about this.  Given her family history of premature coronary artery disease.  Past Medical History:  Diagnosis Date   Abdominal pain, epigastric 02/18/2017   Allergy    Anterior neck pain 12/30/2015   Anxiety    Anxiety associated with birthing process 06/14/2018   B12 deficiency 07/21/2016   Back pain    Chest pain    Chronic mixed headache syndrome 06/14/2018   COVID-19 virus infection 03/2020   with infusion    Depression    DUB (dysfunctional uterine bleeding) 06/04/2011    Dysphagia 07/20/2018   Enlarged thyroid gland 12/30/2015   Fatigue 12/30/2015   Gallstones    Generalized anxiety disorder 06/09/2016   GERD (gastroesophageal reflux disease) 02/19/2011   Headache, unspecified headache type 03/05/2018   History of nephrolithiasis    Kidney stone    Medication overuse headache 06/14/2018   Menorrhagia 11/09/2018   Migraine    Missed abortion 11/09/2018   Nausea vomiting and diarrhea 12/28/2013   Nausea without vomiting 02/18/2017   NEPHROLITHIASIS, HX OF 12/16/2007   Qualifier: Diagnosis of  By: Sherren Mocha MD, Jory Ee    Other fatigue    Palpitations 03/21/2018   Pituitary tumor    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   Precordial pain 03/21/2018   Prediabetes    PVC's (premature ventricular contractions)    Routine general medical examination at a health care facility 09/27/2014   Serum calcium elevated 12/28/2013   SOB (shortness of breath) on exertion    SVD (spontaneous vaginal delivery) 02/02/2018   Tendinitis of right ankle 08/14/2014   TMJ syndrome 05/30/2012   Vitamin D deficiency     Past Surgical History:  Procedure Laterality Date   CHOLECYSTECTOMY  2014   KIDNEY STONE SURGERY     x 5   TYMPANOSTOMY TUBE PLACEMENT     WISDOM TOOTH EXTRACTION      Current Medications: Current Meds  Medication Sig   metoprolol tartrate (LOPRESSOR) 100 MG tablet Take 2 hours prior to CT   nitroGLYCERIN (NITROSTAT) 0.4 MG SL tablet Place 1 tablet (0.4 mg total) under the tongue every 5 (five) minutes as needed for chest pain.     Allergies:   Metoclopramide, Penicillins, Erenumab-aooe, Fluvoxamine, Penicillin g benzathine, Sumatriptan, and Zoloft [sertraline hcl]   Social History   Socioeconomic History   Marital status: Married    Spouse name: Jonathon   Number of children: 1   Years of education: Not on file   Highest education level: Bachelor's degree (e.g., BA, AB, BS)  Occupational History   Occupation: customer care representative  Tobacco Use    Smoking status: Never   Smokeless tobacco: Never  Vaping Use   Vaping Use: Never used  Substance and Sexual Activity   Alcohol use: Not Currently   Drug use: No   Sexual activity: Yes  Other Topics Concern   Not on file  Social History Narrative   Patient is right-handed. She lives with her husband and child in a split-level home.    Daughter born 01/2018   She avoids caffeine.    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   Social Determinants of Health   Financial Resource Strain: Not on file  Food Insecurity: Not on file  Transportation Needs: Not on file  Physical  Activity: Not on file  Stress: Not on file  Social Connections: Not on file     Family History: The patient's family history includes Anxiety disorder in her mother and sister; Arthritis in her mother; Asthma in her mother; Breast cancer in her maternal aunt; Cancer in her father and mother; Colon cancer in her maternal grandmother; Colon polyps in her mother; Depression in her mother; Diabetes in her father, maternal uncle, maternal uncle, mother, paternal aunt, and paternal grandmother; Heart disease in her mother; Hyperlipidemia in her father; Hypertension in her father and mother; Lung cancer in her maternal aunt; Mental illness in her mother; Obesity in her mother; Sleep apnea in her father; Stomach cancer in her mother; Thyroid disease in her mother. There is no history of Esophageal cancer or Rectal cancer.  ROS:   Review of Systems  Constitution: Negative for decreased appetite, fever and weight gain.  HENT: Negative for congestion, ear discharge, hoarse voice and sore throat.   Eyes: Negative for discharge, redness, vision loss in right eye and visual halos.  Cardiovascular: Negative for chest pain, dyspnea on exertion, leg swelling, orthopnea and palpitations.  Respiratory: Negative for cough, hemoptysis, shortness of breath and  snoring.   Endocrine: Negative for heat intolerance and polyphagia.  Hematologic/Lymphatic: Negative for bleeding problem. Does not bruise/bleed easily.  Skin: Negative for flushing, nail changes, rash and suspicious lesions.  Musculoskeletal: Negative for arthritis, joint pain, muscle cramps, myalgias, neck pain and stiffness.  Gastrointestinal: Negative for abdominal pain, bowel incontinence, diarrhea and excessive appetite.  Genitourinary: Negative for decreased libido, genital sores and incomplete emptying.  Neurological: Negative for brief paralysis, focal weakness, headaches and loss of balance.  Psychiatric/Behavioral: Negative for altered mental status, depression and suicidal ideas.  Allergic/Immunologic: Negative for HIV exposure and persistent infections.    EKGs/Labs/Other Studies Reviewed:    The following studies were reviewed today:   EKG:  The ekg ordered today demonstrates sinus rhythm, heart rate 82 bpm.  Echo 2022 IMPRESSIONS   1. Left ventricular ejection fraction, by estimation, is 55 to 60%. The  left ventricle has normal function. The left ventricle has no regional  wall motion abnormalities. Left ventricular diastolic parameters were  normal.   2. Right ventricular systolic function is normal. The right ventricular  size is normal. There is normal pulmonary artery systolic pressure.   3. The mitral valve is normal in structure. No evidence of mitral valve  regurgitation. No evidence of mitral stenosis.   4. The aortic valve is normal in structure. Aortic valve regurgitation is  not visualized. No aortic stenosis is present.   5. The inferior vena cava is normal in size with greater than 50%  respiratory variability, suggesting right atrial pressure of 3 mmHg.   FINDINGS   Left Ventricle: Left ventricular ejection fraction, by estimation, is 55  to 60%. The left ventricle has normal function. The left ventricle has no  regional wall motion abnormalities. The  left ventricular internal cavity  size was normal in size. There is   no left ventricular hypertrophy. Left ventricular diastolic parameters  were normal.   Right Ventricle: The right ventricular size is normal. No increase in  right ventricular wall thickness. Right ventricular systolic function is  normal. There is normal pulmonary artery systolic pressure. The tricuspid  regurgitant velocity is 2.17 m/s, and   with an assumed right atrial pressure of 3 mmHg, the estimated right  ventricular systolic pressure is 26.3 mmHg.   Left Atrium: Left atrial size  was normal in size.   Right Atrium: Right atrial size was normal in size.   Pericardium: There is no evidence of pericardial effusion.   Mitral Valve: The mitral valve is normal in structure. No evidence of  mitral valve regurgitation. No evidence of mitral valve stenosis.   Tricuspid Valve: The tricuspid valve is normal in structure. Tricuspid  valve regurgitation is trivial. No evidence of tricuspid stenosis.   Aortic Valve: The aortic valve is normal in structure. Aortic valve  regurgitation is not visualized. No aortic stenosis is present.   Pulmonic Valve: The pulmonic valve was normal in structure. Pulmonic valve  regurgitation is not visualized. No evidence of pulmonic stenosis.   Aorta: The aortic root is normal in size and structure.   Venous: The inferior vena cava is normal in size with greater than 50%  respiratory variability, suggesting right atrial pressure of 3 mmHg.   IAS/Shunts: No atrial level shunt detected by color flow Doppler.   06/23/2019 FINDINGS: Non-cardiac: See separate report from [facility].   Ascending Aorta: Normal size, no calcifications.   Pericardium: Normal.   Coronary arteries: Normal origin.   IMPRESSION: Coronary calcium score of 0. This was 0 percentile for age and sex matched control.  Recent Labs: 07/26/2020: B Natriuretic Peptide 22.7 11/26/2020: Platelets  398.0 12/26/2020: ALT 24; BUN 13; Creatinine, Ser 1.06; Hemoglobin 13.4; Potassium 4.7; Sodium 140; TSH 1.540  Recent Lipid Panel    Component Value Date/Time   CHOL 181 12/26/2020 0847   TRIG 106 12/26/2020 0847   HDL 40 12/26/2020 0847   CHOLHDL 3.7 05/31/2018 1023   CHOLHDL 3 09/24/2014 0837   VLDL 9.6 09/24/2014 0837   LDLCALC 122 (H) 12/26/2020 0847    Physical Exam:    VS:  BP 118/82   Pulse 82   Ht 5' 8.5" (1.74 m)   Wt 253 lb (114.8 kg)   SpO2 98%   BMI 37.91 kg/m     Wt Readings from Last 3 Encounters:  06/23/21 253 lb (114.8 kg)  12/26/20 242 lb (109.8 kg)  11/26/20 243 lb (110.2 kg)     GEN: Well nourished, well developed in no acute distress HEENT: Normal NECK: No JVD; No carotid bruits LYMPHATICS: No lymphadenopathy CARDIAC: S1S2 noted,RRR, no murmurs, rubs, gallops RESPIRATORY:  Clear to auscultation without rales, wheezing or rhonchi  ABDOMEN: Soft, non-tender, non-distended, +bowel sounds, no guarding. EXTREMITIES: No edema, No cyanosis, no clubbing MUSCULOSKELETAL:  No deformity  SKIN: Warm and dry NEUROLOGIC:  Alert and oriented x 3, non-focal PSYCHIATRIC:  Normal affect, good insight  ASSESSMENT:    1. Other chest pain   2. Symptomatic PVCs   3. Obesity (BMI 30-39.9)   4. Other fatigue    PLAN:     The symptoms chest pain is concerning, this patient does have risk for coronary artery disease with her family history as well and at this time I would like to pursue an ischemic evaluation in this patient.  Shared decision a coronary CTA at this time is appropriate.  I have discussed with the patient about the testing.  The patient has no IV contrast allergy and is agreeable to proceed with this test.  Sublingual nitroglycerin prescription was sent, its protocol and 911 protocol explained and the patient vocalized understanding questions were answered to the patient's satisfaction.  She has not had any palpitations therefore had not been taking  the propanolol.  She takes this as needed.  The patient understands the need to lose  weight with diet and exercise. We have discussed specific strategies for this.  I do think the patient should be evaluated by rheumatology for her significant fatigue.  I have expressed this to the patient she is going to discuss with her PCP about this.  The patient is in agreement with the above plan. The patient left the office in stable condition.  The patient will follow up in 6 months or sooner if needed.   Medication Adjustments/Labs and Tests Ordered: Current medicines are reviewed at length with the patient today.  Concerns regarding medicines are outlined above.  Orders Placed This Encounter  Procedures   CT CORONARY MORPH W/CTA COR W/SCORE W/CA W/CM &/OR WO/CM   Magnesium   Basic metabolic panel   EKG 76-HYWV    Meds ordered this encounter  Medications   nitroGLYCERIN (NITROSTAT) 0.4 MG SL tablet    Sig: Place 1 tablet (0.4 mg total) under the tongue every 5 (five) minutes as needed for chest pain.    Dispense:  90 tablet    Refill:  3   metoprolol tartrate (LOPRESSOR) 100 MG tablet    Sig: Take 2 hours prior to CT    Dispense:  1 tablet    Refill:  0     Patient Instructions  Medication Instructions:  Your physician has recommended you make the following change in your medication:  START: Nitroglycerin 0.4 mg take one tablet by mouth every 5 minutes up to three times as needed for chest pain  *If you need a refill on your cardiac medications before your next appointment, please call your pharmacy*   Lab Work: Your physician recommends that you return for lab work in:  Possibility (if scheduled after 11/21): BMET, Mag If you have labs (blood work) drawn today and your tests are completely normal, you will receive your results only by: [place] (if you have MyChart) OR A paper copy in the mail If you have any lab test that is abnormal or we need to change your treatment,  we will call you to review the results.   Testing/Procedures:   Your cardiac CT will be scheduled at one of the below locations:   [facility] [address] [place], [place] [address] [phone]  If scheduled at [facility], please arrive at the [facility] main entrance (entrance A) of [facility] 30 minutes prior to test start time. You can use the FREE valet parking offered at the main entrance (encouraged to control the heart rate for the test) Proceed to the [facility] Radiology Department (first floor) to check-in and test prep.   Please follow these instructions carefully (unless otherwise directed):  On the Night Before the Test: Be sure to Drink plenty of water. Do not consume any caffeinated/decaffeinated beverages or chocolate 12 hours prior to your test. Do not take any antihistamines 12 hours prior to your test.  On the Day of the Test: Drink plenty of water until 1 hour prior to the test. Do not eat any food 4 hours prior to the test. You may take your regular medications prior to the test.  Take metoprolol (Lopressor) two hours prior to test. FEMALES- please wear underwire-free bra if available, avoid dresses & tight clothing       After the Test: Drink plenty of water. After receiving IV contrast, you may experience a mild flushed feeling. This is normal. On occasion, you may experience a mild rash up to 24 hours after  the test. This is not dangerous. If this occurs, you can take Benadryl 25 mg and increase your fluid intake. If you experience trouble breathing, this can be serious. If it is severe call 911 IMMEDIATELY. If it is mild, please call our office. If you take any of these medications: Glipizide/Metformin, Avandament, Glucavance, please do not take 48 hours after completing test unless otherwise instructed.  Please allow 2-4 weeks for scheduling of routine cardiac CTs. Some insurance companies require a  pre-authorization which may delay scheduling of this test.   For non-scheduling related questions, please contact the cardiac imaging nurse navigator should you have any questions/concerns: Marchia Bond, Cardiac Imaging Nurse Navigator Gordy Clement, Cardiac Imaging Nurse Navigator [facility] Direct Office Dial: [phone]   For scheduling needs, including cancellations and rescheduling, please call [place], [phone].    Follow-Up: At [facility], you and your health needs are our priority.  As part of our continuing mission to provide you with exceptional heart care, we have created designated Provider Care Teams.  These Care Teams include your primary Cardiologist (physician) and Advanced Practice Providers (APPs -  Physician Assistants and Nurse Practitioners) who all work together to provide you with the care you need, when you need it.  We recommend signing up for the patient portal called "MyChart".  Sign up information is provided on this After Visit Summary.  MyChart is used to connect with patients for Virtual Visits (Telemedicine).  Patients are able to view lab/test results, encounter notes, upcoming appointments, etc.  Non-urgent messages can be sent to your provider as well.   To learn more about what you can do with MyChart, go to [email or web address].    Your next appointment:   6 month(s)  The format for your next appointment:   In Person  Provider:   Berniece Salines, DO [address], [place], [place] [address]    Other Instructions   Adopting a Healthy Lifestyle.  Know what a healthy weight is for you (roughly BMI <25) and aim to maintain this   Aim for 7+ servings of fruits and vegetables daily   65-80+ fluid ounces of water or unsweet tea for healthy kidneys   Limit to max 1 drink of alcohol per day; avoid smoking/tobacco   Limit animal fats in diet for cholesterol and heart health - choose grass fed whenever  available   Avoid highly processed foods, and foods high in saturated/trans fats   Aim for low stress - take time to unwind and care for your mental health   Aim for 150 min of moderate intensity exercise weekly for heart health, and weights twice weekly for bone health   Aim for 7-9 hours of sleep daily   When it comes to diets, agreement about the perfect plan isnt easy to find, even among the experts. Experts at the [employer] developed an idea known as the Healthy Eating Plate. Just imagine a plate divided into logical, healthy portions.   The emphasis is on diet quality:   Load up on vegetables and fruits - one-half of your plate: Aim for color and variety, and remember that potatoes dont count.   Go for whole grains - one-quarter of your plate: Whole wheat, barley, wheat berries, quinoa, oats, brown rice, and foods made with them. If you want pasta, go with whole wheat pasta.   Protein power - one-quarter of your plate: Fish, chicken, beans, and nuts are all healthy, versatile protein sources.  Limit red meat.   The diet, however, does go beyond the plate, offering a few other suggestions.   Use healthy plant oils, such as olive, canola, soy, corn, sunflower and peanut. Check the labels, and avoid partially hydrogenated oil, which have unhealthy trans fats.   If youre thirsty, drink water. Coffee and tea are good in moderation, but skip sugary drinks and limit milk and dairy products to one or two daily servings.   The type of carbohydrate in the diet is more important than the amount. Some sources of carbohydrates, such as vegetables, fruits, whole grains, and beans-are healthier than others.   Finally, stay active  Signed, Berniece Salines, DO  06/23/2021 4:52 PM    [facility]

## 2021-07-01 ENCOUNTER — Telehealth (HOSPITAL_COMMUNITY): Payer: Self-pay | Admitting: *Deleted

## 2021-07-01 NOTE — Telephone Encounter (Signed)
Attempted to call patient regarding upcoming cardiac CT appointment. °Left message on voicemail with name and callback number ° °Rashea Hoskie RN Navigator Cardiac Imaging ° Heart and V[facility] Services °[phone] Office °[phone] Cell ° °

## 2021-07-02 ENCOUNTER — Other Ambulatory Visit: Payer: Self-pay

## 2021-07-02 ENCOUNTER — Encounter (HOSPITAL_COMMUNITY): Payer: Self-pay

## 2021-07-02 ENCOUNTER — Ambulatory Visit (HOSPITAL_COMMUNITY)
Admission: RE | Admit: 2021-07-02 | Discharge: 2021-07-02 | Disposition: A | Discharge status: Home / Self Care | Payer: 59 | Source: Ambulatory Visit | Attending: Cardiology | Admitting: Cardiology

## 2021-07-02 DIAGNOSIS — R0789 Other chest pain: Secondary | ICD-10-CM | POA: Insufficient documentation

## 2021-07-02 MED ORDER — NITROGLYCERIN 0.4 MG SL SUBL
SUBLINGUAL_TABLET | SUBLINGUAL | Status: AC
  Administered 2021-07-02: 0.8 mg via SUBLINGUAL
  Filled 2021-07-02: qty 2

## 2021-07-02 MED ORDER — METOPROLOL TARTRATE 5 MG/5ML IV SOLN: 10.0000 mg | INTRAVENOUS | Status: DC | PRN

## 2021-07-02 MED ORDER — IOHEXOL 350 MG/ML SOLN
95.0000 mL | Freq: Once | INTRAVENOUS | Status: AC | PRN
  Administered 2021-07-02: 95 mL via INTRAVENOUS

## 2021-07-02 MED ORDER — NITROGLYCERIN 0.4 MG SL SUBL: 0.8000 mg | SUBLINGUAL_TABLET | Freq: Once | SUBLINGUAL | Status: AC

## 2021-07-02 MED ORDER — DILTIAZEM HCL 25 MG/5ML IV SOLN
INTRAVENOUS | Status: AC
  Administered 2021-07-02: 10 mg via INTRAVENOUS
  Filled 2021-07-02: qty 5

## 2021-07-02 MED ORDER — MIDAZOLAM HCL 2 MG/2ML IJ SOLN
INTRAMUSCULAR | Status: AC
  Filled 2021-07-02: qty 2

## 2021-07-02 MED ORDER — METOPROLOL TARTRATE 5 MG/5ML IV SOLN
INTRAVENOUS | Status: AC
  Filled 2021-07-02: qty 20

## 2021-07-02 MED ORDER — DILTIAZEM HCL 25 MG/5ML IV SOLN
10.0000 mg | INTRAVENOUS | Status: DC | PRN
  Administered 2021-07-02: 5 mg via INTRAVENOUS

## 2021-07-29 ENCOUNTER — Other Ambulatory Visit: Payer: Self-pay

## 2021-07-29 ENCOUNTER — Emergency Department (HOSPITAL_COMMUNITY): Payer: Managed Care, Other (non HMO)

## 2021-07-29 ENCOUNTER — Emergency Department (HOSPITAL_COMMUNITY)
Admission: EM | Admit: 2021-07-29 | Discharge: 2021-07-29 | Disposition: A | Discharge status: Home / Self Care | Payer: Managed Care, Other (non HMO) | Attending: Student | Admitting: Student

## 2021-07-29 ENCOUNTER — Encounter (HOSPITAL_COMMUNITY): Payer: Self-pay | Admitting: *Deleted

## 2021-07-29 DIAGNOSIS — R112 Nausea with vomiting, unspecified: Secondary | ICD-10-CM | POA: Insufficient documentation

## 2021-07-29 DIAGNOSIS — Z5321 Procedure and treatment not carried out due to patient leaving prior to being seen by health care provider: Secondary | ICD-10-CM | POA: Diagnosis not present

## 2021-07-29 DIAGNOSIS — M791 Myalgia, unspecified site: Secondary | ICD-10-CM | POA: Insufficient documentation

## 2021-07-29 DIAGNOSIS — R509 Fever, unspecified: Secondary | ICD-10-CM | POA: Diagnosis not present

## 2021-07-29 DIAGNOSIS — Z20822 Contact with and (suspected) exposure to covid-19: Secondary | ICD-10-CM | POA: Insufficient documentation

## 2021-07-29 LAB — RESP PANEL BY RT-PCR (FLU A&B, COVID) ARPGX2
Influenza A by PCR: NEGATIVE
Influenza B by PCR: NEGATIVE
SARS Coronavirus 2 by RT PCR: NEGATIVE

## 2021-07-29 LAB — CBC WITH DIFFERENTIAL/PLATELET
Abs Immature Granulocytes: 0.04 10*3/uL (ref 0.00–0.07)
Basophils Absolute: 0.1 10*3/uL (ref 0.0–0.1)
Basophils Relative: 1 %
Eosinophils Absolute: 0.1 10*3/uL (ref 0.0–0.5)
Eosinophils Relative: 1 %
HCT: 40.2 % (ref 36.0–46.0)
Hemoglobin: 12.9 g/dL (ref 12.0–15.0)
Immature Granulocytes: 1 %
Lymphocytes Relative: 24 %
Lymphs Abs: 2.2 10*3/uL (ref 0.7–4.0)
MCH: 27.9 pg (ref 26.0–34.0)
MCHC: 32.1 g/dL (ref 30.0–36.0)
MCV: 86.8 fL (ref 80.0–100.0)
Monocytes Absolute: 0.6 10*3/uL (ref 0.1–1.0)
Monocytes Relative: 7 %
Neutro Abs: 5.9 10*3/uL (ref 1.7–7.7)
Neutrophils Relative %: 66 %
Platelets: 378 10*3/uL (ref 150–400)
RBC: 4.63 MIL/uL (ref 3.87–5.11)
RDW: 13.7 % (ref 11.5–15.5)
WBC: 8.8 10*3/uL (ref 4.0–10.5)
nRBC: 0 % (ref 0.0–0.2)

## 2021-07-29 LAB — URINALYSIS, ROUTINE W REFLEX MICROSCOPIC
Bacteria, UA: NONE SEEN
Bilirubin Urine: NEGATIVE
Glucose, UA: NEGATIVE mg/dL
Ketones, ur: NEGATIVE mg/dL
Leukocytes,Ua: NEGATIVE
Nitrite: NEGATIVE
Protein, ur: NEGATIVE mg/dL
Specific Gravity, Urine: 1.01 (ref 1.005–1.030)
pH: 5 (ref 5.0–8.0)

## 2021-07-29 LAB — COMPREHENSIVE METABOLIC PANEL
ALT: 25 U/L (ref 0–44)
AST: 18 U/L (ref 15–41)
Albumin: 3.6 g/dL (ref 3.5–5.0)
Alkaline Phosphatase: 58 U/L (ref 38–126)
Anion gap: 10 (ref 5–15)
BUN: 9 mg/dL (ref 6–20)
CO2: 20 mmol/L — ABNORMAL LOW (ref 22–32)
Calcium: 9.1 mg/dL (ref 8.9–10.3)
Chloride: 105 mmol/L (ref 98–111)
Creatinine, Ser: 0.98 mg/dL (ref 0.44–1.00)
GFR, Estimated: >60 mL/min (ref >60)
Glucose, Bld: 127 mg/dL — ABNORMAL HIGH (ref 70–99)
Potassium: 3.7 mmol/L (ref 3.5–5.1)
Sodium: 135 mmol/L (ref 135–145)
Total Bilirubin: 0.6 mg/dL (ref 0.3–1.2)
Total Protein: 7 g/dL (ref 6.5–8.1)

## 2021-07-29 LAB — TROPONIN I (HIGH SENSITIVITY)
Troponin I (High Sensitivity): 2 ng/L (ref <18)
Troponin I (High Sensitivity): <2 ng/L (ref <18)

## 2021-07-29 LAB — I-STAT BETA HCG BLOOD, ED (MC, WL, AP ONLY): I-stat hCG, quantitative: <5 m[IU]/mL (ref <5)

## 2021-07-29 LAB — LIPASE, BLOOD: Lipase: 37 U/L (ref 11–51)

## 2021-07-29 MED ORDER — ACETAMINOPHEN 325 MG PO TABS
650.0000 mg | ORAL_TABLET | Freq: Once | ORAL | Status: AC
  Administered 2021-07-29: 650 mg via ORAL

## 2021-07-29 MED ORDER — ACETAMINOPHEN 325 MG PO TABS: 325.0000 mg | ORAL_TABLET | Freq: Once | ORAL | Status: DC

## 2021-07-29 MED ORDER — ONDANSETRON HCL 4 MG/2ML IJ SOLN
4.0000 mg | Freq: Once | INTRAMUSCULAR | Status: AC
  Administered 2021-07-29: 4 mg via INTRAVENOUS

## 2021-07-29 MED ORDER — ONDANSETRON 4 MG PO TBDP: 4.0000 mg | ORAL_TABLET | Freq: Once | ORAL | Status: DC

## 2021-07-29 NOTE — ED Provider Notes (Signed)
Emergency Medicine Provider Triage Evaluation Note  Brandy [name] , a 35 y.o. female  was evaluated in triage.  Pt complains of N/V. Has felt poorly for 4 days with body aches, chills, nausea and multiple episodes of emesis. Also having severe pain between her shoulder blades. No alleviating/aggravating factors Denies leg pain/swelling, hemoptysis, recent surgery/trauma, recent long travel, hormone use, personal hx of cancer, or hx of DVT/PE.Marland Kitchen Denies chance of pregnancy- states her husband is S/p vasectomy. She has had negative covid/flu tests.  Review of Systems  Positive: N/V, fatigue, weakness, body aches Negative: Fever,   Physical Exam  BP 102/72   Pulse (!) 106   Temp 98.1 F (36.7 C)   Resp 16   SpO2 98%  Gen:   Awake, no distress   Resp:  Normal effort  MSK:   Moves extremities without difficulty  Other:  No abdominal tenderness, mid upper thoracic TTP, symmetric pulses, no LE edema.   Medical Decision Making  Medically screening exam initiated at 3:08 AM.  Appropriate orders placed.  Leonilda Cozby was informed that the remainder of the evaluation will be completed by another provider, this initial triage assessment does not replace that evaluation, and the importance of remaining in the ED until their evaluation is complete.  Vomiting.    Amaryllis Dyke, PA-C 07/29/21 0346    Maudie Flakes, MD 07/29/21 [phone]

## 2021-07-29 NOTE — ED Notes (Signed)
Patient states she has to take care of kids and has to leave. IV taken out by Deshaunte, NT

## 2021-07-29 NOTE — ED Triage Notes (Signed)
Pt c/o NV, bodyaches, fever, and tenderness to upper back. Denies relief with motrin,has tested negative for flu and covid

## 2021-07-30 ENCOUNTER — Telehealth: Payer: Self-pay | Admitting: Neurology

## 2021-07-30 NOTE — Telephone Encounter (Signed)
Pt called and informed that This doesn't sound neurologic per Dr Tomi Likens.  The extensive neurologic workup that Dr Tomi Likens performed was normal.  Dr Tomi Likens dose not have a neurologic explanation for her symptoms.  She should probably follow up with her PCP. Pt stated she talked to her PCP but she will again

## 2021-07-30 NOTE — Telephone Encounter (Signed)
Pt called in stating she went to the ED yesterday. She feels like she is losing control of her muscles or something is attacking them. She has had to miss 3 days of work. She would like some advice on what to do before her next appointment on 12/09/21.

## 2021-07-30 NOTE — Telephone Encounter (Signed)
Pt stated she has aches and the pain will not go away. She went to her PCP on Monday and had lab work and was tested for FLU and COVID and was neg all labs came back normal, she stated that she feels like something is attacking her muscles pt started crying on the phone. When told she may need to be tested again for a false neg for the flu she said she knows that its not that its something else its something to do with her muscles and its attacking them

## 2021-07-31 ENCOUNTER — Telehealth: Payer: Self-pay | Admitting: Physician Assistant

## 2021-07-31 NOTE — Telephone Encounter (Signed)
Yes, 45 mins please.  Reminder for myself: discuss compliance with appts.

## 2021-07-31 NOTE — Telephone Encounter (Signed)
Pt scheduled 1/13  45 mins. On canc list. Pt reporting she has been having a lot of panic attacks and requesting Rx for Clonazepam 0.5 mg 1/d as needed for panic attack and Prozac 20 mg capsules 1/d if possible until apt.. Pharmacy [facility].

## 2021-07-31 NOTE — Telephone Encounter (Signed)
Pt last apt 07/01/20. Had a baby and now wanting to schedule apt to get back on meds. Is it ok to schedule? If so, how long.

## 2021-08-01 ENCOUNTER — Other Ambulatory Visit: Payer: Self-pay | Admitting: Psychiatry

## 2021-08-01 MED ORDER — CLONAZEPAM 0.5 MG PO TABS
0.5000 mg | ORAL_TABLET | Freq: Two times a day (BID) | ORAL | 0 refills | Status: DC | PRN
Start: 2021-08-01 — End: 2021-09-14

## 2021-08-01 NOTE — Telephone Encounter (Signed)
Pt's last refill of Clonazepam 0.5 mg #60 on 09/26/2020, her refills expired.   Dr. Clovis Pu can we send while Helene Kelp is out?

## 2021-08-01 NOTE — Telephone Encounter (Signed)
OK.  RX clonazepam 0.5 mg sent

## 2021-08-01 NOTE — Telephone Encounter (Signed)
Thank you :)

## 2021-08-03 NOTE — Telephone Encounter (Signed)
noted 

## 2021-08-04 ENCOUNTER — Telehealth: Payer: Self-pay | Admitting: Physician Assistant

## 2021-08-04 ENCOUNTER — Telehealth: Payer: Self-pay | Admitting: Neurology

## 2021-08-04 ENCOUNTER — Other Ambulatory Visit: Payer: Self-pay | Admitting: Physician Assistant

## 2021-08-04 MED ORDER — MIRTAZAPINE 7.5 MG PO TABS: 7.5000 mg | ORAL_TABLET | Freq: Every evening | ORAL | 0 refills | Status: DC | PRN

## 2021-08-04 NOTE — Telephone Encounter (Signed)
Called patient and she repeated info in phone note. She is not suicidal.   Confirmed pharmacy. [id number] [place], [place] - [address]  Looking in her medication history it looks like she was last on this in 2020 at a 7.5 mg dose.

## 2021-08-04 NOTE — Telephone Encounter (Signed)
Pt urgently needs Helene Kelp to call her in the Mirtazapine she has taken in the past.  She hasn't been sleeping and she is having medical issue with her arm and it's causing her to have severe panic attacks.  She has the Klonopin. In the past she had taken Prozac,  but that wasn't helping.  The Mirtazapine is the only thing that helped her in the past.   Next appt 1/13

## 2021-08-04 NOTE — Telephone Encounter (Signed)
Prescription was sent

## 2021-08-04 NOTE — Telephone Encounter (Signed)
LVM to RC 

## 2021-08-04 NOTE — Telephone Encounter (Signed)
Pt called in stating her PCP is referring her to a Rheumatologist to test her muscles, but she feels like her not being able to pick things up is neurological. She would like to see if Dr. Tomi Likens can refer her to a different neurologist for a second opinion?

## 2021-08-05 NOTE — Telephone Encounter (Signed)
Patient called again and said she is continuing to have problems with her body that may be neurological.  She said she wants to be evaluated by Dr. Tomi Likens or get a second opinion from another neurologist.

## 2021-08-05 NOTE — Telephone Encounter (Signed)
LMOVM for the patient to call the office back.  She should request this from her PCP

## 2021-08-06 NOTE — Telephone Encounter (Signed)
Pt stated someone called her yesterday she will talk to her PCP but she want to be added to Dr Georgie Chard wait list

## 2021-08-08 ENCOUNTER — Ambulatory Visit (HOSPITAL_BASED_OUTPATIENT_CLINIC_OR_DEPARTMENT_OTHER): Payer: 59 | Attending: Cardiology | Admitting: Cardiovascular Disease

## 2021-08-13 ENCOUNTER — Telehealth: Payer: Self-pay

## 2021-08-13 NOTE — Telephone Encounter (Signed)
Letter has been sent to patient instructing them to call us if they are still interested in completing their sleep study. If we have not received a response from the patient within 30 days of this notice, the order will be cancelled and they will need to discuss the need for a sleep study at their next office visit.  ° °

## 2021-08-13 NOTE — Progress Notes (Addendum)
NEUROLOGY FOLLOW UP OFFICE NOTE  Brandy [name] [id number]  Assessment/Plan:   Acute onset myalgias - similar to previous episode.  No illness.  No objective weakness.  Inflammatory markers reportedly negative.  Prior workup was negative, but given the acute onset of symptoms, will repeat testing.  Check CK and aldolase.  Will obtain labs checked by PCP.Marland Kitchen NCV-EMG left upper and lower extremities For pain, start gabapentin 100mg  three times daily.   Keep rheumatology appointment Further recommendations pending results.  09/12/2021 ADDENDUM:  Labs from 08/14/2021 included CK 29, aldolase 3.2, and B12 601.  NCV-EMG on 09/11/2021 normal.  She has since followed up with rheumatology.  TSH normal.  Sed rate mildly elevated at 24 and CRP elevated at 24.  Recommend to continue follow up/work-up with rheumatology. [name], DO  Subjective:  Brandy [name] is a 35 year old right-handed woman with depression, anxiety and history of kidney stones who follows up for myalgias.  She is accompanied by her sister.   UPDATE: Last seen in December 2021. At that time she endorsed memory deficits, dizziness, unsteady gait and dropping objects after contracting COVID in July 2021.  MRI of brain with and without contrast on 08/26/2020 personally reviewed was normal.  I had ordered a B12 level which was never performed.    Since that last visit, she has been seen by gastroenterology for nausea, diarrhea and epigastric pain.  Labs in March 2022, including CRP, TTG, IgA, GI profile PCR were negative.  Endoscopy/colonoscopy was reportedly unremarkable.    She also had a workup for fatigue.  Labs from April 2022 showed normal TSH and D 25-hydroxy with low normal range B12 of 290 and low folate 2.2.    She has also had a cardiac workup for palpitations and shortness of breath.  While cardiac monitoring did reveal symptomatic PVCs, cardiac workup overall was unremarkable and much of symptoms were believed to be  related to anxiety.    In November, she developed recurrence of myalgias with diffuse severe aching, particularly in the thighs, like before but now she feels weak in her arms.  Again, she notes twitching in her arms and legs.  She feels burning between her shoulder blades.  Labs for COVID and flu were negative.  Pain has persisted.  She also reports a metallic taste in her mouth.  She is vomiting due to the pain.  Her PCP reportedly checked some labs such as ANA and Lyme, which were reportedly normal.  He has referred her to rheumatology.     Current NSAIDS:  no Current analgesics:  no Current triptans:  no Current ergotamine:  no Current anti-emetic:  none Current muscle relaxants:  no Current anti-anxiolytic:  clonazepam PRN Current sleep aide:  no Current Antihypertensive medications:  none Current Antidepressant medications: no Current Anticonvulsant medications:  no Current anti-CGRP: none Current Vitamins/Herbal/Supplements:  no Current Antihistamines/Decongestants:  no Other therapy:  no Other medication:  no       HISTORY: I  Migraines: Onset:  April 2019, during first trimester of first pregnancy.  She had her child 02/02/18.  They have been worse since the birth.  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  Eye exam was normal.  Labs/hormones were unremarkable.  Repeat imaging since birth of her child has shown that it has decreased in size.  MRA of head and neck from 01/05/18 personally reviewed and was negative.  MRI of brain with and without contrast from 03/07/18 was personally  reviewed and demonstrated known pituitary adenoma but otherwise no acute process.  MRV of head performed the same day was negative for dural sinus thrombosis.  MRI of brain with and without contrast from 06/17/18 showed no acute findings associated with intracranial hypotension.  52mm pituitary gland stable and stated to be within normal limits for age and gender.  MRI of cervical spine  with and without contrast showed no abnormal fluid collection or enhancement.  Therefore, CSF leak was no longer suspected and she did not receive a blood patch. Location:  Left sided, left retro-orbital/temporal/occipital, but now bilateral Quality:  Pressure, "weird sensations throughout head" Intensity:  Severe.  She denies thunderclap headache. Aura:  no Prodrome:  no Postdrome:  no Associated symptoms:  Nausea, vomiting, bilateral eye lacrimation.  She denies associated, photophobia, phonophobia, unilateral numbness or weakness. Duration:  Often wakes up with it.  Off and on throughout the day. Frequency:  daily Frequency of abortive medication: none Triggers:  no Exacerbating factors:  Standing up, exertion Relieving factors:  Laying down Activity:  aggravates   II Myalgias/Muscle twitching: She developed myalgias after the first dose of Aimovig in August 2019.  She never continued the Aimovig after the first dose and symptoms persist.  ANA, CRP, TSH, B12, K+, CRP, CK and aldolase were unremarkable.  However, she started experiencing muscle twitches on and off for 2-3 months.  No cramps.  It involves the face, back, arms, legs and mostly in thighs.  She also reports increased weakness in the arms and legs.  Pain is worse, described as an aching but not cramping.  Any activity aggravates it.  She feels fatigue in general.  Denies difficulty speaking, swallowing, breathing or change in bowel or bladder control.  She is sleeping well since starting Remeron.  She reports anxiety due to her symptoms.  Repeat labs from May 2020 showed normal CMP, CK 42, and normal Mg of 2.  NCV-EMG of right upper and lower extremities on 03/30/2019 was normal without evidence of myopathy or other abnormality.   No family history of ALS.   III Dizziness/Memory problems/Balance problems: She had COVID at the end of July 2021 with double pneumonia.  She recovered and was doing well until October when she got up and  started to feel dizzy.  She has felt dizzy since.  It is a feeling of off-balance, not really spinning or lightheadedness.  She has prior history of dizziness and symptomatic PVCs.  She also started dropping objects.  Last month, she had a sinus infection and was prescribed Zpak and prednisone but had a side effect.  About 2 weeks ago, she developed fairly sudden onset short-term memory loss.  She quickly forgets conversations and word-finding difficulty.  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  Thyroid panel was normal.  Still reports muscle twitches.  Also reports pain along her spine between her shoulder blades.     Past NSAIDS:  Ibuprofen, naproxen, Cambia Past analgesics:  Fioricet (ineffective), Excedrin Migraine, Tylenol, Ultracet Past abortive triptans:  Sumatriptan tablet (made her feel awful, palpitations) Past abortive ergotamine:  no Past muscle relaxants:   baclofen, Flexeril Past anti-emetic:  promethazine, Zofran Past antihypertensive medications:  metoprolol Past antidepressant medications:  Cymbalta (increased pain), Effexor, fluoxetine, sertraline, Wellbutrin, citalopram, Lexapro, mirtazapine Past anticonvulsant medications:  lamogrigine Past anti-CGRP:  Aimovig 70mg  (reported various somatic complaints following first dose such as nausea, constipation, arthralgias, myalgias, pain, hair loss and fatigue. She never continued  the Aimovig after the first dose and symptoms persist.   Past vitamins/Herbal/Supplements:  no Past antihistamines/decongestants:  Benadryl Other past therapies:  none   Family history of headache:  no  PAST MEDICAL HISTORY: Past Medical History:  Diagnosis Date   Abdominal pain, epigastric 02/18/2017   Allergy    Anterior neck pain 12/30/2015   Anxiety    Anxiety associated with birthing process 06/14/2018   B12 deficiency 07/21/2016   Back pain    Chest pain    Chronic mixed headache syndrome 06/14/2018    COVID-19 virus infection 03/2020   with infusion    Depression    DUB (dysfunctional uterine bleeding) 06/04/2011   Dysphagia 07/20/2018   Enlarged thyroid gland 12/30/2015   Fatigue 12/30/2015   Gallstones    Generalized anxiety disorder 06/09/2016   GERD (gastroesophageal reflux disease) 02/19/2011   Headache, unspecified headache type 03/05/2018   History of nephrolithiasis    Kidney stone    Medication overuse headache 06/14/2018   Menorrhagia 11/09/2018   Migraine    Missed abortion 11/09/2018   Nausea vomiting and diarrhea 12/28/2013   Nausea without vomiting 02/18/2017   NEPHROLITHIASIS, HX OF 12/16/2007   Qualifier: Diagnosis of  By: Sherren Mocha MD, Jory Ee    Other fatigue    Palpitations 03/21/2018   Pituitary tumor    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   Precordial pain 03/21/2018   Prediabetes    PVC's (premature ventricular contractions)    Routine general medical examination at a health care facility 09/27/2014   Serum calcium elevated 12/28/2013   SOB (shortness of breath) on exertion    SVD (spontaneous vaginal delivery) 02/02/2018   Tendinitis of right ankle 08/14/2014   TMJ syndrome 05/30/2012   Vitamin D deficiency     MEDICATIONS: Current Outpatient Medications on File Prior to Visit  Medication Sig Dispense Refill   acetaminophen (TYLENOL) 500 MG tablet Take 1,000 mg by mouth every 6 (six) hours as needed for mild pain or headache.      Cholecalciferol (VITAMIN D) 2000 units CAPS Take 2,000 Units by mouth daily.     clonazePAM (KLONOPIN) 0.5 MG tablet Take 1 tablet (0.5 mg total) by mouth 2 (two) times daily as needed for anxiety. 60 tablet 0   esomeprazole (NEXIUM) 40 MG capsule TAKE 1 CAPSULE (40 MG TOTAL) BY MOUTH 2 (TWO) TIMES DAILY BEFORE A MEAL. 180 capsule 1   FLUoxetine (PROZAC) 20 MG capsule Take 1 capsule (20 mg total) by mouth daily. 30 capsule 1   magnesium oxide (MAG-OX) 400 MG tablet Take 1 tablet by mouth daily.     metoprolol tartrate (LOPRESSOR) 100 MG  tablet Take 2 hours prior to CT 1 tablet 0   mirtazapine (REMERON) 7.5 MG tablet Take 1 tablet (7.5 mg total) by mouth at bedtime as needed. 30 tablet 0   nitroGLYCERIN (NITROSTAT) 0.4 MG SL tablet Place 1 tablet (0.4 mg total) under the tongue every 5 (five) minutes as needed for chest pain. 90 tablet 3   Omega-3 Fatty Acids (FISH OIL) 1000 MG CAPS Take by mouth.     ondansetron (ZOFRAN ODT) 8 MG disintegrating tablet Take 1 tablet (8 mg total) by mouth every 8 (eight) hours as needed for nausea or vomiting. 20 tablet 0   Probiotic Product (PROBIOTIC PO) Take 1 capsule by mouth daily.     propranolol (INDERAL) 20 MG tablet Take 1 tablet (20 mg total) by mouth every 8 (eight) hours. 180 tablet 5   Turmeric  500 MG CAPS Take by mouth.     [DISCONTINUED] DULoxetine (CYMBALTA) 20 MG capsule Take 1 capsule (20 mg total) by mouth daily. (Patient not taking: Reported on 03/20/2019) 30 capsule 1   No current facility-administered medications on file prior to visit.    ALLERGIES: Allergies  Allergen Reactions   Metoclopramide Palpitations and Other (See Comments)    Rapid heart rate   Penicillins Hives, Rash and Other (See Comments)    Childhood reaction Has patient had a PCN reaction causing immediate rash, facial/tongue/throat swelling, SOB or lightheadedness with hypotension: No Has patient had a PCN reaction causing severe rash involving mucus membranes or skin necrosis: No Has patient had a PCN reaction that required hospitalization No Has patient had a PCN reaction occurring within the last 10 years: No If all of the above answers are "NO", then may proceed with Cephalosporin use.   Erenumab-Aooe Other (See Comments)    Other reaction(s): Myalgias (intolerance) Constipation, hair loss   Fluvoxamine Other (See Comments)    Other reaction(s): Fainted   Penicillin G Benzathine Other (See Comments)   Sumatriptan Palpitations   Zoloft [Sertraline Hcl] Anxiety    FAMILY HISTORY: Family  History  Problem Relation Age of Onset   Diabetes Mother    Mental illness Mother    Asthma Mother    Stomach cancer Mother    Colon polyps Mother    Heart disease Mother    Hypertension Mother    Arthritis Mother    Thyroid disease Mother    Cancer Mother    Anxiety disorder Mother    Depression Mother    Obesity Mother    Diabetes Father    Hypertension Father    Hyperlipidemia Father    Cancer Father    Sleep apnea Father    Anxiety disorder Sister    Colon cancer Maternal Grandmother    Diabetes Paternal Grandmother    Breast cancer Maternal Aunt    Lung cancer Maternal Aunt    Diabetes Paternal Aunt    Diabetes Maternal Uncle    Diabetes Maternal Uncle    Esophageal cancer Neg Hx    Rectal cancer Neg Hx       Objective:  Blood pressure 110/77, pulse 93, height 5\' 9"  (1.753 m), weight 247 lb 12.8 oz (112.4 kg), SpO2 98 %. General: No acute distress.  Patient appears well-groomed.   Head:  Normocephalic/atraumatic Eyes:  Fundi examined but not visualized Neck: supple, no paraspinal tenderness, full range of motion Heart:  Regular rate and rhythm Lungs:  Clear to auscultation bilaterally Back: No paraspinal tenderness Neurological Exam: alert and oriented to person, place, and time.  Speech fluent and not dysarthric, language intact.  CN II-XII intact. Bulk and tone normal, muscle strength 5/5 throughout.  Sensation to light touch intact.  Deep tendon reflexes 2+ throughout, toes downgoing.  Finger to nose testing intact.  Gait normal, Romberg negative.   [name], DO  CC: Lujean Amel, MD

## 2021-08-14 ENCOUNTER — Ambulatory Visit: Payer: 59 | Admitting: Neurology

## 2021-08-14 ENCOUNTER — Encounter: Payer: Self-pay | Admitting: Neurology

## 2021-08-14 ENCOUNTER — Other Ambulatory Visit: Payer: 59

## 2021-08-14 ENCOUNTER — Other Ambulatory Visit: Payer: Self-pay

## 2021-08-14 VITALS — BP 110/77 | HR 93 | Ht 69.0 in | Wt 247.8 lb

## 2021-08-14 DIAGNOSIS — M791 Myalgia, unspecified site: Secondary | ICD-10-CM

## 2021-08-14 DIAGNOSIS — R42 Dizziness and giddiness: Secondary | ICD-10-CM

## 2021-08-14 DIAGNOSIS — R2681 Unsteadiness on feet: Secondary | ICD-10-CM

## 2021-08-14 DIAGNOSIS — R413 Other amnesia: Secondary | ICD-10-CM

## 2021-08-14 LAB — VITAMIN B12: Vitamin B-12: 601 pg/mL (ref 211–911)

## 2021-08-14 LAB — CK: Total CK: 29 U/L (ref 7–177)

## 2021-08-14 NOTE — Patient Instructions (Signed)
Check CK, aldolase Nerve conduction study of left arm and leg Start gabapentin 100mg  three times daily Will review labs from PCP Further recommendations pending results.

## 2021-08-15 LAB — ALDOLASE: Aldolase: 3.2 U/L (ref <=8.1)

## 2021-08-18 ENCOUNTER — Telehealth: Payer: Self-pay | Admitting: Neurology

## 2021-08-18 NOTE — Telephone Encounter (Signed)
We can increase gabapentin to 300mg  twice daily,if she would like.

## 2021-08-18 NOTE — Telephone Encounter (Signed)
Advised pt, to try the  increase gabapentin to 300mg  twice daily,if she would like.  Pt states why should she increase if the 100 mg not working.  Advised pt, Normally Dr.Jaffe ask pt to stay on the current dosing for at least four weeks to see if it will work.  If she is willing try the new dosing it might help her.  Pt states she will try to stay on the 100 mg for now and give [place] a call back.

## 2021-08-18 NOTE — Telephone Encounter (Signed)
Telephone call to pt, Pt states the pain is bad. The Gabapentin not helping. She is aware her EMG appt is next week but she wants to know what to do until then.

## 2021-08-27 ENCOUNTER — Other Ambulatory Visit: Payer: Self-pay | Admitting: Physician Assistant

## 2021-09-11 ENCOUNTER — Ambulatory Visit: Payer: 59 | Admitting: Neurology

## 2021-09-11 ENCOUNTER — Other Ambulatory Visit: Payer: Self-pay

## 2021-09-11 DIAGNOSIS — M791 Myalgia, unspecified site: Secondary | ICD-10-CM | POA: Diagnosis not present

## 2021-09-11 NOTE — Procedures (Signed)
[facility]  [address]  [place], [place] [address] Tel: [phone] Fax:  [phone] Test Date:  09/11/2021  Patient: Brandy [name] DOB: 1986-02-18 Physician: Narda Amber, DO  Sex: Female Height: 5\' 9"  Ref Phys: [name], D.O.  ID#: [id number]   Technician:    Patient Complaints: This is a 36 year old female referred for evaluation of generalized muscle twitching and myalgias.  NCV & EMG Findings: All sensory responses are within normal limits including right median, ulnar, mixed palmar, sural, and superficial peroneal nerves. All motor responses are within normal limits including right median, ulnar, peroneal, and tibial nerves.   Right tibial H reflex study is within normal limits. There is no evidence of active or chronic motor axonal loss changes affecting any of the tested muscles.  Motor unit configuration and recruitment pattern is within normal limits.  Impression: This is a normal study of the right upper and lower extremities and stable compared to study on 03/30/2019.    In particular, there is no evidence of a motor neuron disorder, myopathy, cervical/lumbosacral radiculopathy, or sensorimotor polyneuropathy.     ___________________________ Narda Amber, DO    Nerve Conduction Studies Anti Sensory Summary Table   Stim Site NR Peak (ms) Norm Peak (ms) P-T Amp (V) Norm P-T Amp  Right Median Anti Sensory (2nd Digit)  33C  Wrist    2.4 <3.4 84.8 >20  Right Sup Peroneal Anti Sensory (Ant Lat Mall)  33C  12 cm    2.2 <4.5 21.5 >5  Right Sural Anti Sensory (Lat Mall)  33C  Calf    2.5 <4.5 24.9 >5  Right Ulnar Anti Sensory (5th Digit)  33C  Wrist    2.3 <3.1 64.9 >12   Motor Summary Table   Stim Site NR Onset (ms) Norm Onset (ms) O-P Amp (mV) Norm O-P Amp Site1 Site2 Delta-0 (ms) Dist (cm) Vel (m/s) Norm Vel (m/s)  Right Median Motor (Abd Poll Brev)  33C  Wrist    2.3 <3.9 7.2 >6 Elbow Wrist 4.3 29.0 67 >50  Elbow    6.6  7.0          Right Peroneal Motor (Ext Dig Brev)  33C  Ankle    3.1 <5.5 6.6 >3 B Fib Ankle 7.5 38.0 51 >40  B Fib    10.6  5.9  Poplt B Fib 1.4 7.0 50 >40  Poplt    12.0  5.9         Right Tibial Motor (Abd Hall Brev)  33C  Ankle    2.7 <6.0 8.5 >8 Knee Ankle 8.6 41.0 48 >40  Knee    11.3  6.8         Right Ulnar Motor (Abd Dig Minimi)  33C  Wrist    1.7 <3.1 10.0 >7 B Elbow Wrist 3.5 22.0 63 >50  B Elbow    5.2  9.0  A Elbow B Elbow 1.8 10.0 56 >50  A Elbow    7.0  8.8          Comparison Summary Table   Stim Site NR Peak (ms) Norm Peak (ms) P-T Amp (V) Site1 Site2 Delta-P (ms) Norm Delta (ms)  Right Median/Ulnar Palm Comparison (Wrist - 8cm)  33C  Median Palm    1.7 <2.2 92.0 Median Palm Ulnar Palm 0.3   Ulnar Palm    1.4 <2.2 26.5       H Reflex Studies   NR H-Lat (ms) Lat Norm (  ms) L-R H-Lat (ms)  Right Tibial (Gastroc)  33C     33.06 <35    EMG   Side Muscle Ins Act Fibs Psw Fasc Number Recrt Dur Dur. Amp Amp. Poly Poly. Comment  Right AntTibialis Nml Nml Nml Nml Nml Nml Nml Nml Nml Nml Nml Nml N/A  Right Gastroc Nml Nml Nml Nml Nml Nml Nml Nml Nml Nml Nml Nml N/A  Right Flex Dig Long Nml Nml Nml Nml Nml Nml Nml Nml Nml Nml Nml Nml N/A  Right RectFemoris Nml Nml Nml Nml Nml Nml Nml Nml Nml Nml Nml Nml N/A  Right GluteusMed Nml Nml Nml Nml Nml Nml Nml Nml Nml Nml Nml Nml N/A  Right 1stDorInt Nml Nml Nml Nml Nml Nml Nml Nml Nml Nml Nml Nml N/A  Right PronatorTeres Nml Nml Nml Nml Nml Nml Nml Nml Nml Nml Nml Nml N/A  Right Biceps Nml Nml Nml Nml Nml Nml Nml Nml Nml Nml Nml Nml N/A  Right Triceps Nml Nml Nml Nml Nml Nml Nml Nml Nml Nml Nml Nml N/A  Right Deltoid Nml Nml Nml Nml Nml Nml Nml Nml Nml Nml Nml Nml N/A      Waveforms:

## 2021-09-12 ENCOUNTER — Encounter: Payer: Self-pay | Admitting: Physician Assistant

## 2021-09-12 ENCOUNTER — Ambulatory Visit (INDEPENDENT_AMBULATORY_CARE_PROVIDER_SITE_OTHER): Payer: 59 | Admitting: Physician Assistant

## 2021-09-12 DIAGNOSIS — F4321 Adjustment disorder with depressed mood: Secondary | ICD-10-CM | POA: Diagnosis not present

## 2021-09-12 DIAGNOSIS — F410 Panic disorder [episodic paroxysmal anxiety] without agoraphobia: Secondary | ICD-10-CM

## 2021-09-12 DIAGNOSIS — F5105 Insomnia due to other mental disorder: Secondary | ICD-10-CM | POA: Diagnosis not present

## 2021-09-12 DIAGNOSIS — M7918 Myalgia, other site: Secondary | ICD-10-CM

## 2021-09-12 DIAGNOSIS — F99 Mental disorder, not otherwise specified: Secondary | ICD-10-CM

## 2021-09-12 DIAGNOSIS — F411 Generalized anxiety disorder: Secondary | ICD-10-CM | POA: Diagnosis not present

## 2021-09-12 MED ORDER — LORAZEPAM 1 MG PO TABS: 1.0000 mg | ORAL_TABLET | Freq: Three times a day (TID) | ORAL | 0 refills | Status: DC | PRN

## 2021-09-12 MED ORDER — FLUOXETINE HCL 20 MG PO CAPS: 20.0000 mg | ORAL_CAPSULE | Freq: Every day | ORAL | 1 refills | Status: DC

## 2021-09-12 NOTE — Progress Notes (Signed)
Patient advised of her EMG results.

## 2021-09-12 NOTE — Progress Notes (Signed)
[facility]  Patient ID: Brandy [name],  MRN: [id number]  PCP: Lujean Amel, MD  Date of Evaluation: 09/12/2021 Time spent:30 minutes  Chief Complaint:  Chief Complaint   Anxiety    Virtual Visit via Telehealth  I connected with patient by telephone, with their informed consent, and verified patient privacy and that I am speaking with the correct person using two identifiers.  I am private, in my office and the patient is at home.  I discussed the limitations, risks, security and privacy concerns of performing an evaluation and management service by telephone and the availability of in person appointments. I also discussed with the patient that there may be a patient responsible charge related to this service. The patient expressed understanding and agreed to proceed.   I discussed the assessment and treatment plan with the patient. The patient was provided an opportunity to ask questions and all were answered. The patient agreed with the plan and demonstrated an understanding of the instructions.   The patient was advised to call back or seek an in-person evaluation if the symptoms worsen or if the condition fails to improve as anticipated.  I provided 30 minutes of non-face-to-face time during this encounter.   HISTORY/CURRENT STATUS: Here for treatment of anxiety  Brandy [name] has not been seen since November 2021 when she was doing better with the anxiety.  She was able to discontinue the Prozac and then went off Klonopin.  Then a few days after Thanksgiving about 6 weeks ago, she started having what she calls muscle fatigue that came on suddenly and she is in a lot of pain.  She passed out at work a few weeks ago and was taken to the ER by ambulance.  Her D-dimer was elevated but CT of the chest showed no PE.  She states over and over, "they cannot find out what is wrong with me.  I am so anxious.  I am having panic attacks every day."  States when she feels panicky she  will get short of breath and have palpitations sometimes.  She has been put on propranolol for tachycardia and states that helps with the anxiety a little bit.  She gets sweaty and feels like she is dying.  A panic attack usually last about 20 minutes or so.  The Klonopin does not help anymore.  It has in the past.  States she had been doing well up until the end of November.  She was not having any depression and had been able to enjoy things.  Work was going well, things in her family life are going well.  She is not really feeling depressed now but she is nervous about what is going on with her that nobody can figure out.  She started back taking Prozac that she had left over.  Has been on that for approximately 1 week.  States that has pepped her up a little bit, she can tell that already.  Has not really helped the anxiety though.  No suicidal or homicidal thoughts.  She is having trouble sleeping, states it is from the muscle fatigue and pain that she has all over.  Then her mind goes all over the place and she cannot relax to go to sleep or either go back to sleep whichever.  Review of Systems  Constitutional:  Positive for malaise/fatigue.  HENT:  Positive for congestion.        Currently has COVID  Eyes: Negative.  Respiratory:  Positive for cough and shortness of breath.        Cough she believes is secondary to COVID.  Shortness of breath occurs when she has a panic attack and goes away when it is over.  Cardiovascular:  Positive for palpitations.       See HPI and records in chart.  Gastrointestinal: Negative.   Genitourinary: Negative.   Musculoskeletal:  Positive for back pain, joint pain, myalgias and neck pain.       See HPI.  Also records on her chart.  Skin: Negative.   Neurological: Negative.   Endo/Heme/Allergies: Negative.   Psychiatric/Behavioral:         See HPI.    Individual Medical History/ Review of Systems: Changes? :Yes   "muscle fatigue" came on suddenly after  Thanksgiving. Undergoing testing, 'they can't figure out what's wrong.' Was Rx Hydroxychloroquin yesterday by Rheumatologist, hasn't started it yet. Her CRP and ESR are high.  Currently has COVID.   Past medications for mental health diagnoses include: Prozac, Luvox made her 'pass out,' Elavil, Ativan, Xanax, Zoloft 'revved her up,' Seroquel, Lithium for 1 pill, Buspar, Inderal, Cymbalta, NAC, Turmeric  Allergies: Metoclopramide, Penicillins, Erenumab-aooe, Fluvoxamine, Penicillin g benzathine, Sumatriptan, and Zoloft [sertraline hcl]  Current Medications:  Current Outpatient Medications:    acetaminophen (TYLENOL) 500 MG tablet, Take 1,000 mg by mouth every 6 (six) hours as needed for mild pain or headache. , Disp: , Rfl:    Cholecalciferol (VITAMIN D) 2000 units CAPS, Take 2,000 Units by mouth daily., Disp: , Rfl:    esomeprazole (NEXIUM) 40 MG capsule, TAKE 1 CAPSULE (40 MG TOTAL) BY MOUTH 2 (TWO) TIMES DAILY BEFORE A MEAL., Disp: 180 capsule, Rfl: 1   FLUoxetine (PROZAC) 20 MG capsule, Take 1 capsule (20 mg total) by mouth daily., Disp: 30 capsule, Rfl: 1   Fluoxetine HCl, PMDD, 10 MG TABS, Take 20 mg by mouth., Disp: , Rfl:    LORazepam (ATIVAN) 1 MG tablet, Take 1 tablet (1 mg total) by mouth every 8 (eight) hours as needed for anxiety., Disp: 60 tablet, Rfl: 0   mirtazapine (REMERON) 7.5 MG tablet, TAKE 1 TABLET (7.5 MG TOTAL) BY MOUTH AT BEDTIME AS NEEDED., Disp: 90 tablet, Rfl: 0   Probiotic Product (PROBIOTIC PO), Take 1 capsule by mouth daily., Disp: , Rfl:    propranolol (INDERAL) 20 MG tablet, Take 1 tablet (20 mg total) by mouth every 8 (eight) hours. (Patient taking differently: Take 20 mg by mouth 2 (two) times daily.), Disp: 180 tablet, Rfl: 5   nitroGLYCERIN (NITROSTAT) 0.4 MG SL tablet, Place 1 tablet (0.4 mg total) under the tongue every 5 (five) minutes as needed for chest pain. (Patient not taking: Reported on 09/12/2021), Disp: 90 tablet, Rfl: 3   ondansetron (ZOFRAN ODT) 8  MG disintegrating tablet, Take 1 tablet (8 mg total) by mouth every 8 (eight) hours as needed for nausea or vomiting. (Patient not taking: Reported on 09/12/2021), Disp: 20 tablet, Rfl: 0 Medication Side Effects: none  Family Medical/ Social History: Changes?  No  MENTAL HEALTH EXAM:  There were no vitals taken for this visit.There is no height or weight on file to calculate BMI.  General Appearance:  Unable to assess  Eye Contact:   Unable to assess  Speech:  Clear and Coherent and Normal Rate  Volume:  Normal  Mood:  Anxious  Affect:   Unable to assess  Thought Process:  Goal Directed and Descriptions of Associations: Circumstantial  Orientation:  Full (Time, Place, and Person)  Thought Content: Obsessions and Rumination   Suicidal Thoughts:  No  Homicidal Thoughts:  No  Memory:  WNL  Judgement:  Good  Insight:  Good  Psychomotor Activity:   Unable to assess  Concentration:  Concentration: Good  Recall:  Good  Fund of Knowledge: Good  Language: Good  Assets:  Desire for Improvement  ADL's:  Intact  Cognition: WNL  Prognosis:  Good    DIAGNOSES:    ICD-10-CM   1. Panic disorder  F41.0     2. Situational depression  F43.21     3. Insomnia due to other mental disorder  F51.05    F99     4. Generalized anxiety disorder  F41.1     5. Myalgia, multiple sites  M79.18        Receiving Psychotherapy: No    RECOMMENDATIONS:  PDMP was reviewed.  Last Klonopin filled 08/01/2021. I provided 30  minutes of non-face-to-face time during this encounter, including time spent before and after the visit in records review, medical decision making, counseling pertinent to today's visit, and charting.  She has not been on the Prozac long enough for it to be efficacious so I recommend that she stay on that.  I also recommend stopping the Klonopin and using a different benzo.  I explained that Klonopin lasts longer but it takes a little longer to take effect.  She has taken Ativan in  the past which worked at that time.  I also made her aware that the Ativan strength is approximately half that of the Klonopin, that is the reason she will have a higher dose but can also take it more often during the day if needed.  She understands.  Discontinue Klonopin. Continue Prozac 20 mg, 1 p.o. daily. Start Ativan 1 mg, 1 p.o. every 8 hours as needed anxiety. Continue propranolol 20 mg 3 times daily from her PCP for tachycardia. Continue mirtazapine 7.5 mg, 1 p.o. nightly as needed per PCP. Recommend counseling. Return in 4 to 6 weeks.  Donnal Moat, PA-C

## 2021-09-14 ENCOUNTER — Encounter: Payer: Self-pay | Admitting: Physician Assistant

## 2021-09-20 ENCOUNTER — Other Ambulatory Visit: Payer: Self-pay | Admitting: Nurse Practitioner

## 2021-09-22 ENCOUNTER — Telehealth: Payer: Self-pay | Admitting: Psychiatry

## 2021-09-22 ENCOUNTER — Other Ambulatory Visit: Payer: Self-pay | Admitting: Psychiatry

## 2021-09-22 DIAGNOSIS — F410 Panic disorder [episodic paroxysmal anxiety] without agoraphobia: Secondary | ICD-10-CM

## 2021-09-22 DIAGNOSIS — R112 Nausea with vomiting, unspecified: Secondary | ICD-10-CM

## 2021-09-22 MED ORDER — FLUOXETINE HCL 10 MG PO CAPS: ORAL_CAPSULE | ORAL | 0 refills | Status: DC

## 2021-09-22 MED ORDER — ONDANSETRON 8 MG PO TBDP: 8.0000 mg | ORAL_TABLET | Freq: Three times a day (TID) | ORAL | 0 refills | Status: DC | PRN

## 2021-09-22 NOTE — Progress Notes (Signed)
RTC after hours [phone]  CO panic attacks. Having 2-3 per day and horrible Worried that it could be thyroid disease.  Her last TSH was 0.6 which is in the normal range. Taking lorazepam 1 twice daily.   Restarted fluoxetine  Remeron at night bc px sleeping CO NV from fluoxetine 20 mg daily and panic worse.  We discussed the diagnosis of panic disorder at length.  It is unlikely that any medical problem is causing this though there are rare exceptions such as hyperthyroidism or other thyroid disease and pheochromocytoma but it is extremely unlikely that that is the cause in her case.  She is having fairly classic panic attacks.  We discussed that.  We discussed how SSRIs can sometimes make panic worse before they make it better which is the reason for the prescription of lorazepam.  We discussed lorazepam is fairly short acting and she is not keeping it spread out enough to prevent panic.  She is tolerating the lorazepam fairly well but she is worried that 1 mg in the morning may make her too sleepy.  Plan: We will increase the Prozac more slowly because she notices that her panic is worse since starting it a couple of weeks ago.  Therefore reduce Prozac to 10 mg daily for 2 weeks and then increase it back to 20 mg daily. Spread out the lorazepam to try to prevent the panic until the Prozac can work and until she gets used to the Prozac which is causing some nausea as well as increased anxiety. Take lorazepam 0.5 mg in the morning and noon and 1 mg at 5 PM and at bedtime.  She can take an extra lorazepam 1 mg as needed panic.  Cautioned about sleepiness and sedation. If she can tolerate it taking lorazepam 1 mg 3 times daily consistently in a scheduled manner may do a better job of preventing the panic. She asked for Zofran from for the nausea.  It is not an anticipated that she will need that in a long-term manner. She agrees to this plan.  Lynder Parents MD, DFAPA

## 2021-09-24 ENCOUNTER — Encounter: Payer: 59 | Admitting: Family Medicine

## 2021-09-29 NOTE — Progress Notes (Signed)
HPI: [name] is a 36 y.o. female, who is here today for her routine physical.  Last CPE: Over a year ago. She was last seen on 11/15/18. She has been seeing Dr Brandy [name].  Regular exercise 3 or more time per week: She has not been consistent because severe myalgias and spasms. Following a healthy diet: "Pretty healthy", she cooks at home.  Chronic medical problems: GAD, vitamin D deficiency, PVCs, pituitary adenoma, chronic fatigue,,allergies,and GERD among some.  Since her last visit she has had some health issues. Started with severe generalized myalgias in 07/2021, unknown etiology. Elevated ESR and CRP, trial of Plaquenil helped some.  Episode of syncope 07/2021, had elevated D dimer and reporting negative chest CTA. Pituitary adenoma, she followed with neurologist. According to pt, she is not following regularly Dr Brandy [name] after otherwise normal neuro work-up.  Because elevated inflammatory markers she was referred to rheumatologist. It was a question of pituitary mass, she had a brain MRI on 08/26/2020, "no evidence of pituitary mass."  Psychiatrist for panic disorder and anxiety. Rheumatologist every 3 months.  Immunization History  Administered Date(s) Administered   DTP 05/02/1987, 07/04/1987, 09/06/1987, 11/20/1988, 03/07/1992   Hepatitis B 06/07/1998, 07/12/1998, 12/13/1998   Hepatitis B, ped/adol 06/07/1998, 07/12/1998, 12/13/1998   HiB (PRP-OMP) 05/13/1989   Influenza Split 06/01/2016, 06/22/2017, 07/04/2018   Influenza-Unspecified 07/05/2014, 05/31/2016, 06/22/2017, 07/04/2018   MMR 06/02/1988, 05/30/1998   OPV 05/02/1987, 07/04/1987, 11/20/1988, 03/07/1992   Td 07/11/2002, 09/27/2012   Tdap 11/25/2017   Health Maintenance  Topic Date Due   Hepatitis C Screening  Never done   PAP SMEAR-Modifier  09/30/2021 (Originally 03/24/2021)   COVID-19 Vaccine (1) 10/16/2021 (Originally 09/01/1987)   INFLUENZA VACCINE  11/28/2021 (Originally 03/31/2021)   TETANUS/TDAP   11/26/2027   HIV Screening  Completed   HPV VACCINES  Aged Out   She is established withy gyn, Dr Brandy [name] seen on 07/09/21.  Vit D def, she is on vit D 2000 U. She would like to have TSH checked. 07/2021 TSH was 1.6.  Lab Results  Component Value Date   TSH 1.540 12/26/2020   Review of Systems  Constitutional:  Positive for fatigue. Negative for appetite change and fever.  HENT:  Negative for hearing loss, mouth sores and sore throat.   Eyes:  Negative for redness and visual disturbance.  Respiratory:  Negative for cough, shortness of breath and wheezing.   Cardiovascular:  Negative for chest pain and leg swelling.  Gastrointestinal:  Negative for abdominal pain, nausea and vomiting.       No changes in bowel habits.  Endocrine: Negative for cold intolerance, heat intolerance, polydipsia, polyphagia and polyuria.  Genitourinary:  Positive for menstrual problem. Negative for decreased urine volume, dysuria, hematuria, vaginal bleeding and vaginal discharge.  Musculoskeletal:  Positive for myalgias. Negative for gait problem.  Skin:  Negative for color change and rash.  Allergic/Immunologic: Positive for environmental allergies.  Neurological:  Positive for headaches (Chronic/no more than usual). Negative for syncope and weakness.  Hematological:  Negative for adenopathy. Does not bruise/bleed easily.  Psychiatric/Behavioral:  Negative for confusion and hallucinations. The patient is nervous/anxious.   All other systems reviewed and are negative.  Current Outpatient Medications on File Prior to Visit  Medication Sig Dispense Refill   Cholecalciferol (VITAMIN D) 2000 units CAPS Take 2,000 Units by mouth daily.     esomeprazole (NEXIUM) 40 MG capsule TAKE 1 CAPSULE (40 MG TOTAL) BY MOUTH 2 (TWO) TIMES DAILY BEFORE A MEAL. 180 capsule 1  FLUoxetine (PROZAC) 10 MG capsule 1 daily for 2 weeks then 2 daily 30 capsule 0   FLUoxetine (PROZAC) 20 MG capsule Take 1 capsule (20 mg total)  by mouth daily. 30 capsule 1   LORazepam (ATIVAN) 1 MG tablet Take 1 tablet (1 mg total) by mouth every 8 (eight) hours as needed for anxiety. 60 tablet 0   mirtazapine (REMERON) 7.5 MG tablet TAKE 1 TABLET (7.5 MG TOTAL) BY MOUTH AT BEDTIME AS NEEDED. 90 tablet 0   ondansetron (ZOFRAN ODT) 8 MG disintegrating tablet Take 1 tablet (8 mg total) by mouth every 8 (eight) hours as needed for nausea or vomiting. 30 tablet 0   Probiotic Product (PROBIOTIC PO) Take 1 capsule by mouth daily.     propranolol (INDERAL) 20 MG tablet Take 1 tablet (20 mg total) by mouth every 8 (eight) hours. (Patient taking differently: Take 20 mg by mouth 2 (two) times daily.) 180 tablet 5   nitroGLYCERIN (NITROSTAT) 0.4 MG SL tablet Place 1 tablet (0.4 mg total) under the tongue every 5 (five) minutes as needed for chest pain. (Patient not taking: Reported on 09/12/2021) 90 tablet 3   [DISCONTINUED] DULoxetine (CYMBALTA) 20 MG capsule Take 1 capsule (20 mg total) by mouth daily. (Patient not taking: Reported on 03/20/2019) 30 capsule 1   No current facility-administered medications on file prior to visit.   Past Medical History:  Diagnosis Date   Abdominal pain, epigastric 02/18/2017   Allergy    Anterior neck pain 12/30/2015   Anxiety    Anxiety associated with birthing process 06/14/2018   B12 deficiency 07/21/2016   Back pain    Chest pain    Chronic mixed headache syndrome 06/14/2018   COVID-19 virus infection 03/2020   with infusion    Depression    DUB (dysfunctional uterine bleeding) 06/04/2011   Dysphagia 07/20/2018   Enlarged thyroid gland 12/30/2015   Fatigue 12/30/2015   Gallstones    Generalized anxiety disorder 06/09/2016   GERD (gastroesophageal reflux disease) 02/19/2011   Headache, unspecified headache type 03/05/2018   History of nephrolithiasis    Kidney stone    Medication overuse headache 06/14/2018   Menorrhagia 11/09/2018   Migraine    Missed abortion 11/09/2018   Nausea vomiting and diarrhea  12/28/2013   Nausea without vomiting 02/18/2017   NEPHROLITHIASIS, HX OF 12/16/2007   Qualifier: Diagnosis of  By: Sherren Mocha MD, Jory Ee    Other fatigue    Palpitations 03/21/2018   Pituitary tumor    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   Precordial pain 03/21/2018   Prediabetes    PVC's (premature ventricular contractions)    Routine general medical examination at a health care facility 09/27/2014   Serum calcium elevated 12/28/2013   SOB (shortness of breath) on exertion    SVD (spontaneous vaginal delivery) 02/02/2018   Tendinitis of right ankle 08/14/2014   TMJ syndrome 05/30/2012   Vitamin D deficiency    Past Surgical History:  Procedure Laterality Date   CHOLECYSTECTOMY  2014   KIDNEY STONE SURGERY     x 5   TYMPANOSTOMY TUBE PLACEMENT     WISDOM TOOTH EXTRACTION     Allergies  Allergen Reactions   Metoclopramide Palpitations and Other (See Comments)    Rapid heart rate   Penicillins Hives, Rash and Other (See Comments)    Childhood reaction Has patient had a PCN reaction causing immediate rash, facial/tongue/throat swelling, SOB or lightheadedness with hypotension: No Has patient had a PCN reaction causing severe  rash involving mucus membranes or skin necrosis: No Has patient had a PCN reaction that required hospitalization No Has patient had a PCN reaction occurring within the last 10 years: No If all of the above answers are "NO", then may proceed with Cephalosporin use.   Erenumab-Aooe Other (See Comments)    Other reaction(s): Myalgias (intolerance) Constipation, hair loss   Fluvoxamine Other (See Comments)    Other reaction(s): Fainted   Penicillin G Benzathine Other (See Comments)   Sumatriptan Palpitations   Zoloft [Sertraline Hcl] Anxiety    Family History  Problem Relation Age of Onset   Diabetes Mother    Mental illness Mother    Asthma Mother    Stomach cancer Mother    Colon polyps Mother    Heart disease Mother    Hypertension Mother    Arthritis  Mother    Thyroid disease Mother    Cancer Mother    Anxiety disorder Mother    Depression Mother    Obesity Mother    Diabetes Father    Hypertension Father    Hyperlipidemia Father    Cancer Father    Sleep apnea Father    Anxiety disorder Sister    Colon cancer Maternal Grandmother    Diabetes Paternal Grandmother    Breast cancer Maternal Aunt    Lung cancer Maternal Aunt    Diabetes Paternal Aunt    Diabetes Maternal Uncle    Diabetes Maternal Uncle    Esophageal cancer Neg Hx    Rectal cancer Neg Hx     Social History   Socioeconomic History   Marital status: Married    Spouse name: Jonathon   Number of children: 1   Years of education: Not on file   Highest education level: Bachelor's degree (e.g., BA, AB, BS)  Occupational History   Occupation: customer care representative  Tobacco Use   Smoking status: Never   Smokeless tobacco: Never  Vaping Use   Vaping Use: Never used  Substance and Sexual Activity   Alcohol use: Not Currently   Drug use: No   Sexual activity: Yes  Other Topics Concern   Not on file  Social History Narrative   Patient is right-handed. She lives with her husband and child in a split-level home.    Daughter born 01/2018   She avoids caffeine.    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  Wt Readings from Last 3 Encounters:  09/30/21 246 lb 8 oz (111.8 kg)  08/14/21 247 lb 12.8 oz (112.4 kg)  06/23/21 253 lb (114.8 kg)   Physical Exam Vitals and nursing note reviewed.  Constitutional:       General: She is not in acute distress.    Appearance: She is well-developed.  HENT:     Head: Normocephalic and atraumatic.     Right Ear: Hearing, tympanic membrane, ear canal and external ear normal.     Left Ear: Hearing, tympanic  membrane, ear canal and external ear normal.     Mouth/Throat:     Mouth: Mucous membranes are moist.     Pharynx: Oropharynx is clear. Uvula midline.  Eyes:     Extraocular Movements: Extraocular movements intact.     Conjunctiva/sclera: Conjunctivae normal.     Pupils: Pupils are equal, round, and reactive to light.  Neck:     Thyroid: No thyromegaly.     Trachea: No tracheal deviation.  Cardiovascular:     Rate and Rhythm: Normal rate and regular rhythm.     Pulses:          Dorsalis pedis pulses are 2+ on the right side and 2+ on the left side.     Heart sounds: No murmur heard. Pulmonary:     Effort: Pulmonary effort is normal. No respiratory distress.     Breath sounds: Normal breath sounds.  Abdominal:     Palpations: Abdomen is soft. There is no hepatomegaly or mass.     Tenderness: There is no abdominal tenderness.  Genitourinary:    Comments: Deferred to gyn. Musculoskeletal:     Right lower leg: No edema.     Left lower leg: No edema.     Comments: No major deformity or signs of synovitis appreciated.  Lymphadenopathy:     Cervical: No cervical adenopathy.     Upper Body:     Right upper body: No supraclavicular adenopathy.     Left upper body: No supraclavicular adenopathy.  Skin:    General: Skin is warm.     Findings: No erythema or rash.  Neurological:     General: No focal deficit present.     Mental Status: She is alert and oriented to person, place, and time.     Cranial Nerves: No cranial nerve deficit.     Coordination: Coordination normal.     Gait: Gait normal.     Deep Tendon Reflexes:     Reflex Scores:      Bicep reflexes are 2+ on the right side and 2+ on the left side.      Patellar reflexes are 2+ on the right  side and 2+ on the left side. Psychiatric:     Comments: Well groomed, good eye contact.   ASSESSMENT AND PLAN:  Ms. Alixandrea Milleson was here today annual physical examination.  Orders Placed This Encounter  Procedures   Hemoglobin A1c   Lipid panel   VITAMIN D 25 Hydroxy (Vit-D Deficiency, Fractures)   TSH   Lab Results  Component Value Date   TSH 1.59 09/30/2021   Lab Results  Component Value Date   CHOL 167 09/30/2021   HDL 45.50 09/30/2021   LDLCALC 89 09/30/2021   TRIG 165.0 (H) 09/30/2021   CHOLHDL 4 09/30/2021   Lab Results  Component Value Date   HGBA1C 6.1 09/30/2021   Routine general medical examination at a health care facility We discussed the importance of regular physical activity and healthy diet for prevention of chronic illness and/or complications. Preventive guidelines reviewed. Vaccination up-to-date. Continue her female preventive care with gynecologist. HCV screening could not be added to labs today, will place order to be added to next blood work. Next CPE in a year.  Class 2 severe obesity with serious comorbidity and body mass index (BMI) of 36.0 to 36.9 in adult, unspecified obesity type [facility]) She understands the benefits of wt loss as well as adverse effects of obesity. Consistency with healthy diet and physical activity encouraged.  Calorie count recommended, less than 1700 cal/day.  Vitamin D deficiency Continue vitamin D 2000 units daily. Further recommendation will be given according to 25 OH vitamin D result.  Myalgia, multiple sites Associated with elevated inflammatory markers, so far and according to pt, etiology is not clear. Continue following with rheumatologist.  Screening for lipoid disorders -     Lipid panel  Screening for endocrine, metabolic and immunity disorder -     Hemoglobin A1c  Return in 1 year (on 09/30/2022) for CPE.  Kabir Brannock G. Martinique, MD  [facility]. [place] office.

## 2021-09-30 ENCOUNTER — Encounter: Payer: Self-pay | Admitting: Family Medicine

## 2021-09-30 ENCOUNTER — Ambulatory Visit (INDEPENDENT_AMBULATORY_CARE_PROVIDER_SITE_OTHER): Payer: 59 | Admitting: Family Medicine

## 2021-09-30 VITALS — BP 128/70 | HR 99 | Temp 98.1°F | Resp 16 | Ht 69.0 in | Wt 246.5 lb

## 2021-09-30 DIAGNOSIS — Z0000 Encounter for general adult medical examination without abnormal findings: Secondary | ICD-10-CM

## 2021-09-30 DIAGNOSIS — Z130 Encounter for screening for diseases of the blood and blood-forming organs and certain disorders involving the immune mechanism: Secondary | ICD-10-CM | POA: Diagnosis not present

## 2021-09-30 DIAGNOSIS — M7918 Myalgia, other site: Secondary | ICD-10-CM

## 2021-09-30 DIAGNOSIS — Z13220 Encounter for screening for lipoid disorders: Secondary | ICD-10-CM

## 2021-09-30 DIAGNOSIS — Z13228 Encounter for screening for other metabolic disorders: Secondary | ICD-10-CM | POA: Diagnosis not present

## 2021-09-30 DIAGNOSIS — E559 Vitamin D deficiency, unspecified: Secondary | ICD-10-CM

## 2021-09-30 DIAGNOSIS — Z6836 Body mass index (BMI) 36.0-36.9, adult: Secondary | ICD-10-CM

## 2021-09-30 DIAGNOSIS — Z1329 Encounter for screening for other suspected endocrine disorder: Secondary | ICD-10-CM

## 2021-09-30 DIAGNOSIS — Z1159 Encounter for screening for other viral diseases: Secondary | ICD-10-CM

## 2021-09-30 DIAGNOSIS — D352 Benign neoplasm of pituitary gland: Secondary | ICD-10-CM | POA: Insufficient documentation

## 2021-09-30 LAB — LIPID PANEL
Cholesterol: 167 mg/dL (ref 0–200)
HDL: 45.5 mg/dL (ref >39.00)
LDL Cholesterol: 89 mg/dL (ref 0–99)
NonHDL: 121.54
Total CHOL/HDL Ratio: 4
Triglycerides: 165 mg/dL — ABNORMAL HIGH (ref 0.0–149.0)
VLDL: 33 mg/dL (ref 0.0–40.0)

## 2021-09-30 LAB — TSH: TSH: 1.59 u[IU]/mL (ref 0.35–5.50)

## 2021-09-30 LAB — HEMOGLOBIN A1C: Hgb A1c MFr Bld: 6.1 % (ref 4.6–6.5)

## 2021-09-30 LAB — VITAMIN D 25 HYDROXY (VIT D DEFICIENCY, FRACTURES): VITD: 33.9 ng/mL (ref 30.00–100.00)

## 2021-09-30 NOTE — Patient Instructions (Addendum)
A few things to remember from today's visit:   Routine general medical examination at a health care facility  Vitamin D deficiency  Myalgia, multiple sites  Screening for lipoid disorders  Screening for endocrine, metabolic and immunity disorder  Do not use My Chart to request refills or for acute issues that need immediate attention.   Please be sure medication list is accurate. If a new problem present, please set up appointment sooner than planned today.  Health Maintenance, Female Adopting a healthy lifestyle and getting preventive care are important in promoting health and wellness. Ask your health care provider about: The right schedule for you to have regular tests and exams. Things you can do on your own to prevent diseases and keep yourself healthy. What should I know about diet, weight, and exercise? Eat a healthy diet  Eat a diet that includes plenty of vegetables, fruits, low-fat dairy products, and lean protein. Do not eat a lot of foods that are high in solid fats, added sugars, or sodium. Maintain a healthy weight Body mass index (BMI) is used to identify weight problems. It estimates body fat based on height and weight. Your health care provider can help determine your BMI and help you achieve or maintain a healthy weight. Get regular exercise Get regular exercise. This is one of the most important things you can do for your health. Most adults should: Exercise for at least 150 minutes each week. The exercise should increase your heart rate and make you sweat (moderate-intensity exercise). Do strengthening exercises at least twice a week. This is in addition to the moderate-intensity exercise. Spend less time sitting. Even light physical activity can be beneficial. Watch cholesterol and blood lipids Have your blood tested for lipids and cholesterol at 35 years of age, then have this test every 5 years. Have your cholesterol levels checked more often if: Your lipid  or cholesterol levels are high. You are older than 35 years of age. You are at high risk for heart disease. What should I know about cancer screening? Depending on your health history and family history, you may need to have cancer screening at various ages. This may include screening for: Breast cancer. Cervical cancer. Colorectal cancer. Skin cancer. Lung cancer. What should I know about heart disease, diabetes, and high blood pressure? Blood pressure and heart disease High blood pressure causes heart disease and increases the risk of stroke. This is more likely to develop in people who have high blood pressure readings or are overweight. Have your blood pressure checked: Every 3-5 years if you are 3-27 years of age. Every year if you are 14 years old or older. Diabetes Have regular diabetes screenings. This checks your fasting blood sugar level. Have the screening done: Once every three years after age 19 if you are at a normal weight and have a low risk for diabetes. More often and at a younger age if you are overweight or have a high risk for diabetes. What should I know about preventing infection? Hepatitis B If you have a higher risk for hepatitis B, you should be screened for this virus. Talk with your health care provider to find out if you are at risk for hepatitis B infection. Hepatitis C Testing is recommended for: Everyone born from [birth year] through 1964. Anyone with known risk factors for hepatitis C. Sexually transmitted infections (STIs) Get screened for STIs, including gonorrhea and chlamydia, if: You are sexually active and are younger than 35 years of age. You are  older than 35 years of age and your health care provider tells you that you are at risk for this type of infection. Your sexual activity has changed since you were last screened, and you are at increased risk for chlamydia or gonorrhea. Ask your health care provider if you are at risk. Ask your health care  provider about whether you are at high risk for HIV. Your health care provider may recommend a prescription medicine to help prevent HIV infection. If you choose to take medicine to prevent HIV, you should first get tested for HIV. You should then be tested every 3 months for as long as you are taking the medicine. Pregnancy If you are about to stop having your period (premenopausal) and you may become pregnant, seek counseling before you get pregnant. Take 400 to 800 micrograms (mcg) of folic acid every day if you become pregnant. Ask for birth control (contraception) if you want to prevent pregnancy. Osteoporosis and menopause Osteoporosis is a disease in which the bones lose minerals and strength with aging. This can result in bone fractures. If you are 9 years old or older, or if you are at risk for osteoporosis and fractures, ask your health care provider if you should: Be screened for bone loss. Take a calcium or vitamin D supplement to lower your risk of fractures. Be given hormone replacement therapy (HRT) to treat symptoms of menopause. Follow these instructions at home: Alcohol use Do not drink alcohol if: Your health care provider tells you not to drink. You are pregnant, may be pregnant, or are planning to become pregnant. If you drink alcohol: Limit how much you have to: 0-1 drink a day. Know how much alcohol is in your drink. In the [place] one drink equals one 12 oz bottle of beer (355 mL), one 5 oz glass of wine (148 mL), or one 1 oz glass of hard liquor (44 mL). Lifestyle Do not use any products that contain nicotine or tobacco. These products include cigarettes, chewing tobacco, and vaping devices, such as e-cigarettes. If you need help quitting, ask your health care provider. Do not use street drugs. Do not share needles. Ask your health care provider for help if you need support or information about quitting drugs. General instructions Schedule regular health, dental, and  eye exams. Stay current with your vaccines. Tell your health care provider if: You often feel depressed. You have ever been abused or do not feel safe at home. Summary Adopting a healthy lifestyle and getting preventive care are important in promoting health and wellness. Follow your health care provider's instructions about healthy diet, exercising, and getting tested or screened for diseases. Follow your health care provider's instructions on monitoring your cholesterol and blood pressure. This information is not intended to replace advice given to you by your health care provider. Make sure you discuss any questions you have with your health care provider. Document Revised: 01/06/2021 Document Reviewed: 01/06/2021 Elsevier Patient Education  [employer].

## 2021-10-04 ENCOUNTER — Other Ambulatory Visit: Payer: Self-pay | Admitting: Physician Assistant

## 2021-10-17 ENCOUNTER — Ambulatory Visit: Payer: 59 | Admitting: Physician Assistant

## 2021-10-28 ENCOUNTER — Other Ambulatory Visit (HOSPITAL_BASED_OUTPATIENT_CLINIC_OR_DEPARTMENT_OTHER): Payer: Self-pay

## 2021-12-09 ENCOUNTER — Ambulatory Visit: Payer: 59 | Admitting: Neurology

## 2021-12-31 ENCOUNTER — Telehealth (INDEPENDENT_AMBULATORY_CARE_PROVIDER_SITE_OTHER): Payer: 59 | Admitting: Physician Assistant

## 2021-12-31 ENCOUNTER — Encounter: Payer: Self-pay | Admitting: Physician Assistant

## 2021-12-31 DIAGNOSIS — F410 Panic disorder [episodic paroxysmal anxiety] without agoraphobia: Secondary | ICD-10-CM | POA: Diagnosis not present

## 2021-12-31 DIAGNOSIS — F5105 Insomnia due to other mental disorder: Secondary | ICD-10-CM | POA: Diagnosis not present

## 2021-12-31 DIAGNOSIS — F4320 Adjustment disorder, unspecified: Secondary | ICD-10-CM

## 2021-12-31 DIAGNOSIS — F99 Mental disorder, not otherwise specified: Secondary | ICD-10-CM

## 2021-12-31 DIAGNOSIS — F411 Generalized anxiety disorder: Secondary | ICD-10-CM

## 2021-12-31 DIAGNOSIS — F331 Major depressive disorder, recurrent, moderate: Secondary | ICD-10-CM

## 2021-12-31 MED ORDER — CLONAZEPAM 0.5 MG PO TABS: 0.5000 mg | ORAL_TABLET | Freq: Two times a day (BID) | ORAL | 1 refills | Status: DC | PRN

## 2021-12-31 MED ORDER — MIRTAZAPINE 7.5 MG PO TABS: 7.5000 mg | ORAL_TABLET | Freq: Every evening | ORAL | 1 refills | Status: DC | PRN

## 2021-12-31 MED ORDER — FLUOXETINE HCL 40 MG PO CAPS: 40.0000 mg | ORAL_CAPSULE | Freq: Every day | ORAL | 1 refills | Status: DC

## 2021-12-31 NOTE — Progress Notes (Signed)
[facility] ? ?Patient ID: [name],  ?MRN: [id number] ? ?PCP: Lujean Amel, MD ? ?Date of Evaluation: 12/31/2021 ?Time spent:30 minutes ? ?Chief Complaint:  ?Chief Complaint   ?Anxiety; Depression; Insomnia; Follow-up ?  ? ? ?Virtual Visit via Telehealth ? ?I connected with patient by a video enabled telemedicine application with their informed consent, and verified patient privacy and that I am speaking with the correct person using two identifiers.  I am private, in my office and the patient is at work. ? ?I discussed the limitations, risks, security and privacy concerns of performing an evaluation and management service by video and the availability of in person appointments. I also discussed with the patient that there may be a patient responsible charge related to this service. The patient expressed understanding and agreed to proceed. ?  ?I discussed the assessment and treatment plan with the patient. The patient was provided an opportunity to ask questions and all were answered. The patient agreed with the plan and demonstrated an understanding of the instructions. ?  ?The patient was advised to call back or seek an in-person evaluation if the symptoms worsen or if the condition fails to improve as anticipated. ? ?I provided 30 minutes of non-face-to-face time during this encounter. ? ?HISTORY/CURRENT STATUS: ?Not doing well, a lot of sadness and anxiety right now. ? ?Her 35 year old cousin who is like a niece to her is dying of glioblastoma.  Last week she was given 24 hours to live, then 3 days, and now she was sent home on hospice.  That all started a week ago.  States her cousin is a true inspiration, she is ready to go to heaven and see Jesus and she wants to point as many people that way as she can.  It's just very hard.  She has never had to face anything like this. ? ?And 2 days ago her mom had to be taken to the emergency room by ambulance because her oxygen saturation was in the  80s.  She has congestive heart failure and has been sick for quite some time that is tough to deal with also, seeing her mom be that sick. ? ?A neighbor that she was pretty close to died last week.  Her neighbor had 2 heart stents and a angioplasty of 1 vessel the week prior and then went home, had a another heart attack and Alaney was with her when she coded at home and EMS was given her CPR but she did not make it. ? ?With all of these things going on she has been very sad understandably.  Has not been sleeping very well.  She is out of the mirtazapine which has been helpful.  She only takes it as needed.  Has not felt like eating very much either and when she has had this problem in the past the mirtazapine did help increase her appetite.  She is working but it is difficult.  She is very anxious and having at least 1 panic attack every day.  At the last visit in January we changed Klonopin to Ativan but it did not work as well as the [employer].  She would like to switch back if it is okay.  She has taken Xanax in the past but it did not help very much at all.  Even when she is not having a panic attack she just feels overwhelmed a lot.  Personal hygiene and ADLs are normal.  No suicidal or homicidal thoughts. ? ?Patient denies  increased energy with decreased need for sleep, no increased talkativeness, no racing thoughts, no impulsivity or risky behaviors, no increased spending, no increased libido, no grandiosity, no increased irritability or anger, and no hallucinations. ? ?Denies dizziness, syncope, seizures, numbness, tingling, tremor, tics, unsteady gait, slurred speech, confusion. Denies muscle or joint pain, stiffness, or dystonia. ? ? ?Individual Medical History/ Review of Systems: Changes? :No    ?  ?Past medications for mental health diagnoses include: ?Prozac, Luvox made her 'pass out,' Elavil, Ativan, Xanax, Zoloft 'revved her up,' Seroquel, Lithium for 1 pill, Buspar, Inderal, Cymbalta, NAC,  Turmeric ? ?Allergies: Metoclopramide, Penicillins, Erenumab-aooe, Fluvoxamine, Penicillin g benzathine, Sumatriptan, and Zoloft [sertraline hcl] ? ?Current Medications:  ?Current Outpatient Medications:  ?  Cholecalciferol (VITAMIN D) 2000 units CAPS, Take 2,000 Units by mouth daily., Disp: , Rfl:  ?  clonazePAM (KLONOPIN) 0.5 MG tablet, Take 1 tablet (0.5 mg total) by mouth 2 (two) times daily as needed for anxiety., Disp: 60 tablet, Rfl: 1 ?  esomeprazole (NEXIUM) 40 MG capsule, TAKE 1 CAPSULE (40 MG TOTAL) BY MOUTH 2 (TWO) TIMES DAILY BEFORE A MEAL., Disp: 180 capsule, Rfl: 1 ?  FLUoxetine (PROZAC) 40 MG capsule, Take 1 capsule (40 mg total) by mouth daily., Disp: 30 capsule, Rfl: 1 ?  ondansetron (ZOFRAN ODT) 8 MG disintegrating tablet, Take 1 tablet (8 mg total) by mouth every 8 (eight) hours as needed for nausea or vomiting., Disp: 30 tablet, Rfl: 0 ?  Probiotic Product (PROBIOTIC PO), Take 1 capsule by mouth daily., Disp: , Rfl:  ?  mirtazapine (REMERON) 7.5 MG tablet, Take 1 tablet (7.5 mg total) by mouth at bedtime as needed., Disp: 90 tablet, Rfl: 1 ?  nitroGLYCERIN (NITROSTAT) 0.4 MG SL tablet, Place 1 tablet (0.4 mg total) under the tongue every 5 (five) minutes as needed for chest pain. (Patient not taking: Reported on 09/12/2021), Disp: 90 tablet, Rfl: 3 ?  propranolol (INDERAL) 20 MG tablet, Take 1 tablet (20 mg total) by mouth every 8 (eight) hours. (Patient not taking: Reported on 12/31/2021), Disp: 180 tablet, Rfl: 5 ?Medication Side Effects: none ? ?Family Medical/ Social History: Changes?  Neighbor died last week, her cousin who is 35 yrs old is dying of glioblastoma, Mom is sick too, had to go to ER by ambulance (has chronic heart disease) will be seeing another heart specialist soon.  "They do not know how else to help her." ? ?MENTAL HEALTH EXAM: ? ?There were no vitals taken for this visit.There is no height or weight on file to calculate BMI.  ?General Appearance: Casual and Well Groomed  ?Eye  Contact:  Good  ?Speech:  Clear and Coherent and Normal Rate  ?Volume:  Normal  ?Mood:  Anxious and Depressed  ?Affect:  Depressed, Tearful, and Anxious  ?Thought Process:  Goal Directed and Descriptions of Associations: Circumstantial  ?Orientation:  Full (Time, Place, and Person)  ?Thought Content: Logical   ?Suicidal Thoughts:  No  ?Homicidal Thoughts:  No  ?Memory:  WNL  ?Judgement:  Good  ?Insight:  Good  ?Psychomotor Activity:  Normal  ?Concentration:  Concentration: Good  ?Recall:  Good  ?Fund of Knowledge: Good  ?Language: Good  ?Assets:  Desire for Improvement  ?ADL's:  Intact  ?Cognition: WNL  ?Prognosis:  Good  ? ?09/30/2021 ?Labs ?Hemoglobin A1c 6.1 ?Lipid panel total cholesterol 167, triglycerides 165, HDL 45, LDL 89 ?Vitamin D 33.9 ?TSH 1.5 ? ? ?DIAGNOSES:  ?  ICD-10-CM   ?1. Major depressive disorder, recurrent episode,  moderate (HCC)  F33.1   ?  ?2. Generalized anxiety disorder  F41.1   ?  ?3. Panic disorder  F41.0   ?  ?4. Insomnia due to other mental disorder  F51.05   ? F99   ?  ?5. Anticipatory grief  F43.20   ?  ? ? ?Receiving Psychotherapy: No   Rinaldo Cloud, LCSW in the past. ? ? ?RECOMMENDATIONS:  ?PDMP was reviewed.  Last Ativan filled 09/12/2021. ?I provided 30 minutes of non-face-to-face time during this encounter, including time spent before and after the visit in records review, medical decision making, counseling pertinent to today's visit, and charting.  ?My condolences for all the sadness she is going through right now. ?Recommend increasing the Prozac as it can help with the underlying anxiety and depression. ?Recommend restarting Klonopin and also mirtazapine. ?She would like to get back into counseling. ?I did not review the labs until after the patient's appointment was over.  I will discuss the results with her at her next visit, and likely recommend vitamin D 50,000 IUs weekly because she is at low end of normal.  This is not crucial, and knowing her like I do feel it is best not  to add 1 more thing at this time.  In psychiatry we would like to have the 25 hydroxy vitamin D level at least 50 and preferably 60 or 70.  ? ?Restart Klonopin 0.5 mg, 1 p.o. twice daily as needed. ?Increase Pr

## 2022-01-01 ENCOUNTER — Telehealth: Payer: 59 | Admitting: Physician Assistant

## 2022-01-10 ENCOUNTER — Emergency Department (HOSPITAL_COMMUNITY)
Admission: EM | Admit: 2022-01-10 | Discharge: 2022-01-11 | Disposition: A | Discharge status: Home / Self Care | Payer: 59 | Attending: Emergency Medicine | Admitting: Emergency Medicine

## 2022-01-10 ENCOUNTER — Emergency Department (HOSPITAL_COMMUNITY): Payer: 59

## 2022-01-10 ENCOUNTER — Other Ambulatory Visit: Payer: Self-pay

## 2022-01-10 ENCOUNTER — Encounter (HOSPITAL_COMMUNITY): Payer: Self-pay | Admitting: *Deleted

## 2022-01-10 DIAGNOSIS — R059 Cough, unspecified: Secondary | ICD-10-CM | POA: Diagnosis not present

## 2022-01-10 DIAGNOSIS — R072 Precordial pain: Secondary | ICD-10-CM | POA: Diagnosis present

## 2022-01-10 DIAGNOSIS — R197 Diarrhea, unspecified: Secondary | ICD-10-CM | POA: Diagnosis not present

## 2022-01-10 DIAGNOSIS — Z20822 Contact with and (suspected) exposure to covid-19: Secondary | ICD-10-CM | POA: Diagnosis not present

## 2022-01-10 DIAGNOSIS — R112 Nausea with vomiting, unspecified: Secondary | ICD-10-CM | POA: Diagnosis not present

## 2022-01-10 LAB — CBC
HCT: 39.8 % (ref 36.0–46.0)
Hemoglobin: 12.8 g/dL (ref 12.0–15.0)
MCH: 27.7 pg (ref 26.0–34.0)
MCHC: 32.2 g/dL (ref 30.0–36.0)
MCV: 86.1 fL (ref 80.0–100.0)
Platelets: 441 10*3/uL — ABNORMAL HIGH (ref 150–400)
RBC: 4.62 MIL/uL (ref 3.87–5.11)
RDW: 12.6 % (ref 11.5–15.5)
WBC: 11 10*3/uL — ABNORMAL HIGH (ref 4.0–10.5)
nRBC: 0 % (ref 0.0–0.2)

## 2022-01-10 LAB — BASIC METABOLIC PANEL
Anion gap: 9 (ref 5–15)
BUN: 7 mg/dL (ref 6–20)
CO2: 23 mmol/L (ref 22–32)
Calcium: 8.9 mg/dL (ref 8.9–10.3)
Chloride: 105 mmol/L (ref 98–111)
Creatinine, Ser: 1.03 mg/dL — ABNORMAL HIGH (ref 0.44–1.00)
GFR, Estimated: >60 mL/min (ref >60)
Glucose, Bld: 111 mg/dL — ABNORMAL HIGH (ref 70–99)
Potassium: 4 mmol/L (ref 3.5–5.1)
Sodium: 137 mmol/L (ref 135–145)

## 2022-01-10 LAB — I-STAT BETA HCG BLOOD, ED (MC, WL, AP ONLY): I-stat hCG, quantitative: <5 m[IU]/mL (ref <5)

## 2022-01-10 LAB — TROPONIN I (HIGH SENSITIVITY): Troponin I (High Sensitivity): 2 ng/L (ref <18)

## 2022-01-10 MED ORDER — ONDANSETRON 4 MG PO TBDP
4.0000 mg | ORAL_TABLET | Freq: Once | ORAL | Status: AC
  Administered 2022-01-11: 4 mg via ORAL
  Filled 2022-01-10: qty 1

## 2022-01-10 NOTE — ED Triage Notes (Signed)
The pt arrived by  [facility]  the pt is c/o chest paIN FOR 2 -3 DAYS.  SHE HAS ALSO HAD A COUGH AND A LOW GRADE FEVER  Past dx of pneumonia  Saturday  lmp 3 days ago ?

## 2022-01-11 ENCOUNTER — Encounter (HOSPITAL_COMMUNITY): Payer: Self-pay | Admitting: Emergency Medicine

## 2022-01-11 LAB — HEPATIC FUNCTION PANEL
ALT: 23 U/L (ref 0–44)
AST: 19 U/L (ref 15–41)
Albumin: 3.9 g/dL (ref 3.5–5.0)
Alkaline Phosphatase: 55 U/L (ref 38–126)
Bilirubin, Direct: 0.2 mg/dL (ref 0.0–0.2)
Indirect Bilirubin: 0.5 mg/dL (ref 0.3–0.9)
Total Bilirubin: 0.7 mg/dL (ref 0.3–1.2)
Total Protein: 7.4 g/dL (ref 6.5–8.1)

## 2022-01-11 LAB — RESP PANEL BY RT-PCR (FLU A&B, COVID) ARPGX2
Influenza A by PCR: NEGATIVE
Influenza B by PCR: NEGATIVE
SARS Coronavirus 2 by RT PCR: NEGATIVE

## 2022-01-11 LAB — LIPASE, BLOOD: Lipase: 39 U/L (ref 11–51)

## 2022-01-11 LAB — TROPONIN I (HIGH SENSITIVITY): Troponin I (High Sensitivity): <2 ng/L (ref <18)

## 2022-01-11 MED ORDER — KETOROLAC TROMETHAMINE 30 MG/ML IJ SOLN
30.0000 mg | Freq: Once | INTRAMUSCULAR | Status: AC
  Administered 2022-01-11: 30 mg via INTRAVENOUS
  Filled 2022-01-11: qty 1

## 2022-01-11 MED ORDER — ONDANSETRON HCL 8 MG PO TABS: 8.0000 mg | ORAL_TABLET | Freq: Three times a day (TID) | ORAL | 0 refills | Status: DC | PRN

## 2022-01-11 MED ORDER — SODIUM CHLORIDE 0.9 % IV BOLUS
500.0000 mL | Freq: Once | INTRAVENOUS | Status: AC
Start: 2022-01-11 — End: 2022-01-11
  Administered 2022-01-11: 500 mL via INTRAVENOUS

## 2022-01-11 MED ORDER — ONDANSETRON HCL 4 MG/2ML IJ SOLN
4.0000 mg | Freq: Once | INTRAMUSCULAR | Status: AC
  Administered 2022-01-11: 4 mg via INTRAVENOUS
  Filled 2022-01-11: qty 2

## 2022-01-11 MED ORDER — ALUM & MAG HYDROXIDE-SIMETH 200-200-20 MG/5ML PO SUSP: 30.0000 mL | Freq: Once | ORAL | Status: DC

## 2022-01-11 NOTE — ED Provider Notes (Signed)
?[facility] ?Provider Note ? ? ?CSN: [id number] ?Arrival date & time: 01/10/22  2010 ? ?  ? ?History ? ?Chief Complaint  ?Patient presents with  ? Chest Pain  ? ? ?Brandy [name] is a 36 y.o. female. ? ?The history is provided by the patient.  ?Chest Pain ?Pain location:  Substernal area ?Pain quality: dull   ?Pain radiates to:  Does not radiate ?Pain severity:  Moderate ?Onset quality:  Gradual ?Duration:  3 days ?Timing:  Constant ?Progression:  Waxing and waning ?Chronicity:  New ?Context: not breathing   ?Context comment:  Congestion cough and nausea vomiting and diarrhea.  Has sick family members ?Relieved by:  Nothing ?Worsened by:  Nothing ?Ineffective treatments:  None tried ?Associated symptoms: cough, nausea and vomiting   ?Associated symptoms: no fever, no orthopnea, no palpitations and no shortness of breath   ?Risk factors: no aortic disease, not female, not pregnant, no prior DVT/PE and no smoking   ? ?  ? ?Home Medications ?Prior to Admission medications   ?Medication Sig Start Date End Date Taking? Authorizing Provider  ?Cholecalciferol (VITAMIN D) 2000 units CAPS Take 2,000 Units by mouth daily.    [provider]  ?clonazePAM (KLONOPIN) 0.5 MG tablet Take 1 tablet (0.5 mg total) by mouth 2 (two) times daily as needed for anxiety. 12/31/21   Donnal Moat T, PA-C  ?esomeprazole (NEXIUM) 40 MG capsule TAKE 1 CAPSULE (40 MG TOTAL) BY MOUTH 2 (TWO) TIMES DAILY BEFORE A MEAL. 09/21/21   Noralyn Pick, NP  ?FLUoxetine (PROZAC) 40 MG capsule Take 1 capsule (40 mg total) by mouth daily. 12/31/21   Donnal Moat T, PA-C  ?mirtazapine (REMERON) 7.5 MG tablet Take 1 tablet (7.5 mg total) by mouth at bedtime as needed. 12/31/21   Donnal Moat T, PA-C  ?nitroGLYCERIN (NITROSTAT) 0.4 MG SL tablet Place 1 tablet (0.4 mg total) under the tongue every 5 (five) minutes as needed for chest pain. ?Patient not taking: Reported on 09/12/2021 06/23/21 09/21/21  Berniece Salines, DO  ?ondansetron (ZOFRAN ODT) 8 MG disintegrating tablet Take 1 tablet (8 mg total) by mouth every 8 (eight) hours as needed for nausea or vomiting. 09/22/21   Cottle, [employer] MD  ?Probiotic Product (PROBIOTIC PO) Take 1 capsule by mouth daily.    [provider]  ?propranolol (INDERAL) 20 MG tablet Take 1 tablet (20 mg total) by mouth every 8 (eight) hours. ?Patient not taking: Reported on 12/31/2021 11/01/20   Berniece Salines, DO  ?DULoxetine (CYMBALTA) 20 MG capsule Take 1 capsule (20 mg total) by mouth daily. ?Patient not taking: Reported on 03/20/2019 01/11/19 06/21/19  Addison Lank, PA-C  ?   ? ?Allergies    ?Metoclopramide, Penicillins, Erenumab-aooe, Fluvoxamine, Penicillin g benzathine, Sumatriptan, and Zoloft [sertraline hcl]   ? ?Review of Systems   ?Review of Systems  ?Constitutional:  Negative for fever.  ?HENT:  Positive for congestion and voice change.   ?Respiratory:  Positive for cough. Negative for shortness of breath.   ?Cardiovascular:  Positive for chest pain. Negative for palpitations and orthopnea.  ?Gastrointestinal:  Positive for diarrhea, nausea and vomiting.  ?Genitourinary:  Negative for dysuria.  ?Neurological:  Negative for facial asymmetry.  ?All other systems reviewed and are negative. ? ?Physical Exam ?Updated Vital Signs ?BP 125/77 (BP Location: Left Arm)   Pulse 89   Temp 98.2 ?F (36.8 ?C) (Oral)   Resp 18   Ht '5\' 9"'$  (1.753 m)   Wt  111.3 kg   LMP 01/07/2022   SpO2 96%   BMI 36.24 kg/m?  ?Physical Exam ?Vitals and nursing note reviewed.  ?Constitutional:   ?   General: She is not in acute distress. ?   Appearance: Normal appearance.  ?HENT:  ?   Head: Normocephalic and atraumatic.  ?   Nose: Nose normal.  ?Eyes:  ?   Conjunctiva/sclera: Conjunctivae normal.  ?   Pupils: Pupils are equal, round, and reactive to light.  ?Cardiovascular:  ?   Rate and Rhythm: Normal rate and regular rhythm.  ?   Pulses: Normal pulses.  ?   Heart sounds: Normal heart sounds.   ?Pulmonary:  ?   Effort: Pulmonary effort is normal.  ?   Breath sounds: Normal breath sounds.  ?Abdominal:  ?   General: Bowel sounds are normal.  ?   Palpations: Abdomen is soft.  ?   Tenderness: There is no abdominal tenderness. There is no guarding or rebound.  ?Musculoskeletal:     ?   General: No tenderness. Normal range of motion.  ?   Cervical back: Normal range of motion and neck supple.  ?   Right lower leg: No edema.  ?   Left lower leg: No edema.  ?Skin: ?   General: Skin is warm and dry.  ?   Capillary Refill: Capillary refill takes less than 2 seconds.  ?Neurological:  ?   General: No focal deficit present.  ?   Mental Status: She is alert and oriented to person, place, and time.  ?   Deep Tendon Reflexes: Reflexes normal.  ?Psychiatric:     ?   Mood and Affect: Mood is anxious.  ? ? ?ED Results / Procedures / Treatments   ?Labs ?(all labs ordered are listed, but only abnormal results are displayed) ?Results for orders placed or performed during the hospital encounter of 01/10/22  ?Resp Panel by RT-PCR (Flu A&B, Covid) Nasopharyngeal Swab  ? Specimen: Nasopharyngeal Swab; Nasopharyngeal(NP) swabs in vial transport medium  ?Result Value Ref Range  ? SARS Coronavirus 2 by RT PCR NEGATIVE NEGATIVE  ? Influenza A by PCR NEGATIVE NEGATIVE  ? Influenza B by PCR NEGATIVE NEGATIVE  ?Basic metabolic panel  ?Result Value Ref Range  ? Sodium 137 135 - 145 mmol/L  ? Potassium 4.0 3.5 - 5.1 mmol/L  ? Chloride 105 98 - 111 mmol/L  ? CO2 23 22 - 32 mmol/L  ? Glucose, Bld 111 (H) 70 - 99 mg/dL  ? BUN 7 6 - 20 mg/dL  ? Creatinine, Ser 1.03 (H) 0.44 - 1.00 mg/dL  ? Calcium 8.9 8.9 - 10.3 mg/dL  ? GFR, Estimated >60 >60 mL/min  ? Anion gap 9 5 - 15  ?CBC  ?Result Value Ref Range  ? WBC 11.0 (H) 4.0 - 10.5 K/uL  ? RBC 4.62 3.87 - 5.11 MIL/uL  ? Hemoglobin 12.8 12.0 - 15.0 g/dL  ? HCT 39.8 36.0 - 46.0 %  ? MCV 86.1 80.0 - 100.0 fL  ? MCH 27.7 26.0 - 34.0 pg  ? MCHC 32.2 30.0 - 36.0 g/dL  ? RDW 12.6 11.5 - 15.5 %  ?  Platelets 441 (H) 150 - 400 K/uL  ? nRBC 0.0 0.0 - 0.2 %  ?Hepatic function panel  ?Result Value Ref Range  ? Total Protein 7.4 6.5 - 8.1 g/dL  ? Albumin 3.9 3.5 - 5.0 g/dL  ? AST 19 15 - 41 U/L  ? ALT 23 0 - 44  U/L  ? Alkaline Phosphatase 55 38 - 126 U/L  ? Total Bilirubin 0.7 0.3 - 1.2 mg/dL  ? Bilirubin, Direct 0.2 0.0 - 0.2 mg/dL  ? Indirect Bilirubin 0.5 0.3 - 0.9 mg/dL  ?Lipase, blood  ?Result Value Ref Range  ? Lipase 39 11 - 51 U/L  ?I-Stat beta hCG blood, ED  ?Result Value Ref Range  ? I-stat hCG, quantitative <5.0 <5 mIU/mL  ? Comment 3          ?Troponin I (High Sensitivity)  ?Result Value Ref Range  ? Troponin I (High Sensitivity) 2 <18 ng/L  ?Troponin I (High Sensitivity)  ?Result Value Ref Range  ? Troponin I (High Sensitivity) <2 <18 ng/L  ? ?DG Chest 2 View ? ?Result Date: 01/10/2022 ?CLINICAL DATA:  Chest pain EXAM: CHEST - 2 VIEW COMPARISON:  08/28/2021 FINDINGS: Cardiac shadow is at the upper limits of normal in size. No focal infiltrate or effusion is seen. No bony abnormality is noted. IMPRESSION: No active cardiopulmonary disease. Electronically Signed   By: Inez Catalina M.D.   On: 01/10/2022 21:31   ? ? ?EKG ?EKG Interpretation ? ?Date/Time:  Saturday Jan 10 2022 20:18:58 EDT ?Ventricular Rate:  85 ?PR Interval:  144 ?QRS Duration: 72 ?QT Interval:  360 ?QTC Calculation: 428 ?R Axis:   40 ?Text Interpretation: Normal sinus rhythm with sinus arrhythmia Confirmed by Randal Buba, Brunella Wileman ([id number]) on 01/11/2022 12:38:06 AM ? ?Radiology ?DG Chest 2 View ? ?Result Date: 01/10/2022 ?CLINICAL DATA:  Chest pain EXAM: CHEST - 2 VIEW COMPARISON:  08/28/2021 FINDINGS: Cardiac shadow is at the upper limits of normal in size. No focal infiltrate or effusion is seen. No bony abnormality is noted. IMPRESSION: No active cardiopulmonary disease. Electronically Signed   By: Inez Catalina M.D.   On: 01/10/2022 21:31   ? ?Procedures ?Procedures  ? ? ?Medications Ordered in ED ?Medications  ?alum & mag hydroxide-simeth  (MAALOX/MYLANTA) 200-200-20 MG/5ML suspension 30 mL ( Oral Canceled Entry 01/11/22 0117)  ?ondansetron (ZOFRAN-ODT) disintegrating tablet 4 mg (4 mg Oral Given 01/11/22 0021)  ?ketorolac (TORADOL) 30 MG/ML injection 30 mg (30 mg Intr

## 2022-01-13 ENCOUNTER — Telehealth: Payer: Self-pay | Admitting: Cardiology

## 2022-01-13 NOTE — Telephone Encounter (Signed)
Spoke to patient she stated she is getting over pneumonia.Stated since this past Sat she has been having fast heart beat.She is weak and dizzy.She went to ED this past Sat and all test normal.She took a Metoprolol 25 mg 1/2 tablet about 1 hour ago and that has helped.Stated Metoprolol works better than Propranolol.Appointment scheduled with Dr.Tobb 5/17 at 3:30 pm. ?

## 2022-01-13 NOTE — Telephone Encounter (Signed)
STAT if HR is under 50 or over 120 ?(normal HR is 60-100 beats per minute) ? ?What is your heart rate? 100 ? ?Do you have a log of your heart rate readings (document readings)? 120,125 ? ? ?Do you have any other symptoms? Dizzy, weak, sweats, fell like she's going to pass out ? ?

## 2022-01-14 ENCOUNTER — Encounter: Payer: Self-pay | Admitting: Cardiology

## 2022-01-14 ENCOUNTER — Ambulatory Visit: Payer: 59 | Admitting: Cardiology

## 2022-01-14 VITALS — BP 102/72 | HR 78 | Ht 69.0 in | Wt 243.4 lb

## 2022-01-14 DIAGNOSIS — I498 Other specified cardiac arrhythmias: Secondary | ICD-10-CM

## 2022-01-14 DIAGNOSIS — E669 Obesity, unspecified: Secondary | ICD-10-CM | POA: Diagnosis not present

## 2022-01-14 DIAGNOSIS — F419 Anxiety disorder, unspecified: Secondary | ICD-10-CM

## 2022-01-14 DIAGNOSIS — I493 Ventricular premature depolarization: Secondary | ICD-10-CM

## 2022-01-14 MED ORDER — DILTIAZEM HCL ER COATED BEADS 120 MG PO CP24: 120.0000 mg | ORAL_CAPSULE | Freq: Every day | ORAL | 3 refills | Status: DC

## 2022-01-14 NOTE — Patient Instructions (Addendum)
Medication Instructions:  ?Your physician has recommended you make the following change in your medication:  ?START: Cardizem 120 mg once daily ?*If you need a refill on your cardiac medications before your next appointment, please call your pharmacy* ? ? ?Lab Work: ?None ?If you have labs (blood work) drawn today and your tests are completely normal, you will receive your results only by: ?MyChart Message (if you have MyChart) OR ?A paper copy in the mail ?If you have any lab test that is abnormal or we need to change your treatment, we will call you to review the results. ? ? ?Testing/Procedures: ?None ? ? ?Follow-Up: ?At [facility], you and your health needs are our priority.  As part of our continuing mission to provide you with exceptional heart care, we have created designated Provider Care Teams.  These Care Teams include your primary Cardiologist (physician) and Advanced Practice Providers (APPs -  Physician Assistants and Nurse Practitioners) who all work together to provide you with the care you need, when you need it. ? ?We recommend signing up for the patient portal called "MyChart".  Sign up information is provided on this After Visit Summary.  MyChart is used to connect with patients for Virtual Visits (Telemedicine).  Patients are able to view lab/test results, encounter notes, upcoming appointments, etc.  Non-urgent messages can be sent to your provider as well.   ?To learn more about what you can do with MyChart, go to [email or web address].   ? ?Your next appointment:   ?2 month(s) ? ?The format for your next appointment:   ?Virtual Visit  ? ?Provider:   ?Berniece Salines, DO   ? ? ?Other Instructions ? ?[email or web address] ? ? ? ? ? ? ? ?FDA-cleared, clinical grade mobile EKG monitor: Jodelle Red is the most clinically-validated mobile EKG used by the world's leading cardiac care medical professionals With Basic service, know instantly if your heart rhythm is  normal or if atrial fibrillation is detected, and email the last single EKG recording to yourself or your doctor Premium service, available for purchase through the [employer] app for $9.99 per month or $99 per year, includes unlimited history and storage of your EKG recordings, a monthly EKG summary report to share with your doctor, along with the ability to track your blood pressure, activity and weight Includes one [employer]ia[employer] phone clip FREE SHIPPING: Standard delivery 1-3 business days. Orders placed by 11:00am PST will ship that afternoon. Otherwise, will ship next business day. All orders ship via [employer] from [place], [place] ? ?  ?[employer] - sending an EKG ?Download app and set up profile. ?Run EKG - by placing 1-2 fingers on the silver plates ?After EKG is complete - Download PDF  ?- Skip password (if you apply a password the provider will need it to view the EKG) ?Click share button (square with upward arrow) in bottom left corner ?To send: choose MyChart (first time log into MyChart)  ?Pop up window about sending ECG ?Click continue ?Choose type of message ?Choose provider ?Type subject and message ?Click send (EKG should be attached)  ?- To send additional EKGs in one message click the paperclip image and bottom of page to attach.   ? ?Important Information About [employer] ? ? ? ? ?  ?

## 2022-01-14 NOTE — Progress Notes (Signed)
Cardiology Office Note:    Date:  01/16/2022   ID:  Brandy [name], DOB 1986-05-22, MRN [id number]  PCP:  Lujean Amel, MD  Cardiologist:  Berniece Salines, DO  Electrophysiologist:  None   Referring MD: Lujean Amel, MD   " I am scare"   History of Present Illness:    Brandy [name] is a 36 y.o. female with a hx of anxiety and postpartum depression.  The patient was initially seen in consultation on May 31, 2018 for complaints of palpitation and shortness of breath.   During her initial consultation it was noted that the patient had seen Dr. Nehemiah Massed at [facility] clinic and had had previous work-up with a normal echocardiogram (July 2019) and a treadmill SPECT stress test (which was also July 2019) which I was able to review in Care Everywhere.  Her Holter August 29 showed sinus tachycardia.  The conclusion at that visit a 48-hour Holter monitor was placed on patient she was encouraged to continue hydration as well as continue treatment for her anxiety.  Review of the Holter monitors did not show predominant sinus arrhythmia with 1 PAC.  There are no ventricular ectopy or no pauses.  Her average heart rate was 82 bpm.   She did continue to have palpitations therefore a longer monitor was placed on patient for 6 days which showed dominant rhythm of sinus rhythm.  Isolated atrial ventricular ectopy.  12 of her triggered events were associated with sinus rhythm, one was normal sinus rhythm with PVC, one was sinus tachycardia with PVC and one was sinus rhythm with PAC.   Her Jan 03, 2019, was a telemetry medicine visit.  At that time it was discussed with the patient to pursue noncardiac work-up.   I did see the patient on the June 22, 2019 at which time she was experiencing worsening palpitations with associated dizziness. I placed a monitor on the patient at that time and I did see her in December 2020 and discussed the result of her monitor showing symptomatic PVCs. Due to low blood pressure  as needed Lopressor 12.5 was given to the patient.   I saw the patient on November 03, 2019 at that time I started her on Toprol-XL 12.5 mg daily she reported that she was having worsening palpitations.   I saw the patient on March 31, 2020 at that time she was status post COVID-19 infection.  She did have problem with hypoxia in was treated for COVID-19 pneumonia.  During that visit she reported that she still experiencing palpitations I increased her Toprol-XL to 12.5 mg twice daily.  She is here today for follow-up visit.   I saw the patient on November 01, 2020 at that time we discussed her echocardiogram which was normal.  I explained to the patient to continue her propanolol.  She is here today for follow-up visit she called to be seen.  She was recently seen in the emergency department at [facility].  She was having epigastric pain and some chest discomfort.  She tells me that she has had worsening shortness of breath on exertion and significant fatigue.  She therefore went to the ED testing which was done/blood test was normal.  She had abdominal CT scan done.  Unfortunately she did not complete the work-up and evaluation and she left the emergency department.    Since I saw the patient she tells me she had been feeling good and had not had any issues with palpitations therefore she stopped  taking the propanolol.  Recently she was treated for pneumonia.  And she tells me she completed her antibiotic.  She was seen at the emergency department at [facility] on November 10, 2021 after presenting for chest pain.  During her evaluation she noted that she has been experiencing congestion cough nausea and vomiting and diarrhea.  She admitted to having some sick family members.  She was evaluated troponins were negative and EKG at that time showing sinus rhythm with underlying sinus arrhythmia and heart rate 85 bpm.  She was treated and discharged to home.  I saw the patient June 23, 2021 at that time we did a coronary CT scan which was normal with no evidence of coronary artery disease.  In the small left direction consistent with a PFO.  She called the office yesterday reporting that she has had some increasing heart rate.  And felt lightheaded.  She was added to my schedule today.  She is very anxious and concerned in the office today.  Mitts to me that she has had a very hard stressful 3 weeks her niece is dying from glioblastoma at the age of 59, her neighbor just died from a heart attack, a family member was in a car wreck and her mother was in the hospital.  During our encounter she is asking me if she may be having a heart attack I did explain to the patient that based on the lab work which was done is less likely and also we will review her previous testing.  She had started metoprolol which she stopped in the past and states that this was not helping her heart rate either and it makes her very tired.  These were the same reasons why we stopped the metoprolol in the past. She is very anxious in the office in almost to the point of tears.    Past Medical History:  Diagnosis Date   Abdominal pain, epigastric 02/18/2017   Allergy    Anterior neck pain 12/30/2015   Anxiety    Anxiety associated with birthing process 06/14/2018   B12 deficiency 07/21/2016   Back pain    Chest pain    Chronic mixed headache syndrome 06/14/2018   COVID-19 virus infection 03/2020   with infusion    Depression    DUB (dysfunctional uterine bleeding) 06/04/2011   Dysphagia 07/20/2018   Enlarged thyroid gland 12/30/2015   Fatigue 12/30/2015   Gallstones    Generalized anxiety disorder 06/09/2016   GERD (gastroesophageal reflux disease) 02/19/2011   Headache, unspecified headache type 03/05/2018   History of nephrolithiasis    Kidney stone    Medication overuse headache 06/14/2018   Menorrhagia 11/09/2018   Migraine    Missed abortion 11/09/2018   Nausea vomiting and diarrhea 12/28/2013    Nausea without vomiting 02/18/2017   NEPHROLITHIASIS, HX OF 12/16/2007   Qualifier: Diagnosis of  By: Sherren Mocha MD, Jory Ee    Other fatigue    Palpitations 03/21/2018   Pituitary tumor    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   Precordial pain 03/21/2018   Prediabetes    PVC's (premature ventricular contractions)    Routine general medical examination at a health care facility 09/27/2014   Serum calcium elevated 12/28/2013   SOB (shortness of breath) on exertion    SVD (spontaneous vaginal delivery) 02/02/2018   Tendinitis of right ankle 08/14/2014   TMJ syndrome 05/30/2012   Vitamin D deficiency     Past Surgical History:  Procedure Laterality Date  CHOLECYSTECTOMY  2014   KIDNEY STONE SURGERY     x 5   TYMPANOSTOMY TUBE PLACEMENT     WISDOM TOOTH EXTRACTION      Current Medications: Current Meds  Medication Sig   albuterol (VENTOLIN HFA) 108 (90 Base) MCG/ACT inhaler Inhale 1-2 puffs into the lungs every 4 (four) hours as needed for wheezing or shortness of breath.   Cholecalciferol (VITAMIN D) 2000 units CAPS Take 2,000 Units by mouth daily.   clonazePAM (KLONOPIN) 0.5 MG tablet Take 1 tablet (0.5 mg total) by mouth 2 (two) times daily as needed for anxiety.   diltiazem (CARDIZEM CD) 120 MG 24 hr capsule Take 1 capsule (120 mg total) by mouth daily.   esomeprazole (NEXIUM) 40 MG capsule TAKE 1 CAPSULE (40 MG TOTAL) BY MOUTH 2 (TWO) TIMES DAILY BEFORE A MEAL.   FLUoxetine (PROZAC) 40 MG capsule Take 1 capsule (40 mg total) by mouth daily.   melatonin 5 MG TABS Take 5 mg by mouth at bedtime.   mirtazapine (REMERON) 7.5 MG tablet Take 1 tablet (7.5 mg total) by mouth at bedtime as needed. (Patient taking differently: Take 7.5 mg by mouth at bedtime as needed (sleep).)   ondansetron (ZOFRAN ODT) 8 MG disintegrating tablet Take 1 tablet (8 mg total) by mouth every 8 (eight) hours as needed for nausea or vomiting.   ondansetron (ZOFRAN) 8 MG tablet Take 1 tablet (8 mg total) by mouth  every 8 (eight) hours as needed for nausea.   Probiotic Product (PROBIOTIC PO) Take 1 capsule by mouth daily.   promethazine-dextromethorphan (PROMETHAZINE-DM) 6.25-15 MG/5ML syrup Take 5 mLs by mouth every 4 (four) hours as needed for cough.   propranolol (INDERAL) 20 MG tablet Take 1 tablet (20 mg total) by mouth every 8 (eight) hours. (Patient taking differently: Take 20 mg by mouth 2 (two) times daily as needed (heart rate).)     Allergies:   Metoclopramide, Penicillins, Erenumab-aooe, Fluvoxamine, Penicillin g benzathine, Sumatriptan, and Zoloft [sertraline hcl]   Social History   Socioeconomic History   Marital status: Married    Spouse name: Jonathon   Number of children: 1   Years of education: Not on file   Highest education level: Bachelor's degree (e.g., BA, AB, BS)  Occupational History   Occupation: customer care representative  Tobacco Use   Smoking status: Never   Smokeless tobacco: Never  Vaping Use   Vaping Use: Never used  Substance and Sexual Activity   Alcohol use: Not Currently   Drug use: No   Sexual activity: Yes  Other Topics Concern   Not on file  Social History Narrative   Patient is right-handed. She lives with her husband and child in a split-level home.    Daughter born 01/2018   She avoids caffeine.    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   Social Determinants of Health   Financial Resource Strain: Not on file  Food Insecurity: Not on file  Transportation Needs: Not on file  Physical Activity: Not on file  Stress: Not on file  Social Connections: Not on file     Family History: The patient's family history includes Anxiety disorder in her mother and sister; Arthritis in her mother; Asthma in her mother; Breast cancer in her maternal aunt; Cancer in her father and mother; Colon cancer in her maternal grandmother; Colon polyps in her mother; Depression  in her  mother; Diabetes in her father, maternal uncle, maternal uncle, mother, paternal aunt, and paternal grandmother; Heart disease in her mother; Hyperlipidemia in her father; Hypertension in her father and mother; Lung cancer in her maternal aunt; Mental illness in her mother; Obesity in her mother; Sleep apnea in her father; Stomach cancer in her mother; Thyroid disease in her mother. There is no history of Esophageal cancer or Rectal cancer.  ROS:   Review of Systems  Constitution: Negative for decreased appetite, fever and weight gain.  HENT: Negative for congestion, ear discharge, hoarse voice and sore throat.   Eyes: Negative for discharge, redness, vision loss in right eye and visual halos.  Cardiovascular: Negative for chest pain, dyspnea on exertion, leg swelling, orthopnea and palpitations.  Respiratory: Negative for cough, hemoptysis, shortness of breath and snoring.   Endocrine: Negative for heat intolerance and polyphagia.  Hematologic/Lymphatic: Negative for bleeding problem. Does not bruise/bleed easily.  Skin: Negative for flushing, nail changes, rash and suspicious lesions.  Musculoskeletal: Negative for arthritis, joint pain, muscle cramps, myalgias, neck pain and stiffness.  Gastrointestinal: Negative for abdominal pain, bowel incontinence, diarrhea and excessive appetite.  Genitourinary: Negative for decreased libido, genital sores and incomplete emptying.  Neurological: Negative for brief paralysis, focal weakness, headaches and loss of balance.  Psychiatric/Behavioral: Negative for altered mental status, depression and suicidal ideas.  Allergic/Immunologic: Negative for HIV exposure and persistent infections.    EKGs/Labs/Other Studies Reviewed:    The following studies were reviewed today:   EKG:  The ekg ordered today demonstrates sinus rhythm, heart rate 75 bpm with underlying sinus arrhythmia.  CT scan July 03, 2019 FINDINGS: Quality: Good, HR 64   Coronary  calcium score: The patient's coronary artery calcium score is 0, which places the patient in the 0 percentile.   Coronary arteries: Normal coronary origins.  Right dominance.   Right Coronary Artery: Dominant.  Normal artery.   Left Main Coronary Artery: Normal. Bifurcates into the LAD and LCx arteries.   Left Anterior Descending Coronary Artery: Anterior vessel that reaches the apex. No disease.   Left Circumflex Artery: AV groove vessel. No significant proximal to mid vessel disease. The distal vessel is not well-visualized.   Aorta: Normal size, 25 mm at the mid ascending aorta (level of the PA bifurcation) measured double oblique. No calcifications. No dissection.   Aortic Valve: Trileaflet. No calcifications.   Other findings:   Normal pulmonary vein drainage into the left atrium.   Normal left atrial appendage without a thrombus.   Normal size of the pulmonary artery.   PFO with left to right flow is noted - see cines on PACS   IMPRESSION: 1. No evidence of CAD, CADRADS = 0.   2. Coronary calcium score of 0. This was 0 percentile for age and sex matched control.   3. Normal coronary origin with right dominance.   4. PFO with left to right contrast flow noted on cine imaging.   5. Consider non-coronary causes of chest pain     Electronically Signed   By: Pixie Casino M.D.   On: 07/02/2021 15:49    Addended by Pixie Casino, MD on 07/02/2021  3:51 PM   Study Result  Narrative & Impression  EXAM: OVER-READ INTERPRETATION  CT CHEST   The following report is an over-read performed by radiologist Dr. Lorin Picket of [facility], [place] on 07/02/2021. This over-read does not include interpretation of cardiac or coronary anatomy or pathology. The coronary calcium score/coronary  CTA interpretation by the cardiologist is attached.   COMPARISON:  CT chest 04/04/2020.   FINDINGS: Vascular: None.   Mediastinum/Nodes: None.   Lungs/Pleura: Mild  dependent atelectasis bilaterally. Lungs are otherwise clear. No pleural fluid.   Upper Abdomen: None.   Musculoskeletal: None.   IMPRESSION: No acute extracardiac findings.   Electronically Signed: By: Lorin Picket M.D. On: 07/02/2021 12:43    Echo 2022 IMPRESSIONS   1. Left ventricular ejection fraction, by estimation, is 55 to 60%. The  left ventricle has normal function. The left ventricle has no regional  wall motion abnormalities. Left ventricular diastolic parameters were  normal.   2. Right ventricular systolic function is normal. The right ventricular  size is normal. There is normal pulmonary artery systolic pressure.   3. The mitral valve is normal in structure. No evidence of mitral valve  regurgitation. No evidence of mitral stenosis.   4. The aortic valve is normal in structure. Aortic valve regurgitation is  not visualized. No aortic stenosis is present.   5. The inferior vena cava is normal in size with greater than 50%  respiratory variability, suggesting right atrial pressure of 3 mmHg.   FINDINGS   Left Ventricle: Left ventricular ejection fraction, by estimation, is 55  to 60%. The left ventricle has normal function. The left ventricle has no  regional wall motion abnormalities. The left ventricular internal cavity  size was normal in size. There is   no left ventricular hypertrophy. Left ventricular diastolic parameters  were normal.   Right Ventricle: The right ventricular size is normal. No increase in  right ventricular wall thickness. Right ventricular systolic function is  normal. There is normal pulmonary artery systolic pressure. The tricuspid  regurgitant velocity is 2.17 m/s, and   with an assumed right atrial pressure of 3 mmHg, the estimated right  ventricular systolic pressure is 20.2 mmHg.   Left Atrium: Left atrial size was normal in size.   Right Atrium: Right atrial size was normal in size.   Pericardium: There is no evidence of  pericardial effusion.   Mitral Valve: The mitral valve is normal in structure. No evidence of  mitral valve regurgitation. No evidence of mitral valve stenosis.   Tricuspid Valve: The tricuspid valve is normal in structure. Tricuspid  valve regurgitation is trivial. No evidence of tricuspid stenosis.   Aortic Valve: The aortic valve is normal in structure. Aortic valve  regurgitation is not visualized. No aortic stenosis is present.   Pulmonic Valve: The pulmonic valve was normal in structure. Pulmonic valve  regurgitation is not visualized. No evidence of pulmonic stenosis.   Aorta: The aortic root is normal in size and structure.   Venous: The inferior vena cava is normal in size with greater than 50%  respiratory variability, suggesting right atrial pressure of 3 mmHg.   IAS/Shunts: No atrial level shunt detected by color flow Doppler.    06/23/2019 FINDINGS: Non-cardiac: See separate report from [facility].   Ascending Aorta: Normal size, no calcifications.   Pericardium: Normal.   Coronary arteries: Normal origin.   IMPRESSION: Coronary calcium score of 0. This was 0 percentile for age and sex matched control.  Recent Labs: 09/30/2021: TSH 1.59 01/10/2022: ALT 23; BUN 7; Creatinine, Ser 1.03; Hemoglobin 12.8; Platelets 441; Potassium 4.0; Sodium 137  Recent Lipid Panel    Component Value Date/Time   CHOL 167 09/30/2021 0733   CHOL 181 12/26/2020 0847   TRIG 165.0 (H) 09/30/2021 0733   HDL 45.50  09/30/2021 0733   HDL 40 12/26/2020 0847   CHOLHDL 4 09/30/2021 0733   VLDL 33.0 09/30/2021 0733   LDLCALC 89 09/30/2021 0733   LDLCALC 122 (H) 12/26/2020 0847    Physical Exam:    VS:  BP 102/72   Pulse 78   Ht '5\' 9"'$  (1.753 m)   Wt 243 lb 6.4 oz (110.4 kg)   LMP 01/07/2022   SpO2 99%   BMI 35.94 kg/m     Wt Readings from Last 3 Encounters:  01/14/22 243 lb 6.4 oz (110.4 kg)  01/10/22 245 lb 6 oz (111.3 kg)  09/30/21 246 lb 8 oz (111.8 kg)      GEN: Well nourished, well developed in no acute distress HEENT: Normal NECK: No JVD; No carotid bruits LYMPHATICS: No lymphadenopathy CARDIAC: S1S2 noted,RRR, no murmurs, rubs, gallops RESPIRATORY:  Clear to auscultation without rales, wheezing or rhonchi  ABDOMEN: Soft, non-tender, non-distended, +bowel sounds, no guarding. EXTREMITIES: No edema, No cyanosis, no clubbing MUSCULOSKELETAL:  No deformity  SKIN: Warm and dry NEUROLOGIC:  Alert and oriented x 3, non-focal PSYCHIATRIC:  Normal affect, good insight  ASSESSMENT:    1. Symptomatic PVCs   2. Sinus arrhythmia   3. Obesity (BMI 30-39.9)   4. Anxiety    PLAN:     1.  She tells me she stopped her propanolol because it made her sweaty, in the past she had stopped her metoprolol because she has significant fatigue from it.  The last couple days she has been taking the metoprolol and tells me that she feels the fatigue again.  Attempted to stop both medications started patient on low-dose Cardizem 120 mg daily.  I will have the patient follow-up in 2 weeks and if she still have symptoms on the diltiazem my plan will be to add flecainide 50 mg twice daily to her regimen.  2.  The vomiting has resolved she has had some nausea.  She is going to see her primary care doctor I have asked the patient if this does not improve she may need to see GI.  3.  I also think that her symptoms are being significantly exacerbated by her anxiety.  She is very anxious.  That has been going on around her in the last 2 weeks.  She tells me she is talking to the therapist in the upcoming days.  4.  The patient understands the need to lose weight with diet and exercise. We have discussed specific strategies for this.  The patient is in agreement with the above plan. The patient left the office in stable condition.  The patient will follow up in   Medication Adjustments/Labs and Tests Ordered: Current medicines are reviewed at length with the patient  today.  Concerns regarding medicines are outlined above.  Orders Placed This Encounter  Procedures   EKG 12-Lead   Meds ordered this encounter  Medications   diltiazem (CARDIZEM CD) 120 MG 24 hr capsule    Sig: Take 1 capsule (120 mg total) by mouth daily.    Dispense:  90 capsule    Refill:  3    Patient Instructions  Medication Instructions:  Your physician has recommended you make the following change in your medication:  START: Cardizem 120 mg once daily *If you need a refill on your cardiac medications before your next appointment, please call your pharmacy*   Lab Work: None If you have labs (blood work) drawn today and your tests are completely normal, you will receive  your results only by: [place] (if you have MyChart) OR A paper copy in the mail If you have any lab test that is abnormal or we need to change your treatment, we will call you to review the results.   Testing/Procedures: None   Follow-Up: At [facility], you and your health needs are our priority.  As part of our continuing mission to provide you with exceptional heart care, we have created designated Provider Care Teams.  These Care Teams include your primary Cardiologist (physician) and Advanced Practice Providers (APPs -  Physician Assistants and Nurse Practitioners) who all work together to provide you with the care you need, when you need it.  We recommend signing up for the patient portal called "MyChart".  Sign up information is provided on this After Visit Summary.  MyChart is used to connect with patients for Virtual Visits (Telemedicine).  Patients are able to view lab/test results, encounter notes, upcoming appointments, etc.  Non-urgent messages can be sent to your provider as well.   To learn more about what you can do with MyChart, go to [email or web address].    Your next appointment:   2 month(s)  The format for your next appointment:   Virtual Visit   Provider:   Berniece Salines, DO     Other Instructions  [email or web address] [email or web address]        FDA-cleared, clinical grade mobile EKG monitor: Jodelle Red is the most clinically-validated mobile EKG used by the world's leading cardiac care medical professionals With Basic service, know instantly if your heart rhythm is normal or if atrial fibrillation is detected, and email the last single EKG recording to yourself or your doctor Premium service, available for purchase through the Kardia app for $9.99 per month or $99 per year, includes unlimited history and storage of your EKG recordings, a monthly EKG summary report to share with your doctor, along with the ability to track your blood pressure, activity and weight Includes one KardiaMobile phone clip FREE SHIPPING: Standard delivery 1-3 business days. Orders placed by 11:00am PST will ship that afternoon. Otherwise, will ship next business day. All orders ship via [employer] from [place], [place] - sending an EKG [employer] and set up profile. Run EKG - by placing 1-2 fingers on the silver plates After EKG is complete - Download PDF  - Skip password (if you apply a password the provider will need it to view the EKG) Click share button (square with upward arrow) in bottom left corner To send: choose MyChart (first time log into MyChart)  Pop up window about sending ECG Click continue Choose type of message Choose provider Type subject and message Click send (EKG should be attached)  - To send additional EKGs in one message click the paperclip image and bottom of page to attach.    Important Information About Sugar         Adopting a Healthy Lifestyle.  Know what a healthy weight is for you (roughly BMI <25) and aim to maintain this   Aim for 7+ servings of fruits and vegetables daily   65-80+ fluid ounces of water or unsweet tea for healthy kidneys   Limit to max 1 drink of alcohol per day; avoid  smoking/tobacco   Limit animal fats in diet for cholesterol and heart health - choose grass fed whenever available   Avoid highly processed foods, and foods high in saturated/trans fats   Aim for low stress - take time  to unwind and care for your mental health   Aim for 150 min of moderate intensity exercise weekly for heart health, and weights twice weekly for bone health   Aim for 7-9 hours of sleep daily   When it comes to diets, agreement about the perfect plan isnt easy to find, even among the experts. Experts at the [employer] developed an idea known as the Healthy Eating Plate. Just imagine a plate divided into logical, healthy portions.   The emphasis is on diet quality:   Load up on vegetables and fruits - one-half of your plate: Aim for color and variety, and remember that potatoes dont count.   Go for whole grains - one-quarter of your plate: Whole wheat, barley, wheat berries, quinoa, oats, brown rice, and foods made with them. If you want pasta, go with whole wheat pasta.   Protein power - one-quarter of your plate: Fish, chicken, beans, and nuts are all healthy, versatile protein sources. Limit red meat.   The diet, however, does go beyond the plate, offering a few other suggestions.   Use healthy plant oils, such as olive, canola, soy, corn, sunflower and peanut. Check the labels, and avoid partially hydrogenated oil, which have unhealthy trans fats.   If youre thirsty, drink water. Coffee and tea are good in moderation, but skip sugary drinks and limit milk and dairy products to one or two daily servings.   The type of carbohydrate in the diet is more important than the amount. Some sources of carbohydrates, such as vegetables, fruits, whole grains, and beans-are healthier than others.   Finally, stay active  Signed, Berniece Salines, DO  01/16/2022 8:40 PM    [facility]

## 2022-01-16 ENCOUNTER — Encounter: Payer: Self-pay | Admitting: Cardiology

## 2022-01-24 ENCOUNTER — Other Ambulatory Visit: Payer: Self-pay | Admitting: Physician Assistant

## 2022-01-27 ENCOUNTER — Ambulatory Visit: Payer: 59 | Admitting: Cardiology

## 2022-01-27 ENCOUNTER — Telehealth: Payer: Self-pay

## 2022-01-27 NOTE — Telephone Encounter (Signed)
Called pt to let her know she is to be seen in 2 months via MyChart not today. No answer. Left message.

## 2022-01-28 ENCOUNTER — Ambulatory Visit: Payer: 59 | Admitting: Family Medicine

## 2022-01-29 ENCOUNTER — Ambulatory Visit: Payer: 59 | Admitting: Family Medicine

## 2022-01-29 ENCOUNTER — Encounter: Payer: Self-pay | Admitting: Family Medicine

## 2022-01-29 VITALS — BP 116/88 | HR 114 | Temp 98.7°F

## 2022-01-29 DIAGNOSIS — F321 Major depressive disorder, single episode, moderate: Secondary | ICD-10-CM | POA: Diagnosis not present

## 2022-01-29 DIAGNOSIS — R002 Palpitations: Secondary | ICD-10-CM

## 2022-01-29 DIAGNOSIS — R7303 Prediabetes: Secondary | ICD-10-CM

## 2022-01-29 DIAGNOSIS — N926 Irregular menstruation, unspecified: Secondary | ICD-10-CM

## 2022-01-29 DIAGNOSIS — F419 Anxiety disorder, unspecified: Secondary | ICD-10-CM | POA: Diagnosis not present

## 2022-01-29 DIAGNOSIS — R0602 Shortness of breath: Secondary | ICD-10-CM

## 2022-01-29 LAB — POCT URINALYSIS DIPSTICK
Bilirubin, UA: NEGATIVE
Blood, UA: NEGATIVE
Glucose, UA: NEGATIVE
Ketones, UA: NEGATIVE
Leukocytes, UA: NEGATIVE
Nitrite, UA: NEGATIVE
Protein, UA: NEGATIVE
Spec Grav, UA: 1.025 (ref 1.010–1.025)
Urobilinogen, UA: NEGATIVE E.U./dL — AB
pH, UA: 6 (ref 5.0–8.0)

## 2022-01-29 LAB — POCT URINE PREGNANCY: Preg Test, Ur: NEGATIVE

## 2022-01-29 LAB — GLUCOSE, POCT (MANUAL RESULT ENTRY): POC Glucose: 115 mg/dl — AB (ref 70–99)

## 2022-01-29 NOTE — Progress Notes (Signed)
Subjective:    Patient ID: Brandy [name], female    DOB: 02-May-1986, 36 y.o.   MRN: [id number]  Chief Complaint  Patient presents with   glucose    Has been high, been sweating really bad. Pain between shoulder blades, and in chest. Waking up sweating, stands up and starts sweating, having SOB.     HPI Patient was seen today for several ongoing concerns.  Pt endorses SOB with walking, diaphoresis, pain between shoulder blades, tachycardia and CP x a while, worse x last few wks.  Pt states she is also concerned about her fasting bs being 156 and 146.  Pt dx'd with prediabetes.  Working on diet and exercise.  Using parents glucometer to check blood sugar.  Patient notes h/o anxiety.  Endorses increased stress 2/2 several deaths and illnesses in the family within the last month.  Patient states she recently had initial counseling visit.   Pt mentions to CMA, menses several days late and she has a lot going on including her daughter's birthday on Monday.   Pt's husband s/p vasectomy. Past Medical History:  Diagnosis Date   Abdominal pain, epigastric 02/18/2017   Allergy    Anterior neck pain 12/30/2015   Anxiety    Anxiety associated with birthing process 06/14/2018   B12 deficiency 07/21/2016   Back pain    Chest pain    Chronic mixed headache syndrome 06/14/2018   COVID-19 virus infection 03/2020   with infusion    Depression    DUB (dysfunctional uterine bleeding) 06/04/2011   Dysphagia 07/20/2018   Enlarged thyroid gland 12/30/2015   Fatigue 12/30/2015   Gallstones    Generalized anxiety disorder 06/09/2016   GERD (gastroesophageal reflux disease) 02/19/2011   Headache, unspecified headache type 03/05/2018   History of nephrolithiasis    Kidney stone    Medication overuse headache 06/14/2018   Menorrhagia 11/09/2018   Migraine    Missed abortion 11/09/2018   Nausea vomiting and diarrhea 12/28/2013   Nausea without vomiting 02/18/2017   NEPHROLITHIASIS, HX OF 12/16/2007   Qualifier:  Diagnosis of  By: Sherren Mocha MD, Jory Ee    Other fatigue    Palpitations 03/21/2018   Pituitary tumor    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   Precordial pain 03/21/2018   Prediabetes    PVC's (premature ventricular contractions)    Routine general medical examination at a health care facility 09/27/2014   Serum calcium elevated 12/28/2013   SOB (shortness of breath) on exertion    SVD (spontaneous vaginal delivery) 02/02/2018   Tendinitis of right ankle 08/14/2014   TMJ syndrome 05/30/2012   Vitamin D deficiency     Allergies  Allergen Reactions   Metoclopramide Palpitations and Other (See Comments)    Rapid heart rate   Penicillins Hives, Rash and Other (See Comments)    Childhood reaction Has patient had a PCN reaction causing immediate rash, facial/tongue/throat swelling, SOB or lightheadedness with hypotension: No Has patient had a PCN reaction causing severe rash involving mucus membranes or skin necrosis: No Has patient had a PCN reaction that required hospitalization No Has patient had a PCN reaction occurring within the last 10 years: No If all of the above answers are "NO", then may proceed with Cephalosporin use.   Erenumab-Aooe Other (See Comments)    Other reaction(s): Myalgias (intolerance) Constipation, hair loss   Fluvoxamine Other (See Comments)    Other reaction(s): Fainted   Penicillin G Benzathine Other (See Comments)   Sumatriptan Palpitations   Zoloft [Sertraline  Hcl] Anxiety    ROS General: Denies fever, chills, night sweats, changes in weight, changes in appetite  + diaphoresis HEENT: Denies headaches, ear pain, changes in vision, rhinorrhea, sore throat CV: Denies orthopnea  +CP, palpitations, tachycardia, SOB Pulm: Denies cough, wheezing  + SOB GI: Denies abdominal pain, nausea, vomiting, diarrhea, constipation GU: Denies dysuria, hematuria, frequency, vaginal discharge Msk: Denies muscle cramps, joint pains  + back pain Neuro: Denies weakness,  numbness, tingling Skin: Denies rashes, bruising Psych: Denies depression, hallucinations  + anxiety     Objective:    Blood pressure 116/88, pulse (!) 114, temperature 98.7 F (37.1 C), temperature source Oral, last menstrual period 01/07/2022, SpO2 98 %.  Gen. Pleasant, well-nourished, anxious, tearful   HEENT: /AT, face symmetric, conjunctiva clear, no scleral icterus, PERRLA, EOMI, nares patent without drainage, pharynx without erythema or exudate. Neck: No JVD, no thyromegaly Lungs: no accessory muscle use, CTAB, no wheezes or rales Cardiovascular tachycardia, no m/r/g, no focal peripheral edema Musculoskeletal: No deformities, no cyanosis or clubbing, normal tone Neuro:  A&Ox3, CN II-XII intact, normal gait Skin:  Warm, no lesions/ rash   Wt Readings from Last 3 Encounters:  01/14/22 243 lb 6.4 oz (110.4 kg)  01/10/22 245 lb 6 oz (111.3 kg)  09/30/21 246 lb 8 oz (111.8 kg)    Lab Results  Component Value Date   WBC 11.0 (H) 01/10/2022   HGB 12.8 01/10/2022   HCT 39.8 01/10/2022   PLT 441 (H) 01/10/2022   GLUCOSE 111 (H) 01/10/2022   CHOL 167 09/30/2021   TRIG 165.0 (H) 09/30/2021   HDL 45.50 09/30/2021   LDLCALC 89 09/30/2021   ALT 23 01/10/2022   AST 19 01/10/2022   NA 137 01/10/2022   K 4.0 01/10/2022   CL 105 01/10/2022   CREATININE 1.03 (H) 01/10/2022   BUN 7 01/10/2022   CO2 23 01/10/2022   TSH 1.59 09/30/2021   HGBA1C 6.1 09/30/2021      01/29/2022    1:17 PM 09/30/2021    7:09 AM 12/26/2020    7:49 AM  Depression screen PHQ 2/9  Decreased Interest '2 1 3  '$ Down, Depressed, Hopeless '2 1 1  '$ PHQ - 2 Score '4 2 4  '$ Altered sleeping 2 0 0  Tired, decreased energy '3 3 1  '$ Change in appetite 2 0 3  Feeling bad or failure about yourself  2 1 0  Trouble concentrating 2 0 1  Moving slowly or fidgety/restless 0 0 0  Suicidal thoughts   0  PHQ-9 Score '15 6 9  '$ Difficult doing work/chores Somewhat difficult Somewhat difficult Not difficult at all       01/29/2022    2:26 PM  GAD 7 : Generalized Anxiety Score  Nervous, Anxious, on Edge 2  Control/stop worrying 3  Worry too much - different things 2  Trouble relaxing 2  Restless 0  Easily annoyed or irritable 0  Afraid - awful might happen 3  Total GAD 7 Score 12  Anxiety Difficulty Very difficult    Assessment/Plan:  Anxiety  -GAD7 score 12 this visit -continue current medications including remeron, prozac, and klonopin -continue counseling -Given precautions - Plan: TSH, T4, Free  Prediabetes  -Hemoglobin A1c 6.1% on 09/30/21 -continue lifestyle modifications - Plan: POCT urinalysis dipstick, POC Glucose (CBG)  Missed menses  -Urine hCG negative - Plan: POCT urine pregnancy  Depression, major, single episode, moderate (HCC) -phq 9 score 15 this visit -Continue current medications -Continue counseling -Given precautions  SOB (shortness of breath) -Patient reassured as lungs clear on exam -Possible residual symptoms from pna a month ago vs deconditioning. -6MWT done without difficulty  Palpitations -per chart review, pt followed by Cardiology, last seen 01/14/22.  Has h/o symptomatic PVCs. -continue diltiazem 120 mg dialy -continue f/u with Cardiology and PCP  - Plan: TSH, T4, Free, CBC with Differential/Platelet  We will have patient follow-up next week with PCP for further evaluate ongoing concerns.  For worsening symptoms proceed to nearest emergency department  Grier Mitts, MD

## 2022-01-29 NOTE — Patient Instructions (Signed)
As testing was negative today we will have you follow-up in 1 week with your PCP for further evaluation of symptoms.  For worsening symptoms over the next week proceed to nearest emergency department.

## 2022-01-30 ENCOUNTER — Other Ambulatory Visit: Payer: Self-pay | Admitting: Cardiology

## 2022-01-30 ENCOUNTER — Telehealth: Payer: Self-pay | Admitting: Cardiology

## 2022-01-30 ENCOUNTER — Other Ambulatory Visit: Payer: Self-pay

## 2022-01-30 ENCOUNTER — Other Ambulatory Visit (INDEPENDENT_AMBULATORY_CARE_PROVIDER_SITE_OTHER): Payer: 59

## 2022-01-30 DIAGNOSIS — R739 Hyperglycemia, unspecified: Secondary | ICD-10-CM

## 2022-01-30 LAB — CBC WITH DIFFERENTIAL/PLATELET
Basophils Absolute: 0 10*3/uL (ref 0.0–0.1)
Basophils Relative: 0.1 % (ref 0.0–3.0)
Eosinophils Absolute: 0.1 10*3/uL (ref 0.0–0.7)
Eosinophils Relative: 0.8 % (ref 0.0–5.0)
HCT: 38.1 % (ref 36.0–46.0)
Hemoglobin: 12.6 g/dL (ref 12.0–15.0)
Lymphocytes Relative: 15.1 % (ref 12.0–46.0)
Lymphs Abs: 1.3 10*3/uL (ref 0.7–4.0)
MCHC: 33.1 g/dL (ref 30.0–36.0)
MCV: 84.9 fl (ref 78.0–100.0)
Monocytes Absolute: 0.6 10*3/uL (ref 0.1–1.0)
Monocytes Relative: 6.4 % (ref 3.0–12.0)
Neutro Abs: 6.9 10*3/uL (ref 1.4–7.7)
Neutrophils Relative %: 77.6 % — ABNORMAL HIGH (ref 43.0–77.0)
Platelets: 303 10*3/uL (ref 150.0–400.0)
RBC: 4.49 Mil/uL (ref 3.87–5.11)
RDW: 14.5 % (ref 11.5–15.5)
WBC: 8.9 10*3/uL (ref 4.0–10.5)

## 2022-01-30 LAB — T4, FREE: Free T4: 0.95 ng/dL (ref 0.60–1.60)

## 2022-01-30 LAB — TSH: TSH: 1 u[IU]/mL (ref 0.35–5.50)

## 2022-01-30 LAB — HEMOGLOBIN A1C: Hgb A1c MFr Bld: 5.9 % (ref 4.6–6.5)

## 2022-01-30 MED ORDER — FLECAINIDE ACETATE 50 MG PO TABS: 50.0000 mg | ORAL_TABLET | Freq: Two times a day (BID) | ORAL | 11 refills | Status: DC

## 2022-01-30 NOTE — Telephone Encounter (Signed)
Pt updated with MD's recommendations and questioning should she continue to take propranolol 1 tablet (AM) 1/2 tablet (afternoon), 1 table (PM).  Tobb, Kardie, DO  You; Newby, Jasmine M, RN 8 minutes ago (2:31 PM)   Please start Flecainide 50 mg BID

## 2022-01-30 NOTE — Telephone Encounter (Signed)
Attempted to contact pt. Unable to leave message as mailbox is full  Tobb, Santo, DO  You; [employer], [place], RN Just now (2:59 PM)   Please have her take a half a pill of propanolol daily with the flecainide.

## 2022-01-30 NOTE — Telephone Encounter (Signed)
Pt called stating she attempted to take diltiazem for 4 days but it made her really sick and dizzy. Pt state she has since starting taking propanolol 1 tablet in the morning, 1/2 tablet in the afternoon, and 1 tablet a night. Pt state her BP are soft so she is scared to take more. Pt state she just experienced an episode of fast heart rate ranging between 120-140. Current HR 90. However, pt state she is currently dizzy, feel weak, and experiencing chest heaviness.     100/60  90/60 115/80  Will forward to MD for recommendations. Pt also made aware of ED precaution should any new symptoms develop or worsen.

## 2022-01-30 NOTE — Telephone Encounter (Signed)
STAT if HR is under 50 or over 120 (normal HR is 60-100 beats per minute)  What is your heart rate? 120-140  Do you have a log of your heart rate readings (document readings)? yes  Do you have any other symptoms? Dizziness, palpitations, very weak, pre-syncope, nauseated

## 2022-01-30 NOTE — Telephone Encounter (Signed)
Pt c/o medication issue:  1. Name of Medication: flecainide (TAMBOCOR) 50 MG tablet FLUoxetine (PROZAC) 40 MG capsule  2. How are you currently taking this medication (dosage and times per day)?   3. Are you having a reaction (difficulty breathing--STAT)?   4. What is your medication issue? Pharmacy calling to state that these medications have a reaction when taken together and want to make sure the doctor was aware and get clarification on if this is what they want the patient to take. Please advise.

## 2022-02-02 NOTE — Telephone Encounter (Signed)
Message sent via MyChart.

## 2022-02-03 MED ORDER — FLECAINIDE ACETATE 50 MG PO TABS: 50.0000 mg | ORAL_TABLET | Freq: Two times a day (BID) | ORAL | 11 refills | Status: DC

## 2022-02-03 NOTE — Telephone Encounter (Signed)
Pt is on many QTc prolonging medications - flecainide, fluoxetine, mirtazapine, and ondansetron. If she starts flecainide (was prescribed 4 days ago), would recommend close EKG follow up for monitoring.

## 2022-02-03 NOTE — Telephone Encounter (Signed)
-  Pharmacy and pt made aware -Nurse visit scheduled for 6/21 at 11:0 am for EKG check  Tobb, Kardie, DO  Truddie Hidden, RN 18 minutes ago (2:38 PM)   Please have the patient come in for EKG in 2 weeks

## 2022-02-03 NOTE — Telephone Encounter (Signed)
Pharmacy called again to report the medication interaction. They did nto receive a response as to whether or not the patient should still take both. Please advise

## 2022-02-04 NOTE — Telephone Encounter (Signed)
Called pt to speak with her about the medication. She does not want to take Flecainide. Pt wants to transfer to Dr. Thompson Grayer for a second opinion. Will get message to Dr. Harriet Masson and Dr. Rayann Heman for review.  Flecainide removed from medication list.

## 2022-02-04 NOTE — Telephone Encounter (Signed)
Called pt, no answer, left detailed message

## 2022-02-04 NOTE — Addendum Note (Signed)
Addended by: Orvan July on: 02/04/2022 05:08 PM   Modules accepted: Orders

## 2022-02-04 NOTE — Telephone Encounter (Signed)
Pt is returning call and requesting call back. Also, she says that she would like to discuss transfer of doctors.

## 2022-02-05 ENCOUNTER — Other Ambulatory Visit: Payer: Self-pay | Admitting: Physician Assistant

## 2022-02-05 ENCOUNTER — Encounter: Payer: Self-pay | Admitting: Physician Assistant

## 2022-02-05 ENCOUNTER — Telehealth (INDEPENDENT_AMBULATORY_CARE_PROVIDER_SITE_OTHER): Payer: 59 | Admitting: Physician Assistant

## 2022-02-05 DIAGNOSIS — F4320 Adjustment disorder, unspecified: Secondary | ICD-10-CM

## 2022-02-05 DIAGNOSIS — F410 Panic disorder [episodic paroxysmal anxiety] without agoraphobia: Secondary | ICD-10-CM | POA: Diagnosis not present

## 2022-02-05 DIAGNOSIS — F411 Generalized anxiety disorder: Secondary | ICD-10-CM | POA: Diagnosis not present

## 2022-02-05 DIAGNOSIS — F4321 Adjustment disorder with depressed mood: Secondary | ICD-10-CM | POA: Diagnosis not present

## 2022-02-05 DIAGNOSIS — E559 Vitamin D deficiency, unspecified: Secondary | ICD-10-CM

## 2022-02-05 MED ORDER — BUPROPION HCL ER (SR) 100 MG PO TB12: ORAL_TABLET | ORAL | 1 refills | Status: DC

## 2022-02-05 MED ORDER — VITAMIN D (ERGOCALCIFEROL) 1.25 MG (50000 UNIT) PO CAPS: 50000.0000 [IU] | ORAL_CAPSULE | ORAL | 1 refills | Status: DC

## 2022-02-05 NOTE — Progress Notes (Unsigned)
[facility] Check  Patient ID: Mikea Quadros,  MRN: [id number]  PCP: [place], Betty G, MD  Date of Evaluation: 02/05/2022 Time spent:30 minutes  Chief Complaint:   Virtual Visit via Telehealth  I connected with patient by a video enabled telemedicine application with their informed consent, and verified patient privacy and that I am speaking with the correct person using two identifiers.  I am private, in my office and the patient is at work.  I discussed the limitations, risks, security and privacy concerns of performing an evaluation and management service by video and the availability of in person appointments. I also discussed with the patient that there may be a patient responsible charge related to this service. The patient expressed understanding and agreed to proceed.   I discussed the assessment and treatment plan with the patient. The patient was provided an opportunity to ask questions and all were answered. The patient agreed with the plan and demonstrated an understanding of the instructions.   The patient was advised to call back or seek an in-person evaluation if the symptoms worsen or if the condition fails to improve as anticipated.  I provided 30 minutes of non-face-to-face time during this encounter.  HISTORY/CURRENT STATUS: Routine med check.   Even more bad things are happening since last visit.   Husbands aunt was killed in car wreck.  Mom has taken a turn for the worst.  Lanell Matar is   Had pneumonia    Patient denies increased energy with decreased need for sleep, no increased talkativeness, no racing thoughts, no impulsivity or risky behaviors, no increased spending, no increased libido, no grandiosity, no increased irritability or anger, and no hallucinations.  Denies dizziness, syncope, seizures, numbness, tingling, tremor, tics, unsteady gait, slurred speech, confusion. Denies muscle or joint pain, stiffness, or dystonia.  Individual Medical  History/ Review of Systems: Changes? :Yes     Had pneumonia, antibx, inhaler, cough meds.   Past medications for mental health diagnoses include: Prozac, Luvox made her 'pass out,' Elavil, Ativan, Xanax, Zoloft 'revved her up,' Seroquel, Lithium for 1 pill, Buspar, Inderal, Cymbalta, NAC, Turmeric  Allergies: Metoclopramide, Penicillins, Erenumab-aooe, Fluvoxamine, Penicillin g benzathine, Sumatriptan, and Zoloft [sertraline hcl]  Current Medications:  Current Outpatient Medications:    Cholecalciferol (VITAMIN D) 2000 units CAPS, Take 2,000 Units by mouth daily., Disp: , Rfl:    clonazePAM (KLONOPIN) 0.5 MG tablet, Take 1 tablet (0.5 mg total) by mouth 2 (two) times daily as needed for anxiety., Disp: 60 tablet, Rfl: 1   esomeprazole (NEXIUM) 40 MG capsule, TAKE 1 CAPSULE (40 MG TOTAL) BY MOUTH 2 (TWO) TIMES DAILY BEFORE A MEAL., Disp: 180 capsule, Rfl: 1   FLUoxetine (PROZAC) 40 MG capsule, TAKE 1 CAPSULE (40 MG TOTAL) BY MOUTH DAILY., Disp: 90 capsule, Rfl: 0   melatonin 5 MG TABS, Take 5 mg by mouth at bedtime., Disp: , Rfl:    mirtazapine (REMERON) 7.5 MG tablet, Take 1 tablet (7.5 mg total) by mouth at bedtime as needed. (Patient taking differently: Take 7.5 mg by mouth at bedtime as needed (sleep).), Disp: 90 tablet, Rfl: 1   ondansetron (ZOFRAN ODT) 8 MG disintegrating tablet, Take 1 tablet (8 mg total) by mouth every 8 (eight) hours as needed for nausea or vomiting., Disp: 30 tablet, Rfl: 0   Probiotic Product (PROBIOTIC PO), Take 1 capsule by mouth daily., Disp: , Rfl:    albuterol (VENTOLIN HFA) 108 (90 Base) MCG/ACT inhaler, Inhale 1-2 puffs into the lungs every 4 (four) hours  as needed for wheezing or shortness of breath., Disp: , Rfl:    nitroGLYCERIN (NITROSTAT) 0.4 MG SL tablet, Place 1 tablet (0.4 mg total) under the tongue every 5 (five) minutes as needed for chest pain., Disp: 90 tablet, Rfl: 3   ondansetron (ZOFRAN) 8 MG tablet, Take 1 tablet (8 mg total) by mouth every 8  (eight) hours as needed for nausea., Disp: 4 tablet, Rfl: 0   promethazine-dextromethorphan (PROMETHAZINE-DM) 6.25-15 MG/5ML syrup, Take 5 mLs by mouth every 4 (four) hours as needed for cough. (Patient not taking: Reported on 01/29/2022), Disp: , Rfl:    propranolol (INDERAL) 20 MG tablet, Take 1 tablet (20 mg total) by mouth every 8 (eight) hours. (Patient taking differently: Take 20 mg by mouth 2 (two) times daily as needed (heart rate).), Disp: 180 tablet, Rfl: 5 Medication Side Effects: none  Family Medical/ Social History: Changes?   MENTAL HEALTH EXAM:  Last menstrual period 01/07/2022.There is no height or weight on file to calculate BMI.  General Appearance: Casual and Well Groomed  Eye Contact:  Good  Speech:  Clear and Coherent and Normal Rate  Volume:  Normal  Mood:  Anxious and Depressed  Affect:  Depressed, Tearful, and Anxious  Thought Process:  Goal Directed and Descriptions of Associations: Circumstantial  Orientation:  Full (Time, Place, and Person)  Thought Content: Logical   Suicidal Thoughts:  No  Homicidal Thoughts:  No  Memory:  WNL  Judgement:  Good  Insight:  Good  Psychomotor Activity:  Normal  Concentration:  Concentration: Good  Recall:  Good  Fund of Knowledge: Good  Language: Good  Assets:  Desire for Improvement  ADL's:  Intact  Cognition: WNL  Prognosis:  Good   Labs 01/30/2022    09/30/2021 Labs Hemoglobin A1c 6.1 Lipid panel total cholesterol 167, triglycerides 165, HDL 45, LDL 89 Vitamin D 33.9 TSH 1.5   DIAGNOSES:    ICD-10-CM   1. Anticipatory grief  F43.20     2. Generalized anxiety disorder  F41.1     3. Panic disorder  F41.0        Receiving Psychotherapy: No   Rinaldo Cloud, LCSW in the past.   RECOMMENDATIONS:  PDMP was reviewed.  Last Klonopin filled 12/31/2021.   Discussed low Vitamin D. Explained that in psychiatry we would like to have the 25 hydroxy vitamin D level at least 50 and preferably 60 or 70.   FMLA ok up  to 6 weeks   Discontinue Prozac. Start Wellbutrin SR 100 mg, 1 p.o. every morning for 1 week and then increase to 1 p.o. every morning and 1 p.o. at supper. Continue Klonopin 0.5 mg, 1 p.o. twice daily as needed.  Continue mirtazapine 7.5 mg, 1 p.o. nightly as needed per PCP. Start vitamin D 50,000 IUs weekly. Restart therapy with Rinaldo Cloud, LCSW. Return in 4 weeks.  Donnal Moat, PA-C

## 2022-02-06 ENCOUNTER — Ambulatory Visit: Payer: 59 | Admitting: Family Medicine

## 2022-02-06 ENCOUNTER — Telehealth: Payer: Self-pay | Admitting: Physician Assistant

## 2022-02-06 NOTE — Progress Notes (Deleted)
ACUTE VISIT No chief complaint on file.  HPI: [name] is a 36 y.o. female, who is here today complaining of *** HPI  Review of Systems Rest see pertinent positives and negatives per HPI.  Current Outpatient Medications on File Prior to Visit  Medication Sig Dispense Refill  . albuterol (VENTOLIN HFA) 108 (90 Base) MCG/ACT inhaler Inhale 1-2 puffs into the lungs every 4 (four) hours as needed for wheezing or shortness of breath.    Marland Kitchen buPROPion ER (WELLBUTRIN SR) 100 MG 12 hr tablet 1 po q am for 1 week, then 1 p.o. every morning and 1 p.o. with supper 60 tablet 1  . Cholecalciferol (VITAMIN D) 2000 units CAPS Take 2,000 Units by mouth daily.    . clonazePAM (KLONOPIN) 0.5 MG tablet Take 1 tablet (0.5 mg total) by mouth 2 (two) times daily as needed for anxiety. 60 tablet 1  . esomeprazole (NEXIUM) 40 MG capsule TAKE 1 CAPSULE (40 MG TOTAL) BY MOUTH 2 (TWO) TIMES DAILY BEFORE A MEAL. 180 capsule 1  . FLUoxetine (PROZAC) 40 MG capsule TAKE 1 CAPSULE (40 MG TOTAL) BY MOUTH DAILY. 90 capsule 0  . melatonin 5 MG TABS Take 5 mg by mouth at bedtime.    . mirtazapine (REMERON) 7.5 MG tablet Take 1 tablet (7.5 mg total) by mouth at bedtime as needed. (Patient taking differently: Take 7.5 mg by mouth at bedtime as needed (sleep).) 90 tablet 1  . nitroGLYCERIN (NITROSTAT) 0.4 MG SL tablet Place 1 tablet (0.4 mg total) under the tongue every 5 (five) minutes as needed for chest pain. 90 tablet 3  . ondansetron (ZOFRAN ODT) 8 MG disintegrating tablet Take 1 tablet (8 mg total) by mouth every 8 (eight) hours as needed for nausea or vomiting. 30 tablet 0  . ondansetron (ZOFRAN) 8 MG tablet Take 1 tablet (8 mg total) by mouth every 8 (eight) hours as needed for nausea. 4 tablet 0  . Probiotic Product (PROBIOTIC PO) Take 1 capsule by mouth daily.    . promethazine-dextromethorphan (PROMETHAZINE-DM) 6.25-15 MG/5ML syrup Take 5 mLs by mouth every 4 (four) hours as needed for cough. (Patient not  taking: Reported on 01/29/2022)    . propranolol (INDERAL) 20 MG tablet Take 1 tablet (20 mg total) by mouth every 8 (eight) hours. (Patient taking differently: Take 20 mg by mouth 2 (two) times daily as needed (heart rate).) 180 tablet 5  . Vitamin D, Ergocalciferol, (DRISDOL) 1.25 MG (50000 UNIT) CAPS capsule Take 1 capsule (50,000 Units total) by mouth every 7 (seven) days. 12 capsule 1  . [DISCONTINUED] DULoxetine (CYMBALTA) 20 MG capsule Take 1 capsule (20 mg total) by mouth daily. (Patient not taking: Reported on 03/20/2019) 30 capsule 1   No current facility-administered medications on file prior to visit.     Past Medical History:  Diagnosis Date  . Abdominal pain, epigastric 02/18/2017  . Allergy   . Anterior neck pain 12/30/2015  . Anxiety   . Anxiety associated with birthing process 06/14/2018  . B12 deficiency 07/21/2016  . Back pain   . Chest pain   . Chronic mixed headache syndrome 06/14/2018  . COVID-19 virus infection 03/2020   with infusion   . Depression   . DUB (dysfunctional uterine bleeding) 06/04/2011  . Dysphagia 07/20/2018  . Enlarged thyroid gland 12/30/2015  . Fatigue 12/30/2015  . Gallstones   . Generalized anxiety disorder 06/09/2016  . GERD (gastroesophageal reflux disease) 02/19/2011  . Headache, unspecified headache type 03/05/2018  .  History of nephrolithiasis   . Kidney stone   . Medication overuse headache 06/14/2018  . Menorrhagia 11/09/2018  . Migraine   . Missed abortion 11/09/2018  . Nausea vomiting and diarrhea 12/28/2013  . Nausea without vomiting 02/18/2017  . NEPHROLITHIASIS, HX OF 12/16/2007   Qualifier: Diagnosis of  By: Sherren Mocha MD, Jory Ee   . Other fatigue   . Palpitations 03/21/2018  . Pituitary tumor   . 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 Precordial pain 03/21/2018  . Prediabetes   . PVC's (premature ventricular contractions)   . Routine general medical examination at a health care facility 09/27/2014  . Serum calcium elevated  12/28/2013  . SOB (shortness of breath) on exertion   . SVD (spontaneous vaginal delivery) 02/02/2018  . Tendinitis of right ankle 08/14/2014  . 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 Vitamin D deficiency    Allergies  Allergen Reactions  . Metoclopramide Palpitations and Other (See Comments)    Rapid heart rate  . Penicillins Hives, Rash and Other (See Comments)    Childhood reaction Has patient had a PCN reaction causing immediate rash, facial/tongue/throat swelling, SOB or lightheadedness with hypotension: No Has patient had a PCN reaction causing severe rash involving mucus membranes or skin necrosis: No Has patient had a PCN reaction that required hospitalization No Has patient had a PCN reaction occurring within the last 10 years: No If all of the above answers are "NO", then may proceed with Cephalosporin use.  Eduard Roux Other (See Comments)    Other reaction(s): Myalgias (intolerance) Constipation, hair loss  . Fluvoxamine Other (See Comments)    Other reaction(s): Fainted  . Penicillin G Benzathine Other (See Comments)  . Sumatriptan Palpitations  . Zoloft [Sertraline Hcl] Anxiety    Social History   Socioeconomic History  . Marital status: Married    Spouse name: Jonathon  . Number of children: 1  . Years of education: Not on file  . Highest education level: Bachelor's degree (e.g., BA, AB, BS)  Occupational History  . Occupation: customer care representative  Tobacco Use  . Smoking status: Never  . Smokeless tobacco: Never  Vaping Use  . Vaping Use: Never used  Substance and Sexual Activity  . Alcohol use: Not Currently  . Drug use: No  . Sexual activity: Yes  Other Topics Concern  . Not on file  Social History Narrative   Patient is right-handed. She lives with her husband and child in a split-level home.    Daughter born 01/2018   She avoids caffeine.    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 There is no height or weight on file to calculate BMI.  Physical Exam  ASSESSMENT AND PLAN:  There are no diagnoses linked to this encounter.   No follow-ups on file.   Betty G. Martinique, MD  [facility]. [place] office.  Discharge Instructions   None

## 2022-02-06 NOTE — Telephone Encounter (Signed)
Received fax from [employer] Pt employer requesting FMLA paperwok. Put in Traci's box. Due by 02/20/22.

## 2022-02-08 ENCOUNTER — Telehealth: Payer: Self-pay

## 2022-02-08 NOTE — Telephone Encounter (Signed)
Noted  

## 2022-02-08 NOTE — Telephone Encounter (Signed)
Completed FMLA paper work from [employer] will let Helene Kelp review and sign on Monday 6/12

## 2022-02-09 ENCOUNTER — Telehealth: Payer: Self-pay | Admitting: Cardiology

## 2022-02-09 DIAGNOSIS — Z0289 Encounter for other administrative examinations: Secondary | ICD-10-CM

## 2022-02-09 NOTE — Telephone Encounter (Signed)
Patient would like to transfer over from Dr. Harriet Masson to Dr. Rayann Heman for second opinion.

## 2022-02-10 NOTE — Progress Notes (Deleted)
ACUTE VISIT No chief complaint on file.  HPI: [name] is a 36 y.o. female, who is here today complaining of *** HPI  Review of Systems Rest see pertinent positives and negatives per HPI.  Current Outpatient Medications on File Prior to Visit  Medication Sig Dispense Refill  . albuterol (VENTOLIN HFA) 108 (90 Base) MCG/ACT inhaler Inhale 1-2 puffs into the lungs every 4 (four) hours as needed for wheezing or shortness of breath.    Marland Kitchen buPROPion ER (WELLBUTRIN SR) 100 MG 12 hr tablet 1 po q am for 1 week, then 1 p.o. every morning and 1 p.o. with supper 60 tablet 1  . Cholecalciferol (VITAMIN D) 2000 units CAPS Take 2,000 Units by mouth daily.    . clonazePAM (KLONOPIN) 0.5 MG tablet Take 1 tablet (0.5 mg total) by mouth 2 (two) times daily as needed for anxiety. 60 tablet 1  . esomeprazole (NEXIUM) 40 MG capsule TAKE 1 CAPSULE (40 MG TOTAL) BY MOUTH 2 (TWO) TIMES DAILY BEFORE A MEAL. 180 capsule 1  . melatonin 5 MG TABS Take 5 mg by mouth at bedtime.    . mirtazapine (REMERON) 7.5 MG tablet Take 1 tablet (7.5 mg total) by mouth at bedtime as needed. (Patient taking differently: Take 7.5 mg by mouth at bedtime as needed (sleep).) 90 tablet 1  . nitroGLYCERIN (NITROSTAT) 0.4 MG SL tablet Place 1 tablet (0.4 mg total) under the tongue every 5 (five) minutes as needed for chest pain. 90 tablet 3  . ondansetron (ZOFRAN ODT) 8 MG disintegrating tablet Take 1 tablet (8 mg total) by mouth every 8 (eight) hours as needed for nausea or vomiting. 30 tablet 0  . ondansetron (ZOFRAN) 8 MG tablet Take 1 tablet (8 mg total) by mouth every 8 (eight) hours as needed for nausea. 4 tablet 0  . Probiotic Product (PROBIOTIC PO) Take 1 capsule by mouth daily.    . promethazine-dextromethorphan (PROMETHAZINE-DM) 6.25-15 MG/5ML syrup Take 5 mLs by mouth every 4 (four) hours as needed for cough. (Patient not taking: Reported on 01/29/2022)    . propranolol (INDERAL) 20 MG tablet Take 1 tablet (20 mg  total) by mouth every 8 (eight) hours. (Patient taking differently: Take 20 mg by mouth 2 (two) times daily as needed (heart rate).) 180 tablet 5  . Vitamin D, Ergocalciferol, (DRISDOL) 1.25 MG (50000 UNIT) CAPS capsule Take 1 capsule (50,000 Units total) by mouth every 7 (seven) days. 12 capsule 1  . [DISCONTINUED] DULoxetine (CYMBALTA) 20 MG capsule Take 1 capsule (20 mg total) by mouth daily. (Patient not taking: Reported on 03/20/2019) 30 capsule 1   No current facility-administered medications on file prior to visit.     Past Medical History:  Diagnosis Date  . Abdominal pain, epigastric 02/18/2017  . Allergy   . Anterior neck pain 12/30/2015  . Anxiety   . Anxiety associated with birthing process 06/14/2018  . B12 deficiency 07/21/2016  . Back pain   . Chest pain   . Chronic mixed headache syndrome 06/14/2018  . COVID-19 virus infection 03/2020   with infusion   . Depression   . DUB (dysfunctional uterine bleeding) 06/04/2011  . Dysphagia 07/20/2018  . Enlarged thyroid gland 12/30/2015  . Fatigue 12/30/2015  . Gallstones   . Generalized anxiety disorder 06/09/2016  . GERD (gastroesophageal reflux disease) 02/19/2011  . Headache, unspecified headache type 03/05/2018  . History of nephrolithiasis   . Kidney stone   . Medication overuse headache 06/14/2018  . Menorrhagia  11/09/2018  . Migraine   . Missed abortion 11/09/2018  . Nausea vomiting and diarrhea 12/28/2013  . Nausea without vomiting 02/18/2017  . NEPHROLITHIASIS, HX OF 12/16/2007   Qualifier: Diagnosis of  By: Sherren Mocha MD, Jory Ee   . Other fatigue   . Palpitations 03/21/2018  . Pituitary tumor   . 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 Precordial pain 03/21/2018  . Prediabetes   . PVC's (premature ventricular contractions)   . Routine general medical examination at a health care facility 09/27/2014  . Serum calcium elevated 12/28/2013  . SOB (shortness of breath) on exertion   . SVD (spontaneous vaginal delivery)  02/02/2018  . Tendinitis of right ankle 08/14/2014  . 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 Vitamin D deficiency    Allergies  Allergen Reactions  . Metoclopramide Palpitations and Other (See Comments)    Rapid heart rate  . Penicillins Hives, Rash and Other (See Comments)    Childhood reaction Has patient had a PCN reaction causing immediate rash, facial/tongue/throat swelling, SOB or lightheadedness with hypotension: No Has patient had a PCN reaction causing severe rash involving mucus membranes or skin necrosis: No Has patient had a PCN reaction that required hospitalization No Has patient had a PCN reaction occurring within the last 10 years: No If all of the above answers are "NO", then may proceed with Cephalosporin use.  Eduard Roux Other (See Comments)    Other reaction(s): Myalgias (intolerance) Constipation, hair loss  . Fluvoxamine Other (See Comments)    Other reaction(s): Fainted  . Penicillin G Benzathine Other (See Comments)  . Sumatriptan Palpitations  . Zoloft [Sertraline Hcl] Anxiety    Social History   Socioeconomic History  . Marital status: Married    Spouse name: Brandy [name]  . Number of children: 1  . Years of education: Not on file  . Highest education level: Bachelor's degree (e.g., BA, AB, BS)  Occupational History  . Occupation: customer care representative  Tobacco Use  . Smoking status: Never  . Smokeless tobacco: Never  Vaping Use  . Vaping Use: Never used  Substance and Sexual Activity  . Alcohol use: Not Currently  . Drug use: No  . Sexual activity: Yes  Other Topics Concern  . Not on file  Social History Narrative   Patient is right-handed. She lives with her husband and child in a split-level home.    Daughter born 01/2018   She avoids caffeine.    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 There is no height or weight on file to calculate BMI.  Physical Exam  ASSESSMENT AND PLAN:  There are no diagnoses linked to this encounter.   No follow-ups on file.   Betty G. Martinique, MD  [facility]. [place] office.  Discharge Instructions   None

## 2022-02-11 ENCOUNTER — Ambulatory Visit: Payer: 59 | Admitting: Family Medicine

## 2022-02-13 MED ORDER — BUPROPION HCL ER (SR) 100 MG PO TB12
ORAL_TABLET | ORAL | 1 refills | Status: DC
Start: 2022-02-13 — End: 2022-03-02

## 2022-02-13 NOTE — Addendum Note (Signed)
Addended by: Bonney Leitz T on: 02/13/2022 04:08 PM   Modules accepted: Orders

## 2022-02-13 NOTE — Telephone Encounter (Signed)
Patient would like to switch provider from Dr. Harriet Masson to Dr. Stanford Breed for a 2nd opinion.

## 2022-02-18 ENCOUNTER — Ambulatory Visit: Payer: 59

## 2022-02-18 ENCOUNTER — Ambulatory Visit (INDEPENDENT_AMBULATORY_CARE_PROVIDER_SITE_OTHER): Payer: 59 | Admitting: Psychiatry

## 2022-02-18 ENCOUNTER — Ambulatory Visit: Payer: 59 | Admitting: Family Medicine

## 2022-02-18 DIAGNOSIS — F411 Generalized anxiety disorder: Secondary | ICD-10-CM

## 2022-02-18 NOTE — Progress Notes (Signed)
[facility] Counsel[facility] Initial Adult Exam  Name: Brandy [name] Date: 02/18/2022 MRN: [id number] DOB: 08-30-1986 PCP: Martinique, Betty G, MD  Time spent: 60 minutes   Guardian/Payee:  patient     Paperwork requested:  No   Reason for Visit /Presenting Problem: anxiety, depression, sadness, Lost a neighbor and a close cousin and a friend within past 6 weeks. Mom being evaluated for Parkinson's and she is not doing well at all.  Mental Status Exam:    Appearance:   Casual     Behavior:  Appropriate, Sharing, and Motivated  Motor:  Normal  Speech/Language:   Clear and Coherent  Affect:  Depressed and anxious  Mood:  anxious and depressed  Thought process:  goal directed  Thought content:    Obsessions and obsessiveness and overthinking  Sensory/Perceptual disturbances:    WNL  Orientation:  oriented to person, place, time/date, situation, day of week, month of year, year, and stated date of February 18, 2022  Attention:  Fair  Concentration:  Fair  Memory:  Some short term issues when stressed  Fund of knowledge:   Good  Insight:    Good and Fair  Judgment:   Good  Impulse Control:  Good   Reported Symptoms:  see symptoms above  Risk Assessment: Danger to Self:  No Self-injurious Behavior: No Danger to Others: No Duty to Warn:no Physical Aggression / Violence:No  Access to Firearms a concern: No  Gang Involvement:No  Patient / guardian was educated about steps to take if suicide or homicide risk level increases between visits: Denies any SI. While future psychiatric events cannot be accurately predicted, the patient does not currently require acute inpatient psychiatric care and does not currently meet [facility] involuntary commitment criteria.  Substance Abuse History: Current substance abuse: No     Past Psychiatric History:   Previous psychological history is significant for anxiety and seen briefly right after having her child 4 yrs ago Outpatient  Providers:n/a History of Psych Hospitalization:  briefly after having first child; "turned out symptoms were medicine related" Psychological Testing:  n/a    Abuse History: Victim of No.,  n/a    Report needed: No. Victim of Neglect:No. Perpetrator of  n/a   Witness / Exposure to Domestic Violence: No   Protective Services Involvement: No  Witness to [employer] Violence:  No   Family History: Reviewed with patient and she confirm info below Family History  Problem Relation Age of Onset   Diabetes Mother    Mental illness Mother    Asthma Mother    Stomach cancer Mother    Colon polyps Mother    Heart disease Mother    Hypertension Mother    Arthritis Mother    Thyroid disease Mother    Cancer Mother    Anxiety disorder Mother    Depression Mother    Obesity Mother    Diabetes Father    Hypertension Father    Hyperlipidemia Father    Cancer Father    Sleep apnea Father    Anxiety disorder Sister    Colon cancer Maternal Grandmother    Diabetes Paternal Grandmother    Breast cancer Maternal Aunt    Lung cancer Maternal Aunt    Diabetes Paternal Aunt    Diabetes Maternal Uncle    Diabetes Maternal Uncle    Esophageal cancer Neg Hx    Rectal cancer Neg Hx     Living situation: the patient lives with their spouse Roderic Palau) and 18 yr old daughter  Adalyn. Parent are together and live close to patient and her family. Has 1 "sister who is really half-sister (age 54) and we are very close". Grandparent are deceased.   Sexual Orientation:  Straight  Relationship Status: married 4.5 yrs Name of spouse / other:n/a             If a parent, number of children / ages:4 yr old girl  Support Systems; spouse Friends - "a few really close with" Parents   Financial Stress:  Yes  Husband working for [employer].  Patient on leave from work currently.  Income/Employment/Disability: Employment  Armed forces logistics/support/administrative officer: No   Educational History: Education: Multimedia programmer:   Pro[employer]ant  Any cultural differences that may affect / interfere with treatment:  not applicable   Recreation/Hobbies: golf, painting, read Bible  Stressors:Financial difficulties   Health problems   Loss of 3 close people in past 6wks    Strengths:  Supportive Relationships, Family, Friends, Social worker, Spirituality, Hopefulness, Conservator, museum/gallery, and Able to Communicate Effectively  Barriers:  "my anxiety, my health"  Legal History: Pending legal issue / charges: The patient has no significant history of legal issues. History of legal issue / charges:  n/a  Medical History/Surgical History:  Reviewed with patient and she confirms info below Past Medical History:  Diagnosis Date   Abdominal pain, epigastric 02/18/2017   Allergy    Anterior neck pain 12/30/2015   Anxiety    Anxiety associated with birthing process 06/14/2018   B12 deficiency 07/21/2016   Back pain    Chest pain    Chronic mixed headache syndrome 06/14/2018   COVID-19 virus infection 03/2020   with infusion    Depression    DUB (dysfunctional uterine bleeding) 06/04/2011   Dysphagia 07/20/2018   Enlarged thyroid gland 12/30/2015   Fatigue 12/30/2015   Gallstones    Generalized anxiety disorder 06/09/2016   GERD (gastroesophageal reflux disease) 02/19/2011   Headache, unspecified headache type 03/05/2018   History of nephrolithiasis    Kidney stone    Medication overuse headache 06/14/2018   Menorrhagia 11/09/2018   Migraine    Missed abortion 11/09/2018   Nausea vomiting and diarrhea 12/28/2013   Nausea without vomiting 02/18/2017   NEPHROLITHIASIS, HX OF 12/16/2007   Qualifier: Diagnosis of  By: Sherren Mocha MD, Jory Ee    Other fatigue    Palpitations 03/21/2018   Pituitary tumor    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   Precordial pain 03/21/2018   Prediabetes    PVC's (premature ventricular contractions)    Routine general medical examination at a health care facility  09/27/2014   Serum calcium elevated 12/28/2013   SOB (shortness of breath) on exertion    SVD (spontaneous vaginal delivery) 02/02/2018   Tendinitis of right ankle 08/14/2014   TMJ syndrome 05/30/2012   Vitamin D deficiency     Past Surgical History:  Procedure Laterality Date   CHOLECYSTECTOMY  2014   KIDNEY STONE SURGERY     x 5   TYMPANOSTOMY TUBE PLACEMENT     WISDOM TOOTH EXTRACTION      Medications: Current Outpatient Medications  Medication Sig Dispense Refill   albuterol (VENTOLIN HFA) 108 (90 Base) MCG/ACT inhaler Inhale 1-2 puffs into the lungs every 4 (four) hours as needed for wheezing or shortness of breath.     buPROPion ER (WELLBUTRIN SR) 100 MG 12 hr tablet 1 BY MOUTH IN THE MORNING FOR 1 WEEK, THEN 1 BY MOUTH EVERY MORNING AND 1 BY MOUTH  WITH SUPPER 60 tablet 1   Cholecalciferol (VITAMIN D) 2000 units CAPS Take 2,000 Units by mouth daily.     clonazePAM (KLONOPIN) 0.5 MG tablet Take 1 tablet (0.5 mg total) by mouth 2 (two) times daily as needed for anxiety. 60 tablet 1   esomeprazole (NEXIUM) 40 MG capsule TAKE 1 CAPSULE (40 MG TOTAL) BY MOUTH 2 (TWO) TIMES DAILY BEFORE A MEAL. 180 capsule 1   melatonin 5 MG TABS Take 5 mg by mouth at bedtime.     mirtazapine (REMERON) 7.5 MG tablet Take 1 tablet (7.5 mg total) by mouth at bedtime as needed. (Patient taking differently: Take 7.5 mg by mouth at bedtime as needed (sleep).) 90 tablet 1   nitroGLYCERIN (NITROSTAT) 0.4 MG SL tablet Place 1 tablet (0.4 mg total) under the tongue every 5 (five) minutes as needed for chest pain. 90 tablet 3   ondansetron (ZOFRAN ODT) 8 MG disintegrating tablet Take 1 tablet (8 mg total) by mouth every 8 (eight) hours as needed for nausea or vomiting. 30 tablet 0   ondansetron (ZOFRAN) 8 MG tablet Take 1 tablet (8 mg total) by mouth every 8 (eight) hours as needed for nausea. 4 tablet 0   Probiotic Product (PROBIOTIC PO) Take 1 capsule by mouth daily.     promethazine-dextromethorphan  (PROMETHAZINE-DM) 6.25-15 MG/5ML syrup Take 5 mLs by mouth every 4 (four) hours as needed for cough. (Patient not taking: Reported on 01/29/2022)     propranolol (INDERAL) 20 MG tablet Take 1 tablet (20 mg total) by mouth every 8 (eight) hours. (Patient taking differently: Take 20 mg by mouth 2 (two) times daily as needed (heart rate).) 180 tablet 5   Vitamin D, Ergocalciferol, (DRISDOL) 1.25 MG (50000 UNIT) CAPS capsule Take 1 capsule (50,000 Units total) by mouth every 7 (seven) days. 12 capsule 1   No current facility-administered medications for this visit.    Allergies  Allergen Reactions   Metoclopramide Palpitations and Other (See Comments)    Rapid heart rate   Penicillins Hives, Rash and Other (See Comments)    Childhood reaction Has patient had a PCN reaction causing immediate rash, facial/tongue/throat swelling, SOB or lightheadedness with hypotension: No Has patient had a PCN reaction causing severe rash involving mucus membranes or skin necrosis: No Has patient had a PCN reaction that required hospitalization No Has patient had a PCN reaction occurring within the last 10 years: No If all of the above answers are "NO", then may proceed with Cephalosporin use.   Erenumab-Aooe Other (See Comments)    Other reaction(s): Myalgias (intolerance) Constipation, hair loss   Fluvoxamine Other (See Comments)    Other reaction(s): Fainted   Penicillin G Benzathine Other (See Comments)   Sumatriptan Palpitations   Zoloft [Sertraline Hcl] Anxiety    Diagnoses:  No diagnosis found.   Treatment goal plan: Patient not signing treatment goal plan due to Covid.  Treatment goals: Treatment goals remain on treatment plan as patient works with strategies to achieve her goals.  Progress is assessed each session and documented in "subjective" and/or "plan" sections of treatment note. Long-term goal: Reduce overall frequency, level, and intensity of the anxiety so that daily functioning is not  impaired.  Short-term goal: Verbalize an understanding of the role that fearful thinking plays in creating fears, excessive worry, and persistent anxiety symptoms. Strategies: Identify and use specific coping strategies for anxiety reduction.    Plan of Care: This is patient's first appointment with this therapist and today we collaboratively  completed her initial evaluation for therapy and also her initial treatment goal plan.  Lilleigh Hechavarria is a 36 year old married female. Has been written out of work currently by med provider Donnal Moat (PA-C), and patient's job is being held for her. Been married almost 5 yrs and they have 1 child, a 66 yr old daughter. Taking Wellbutrin and Klonopin currently which helps her symptoms of anxiety, depression "some" but still sometimes has panic attacks, low energy, feeling like I can't think at time, and increased anxiety.  Seems most concerned about her anxiety, depression, sadness, and the loss of 3 people due to death within the past 6 weeks (including a neighbor, a close cousin, and a good friend).  Did express some tearfulness regarding these losses today and will be following up on this in her sessions.  As far as mental status exam, is alert and oriented and her appearance is casual, behavior is appropriate and motivated, motor skills are normal, speech/language is clear and coherent, affect and mood are anxious and depressed, thought process is goal directed, thought content includes obsessiveness and overthinking, sensory/perceptual is WNL, well oriented to person/place/time/date/situation/day of week/month of year/year and stated date of February 18, 2022.  Her attention and concentration she rates as being fair, memory includes some short-term memory issues when overly stressed, insight is sometimes good and sometimes fair, judgment and impulse control she rates as both being good.  Patient denies any thoughts to harm herself or anyone else.  She also reports no  substance abuse history.  Patient was seen on an outpatient basis due to anxiety 4 years ago after having her child.  Had a brief psychiatric admission to the hospital after having her child "but it turned out to be that my symptoms were from a medicine I was given and was released from the hospital next day".  Patient states some of her anxiety revolves around health concerns for herself and others within the family and that when the 3 deaths happened recently it exacerbated this anxiety.  There is significant family health issues over the years including mental illness, diabetes hypertension, anxiety, depression, and cancer amongst family members.  Patient and her husband Roderic Palau, have a 80-year-old daughter (Adalyn), and patient reports that their marriage is "pretty good but husband does get frustrated with her anxiety".  Both the patient's parents are still living and she has 1 sister who is really a "half sister" age 29 and patient states that they are very close.  Patient's parents live in close proximity to where patient and her husband live.  Patient reports that she is active in her church and that helps her feel supported.  Husband works full-time for [employer] and patient is currently on leave from that same company for 6 weeks due to her behavioral health issues currently.  She reports there are some current financial stressors in the home without her working but they are "okay at this point".  She feels that her strengths are supportive relationships, family, friends, church, spirituality, a sense of hopefulness, she is a good self advocate.  She identifies "my anxiety and my health" as her main barrier to treatment.  Does express significant motivation for treatment.  For further information on patient, more information including personal history, risk assessment, medical history can all be found in the sections above that are a part of this full initial evaluation for patient.  Review of initial  treatment plan and patient is in agreement.  Next appointment within  2 weeks.  This record has been created using [employer].  Chart creation errors have been sought, but may not always have been located and corrected.  Such creation errors do not reflect on the standard of medical care provided.   Shanon Ace, LCSW

## 2022-02-18 NOTE — Progress Notes (Unsigned)
ACUTE VISIT No chief complaint on file.  HPI: [name] is a 36 y.o. female, who is here today complaining of *** HPI  Review of Systems Rest see pertinent positives and negatives per HPI.  Current Outpatient Medications on File Prior to Visit  Medication Sig Dispense Refill  . albuterol (VENTOLIN HFA) 108 (90 Base) MCG/ACT inhaler Inhale 1-2 puffs into the lungs every 4 (four) hours as needed for wheezing or shortness of breath.    Marland Kitchen buPROPion ER (WELLBUTRIN SR) 100 MG 12 hr tablet 1 BY MOUTH IN THE MORNING FOR 1 WEEK, THEN 1 BY MOUTH EVERY MORNING AND 1 BY MOUTH WITH SUPPER 60 tablet 1  . Cholecalciferol (VITAMIN D) 2000 units CAPS Take 2,000 Units by mouth daily.    . clonazePAM (KLONOPIN) 0.5 MG tablet Take 1 tablet (0.5 mg total) by mouth 2 (two) times daily as needed for anxiety. 60 tablet 1  . esomeprazole (NEXIUM) 40 MG capsule TAKE 1 CAPSULE (40 MG TOTAL) BY MOUTH 2 (TWO) TIMES DAILY BEFORE A MEAL. 180 capsule 1  . melatonin 5 MG TABS Take 5 mg by mouth at bedtime.    . mirtazapine (REMERON) 7.5 MG tablet Take 1 tablet (7.5 mg total) by mouth at bedtime as needed. (Patient taking differently: Take 7.5 mg by mouth at bedtime as needed (sleep).) 90 tablet 1  . nitroGLYCERIN (NITROSTAT) 0.4 MG SL tablet Place 1 tablet (0.4 mg total) under the tongue every 5 (five) minutes as needed for chest pain. 90 tablet 3  . ondansetron (ZOFRAN ODT) 8 MG disintegrating tablet Take 1 tablet (8 mg total) by mouth every 8 (eight) hours as needed for nausea or vomiting. 30 tablet 0  . ondansetron (ZOFRAN) 8 MG tablet Take 1 tablet (8 mg total) by mouth every 8 (eight) hours as needed for nausea. 4 tablet 0  . Probiotic Product (PROBIOTIC PO) Take 1 capsule by mouth daily.    . promethazine-dextromethorphan (PROMETHAZINE-DM) 6.25-15 MG/5ML syrup Take 5 mLs by mouth every 4 (four) hours as needed for cough. (Patient not taking: Reported on 01/29/2022)    . propranolol (INDERAL) 20 MG tablet  Take 1 tablet (20 mg total) by mouth every 8 (eight) hours. (Patient taking differently: Take 20 mg by mouth 2 (two) times daily as needed (heart rate).) 180 tablet 5  . Vitamin D, Ergocalciferol, (DRISDOL) 1.25 MG (50000 UNIT) CAPS capsule Take 1 capsule (50,000 Units total) by mouth every 7 (seven) days. 12 capsule 1  . [DISCONTINUED] DULoxetine (CYMBALTA) 20 MG capsule Take 1 capsule (20 mg total) by mouth daily. (Patient not taking: Reported on 03/20/2019) 30 capsule 1   No current facility-administered medications on file prior to visit.     Past Medical History:  Diagnosis Date  . Abdominal pain, epigastric 02/18/2017  . Allergy   . Anterior neck pain 12/30/2015  . Anxiety   . Anxiety associated with birthing process 06/14/2018  . B12 deficiency 07/21/2016  . Back pain   . Chest pain   . Chronic mixed headache syndrome 06/14/2018  . COVID-19 virus infection 03/2020   with infusion   . Depression   . DUB (dysfunctional uterine bleeding) 06/04/2011  . Dysphagia 07/20/2018  . Enlarged thyroid gland 12/30/2015  . Fatigue 12/30/2015  . Gallstones   . Generalized anxiety disorder 06/09/2016  . GERD (gastroesophageal reflux disease) 02/19/2011  . Headache, unspecified headache type 03/05/2018  . History of nephrolithiasis   . Kidney stone   . Medication overuse headache  06/14/2018  . Menorrhagia 11/09/2018  . Migraine   . Missed abortion 11/09/2018  . Nausea vomiting and diarrhea 12/28/2013  . Nausea without vomiting 02/18/2017  . NEPHROLITHIASIS, HX OF 12/16/2007   Qualifier: Diagnosis of  By: Sherren Mocha MD, Jory Ee   . Other fatigue   . Palpitations 03/21/2018  . Pituitary tumor   . 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 Precordial pain 03/21/2018  . Prediabetes   . PVC's (premature ventricular contractions)   . Routine general medical examination at a health care facility 09/27/2014  . Serum calcium elevated 12/28/2013  . SOB (shortness of breath) on exertion   . SVD (spontaneous  vaginal delivery) 02/02/2018  . Tendinitis of right ankle 08/14/2014  . 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 Vitamin D deficiency    Allergies  Allergen Reactions  . Metoclopramide Palpitations and Other (See Comments)    Rapid heart rate  . Penicillins Hives, Rash and Other (See Comments)    Childhood reaction Has patient had a PCN reaction causing immediate rash, facial/tongue/throat swelling, SOB or lightheadedness with hypotension: No Has patient had a PCN reaction causing severe rash involving mucus membranes or skin necrosis: No Has patient had a PCN reaction that required hospitalization No Has patient had a PCN reaction occurring within the last 10 years: No If all of the above answers are "NO", then may proceed with Cephalosporin use.  Eduard Roux Other (See Comments)    Other reaction(s): Myalgias (intolerance) Constipation, hair loss  . Fluvoxamine Other (See Comments)    Other reaction(s): Fainted  . Penicillin G Benzathine Other (See Comments)  . Sumatriptan Palpitations  . Zoloft [Sertraline Hcl] Anxiety    Social History   Socioeconomic History  . Marital status: Married    Spouse name: Jonathon  . Number of children: 1  . Years of education: Not on file  . Highest education level: Bachelor's degree (e.g., BA, AB, BS)  Occupational History  . Occupation: customer care representative  Tobacco Use  . Smoking status: Never  . Smokeless tobacco: Never  Vaping Use  . Vaping Use: Never used  Substance and Sexual Activity  . Alcohol use: Not Currently  . Drug use: No  . Sexual activity: Yes  Other Topics Concern  . Not on file  Social History Narrative   Patient is right-handed. She lives with her husband and child in a split-level home.    Daughter born 01/2018   She avoids caffeine.    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 There is no height or weight on file to calculate BMI.  Physical Exam  ASSESSMENT AND PLAN:  There are no diagnoses linked to this encounter.   No follow-ups on file.   Betty G. Martinique, MD  [facility]. [place] office.  Discharge Instructions   None

## 2022-02-20 ENCOUNTER — Ambulatory Visit: Payer: 59 | Admitting: Family Medicine

## 2022-02-20 ENCOUNTER — Encounter: Payer: Self-pay | Admitting: Family Medicine

## 2022-02-20 VITALS — BP 120/70 | HR 81 | Resp 16 | Ht 69.0 in | Wt 245.2 lb

## 2022-02-20 DIAGNOSIS — G8929 Other chronic pain: Secondary | ICD-10-CM | POA: Diagnosis not present

## 2022-02-20 DIAGNOSIS — F411 Generalized anxiety disorder: Secondary | ICD-10-CM

## 2022-02-20 DIAGNOSIS — M546 Pain in thoracic spine: Secondary | ICD-10-CM | POA: Diagnosis not present

## 2022-02-20 DIAGNOSIS — R002 Palpitations: Secondary | ICD-10-CM

## 2022-02-20 DIAGNOSIS — Z0289 Encounter for other administrative examinations: Secondary | ICD-10-CM

## 2022-02-24 NOTE — Telephone Encounter (Signed)
Attempted to contact patient to inform her of the updates of the provider switch. Unable to leave voicemail for patient as mailbox is full

## 2022-02-26 ENCOUNTER — Ambulatory Visit: Payer: 59 | Admitting: Family Medicine

## 2022-02-26 ENCOUNTER — Encounter: Payer: Self-pay | Admitting: Family Medicine

## 2022-02-26 ENCOUNTER — Ambulatory Visit (INDEPENDENT_AMBULATORY_CARE_PROVIDER_SITE_OTHER): Payer: 59 | Admitting: Psychiatry

## 2022-02-26 VITALS — BP 94/70 | HR 83 | Ht 69.0 in | Wt 251.2 lb

## 2022-02-26 DIAGNOSIS — G8929 Other chronic pain: Secondary | ICD-10-CM | POA: Diagnosis not present

## 2022-02-26 DIAGNOSIS — M546 Pain in thoracic spine: Secondary | ICD-10-CM

## 2022-02-26 DIAGNOSIS — F411 Generalized anxiety disorder: Secondary | ICD-10-CM | POA: Diagnosis not present

## 2022-02-26 NOTE — Progress Notes (Signed)
[facility] Counselor/Therapist Progress Note  Patient ID: Brandy [name], MRN: [id number],    Date: 02/26/2022  Time Spent: 55 minutes   Treatment Type: Individual Therapy  Reported Symptoms: anxiety, sadness, depression  Mental Status Exam:  Appearance:   Well Groomed     Behavior:  Appropriate, Sharing, and Motivated  Motor:  Normal  Speech/Language:   Clear and Coherent  Affect:  Anxious, some depression, sadness  Mood:  anxious, depressed, and sad  Thought process:  goal directed  Thought content:    overthinks  Sensory/Perceptual disturbances:    WNL  Orientation:  oriented to person, place, time/date, situation, day of week, month of year, year, and stated date of February 26, 2022  Attention:  Fair  Concentration:  Fair  Memory:  WNL  Fund of knowledge:   Good  Insight:    Good  Judgment:   Good  Impulse Control:  Good   Risk Assessment: Danger to Self:  No Self-injurious Behavior: No Danger to Others: No Duty to Warn:no Physical Aggression / Violence:No  Access to Firearms a concern: No  Gang Involvement:No   Subjective:   Patient in today reporting anxiety, sadness, tearfulness, and depression due to personal/family/work stressors and the death of 3 close people within the past 6 weeks.  Patient's mother is being evaluated for Parkinson's disease and is having significant struggles currently. Patient anxious, sad, depressed, but no SI, overwhelmed in trying to help older sister in helping their mom as mom's health declines. Expressing lots of sadness and mixed feelings of several losses recently, her mom's health decline, financial stressors with having to be out of work, and "worrying about mom" and managing her own sadness.  Very stressed and hard to set limits with the various situations and people that she feels responsible to and for.  Encouraged not to jump ahead and assuming worst case scenarios and to focus more on the moment, having some quality time with  her mother to support her and yet also being able to set boundaries, working with her sister to help as they both support their mother and also having breaks for themselves as they both have their own families with young children.  Working on not feeling guilty which is a challenge for patient and we discussed this at length today, along with being able to say no to some situations when she needs to. Still processing some grief regarding the 3 recent losses.  Really having to work on taking some breaks for herself and having some healthy boundaries and yet still working with her sister to help support their mother.  It is fortunate that she has strength and support through good relationships with other family members, friends, and church.  Interventions: Solution-Oriented/Positive Psychology, Ego-Supportive, and Insight-Oriented  Treatment goals: Treatment goals remain on treatment plan as patient works with strategies to achieve her goals.  Progress is assessed each session and documented in "subjective" and/or "plan" sections of treatment note. Long-term goal: Reduce overall frequency, level, and intensity of the anxiety so that daily functioning is not impaired.  Short-term goal: Verbalize an understanding of the role that fearful thinking plays in creating fears, excessive worry, and persistent anxiety symptoms. Strategies: Identify and use specific coping strategies for anxiety reduction.    Diagnosis:   ICD-10-CM   1. Generalized anxiety disorder  F41.1      Plan: Patient in today and is showing good participation and motivation as she worked on her symptoms of anxiety, tearfulness,  feeling overwhelmed at times, depression, and sadness mostly related to her mother's current decline and awaiting a diagnosis regarding possible Parkinson's disease, and the loss of 3 people close to the family recently.  Is showing more strength today but this is a work in progress.  Needing to really focus more  on setting some limits and boundaries for herself so that she can stay healthy as she tries to support her mother and others and take care of her own daughter.  This is a real challenge for patient as is "not assuming the worst" regarding her mother.  Discussed specific examples of healthier boundaries for patient and she is encouraged to follow through and setting some boundaries without guilt, understanding that she will be a better help and resource to her family if she does exercise and boundaries. Encouraged patient in practicing more positive behaviors including: Positive self talk, staying in the present focusing what she can change, getting outside some daily and walking, staying in touch with people who are supportive, looking for more positives than negatives daily, remain on her prescribed medication, healthy nutrition and exercise, trying to not assume negatives and worst case scenarios, use inspirational books and helpful podcast, refrain from self negating, reduce overthinking and over analyzing, challenge and counteract her self doubt, saying no without feeling guilty, interrupting negative/anxious thoughts to challenge and replace them with more reality-based thoughts, and realize the strength she shows working with goal directed behaviors to move in a direction that supports her improved emotional health.  Goal review and progress/challenges noted with patient.  Next appointment within 2 weeks.  This record has been created using [employer].  Chart creation errors have been sought, but may not always have been located and corrected.  Such creation errors do not reflect on the standard of medical care provided.   Shanon Ace, LCSW

## 2022-02-26 NOTE — Progress Notes (Signed)
    Subjective:    CC: Mid-back pain  I, Wendy Poet, LAT, ATC, am serving as scribe for Dr. Lynne Leader.  HPI: Pt is a 36 y/o female presenting w/ c/o mid-back pain x 3-4 years that she noticed after delivering her 13 y/o daughter.  She locates her pain to her midline T-spine just distal to her scapula.  Radiating pain: yes bilaterally into her scapula and shoulders; and into R arm into her fingers R UE paresthesias: no Aggravating factors: constant pain no matter what position; hurts at night when trying to sleep Treatments tried: muscle relaxers, chiropractic care; massage therapy; prior PT years ago; Tylenol  Pertinent review of Systems: No fevers or chills  Relevant historical information: GERD.  Gallstones.   Objective:    Vitals:   02/26/22 1110  BP: 94/70  Pulse: 83  SpO2: 95%   General: Well Developed, well nourished, and in no acute distress.   MSK: T-spine: Nontender midline.  Tender palpation mild mid thoracic paraspinal musculature.  Normal thoracic motion.  Lab and Radiology Results    EXAM: CHEST - 2 VIEW   COMPARISON:  08/28/2021   FINDINGS: Cardiac shadow is at the upper limits of normal in size. No focal infiltrate or effusion is seen. No bony abnormality is noted.   IMPRESSION: No active cardiopulmonary disease.     Electronically Signed   By: Inez Catalina M.D.   On: 01/10/2022 21:31   I, Lynne Leader, personally (independently) visualized and performed the interpretation of the images attached in this note. No significant abnormality seen at the thoracic vertebral bodies on this x-ray.   Impression and Recommendations:    Assessment and Plan: 35 y.o. female with chronic thoracic back pain.  This has been ongoing for 4 years.  She did have a trial of physical therapy about 4 years ago right after the birth of her daughter.  However I do think it be worth a retry physical therapy for this pain.  Plan for trial of physical therapy.  She  lives in the [place] area so [facility] to [facility] in [place].  Consider dry needling as well as prior to therapy.  Check back in 6 weeks.  If not improved would recommend MRI.Marland Kitchen  PDMP not reviewed this encounter. Orders Placed This Encounter  Procedures   Ambulatory referral to Physical Therapy    Referral Priority:   Routine    Referral Type:   Physical Medicine    Referral Reason:   Specialty Services Required    Requested Specialty:   Physical Therapy    Number of Visits Requested:   1   No orders of the defined types were placed in this encounter.   Discussed warning signs or symptoms. Please see discharge instructions. Patient expresses understanding.   The above documentation has been reviewed and is accurate and complete Lynne Leader, M.D.

## 2022-02-26 NOTE — Patient Instructions (Addendum)
Nice to meet you today.  I've referred you to Physical Therapy.  Their office will call you to schedule but please let us know if you don't hear from them in one week regarding scheduling.  Follow-up: 6 weeks

## 2022-02-28 ENCOUNTER — Other Ambulatory Visit: Payer: Self-pay | Admitting: Physician Assistant

## 2022-03-04 ENCOUNTER — Ambulatory Visit (INDEPENDENT_AMBULATORY_CARE_PROVIDER_SITE_OTHER): Payer: Self-pay | Admitting: Psychiatry

## 2022-03-04 DIAGNOSIS — F411 Generalized anxiety disorder: Secondary | ICD-10-CM

## 2022-03-04 NOTE — Progress Notes (Signed)
Patient no showed for appt today

## 2022-03-05 ENCOUNTER — Telehealth (INDEPENDENT_AMBULATORY_CARE_PROVIDER_SITE_OTHER): Payer: 59 | Admitting: Physician Assistant

## 2022-03-05 ENCOUNTER — Encounter: Payer: Self-pay | Admitting: Physician Assistant

## 2022-03-05 DIAGNOSIS — F331 Major depressive disorder, recurrent, moderate: Secondary | ICD-10-CM

## 2022-03-05 DIAGNOSIS — F5105 Insomnia due to other mental disorder: Secondary | ICD-10-CM

## 2022-03-05 DIAGNOSIS — F4321 Adjustment disorder with depressed mood: Secondary | ICD-10-CM | POA: Diagnosis not present

## 2022-03-05 DIAGNOSIS — F99 Mental disorder, not otherwise specified: Secondary | ICD-10-CM

## 2022-03-05 DIAGNOSIS — E559 Vitamin D deficiency, unspecified: Secondary | ICD-10-CM

## 2022-03-05 DIAGNOSIS — F411 Generalized anxiety disorder: Secondary | ICD-10-CM

## 2022-03-05 MED ORDER — TRAZODONE HCL 100 MG PO TABS: 50.0000 mg | ORAL_TABLET | Freq: Every evening | ORAL | 1 refills | Status: DC | PRN

## 2022-03-05 MED ORDER — BUPROPION HCL ER (XL) 150 MG PO TB24: 150.0000 mg | ORAL_TABLET | Freq: Every day | ORAL | 1 refills | Status: DC

## 2022-03-05 MED ORDER — CLONAZEPAM 0.5 MG PO TABS
0.5000 mg | ORAL_TABLET | Freq: Two times a day (BID) | ORAL | 1 refills | Status: DC | PRN
Start: 2022-03-05 — End: 2022-06-29

## 2022-03-05 NOTE — Progress Notes (Signed)
[facility] Med Check  Patient ID: Brandy [name],  MRN: [id number]  PCP: Martinique, Betty G, MD  Date of Evaluation: 03/05/2022 Time spent:30 minutes  Chief Complaint:  Chief Complaint   Anxiety; Depression; Insomnia; Follow-up     Virtual Visit via Telehealth  I connected with patient by a video enabled telemedicine application with their informed consent, and verified patient privacy and that I am speaking with the correct person using two identifiers.  I am private, in my office and the patient is at home.  Epic crashed and we weren't able to complete video appt.   I discussed the limitations, risks, security and privacy concerns of performing an evaluation and management service by video and the availability of in person appointments. I also discussed with the patient that there may be a patient responsible charge related to this service. The patient expressed understanding and agreed to proceed.   I discussed the assessment and treatment plan with the patient. The patient was provided an opportunity to ask questions and all were answered. The patient agreed with the plan and demonstrated an understanding of the instructions.   The patient was advised to call back or seek an in-person evaluation if the symptoms worsen or if the condition fails to improve as anticipated.  I provided 30 minutes of non-face-to-face time during this encounter.  HISTORY/CURRENT STATUS: Routine med check.   Iris Pert, her 35 yo niece, died on 03/09/22 from a glioblastoma.  Her death was expected but extremely difficult for Ahri, as they were very close.  She takes comfort in knowing that Iris Pert is in heaven but she misses her a lot.  Naureen has had 3 losses within about 6 to 8 weeks time. The first was one of her close neighbors and she was with her when she died.  Then her husband's aunt was killed in a car wreck.  And then Madi died.  Patient's mom has chronic heart disease and is going down fast.  So she  has had a lot of reasons for being sad.  She has been out of work since our last appointment 02/05/2022.  At first she did not think she needed to be out but after thinking about it and discussing with a couple of her family members she decided it would be good for her to take some time off.  She has been spending more time with her daughter, her mom, and Madi's mom.  She feels ready to go back to work in a sense but understands the need to taking care of herself as well as she has taken care of others during this time.  We started Wellbutrin at the last visit and stopped the Prozac.  It has given her some more energy and motivation.  She is doing things around the house as she feels like it.  Still has quite a bit of anxiety and the Klonopin is helpful for that.  Still not sleeping well, has trouble falling asleep and staying asleep.  Thinks it may have gotten a little worse since starting the Wellbutrin.  She is on mirtazapine and that may help her fall asleep but not stay asleep and it makes her extremely hungry.  When she wakes up during the night she is starving.  She has gained weight due to this but also stress eating.  ADLs and personal hygiene are normal.  No suicidal or homicidal thoughts.  Patient denies increased energy with decreased need for sleep, no increased talkativeness, no racing thoughts, no impulsivity  or risky behaviors, no increased spending, no increased libido, no grandiosity, no increased irritability or anger, and no hallucinations.   Denies dizziness, syncope, seizures, numbness, tingling, tremor, tics, unsteady gait, slurred speech, confusion. Denies muscle or joint pain, stiffness, or dystonia. Denies unexplained weight loss, frequent infections, or sores that heal slowly.  No polyphagia, polydipsia, or polyuria. Denies visual changes or paresthesias.   Individual Medical History/ Review of Systems: Changes? :No        Past medications for mental health diagnoses  include: Prozac, Luvox made her 'pass out,' Elavil, Ativan, Xanax, Zoloft 'revved her up,' Seroquel, Lithium for 1 pill, Buspar, Inderal, Cymbalta, NAC, Turmeric  Allergies: Metoclopramide, Penicillins, Erenumab-aooe, Fluvoxamine, Penicillin g benzathine, Sumatriptan, and Zoloft [sertraline hcl]  Current Medications:  Current Outpatient Medications:    buPROPion (WELLBUTRIN XL) 150 MG 24 hr tablet, Take 1 tablet (150 mg total) by mouth daily., Disp: 30 tablet, Rfl: 1   Cholecalciferol (VITAMIN D) 2000 units CAPS, Take 2,000 Units by mouth daily., Disp: , Rfl:    esomeprazole (NEXIUM) 40 MG capsule, TAKE 1 CAPSULE (40 MG TOTAL) BY MOUTH 2 (TWO) TIMES DAILY BEFORE A MEAL., Disp: 180 capsule, Rfl: 1   melatonin 5 MG TABS, Take 5 mg by mouth at bedtime., Disp: , Rfl:    ondansetron (ZOFRAN ODT) 8 MG disintegrating tablet, Take 1 tablet (8 mg total) by mouth every 8 (eight) hours as needed for nausea or vomiting., Disp: 30 tablet, Rfl: 0   Probiotic Product (PROBIOTIC PO), Take 1 capsule by mouth daily., Disp: , Rfl:    traZODone (DESYREL) 100 MG tablet, Take 0.5-1 tablets (50-100 mg total) by mouth at bedtime as needed for sleep., Disp: 30 tablet, Rfl: 1   Vitamin D, Ergocalciferol, (DRISDOL) 1.25 MG (50000 UNIT) CAPS capsule, Take 1 capsule (50,000 Units total) by mouth every 7 (seven) days., Disp: 12 capsule, Rfl: 1   clonazePAM (KLONOPIN) 0.5 MG tablet, Take 1 tablet (0.5 mg total) by mouth 2 (two) times daily as needed for anxiety., Disp: 60 tablet, Rfl: 1 Medication Side Effects: weight gain from mirtazapine.  Family Medical/ Social History: Changes?   MENTAL HEALTH EXAM:  There were no vitals taken for this visit.There is no height or weight on file to calculate BMI.  General Appearance:  unable to assess  Eye Contact:   unable to assess  Speech:  Clear and Coherent and Normal Rate  Volume:  Normal  Mood:   Sad  Affect:   Unable to assess  Thought Process:  Goal Directed and  Descriptions of Associations: Circumstantial  Orientation:  Full (Time, Place, and Person)  Thought Content: Logical   Suicidal Thoughts:  No  Homicidal Thoughts:  No  Memory:  WNL  Judgement:  Good  Insight:  Good  Psychomotor Activity:   Unable to assess  Concentration:  Concentration: Good  Recall:  Good  Fund of Knowledge: Good  Language: Good  Assets:  Desire for Improvement  ADL's:  Intact  Cognition: WNL  Prognosis:  Good    DIAGNOSES:    ICD-10-CM   1. Major depressive disorder, recurrent episode, moderate (HCC)  F33.1     2. Grief  F43.21     3. Hypovitaminosis D  E55.9     4. 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   RECOMMENDATIONS:  PDMP was reviewed.  Last Klonopin filled 12/31/2021.  I provided 30 minutes of non-face-to-face time during this encounter, including time spent before and after the visit in records review, medical decision making, counseling pertinent to today's visit, and charting.  My condolences and the loss of her niece. We discussed changing Wellbutrin SR to XL, that way she will only need to take it once in the morning and hopefully not cause insomnia or mid nocturnal awakening.  She is currently on a total of 200 mg daily, we agreed to go with 150 mg of the XL and increase if needed.  Discontinue mirtazapine due to increased hunger and weight gain. Change Wellbutrin to XL 150 mg, 1 p.o. every morning, it might be preventing good sleep. Continue Klonopin 0.5 mg, 1 p.o. twice daily as needed. Start trazodone 100 mg, 1/2-1 p.o. nightly as needed sleep. Continue vitamin D 50,000 IUs weekly. Continue therapy with Rinaldo Cloud, LCSW. Back to work 03/20/2022. We will discuss ordering vitamin D level at next visit. Return in 4 weeks.  Donnal Moat, PA-C

## 2022-03-16 ENCOUNTER — Telehealth: Payer: Self-pay | Admitting: Physician Assistant

## 2022-03-16 NOTE — Telephone Encounter (Signed)
Pt LVM @ 9:44a.  She said we were sent her Short Term Disability paperwork on 7/3 and she needs to know if it's been sent back.  No upcoming appt scheduled.

## 2022-03-17 ENCOUNTER — Telehealth: Payer: Self-pay | Admitting: Physician Assistant

## 2022-03-17 ENCOUNTER — Ambulatory Visit: Payer: 59 | Admitting: Psychiatry

## 2022-03-17 NOTE — Telephone Encounter (Signed)
Received return to work form. Placed in Traci's box 7/18

## 2022-03-17 NOTE — Progress Notes (Unsigned)
      [facility] Counselor/Therapist Progress Note  Patient ID: Brandy [name], MRN: [id number],    Date: 03/17/2022  Time Spent: ***   Treatment Type: {CHL AMB THERAPY [phone]}  Reported Symptoms: ***  Mental Status Exam:  Appearance:   {PSY:22683}     Behavior:  {PSY:21022743}  Motor:  {PSY:22302}  Speech/Language:   {PSY:22685}  Affect:  {PSY:22687}  Mood:  {PSY:31886}  Thought process:  {PSY:31888}  Thought content:    {PSY:[phone]}  Sensory/Perceptual disturbances:    {[phone]}  Orientation:  {PSY:30297}  Attention:  {PSY:22877}  Concentration:  {[phone]}  Memory:  {PSY:[phone]}  Fund of knowledge:   {[phone]}  Insight:    {[phone]}  Judgment:   {[phone]}  Impulse Control:  {[phone]}   Risk Assessment: Danger to Self:  {PSY:22692} Self-injurious Behavior: {PSY:22692} Danger to Others: {PSY:22692} Duty to Warn:{PSY:311194} Physical Aggression / Violence:{PSY:21197} Access to Firearms a concern: {PSY:21197} Gang Involvement:{PSY:21197}  Subjective: ***   Interventions: {PSY:[id number]}  Diagnosis:No diagnosis found.  Plan: ***  Shanon Ace, LCSW

## 2022-03-18 NOTE — Telephone Encounter (Signed)
I received paper work on 7/18 that Ebonee documented placed in my box to complete. I will have to check on paper work from beginning of month.

## 2022-03-18 NOTE — Telephone Encounter (Signed)
RTW date of 03/20/2022 added to form and left with Helene Kelp to sign.

## 2022-03-24 ENCOUNTER — Ambulatory Visit (INDEPENDENT_AMBULATORY_CARE_PROVIDER_SITE_OTHER): Payer: 59 | Admitting: Psychiatry

## 2022-03-24 DIAGNOSIS — F331 Major depressive disorder, recurrent, moderate: Secondary | ICD-10-CM | POA: Diagnosis not present

## 2022-03-24 NOTE — Progress Notes (Signed)
[facility] Counselor/Therapist Progress Note  Patient ID: Brandy [name], MRN: [id number],    Date: 03/24/2022  Time Spent: 55 minutes   Treatment Type: Individual Therapy  Reported Symptoms: anxiety, depression, sadness  Mental Status Exam:  Appearance:   Casual     Behavior:  Appropriate, Sharing, and Motivated  Motor:  Normal  Speech/Language:   Clear and Coherent  Affect:  Depressed and anxious  Mood:  anxious and depressed  Thought process:  goal directed  Thought content:    WNL  Sensory/Perceptual disturbances:    WNL  Orientation:  oriented to person, place, time/date, situation, day of week, month of year, year, and stated date of March 24, 2022  Attention:  Fair  Concentration:  Fair  Memory:  Reserve of knowledge:   Good  Insight:    Good  Judgment:   Good  Impulse Control:  Good   Risk Assessment: Danger to Self:  No Self-injurious Behavior: No Danger to Others: No Duty to Warn:no Physical Aggression / Violence:No  Access to Firearms a concern: No  Gang Involvement:No   Subjective:   Patient in today reporting anxiety and depression with depression currently being her stronger symptom and related mostly to mom's health and her friend's young daughter "who was like a good niece to me." Trying to help her friend in her grief but also struggling herself emotionally. Tearfully processing a lot of her sadness and some mixed feelings in session today. "I try not to cry around others especially family members." Trying to take better care of herself and has cardiologist appt in early August as "I've had my heart skip a few beats occasionally."Job has changed some and position is now less stressful. Still very stressed about mom's health situation. Has unanswered questions re: mom's condition and encouraged her to ask next time home health make a visit to see mom at home. Very fearful in seeing multiple changes in mom physically and emotionally. Mom is recovering  from blood clot in lung but still having lots of symptoms and behavioral changes.  Working hard to better manage her anxiety, fearfulness, and depression.  Good follow-up from sessions per her report.  Interventions: Cognitive Behavioral Therapy and Ego-Supportive  Treatment goals: Treatment goals remain on treatment plan as patient works with strategies to achieve her goals.  Progress is assessed each session and documented in "subjective" and/or "plan" sections of treatment note. Long-term goal: Reduce overall frequency, level, and intensity of the anxiety so that daily functioning is not impaired.  Short-term goal: Verbalize an understanding of the role that fearful thinking plays in creating fears, excessive worry, and persistent anxiety symptoms. Strategies: Identify and use specific coping strategies for anxiety reduction.   Diagnosis:   ICD-10-CM   1. Major depressive disorder, recurrent episode, moderate (HCC)  F33.1      Plan: Patient today showing good motivation and participation as she focused on her anxiety and depression regarding her mom's health situations worsening, the recent losses of a child who was like a niece to her recently, and of 2 other family friends.  Hard to believe what all has happened recently and she mentions this several times today.  Does seem to be feeling a little more grounded with some improvement in her self-care and trying to set some boundaries as needed, all of which is very important for her own mental and emotional health right now.  Further processes some of her grief today and shared some pictures of  the young lady who died that had been like a niece to her and with whom she is close friends with her mother.  Continues to be very concerned about her mom and worsening of symptoms.  Shared a picture of mom today as well.  Trying to work with some stress reduction strategies and letting others do things to help.  Trying to stay in the present and not  imagine worst case scenarios.  Encouraged to continue more improved self-care and setting of limits (without guilt) so that she can maintain her own health during this very stressful and uncertain time.  Overwhelmed but not to the point she was originally so that his progress. Encouraged patient in practicing more positive behaviors as noted in session including: Staying in the present focusing on what she can change, getting outside some daily and walking, staying in touch with people who are supportive, looking for more positives and negatives, using positive self-talk, staying on her prescribed medication, healthy nutrition and exercise, trying to not assume negatives and worst case scenarios, use inspirational books and helpful podcasts, refrain from self negating, reduce overthinking and over analyzing, challenge and counteract her self doubt, saying no without feeling guilty, interrupting negative/anxious thoughts to challenge and replace them with more reality-based thoughts, and recognize the strength she shows working with goal directed behaviors to move in a direction that supports her improved emotional health and overall outlook  Goal review and progress/challenges noted with patient.  Next appointment within 2 weeks.  This record has been created using [employer].  Chart creation errors have been sought, but may not always have been located and corrected.  Such creation errors do not reflect on the standard of medical care provided.    Shanon Ace, LCSW

## 2022-03-25 ENCOUNTER — Telehealth: Payer: 59 | Admitting: Cardiology

## 2022-03-28 ENCOUNTER — Other Ambulatory Visit: Payer: Self-pay | Admitting: Physician Assistant

## 2022-03-29 ENCOUNTER — Other Ambulatory Visit: Payer: Self-pay | Admitting: Nurse Practitioner

## 2022-04-02 ENCOUNTER — Ambulatory Visit (INDEPENDENT_AMBULATORY_CARE_PROVIDER_SITE_OTHER): Payer: 59 | Admitting: Psychiatry

## 2022-04-02 DIAGNOSIS — F411 Generalized anxiety disorder: Secondary | ICD-10-CM | POA: Diagnosis not present

## 2022-04-02 NOTE — Progress Notes (Signed)
[facility] Counselor/Therapist Progress Note  Patient ID: Brandy [name], MRN: [id number],    Date: 04/02/2022  Time Spent: 55 minutes  Treatment Type: Individual Therapy  Reported Symptoms:  anxiety, depression  Mental Status Exam:  Appearance:   Casual     Behavior:  Appropriate, Sharing, and Motivated  Motor:  Normal  Speech/Language:   Clear and Coherent  Affect:  Depressed and anxious  Mood:  anxious and depressed  Thought process:  goal directed  Thought content:    "I'm blocking things out."  Sensory/Perceptual disturbances:    WNL  Orientation:  oriented to person, place, time/date, situation, day of week, month of year, year, and stated date of Aug. 3, 2023  Attention:  Good  Concentration:  Good  Memory:  WNL  Fund of knowledge:   Good  Insight:    Good and Fair  Judgment:   Good  Impulse Control:  Good   Risk Assessment: Danger to Self:  No Self-injurious Behavior: No Danger to Others: No Duty to Warn:no Physical Aggression / Violence:No  Access to Firearms a concern: No  Gang Involvement:No   Subjective: Patient today reporting continued anxiety and depression, especially in dealing with her mom's illness and death of a niece.  Went with mom to her Dr yesterday as her condition seems to worsen, nauseated all the time, lost 40 lbs.Still feels there's lots of unanswered questions and mom is to see a neurologist next. Patient overwhelmed with mom's condition, hard to be around her because it "tears my nerves up." "I'm a fixer but can't fix this. " Tearfully shared her fears and anxieties along with some depression. Very worried about her mother and very hard to watch her decline. "Just having a very hard time right now."  Is taking her Wellbutrin, trying to get sleep at night, staying in touch with minister, contact with friends, time with supportive husband, but struggling with some over-eating  for comfort which we discussed more at length as well as healthier  choices for her. Concentrated much more on self care and she responded well and seemed to understand how important it is for her and in her efforts to support others.   Interventions: Cognitive Behavioral Therapy and Ego-Supportive   Treatment goals: Treatment goals remain on treatment plan as patient works with strategies to achieve her goals.  Progress is assessed each session and documented in "subjective" and/or "plan" sections of treatment note. Long-term goal: Reduce overall frequency, level, and intensity of the anxiety so that daily functioning is not impaired.  Short-term goal: Verbalize an understanding of the role that fearful thinking plays in creating fears, excessive worry, and persistent anxiety symptoms. Strategies: Identify and use specific coping strategies for anxiety reduction.    Diagnosis:   ICD-10-CM   1. Generalized anxiety disorder  F41.1      Plan:  Patient today showing good participation and motivation working on her depression and anxiety relating mostly to her concerns about her mother who is having some rather severe symptoms and doctors cannot yet determine what is causing it.  Patient encouraged to let other family members help her also in supporting mom.  As noted above she needed most of session to work with her fears and anxieties, along with her depression as she is very worried about her mother and finds it hard to watch her declining and not know what to do to help.  Tried to emphasize the positive support she is to her mother and  that her presence means a lot to her mother, however patient also needing to take care of herself in order to stay well, and also in helping meet the needs of her own preschool daughter.  Provide safe space for patient to let out a lot of feelings that she was trying to hide from others, which seemed to be of benefit to her today.  Also emphasized her need for much better self-care including more positive self talk, getting enough  sleep, good nutrition and exercise, and setting limits as she needs to and that others in the family help as well.  Patient felt the focus on these behaviors was very much on target. Encouraged patient  in practicing more positive behaviors including: Staying in the present focusing on what she can change or control, getting outside some daily and walking, staying in touch with people who are supportive, looking for more positives versus negatives, positive self talk, staying on her prescribed medication, healthy nutrition and exercise, trying not to assume negatives and worst case scenarios, refrain from self negating, reduce overthinking and over analyzing, challenge and counteract her self doubt, saying no when she needs to say no without guilt, interrupting her negative/anxious thoughts to challenge and replace them with more reality based thoughts, and realize the strength she shows working with goal directed behaviors to move in a direction that supports her improved emotional health.  Goal reivew and progress/challenges noted with patient.  Next appt in approx. 5-6 weeks.  This record has been created using [employer].  Chart creation errors have been sought, but may not always have been located and corrected.  Such creation errors do not reflect on the standard of medical care provided.   Shanon Ace, LCSW

## 2022-04-04 ENCOUNTER — Other Ambulatory Visit: Payer: Self-pay | Admitting: Physician Assistant

## 2022-04-07 ENCOUNTER — Ambulatory Visit: Payer: 59 | Admitting: Cardiology

## 2022-04-07 ENCOUNTER — Encounter: Payer: Self-pay | Admitting: Cardiology

## 2022-04-08 ENCOUNTER — Ambulatory Visit: Payer: 59 | Admitting: Psychiatry

## 2022-04-08 ENCOUNTER — Encounter (INDEPENDENT_AMBULATORY_CARE_PROVIDER_SITE_OTHER): Payer: Self-pay

## 2022-04-08 NOTE — Progress Notes (Deleted)
   I, Peterson Lombard, LAT, ATC acting as a scribe for Lynne Leader, MD.  Brandy [name] is a 36 y.o. female who presents to [place] at [facility] today for f/u midline thoracic back pain. Pt was last seen by Dr. Georgina Snell on 02/26/22 and was referred to [facility] in [place]. Today, pt reports  Dx imaging: 01/10/22 Chest XR  Pertinent review of systems: ***  Relevant historical information: ***   Exam:  There were no vitals taken for this visit. General: Well Developed, well nourished, and in no acute distress.   MSK: ***    Lab and Radiology Results No results found for this or any previous visit (from the past 72 hour(s)). No results found.     Assessment and Plan: 36 y.o. female with ***   PDMP not reviewed this encounter. No orders of the defined types were placed in this encounter.  No orders of the defined types were placed in this encounter.    Discussed warning signs or symptoms. Please see discharge instructions. Patient expresses understanding.   ***

## 2022-04-09 ENCOUNTER — Ambulatory Visit: Payer: 59 | Admitting: Family Medicine

## 2022-04-23 ENCOUNTER — Ambulatory Visit: Payer: 59 | Admitting: Psychiatry

## 2022-04-23 NOTE — Progress Notes (Unsigned)
[facility] Counselor/Therapist Progress Note  Patient ID: Brandy [name], MRN: [id number],    Date: 04/23/2022  Time Spent: ***   Treatment Type: {[facility] AMB THERAPY TYPES:[id number]}  Reported Symptoms: ***  Mental Status Exam:  Appearance:   {PSY:22683}     Behavior:  {PSY:21022743}  Motor:  {PSY:22302}  Speech/Language:   {PSY:22685}  Affect:  {PSY:22687}  Mood:  {PSY:31886}  Thought process:  {PSY:31888}  Thought content:    {PSY:912 414 1209}  Sensory/Perceptual disturbances:    {PSY:[id number]}  Orientation:  {PSY:30297}  Attention:  {PSY:22877}  Concentration:  {PSY:8200827489}  Memory:  {PSY:870 144 8552}  Fund of knowledge:   {PSY:8200827489}  Insight:    {PSY:8200827489}  Judgment:   {PSY:8200827489}  Impulse Control:  {PSY:[id number]}   Risk Assessment: Danger to Self:  {PSY:22692} Self-injurious Behavior: {PSY:22692} Danger to Others: {PSY:22692} Duty to Warn:{PSY:311194} Physical Aggression / Violence:{PSY:21197} Access to Firearms a concern: {PSY:21197} Gang Involvement:{PSY:21197}  Subjective: ***     Patient today reporting continued anxiety and depression, especially in dealing with her mom's illness and death of a niece.  Went with mom to her Dr yesterday as her condition seems to worsen, nauseated all the time, lost 40 lbs.Still feels there's lots of unanswered questions and mom is to see a neurologist next. Patient overwhelmed with mom's condition, hard to be around her because it "tears my nerves up." "I'm a fixer but can't fix this. " Tearfully shared her fears and anxieties along with some depression. Very worried about her mother and very hard to watch her decline. "Just having a very hard time right now."  Is taking her Wellbutrin, trying to get sleep at night, staying in touch with minister, contact with friends, time with supportive husband, but struggling with some over-eating  for comfort which we discussed more at length as well as healthier  choices for her. Concentrated much more on self care and she responded well and seemed to understand how important it is for her and in her efforts to support others.     Interventions: {PSY:[phone]}  Treatment goals: Treatment goals remain on treatment plan as patient works with strategies to achieve her goals.  Progress is assessed each session and documented in "subjective" and/or "plan" sections of treatment note. Long-term goal: Reduce overall frequency, level, and intensity of the anxiety so that daily functioning is not impaired.  Short-term goal: Verbalize an understanding of the role that fearful thinking plays in creating fears, excessive worry, and persistent anxiety symptoms. Strategies: Identify and use specific coping strategies for anxiety reduction.    Diagnosis:No diagnosis found.  Plan: ***    Patient today showing good participation and motivation working on her depression and anxiety relating mostly to her concerns about her mother who is having some rather severe symptoms and doctors cannot yet determine what is causing it.  Patient encouraged to let other family members help her also in supporting mom.  As noted above she needed most of session to work with her fears and anxieties, along with her depression as she is very worried about her mother and finds it hard to watch her declining and not know what to do to help.  Tried to emphasize the positive support she is to her mother and that her presence means a lot to her mother, however patient also needing to take care of herself in order to stay well, and also in helping meet the needs of her own preschool daughter.  Provide safe space for patient to let out a  lot of feelings that she was trying to hide from others, which seemed to be of benefit to her today.  Also emphasized her need for much better self-care including more positive self talk, getting enough sleep, good nutrition and exercise, and setting limits as she  needs to and that others in the family help as well.  Patient felt the focus on these behaviors was very much on target.     //////////////////////////////////////////////////////////////////////////////////////////////////////////////////////////////////////////////////////////   Encouraged patient in her practice of more positive behaviors including: Getting outside some daily and walking, remaining in touch with people who are supportive, seeing more positives versus negatives, seeing the positives within herself, staying on her prescribed medication, positive self talk, staying in the present focusing on what she can change or control, healthy nutrition and exercise, trying not to assume negatives and worst case scenarios, refrain from self negating, reduce overthinking and over analyzing, challenge and counteract her self doubt, saying no when she needs to say no without guilt, interrupting her negative/anxious thoughts to challenge and replace them with more reality based and empowering thoughts, and recognize the strength she shows working with goal directed behaviors to move in a direction that supports her improved emotional health and overall outlook.  Goal review and progress/challenges noted with patient.  Next appointment within 2 to 3 weeks.  This record has been created using [employer].  Chart creation errors have been sought, but may not always have been located and corrected.  Such creation errors do not reflect on the standard of medical care provided.   Shanon Ace, LCSW

## 2022-04-26 IMAGING — US US ABDOMEN COMPLETE
1 series · 14 of 25 positions shown · non-contrast
Comparison: CT abdomen and pelvis 08/08/2018

CLINICAL DATA: Nausea, epigastric abdominal pain

EXAM:
ABDOMEN ULTRASOUND COMPLETE

[Series 1: us abdomen complete · 14 of 41 slices shown]
[im 1/41]
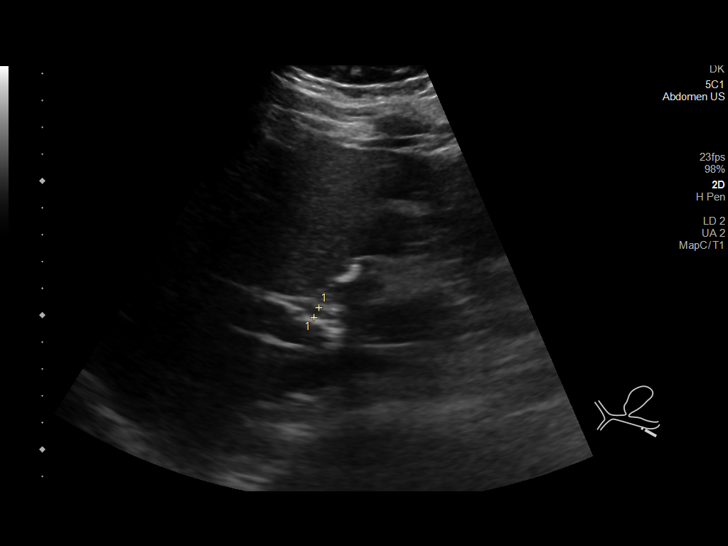
[im 4/41]
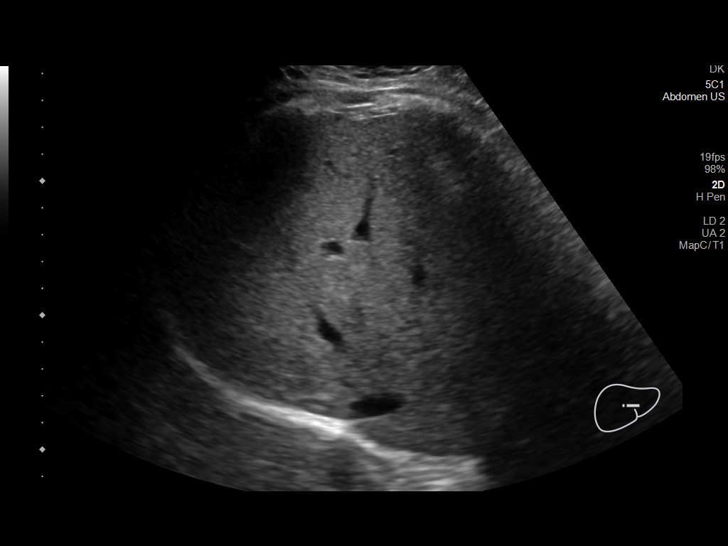
[im 7/41]
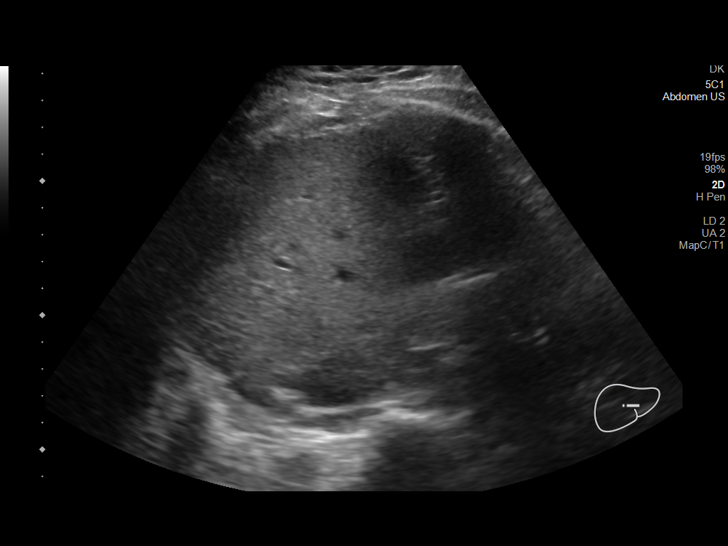
[im 11/41]
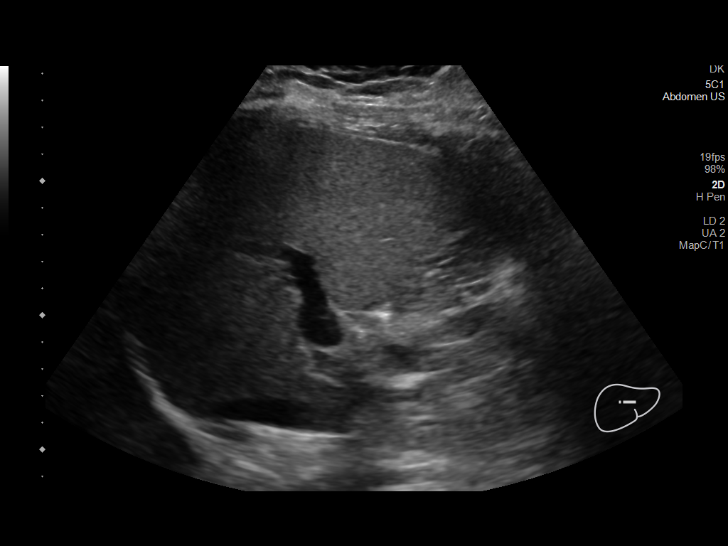
[im 14/41]
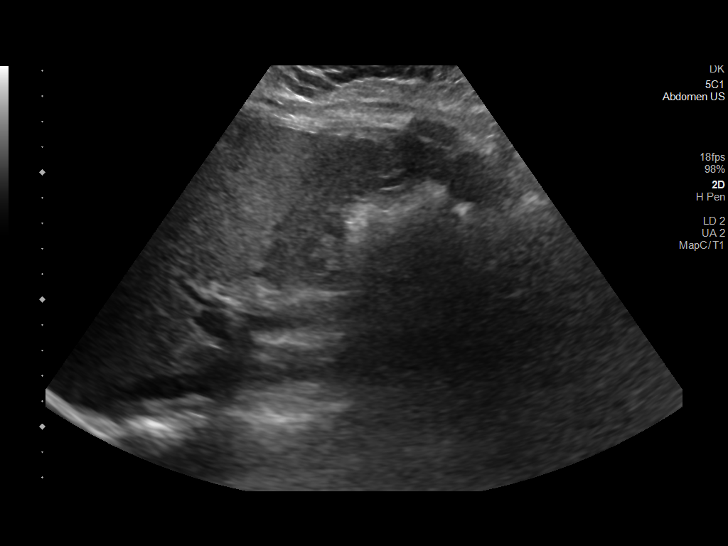
[im 16/41]
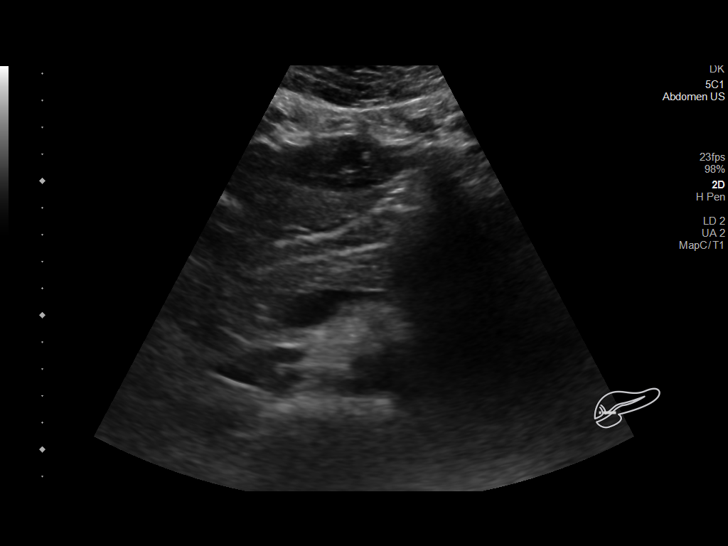
[im 19/41]
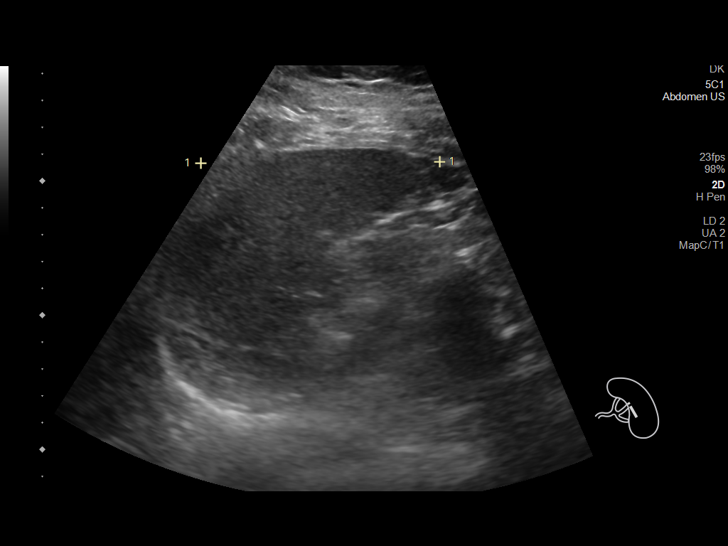
[im 22/41]
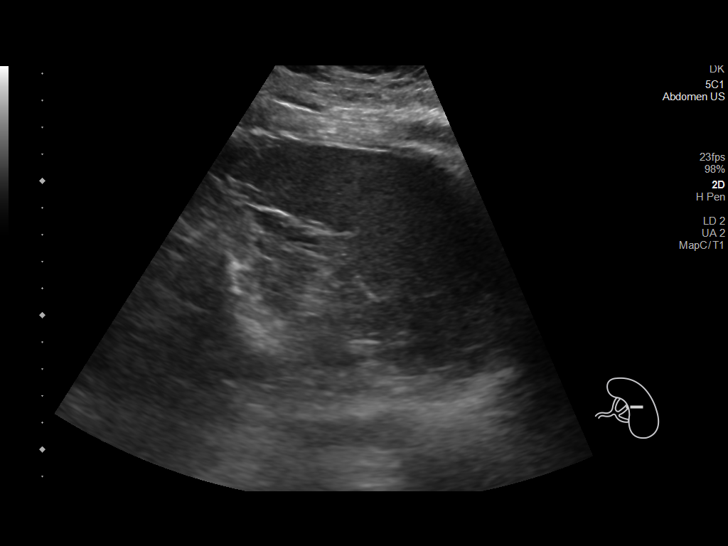
[im 26/41]
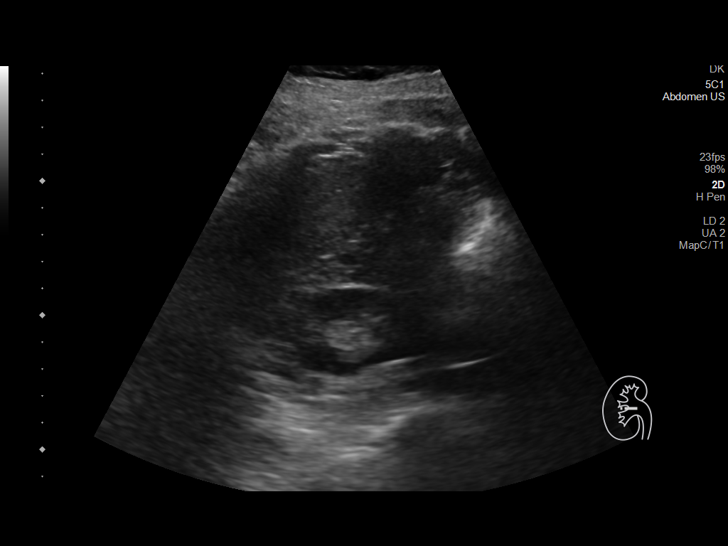
[im 27/41]
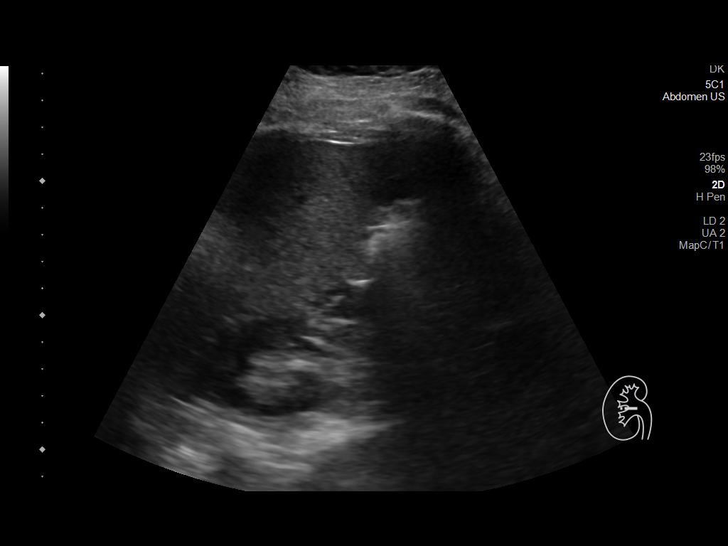
[im 31/41]
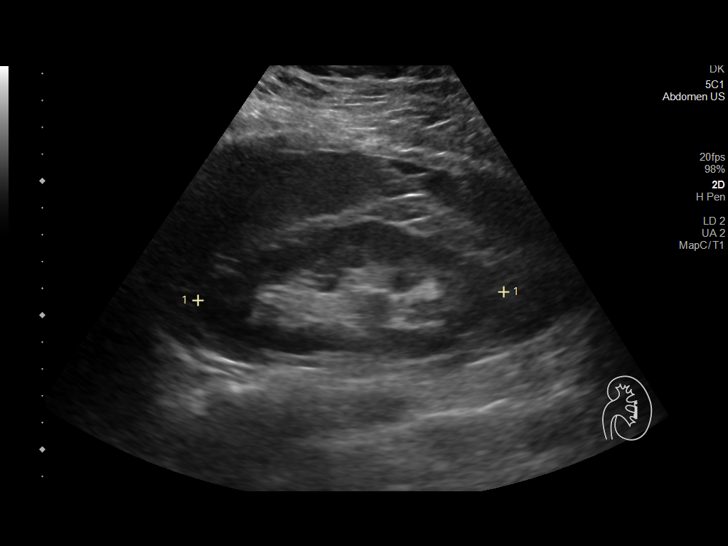
[im 34/41]
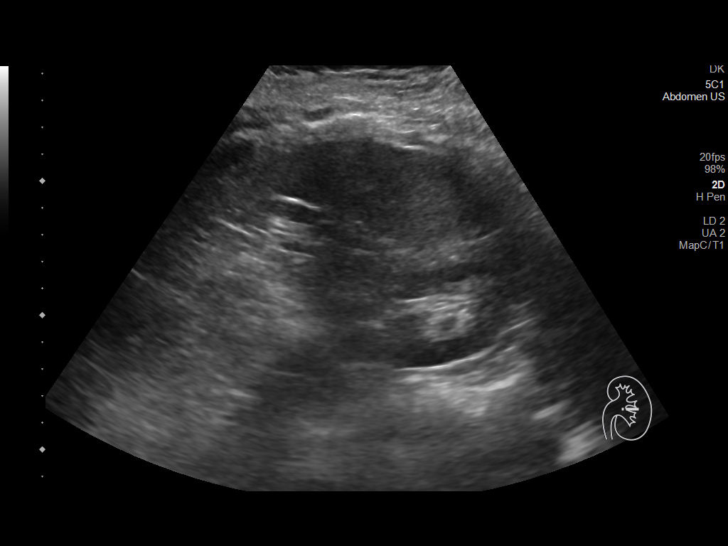
[im 37/41]
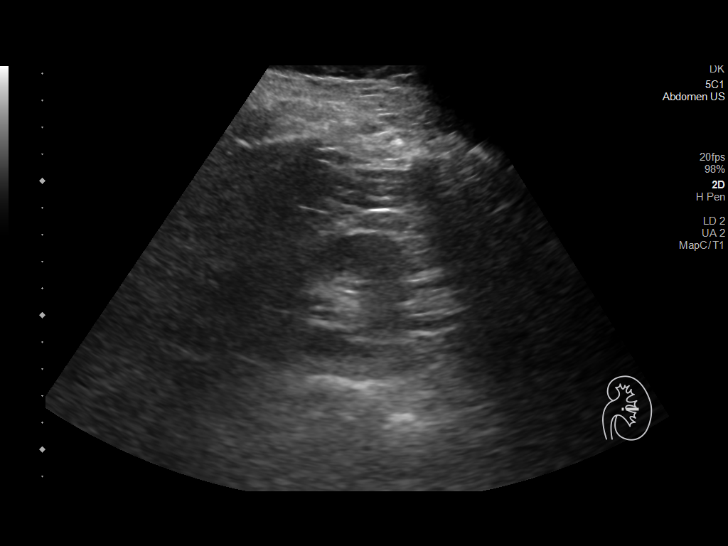
[im 41/41]
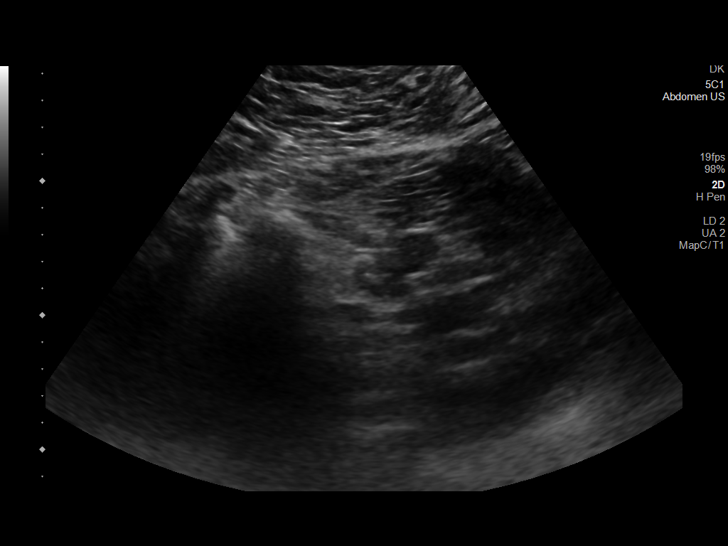

[14 of 25 positions shown; findings below may reference images not displayed]

FINDINGS: Gallbladder: Surgically absent

Common bile duct: Diameter: 4 mm, normal

Liver: Echogenic parenchyma, likely fatty infiltration though this
can be seen with cirrhosis and certain infiltrative disorders. No
focal hepatic mass or nodularity. Portal vein is patent on color
Doppler imaging with normal direction of blood flow towards the
liver.

IVC: Normal appearance

Pancreas: Tail obscured by bowel gas. Visualized portion normal
appearance

Spleen: Normal appearance, 9.8 cm length

Right Kidney: Length: 9.5 cm. Normal morphology without mass or
hydronephrosis.

Left Kidney: Length: 11.4 cm. Normal morphology without mass or
hydronephrosis.

Abdominal aorta: Normal caliber

Other findings: No free fluid
IMPRESSION: Post cholecystectomy.

Probable fatty infiltration of liver as above.

Incomplete pancreatic tail visualization.

No acute upper abdominal sonographic abnormalities.

## 2022-04-27 ENCOUNTER — Other Ambulatory Visit: Payer: Self-pay | Admitting: Nurse Practitioner

## 2022-04-27 ENCOUNTER — Telehealth: Payer: Self-pay | Admitting: Nurse Practitioner

## 2022-04-27 NOTE — Telephone Encounter (Signed)
Received a call from patient.  She needs a refill of her Nexium.  She said the pharmacy was supposed to reach out to [place] to request this, but she hasn't heard anything and she is out.  Also of note, she went to [facility] in April 2022 and had an EGD.  She said the only reason she went there is because they could do the procedure sooner than we could and her issues were such that she needed to get it done sooner rather than later.  Tried to explain to patient that if another gastroenterologist did a procedure on her, then they assumed her care.  She said she did not want to go to them and only went there because they could do the procedure sooner.  With that being said, can we refill this for her?  Please call patient and advise.

## 2022-04-27 NOTE — Telephone Encounter (Signed)
Called and spoke with patient. I have advised that I went ahead and sent in a refill, but since she was seen at another office that they will need to take over refilling her medication. I advised that she would have to get permission from Dr. Carlean Purl about being seen in the office again. I advised her of the updated policy that if a patient has been seen at another GI office they had to get permission to be seen by our office as well as transferring care to another office and coming back. Patient expressed understanding.

## 2022-05-12 ENCOUNTER — Ambulatory Visit (INDEPENDENT_AMBULATORY_CARE_PROVIDER_SITE_OTHER): Payer: Self-pay | Admitting: Psychiatry

## 2022-05-12 DIAGNOSIS — F411 Generalized anxiety disorder: Secondary | ICD-10-CM

## 2022-05-12 NOTE — Progress Notes (Signed)
Patient no-showed for appt.  Brandy [name]

## 2022-05-13 ENCOUNTER — Encounter (HOSPITAL_BASED_OUTPATIENT_CLINIC_OR_DEPARTMENT_OTHER): Payer: Self-pay

## 2022-05-13 ENCOUNTER — Emergency Department (HOSPITAL_BASED_OUTPATIENT_CLINIC_OR_DEPARTMENT_OTHER): Payer: 59

## 2022-05-13 ENCOUNTER — Emergency Department (HOSPITAL_BASED_OUTPATIENT_CLINIC_OR_DEPARTMENT_OTHER)
Admission: EM | Admit: 2022-05-13 | Discharge: 2022-05-13 | Disposition: A | Discharge status: Home / Self Care | Payer: 59 | Attending: Emergency Medicine | Admitting: Emergency Medicine

## 2022-05-13 ENCOUNTER — Other Ambulatory Visit (HOSPITAL_BASED_OUTPATIENT_CLINIC_OR_DEPARTMENT_OTHER): Payer: Self-pay

## 2022-05-13 DIAGNOSIS — Z20822 Contact with and (suspected) exposure to covid-19: Secondary | ICD-10-CM | POA: Diagnosis not present

## 2022-05-13 DIAGNOSIS — R1013 Epigastric pain: Secondary | ICD-10-CM | POA: Insufficient documentation

## 2022-05-13 DIAGNOSIS — R112 Nausea with vomiting, unspecified: Secondary | ICD-10-CM

## 2022-05-13 DIAGNOSIS — R002 Palpitations: Secondary | ICD-10-CM | POA: Diagnosis not present

## 2022-05-13 DIAGNOSIS — R079 Chest pain, unspecified: Secondary | ICD-10-CM | POA: Diagnosis not present

## 2022-05-13 DIAGNOSIS — E860 Dehydration: Secondary | ICD-10-CM | POA: Insufficient documentation

## 2022-05-13 DIAGNOSIS — R197 Diarrhea, unspecified: Secondary | ICD-10-CM | POA: Insufficient documentation

## 2022-05-13 DIAGNOSIS — R55 Syncope and collapse: Secondary | ICD-10-CM | POA: Insufficient documentation

## 2022-05-13 DIAGNOSIS — R531 Weakness: Secondary | ICD-10-CM | POA: Diagnosis present

## 2022-05-13 LAB — CBC
HCT: 39.3 % (ref 36.0–46.0)
Hemoglobin: 12.8 g/dL (ref 12.0–15.0)
MCH: 27.8 pg (ref 26.0–34.0)
MCHC: 32.6 g/dL (ref 30.0–36.0)
MCV: 85.2 fL (ref 80.0–100.0)
Platelets: 391 10*3/uL (ref 150–400)
RBC: 4.61 MIL/uL (ref 3.87–5.11)
RDW: 13.4 % (ref 11.5–15.5)
WBC: 5.9 10*3/uL (ref 4.0–10.5)
nRBC: 0 % (ref 0.0–0.2)

## 2022-05-13 LAB — URINALYSIS, ROUTINE W REFLEX MICROSCOPIC
Bilirubin Urine: NEGATIVE
Glucose, UA: NEGATIVE mg/dL
Ketones, ur: NEGATIVE mg/dL
Nitrite: NEGATIVE
Protein, ur: NEGATIVE mg/dL
Specific Gravity, Urine: 1.011 (ref 1.005–1.030)
pH: 6 (ref 5.0–8.0)

## 2022-05-13 LAB — COMPREHENSIVE METABOLIC PANEL
ALT: 43 U/L (ref 0–44)
AST: 42 U/L — ABNORMAL HIGH (ref 15–41)
Albumin: 4.6 g/dL (ref 3.5–5.0)
Alkaline Phosphatase: 61 U/L (ref 38–126)
Anion gap: 11 (ref 5–15)
BUN: 8 mg/dL (ref 6–20)
CO2: 27 mmol/L (ref 22–32)
Calcium: 9.6 mg/dL (ref 8.9–10.3)
Chloride: 103 mmol/L (ref 98–111)
Creatinine, Ser: 0.95 mg/dL (ref 0.44–1.00)
GFR, Estimated: >60 mL/min (ref >60)
Glucose, Bld: 119 mg/dL — ABNORMAL HIGH (ref 70–99)
Potassium: 4 mmol/L (ref 3.5–5.1)
Sodium: 141 mmol/L (ref 135–145)
Total Bilirubin: 0.4 mg/dL (ref 0.3–1.2)
Total Protein: 7.6 g/dL (ref 6.5–8.1)

## 2022-05-13 LAB — TROPONIN I (HIGH SENSITIVITY): Troponin I (High Sensitivity): <2 ng/L (ref <18)

## 2022-05-13 LAB — D-DIMER, QUANTITATIVE: D-Dimer, Quant: 0.54 ug/mL-FEU — ABNORMAL HIGH (ref 0.00–0.50)

## 2022-05-13 LAB — RESP PANEL BY RT-PCR (FLU A&B, COVID) ARPGX2
Influenza A by PCR: NEGATIVE
Influenza B by PCR: NEGATIVE
SARS Coronavirus 2 by RT PCR: NEGATIVE

## 2022-05-13 LAB — LIPASE, BLOOD: Lipase: 27 U/L (ref 11–51)

## 2022-05-13 LAB — PREGNANCY, URINE: Preg Test, Ur: NEGATIVE

## 2022-05-13 MED ORDER — ONDANSETRON HCL 4 MG/2ML IJ SOLN
4.0000 mg | Freq: Once | INTRAMUSCULAR | Status: AC
  Administered 2022-05-13: 4 mg via INTRAVENOUS
  Filled 2022-05-13: qty 2

## 2022-05-13 MED ORDER — FAMOTIDINE IN NACL 20-0.9 MG/50ML-% IV SOLN
20.0000 mg | Freq: Once | INTRAVENOUS | Status: AC
  Administered 2022-05-13: 20 mg via INTRAVENOUS
  Filled 2022-05-13: qty 50

## 2022-05-13 MED ORDER — LACTATED RINGERS IV BOLUS
1000.0000 mL | Freq: Once | INTRAVENOUS | Status: AC
  Administered 2022-05-13: 1000 mL via INTRAVENOUS

## 2022-05-13 MED ORDER — IOHEXOL 350 MG/ML SOLN
100.0000 mL | Freq: Once | INTRAVENOUS | Status: AC | PRN
  Administered 2022-05-13: 75 mL via INTRAVENOUS

## 2022-05-13 MED ORDER — ONDANSETRON 8 MG PO TBDP
8.0000 mg | ORAL_TABLET | Freq: Three times a day (TID) | ORAL | 0 refills | Status: DC | PRN
  Filled 2022-05-13: qty 8, 3d supply, fill #0

## 2022-05-13 NOTE — ED Triage Notes (Signed)
Pt c/o diarrhea x 4 days. Pt states yesterday she had near syncope and palpitations. Pt states she has not had emesis. Pt endorses weakness.

## 2022-05-13 NOTE — ED Provider Notes (Signed)
  Physical Exam  BP 108/68   Pulse 82   Temp 98.2 F (36.8 C)   Resp 18   Ht '5\' 9"'$  (1.753 m)   Wt 113.4 kg   LMP 05/07/2022 (Approximate)   SpO2 99%   BMI 36.92 kg/m   Physical Exam  Procedures  Procedures  ED Course / MDM    Medical Decision Making Amount and/or Complexity of Data Reviewed Labs: ordered. Radiology: ordered.  Risk Prescription drug management.   Received patient signout.  Nausea vomiting diarrhea and palpitations.  Diarrhea for 4 days.  Lab work reassuring.  D-dimer had been done was elevated.  Negative CTA.  Feels somewhat better after fluid boluses will discharge home with symptomatic treatment.  Outpatient follow-up as needed.       Davonna Belling, MD 05/13/22 [phone]

## 2022-05-13 NOTE — ED Provider Notes (Signed)
[facility] EMERGENCY DEPT Provider Note   CSN: [id number] Arrival date & time: 05/13/22  1207     History  Chief Complaint  Patient presents with   Diarrhea    Brandy [name] is a 36 y.o. female.   Diarrhea    35 year old female with medical history significant for GERD, migraine headaches, anxiety, nephrolithiasis, depression, who presents to the emergency department with multiple complaints.  The patient states that she has had a diarrheal illness for the last 4 days.  She endorses mild nausea, denies any vomiting.  She denies any blood in her stool.  She endorses generalized weakness and fatigue and feels dehydrated.  She has been trying to maintain oral intake with Gatorade.  She states that yesterday she felt lightheaded and had an episode of near syncope with associated palpitations.  She also states that she has had chest pain described as substernal and epigastric radiating to her back.  She denies any history of pancreatitis. She has had a cholecystectomy.  Home Medications Prior to Admission medications   Medication Sig Start Date End Date Taking? Authorizing Provider  buPROPion (WELLBUTRIN XL) 150 MG 24 hr tablet Take 1 tablet (150 mg total) by mouth daily. 03/05/22   Addison Lank, PA-C  Cholecalciferol (VITAMIN D) 2000 units CAPS Take 2,000 Units by mouth daily.    [provider]  clonazePAM (KLONOPIN) 0.5 MG tablet Take 1 tablet (0.5 mg total) by mouth 2 (two) times daily as needed for anxiety. 03/05/22   Donnal Moat T, PA-C  diltiazem (CARDIZEM CD) 120 MG 24 hr capsule Take 120 mg by mouth daily. 04/12/22   [provider]  esomeprazole (NEXIUM) 40 MG capsule TAKE 1 CAPSULE (40 MG TOTAL) BY MOUTH 2 (TWO) TIMES DAILY BEFORE A MEAL. 04/27/22   Noralyn Pick, NP  melatonin 5 MG TABS Take 5 mg by mouth at bedtime.    [provider]  mirtazapine (REMERON) 7.5 MG tablet Take 7.5 mg by mouth at bedtime as needed. 03/30/22    [provider]  ondansetron (ZOFRAN-ODT) 8 MG disintegrating tablet Take 1 tablet (8 mg total) by mouth every 8 (eight) hours as needed for nausea or vomiting. 05/13/22   Davonna Belling, MD  Probiotic Product (PROBIOTIC PO) Take 1 capsule by mouth daily.    [provider]  traZODone (DESYREL) 100 MG tablet Take 0.5-1 tablets (50-100 mg total) by mouth at bedtime as needed for sleep. 03/05/22   Donnal Moat T, PA-C  Vitamin D, Ergocalciferol, (DRISDOL) 1.25 MG (50000 UNIT) CAPS capsule Take 1 capsule (50,000 Units total) by mouth every 7 (seven) days. 02/05/22   Donnal Moat T, PA-C  DULoxetine (CYMBALTA) 20 MG capsule Take 1 capsule (20 mg total) by mouth daily. Patient not taking: Reported on 03/20/2019 01/11/19 06/21/19  Donnal Moat T, PA-C      Allergies    Metoclopramide, Penicillins, Erenumab-aooe, Fluvoxamine, Penicillin g benzathine, Sumatriptan, and Zoloft [sertraline hcl]    Review of Systems   Review of Systems  Gastrointestinal:  Positive for diarrhea.    Physical Exam Updated Vital Signs BP 97/69 (BP Location: Left Arm)   Pulse 75   Temp 98.2 F (36.8 C)   Resp 16   Ht '5\' 9"'$  (1.753 m)   Wt 113.4 kg   LMP 05/07/2022 (Approximate)   SpO2 100%   BMI 36.92 kg/m  Physical Exam Vitals and nursing note reviewed.  Constitutional:      General: She is not in acute distress.  Appearance: She is well-developed.  HENT:     Head: Normocephalic and atraumatic.     Mouth/Throat:     Mouth: Mucous membranes are dry.  Eyes:     Conjunctiva/sclera: Conjunctivae normal.  Cardiovascular:     Rate and Rhythm: Normal rate and regular rhythm.  Pulmonary:     Effort: Pulmonary effort is normal. No respiratory distress.     Breath sounds: Normal breath sounds.  Abdominal:     Palpations: Abdomen is soft.     Tenderness: There is no abdominal tenderness. There is no guarding or rebound.  Musculoskeletal:        General: No swelling.     Cervical back: Neck  supple.     Right lower leg: No edema.     Left lower leg: No edema.  Skin:    General: Skin is warm and dry.     Capillary Refill: Capillary refill takes less than 2 seconds.  Neurological:     Mental Status: She is alert.  Psychiatric:        Mood and Affect: Mood normal.     ED Results / Procedures / Treatments   Labs (all labs ordered are listed, but only abnormal results are displayed) Labs Reviewed  COMPREHENSIVE METABOLIC PANEL - Abnormal; Notable for the following components:      Result Value   Glucose, Bld 119 (*)    AST 42 (*)    All other components within normal limits  URINALYSIS, ROUTINE W REFLEX MICROSCOPIC - Abnormal; Notable for the following components:   Hgb urine dipstick TRACE (*)    Leukocytes,Ua TRACE (*)    All other components within normal limits  D-DIMER, QUANTITATIVE - Abnormal; Notable for the following components:   D-Dimer, Quant 0.54 (*)    All other components within normal limits  RESP PANEL BY RT-PCR (FLU A&B, COVID) ARPGX2  LIPASE, BLOOD  CBC  PREGNANCY, URINE  TROPONIN I (HIGH SENSITIVITY)    EKG EKG Interpretation  Date/Time:  Wednesday May 13 2022 12:19:25 EDT Ventricular Rate:  84 PR Interval:  146 QRS Duration: 78 QT Interval:  364 QTC Calculation: 430 R Axis:   39 Text Interpretation: Normal sinus rhythm S1Q3T3 T wave inversions in lead III unchanged from prior EKGs Reconfirmed by Regan Lemming ([id number]) on 05/13/2022 2:03:53 PM  Radiology CT Angio Chest PE W and/or Wo Contrast  Result Date: 05/13/2022 CLINICAL DATA:  Near-syncope and palpitations. EXAM: CT ANGIOGRAPHY CHEST WITH CONTRAST TECHNIQUE: Multidetector CT imaging of the chest was performed using the standard protocol during bolus administration of intravenous contrast. Multiplanar CT image reconstructions and MIPs were obtained to evaluate the vascular anatomy. RADIATION DOSE REDUCTION: This exam was performed according to the departmental dose-optimization  program which includes automated exposure control, adjustment of the mA and/or kV according to patient size and/or use of iterative reconstruction technique. CONTRAST:  9m OMNIPAQUE IOHEXOL 350 MG/ML SOLN COMPARISON:  Chest x-ray from same day. Coronary CTA dated July 02, 2021. CTA chest dated April 05, 2020. FINDINGS: Cardiovascular: Satisfactory opacification of the pulmonary arteries to the segmental level. No evidence of pulmonary embolism. Normal heart size. No pericardial effusion. Mediastinum/Nodes: No enlarged mediastinal, hilar, or axillary lymph nodes. Thyroid gland, trachea, and esophagus demonstrate no significant findings. Lungs/Pleura: No focal consolidation, pleural effusion, or pneumothorax. 4 mm subpleural nodule in the left lower lobe is unchanged dating back to October 2020, benign. No follow-up imaging is recommended. Upper Abdomen: No acute abnormality. Musculoskeletal: No chest wall abnormality. No  acute or significant osseous findings. Review of the MIP images confirms the above findings. IMPRESSION: 1. No evidence of pulmonary embolism. No acute intrathoracic process. Electronically Signed   By: Titus Dubin M.D.   On: 05/13/2022 16:04   DG Chest Portable 1 View  Result Date: 05/13/2022 CLINICAL DATA:  Diarrhea, central chest pain for 4 days, near syncope, palpitations, weakness EXAM: PORTABLE CHEST 1 VIEW COMPARISON:  Portable exam 1405 hours compared to 11/10/2021 FINDINGS: Normal heart size, mediastinal contours, and pulmonary vascularity. Lungs clear. No pleural effusion or pneumothorax. Bones unremarkable. IMPRESSION: No acute abnormalities. Electronically Signed   By: Lavonia Dana M.D.   On: 05/13/2022 14:15    Procedures Procedures    Medications Ordered in ED Medications  ondansetron (ZOFRAN) injection 4 mg (4 mg Intravenous Given 05/13/22 1436)  lactated ringers bolus 1,000 mL (1,000 mLs Intravenous New Bag/Given 05/13/22 1436)  famotidine (PEPCID) IVPB 20 mg  premix (0 mg Intravenous Stopped 05/13/22 1539)  iohexol (OMNIPAQUE) 350 MG/ML injection 100 mL (75 mLs Intravenous Contrast Given 05/13/22 1543)  ondansetron (ZOFRAN) injection 4 mg (4 mg Intravenous Given 05/13/22 1553)    ED Course/ Medical Decision Making/ A&P                           Medical Decision Making Amount and/or Complexity of Data Reviewed Labs: ordered. Radiology: ordered.  Risk Prescription drug management.    36 year old female with medical history significant for GERD, migraine headaches, anxiety, nephrolithiasis, depression, who presents to the emergency department with multiple complaints.  The patient states that she has had a diarrheal illness for the last 4 days.  She endorses mild nausea, denies any vomiting.  She denies any blood in her stool.  She endorses generalized weakness and fatigue and feels dehydrated.  She has been trying to maintain oral intake with Gatorade.  She states that yesterday she felt lightheaded and had an episode of near syncope with associated palpitations.  She also states that she has had chest pain described as substernal and epigastric radiating to her back.  She denies any history of pancreatitis. She has had a cholecystectomy.  On arrival, the patient was vitally stable.  NSR noted on cardiac telemetry, afebrile, overall well-appearing.  Dry mucous membranes were noted on exam.  Her physical exam was otherwise unremarkable.  Her abdomen was soft, nontender, nondistended with no rebound or guarding.  Patient with an episode of lightheadedness and dehydration in the setting of a likely diarrheal illness.  An EKG was performed which revealed sinus rhythm, ventricular rate 84, S1Q3T3 noted on EKG. due to this, D-dimer was sent and was mildly elevated.  Chest x-ray revealed no acute cardiac or pulm abnormality.  Urinalysis with trace hematuria.  Patient has no CVA tenderness, no abdominal pain to suggest nephrolithiasis.  No evidence for UTI.   Patient just finishing her menstrual cycle.  CBC without leukocytosis or anemia, lipase normal, troponin negative, COVID-19 influenza PCR testing negative, CMP unremarkable.  Urine pregnancy negative.  With a D-dimer elevated to 0.54, CTA PE study was ordered given the patient's chest discomfort.  If negative, symptoms are likely consistent with GERD.  Patient was administered IV Pepcid in addition to IV Zofran and IV fluid bolus.  Plan at time of signout to reassess the patient following the above interventions and following CTA imaging.  Signout given to Dr. Alvino Chapel at 1500.   Final Clinical Impression(s) / ED Diagnoses Final diagnoses:  Diarrhea,  unspecified type  Chest pain, unspecified type  Near syncope  Dehydration    Rx / DC Orders ED Discharge Orders          Ordered    ondansetron (ZOFRAN-ODT) 8 MG disintegrating tablet  Every 8 hours PRN        05/13/22 1616              Regan Lemming, MD 05/13/22 2213

## 2022-05-26 ENCOUNTER — Institutional Professional Consult (permissible substitution): Payer: 59 | Admitting: Cardiology

## 2022-05-26 ENCOUNTER — Ambulatory Visit (INDEPENDENT_AMBULATORY_CARE_PROVIDER_SITE_OTHER): Payer: 59 | Admitting: Psychiatry

## 2022-05-28 ENCOUNTER — Other Ambulatory Visit (HOSPITAL_BASED_OUTPATIENT_CLINIC_OR_DEPARTMENT_OTHER): Payer: Self-pay

## 2022-06-29 ENCOUNTER — Telehealth (INDEPENDENT_AMBULATORY_CARE_PROVIDER_SITE_OTHER): Payer: 59 | Admitting: Physician Assistant

## 2022-06-29 ENCOUNTER — Encounter: Payer: Self-pay | Admitting: Physician Assistant

## 2022-06-29 DIAGNOSIS — F5105 Insomnia due to other mental disorder: Secondary | ICD-10-CM

## 2022-06-29 DIAGNOSIS — F4321 Adjustment disorder with depressed mood: Secondary | ICD-10-CM

## 2022-06-29 DIAGNOSIS — F411 Generalized anxiety disorder: Secondary | ICD-10-CM | POA: Diagnosis not present

## 2022-06-29 DIAGNOSIS — E559 Vitamin D deficiency, unspecified: Secondary | ICD-10-CM

## 2022-06-29 MED ORDER — BUPROPION HCL ER (XL) 150 MG PO TB24: 150.0000 mg | ORAL_TABLET | Freq: Every day | ORAL | 0 refills | Status: DC

## 2022-06-29 MED ORDER — CLONAZEPAM 0.5 MG PO TABS: 0.5000 mg | ORAL_TABLET | Freq: Two times a day (BID) | ORAL | 2 refills | Status: DC | PRN

## 2022-06-29 NOTE — Progress Notes (Signed)
[facility] Med Check  Patient ID: Brandy [name],  MRN: [id number]  PCP: Martinique, Betty G, MD  Date of Evaluation: 06/29/2022 Time spent:20 minutes  Chief Complaint:  Chief Complaint   Anxiety; Depression; Insomnia; Follow-up    Virtual Visit via Telehealth  I connected with patient by a video enabled telemedicine application with their informed consent, and verified patient privacy and that I am speaking with the correct person using two identifiers.  I am private, in my office and the patient is at home.  I discussed the limitations, risks, security and privacy concerns of performing an evaluation and management service by video and the availability of in person appointments. I also discussed with the patient that there may be a patient responsible charge related to this service. The patient expressed understanding and agreed to proceed.   I discussed the assessment and treatment plan with the patient. The patient was provided an opportunity to ask questions and all were answered. The patient agreed with the plan and demonstrated an understanding of the instructions.   The patient was advised to call back or seek an in-person evaluation if the symptoms worsen or if the condition fails to improve as anticipated.  I provided 20 minutes of non-face-to-face time during this encounter.  HISTORY/CURRENT STATUS: Routine med check.   Feels like her meds are working.  She and her family are still grieving the loss of her niece who died earlier this year at 21 years old from a glioblastoma.  But she feels like she is doing as good as can be expected with the grief.  She did see Rinaldo Cloud, LCSW again several times.  [name] states there are some things that she just has to go through alone.  Patient is able to enjoy things, even though sometimes sad, thinking about her niece.  Energy and motivation are good.  Work is going well.   No extreme sadness, tearfulness, or feelings of  hopelessness.  Sleeps well most of the time.  Takes the mirtazapine sometimes.  She does not like to take it because it makes her really hungry.  Trazodone was tried but it actually caused insomnia.  ADLs and personal hygiene are normal.   Denies any changes in concentration, making decisions, or remembering things.  Appetite has not changed.  Weight is stable.  Not isolating.  Denies suicidal or homicidal thoughts.  Anxiety is still present at times.  She usually takes the Klonopin every evening or else she cannot get her mind to turn off so she can go to sleep.  Very often now but when she feels one coming on she will take the Klonopin and it is helpful.  Patient denies increased energy with decreased need for sleep, increased talkativeness, racing thoughts, impulsivity or risky behaviors, increased spending, increased libido, grandiosity, increased irritability or anger, paranoia, or hallucinations.  Denies dizziness, syncope, seizures, numbness, tingling, tremor, tics, unsteady gait, slurred speech, confusion. Denies muscle or joint pain, stiffness, or dystonia. Denies unexplained weight loss, frequent infections, or sores that heal slowly.  No polyphagia, polydipsia, or polyuria. Denies visual changes or paresthesias.   Individual Medical History/ Review of Systems: Changes? :No        Past medications for mental health diagnoses include: Prozac, Luvox made her 'pass out,' Elavil, Ativan, Xanax, Zoloft 'revved her up,' Seroquel, Lithium for 1 pill, Buspar, Inderal, Cymbalta, NAC, Turmeric, Trazodone caused insomnia  Allergies: Metoclopramide, Penicillins, Erenumab-aooe, Fluvoxamine, Penicillin [name] benzathine, Sumatriptan, and Zoloft [sertraline hcl]  Current Medications:  Current  Outpatient Medications:    Cholecalciferol (VITAMIN D) 2000 units CAPS, Take 2,000 Units by mouth daily., Disp: , Rfl:    diltiazem (CARDIZEM CD) 120 MG 24 hr capsule, Take 120 mg by mouth daily., Disp: , Rfl:     esomeprazole (NEXIUM) 40 MG capsule, TAKE 1 CAPSULE (40 MG TOTAL) BY MOUTH 2 (TWO) TIMES DAILY BEFORE A MEAL., Disp: 180 capsule, Rfl: 1   melatonin 5 MG TABS, Take 5 mg by mouth at bedtime., Disp: , Rfl:    mirtazapine (REMERON) 7.5 MG tablet, Take 7.5 mg by mouth at bedtime as needed., Disp: , Rfl:    ondansetron (ZOFRAN-ODT) 8 MG disintegrating tablet, Take 1 tablet (8 mg total) by mouth every 8 (eight) hours as needed for nausea or vomiting., Disp: 8 tablet, Rfl: 0   Probiotic Product (PROBIOTIC PO), Take 1 capsule by mouth daily., Disp: , Rfl:    Vitamin D, Ergocalciferol, (DRISDOL) 1.25 MG (50000 UNIT) CAPS capsule, Take 1 capsule (50,000 Units total) by mouth every 7 (seven) days., Disp: 12 capsule, Rfl: 1   buPROPion (WELLBUTRIN XL) 150 MG 24 hr tablet, Take 1 tablet (150 mg total) by mouth daily., Disp: 90 tablet, Rfl: 0   clonazePAM (KLONOPIN) 0.5 MG tablet, Take 1 tablet (0.5 mg total) by mouth 2 (two) times daily as needed for anxiety., Disp: 60 tablet, Rfl: 2 Medication Side Effects: weight gain from mirtazapine.  Family Medical/ Social History: Changes?   MENTAL HEALTH EXAM:  There were no vitals taken for this visit.There is no height or weight on file to calculate BMI.  General Appearance: Casual and Well Groomed  Eye Contact:  Good  Speech:  Clear and Coherent and Normal Rate  Volume:  Normal  Mood:  Euthymic  Affect:  Congruent  Thought Process:  Goal Directed and Descriptions of Associations: Circumstantial  Orientation:  Full (Time, Place, and Person)  Thought Content: Logical   Suicidal Thoughts:  No  Homicidal Thoughts:  No  Memory:  WNL  Judgement:  Good  Insight:  Good  Psychomotor Activity:  Normal  Concentration:  Concentration: Good  Recall:  Good  Fund of Knowledge: Good  Language: Good  Assets:  Desire for Improvement  ADL's:  Intact  Cognition: WNL  Prognosis:  Good   DIAGNOSES:    ICD-10-CM   1. Generalized anxiety disorder  F41.1     2.  Situational depression  F43.21     3. Grief  F43.21     4. Insomnia due to other mental disorder  F51.05    F99     5. Hypovitaminosis D  E55.9       Receiving Psychotherapy: No   Rinaldo Cloud, LCSW in the past.   RECOMMENDATIONS:  PDMP was reviewed.  Last Klonopin filled 04/27/2022. I provided 20 minutes of non-face-to-face time during this encounter, including time spent before and after the visit in records review, medical decision making, counseling pertinent to today's visit, and charting.   She is doing well with current medications so no changes will be made. Continue Wellbutrin XL 150 mg, 1 p.o. every morning. Continue Klonopin 0.5 mg, 1 p.o. twice daily as needed. Continue vitamin D 50,000 IUs weekly. Return in 3 months.  Donnal Moat, PA-C

## 2022-07-13 ENCOUNTER — Encounter (HOSPITAL_BASED_OUTPATIENT_CLINIC_OR_DEPARTMENT_OTHER): Payer: Self-pay | Admitting: Emergency Medicine

## 2022-07-13 ENCOUNTER — Emergency Department (HOSPITAL_BASED_OUTPATIENT_CLINIC_OR_DEPARTMENT_OTHER): Payer: 59

## 2022-07-13 ENCOUNTER — Emergency Department (HOSPITAL_BASED_OUTPATIENT_CLINIC_OR_DEPARTMENT_OTHER)
Admission: EM | Admit: 2022-07-13 | Discharge: 2022-07-13 | Disposition: A | Discharge status: Home / Self Care | Payer: 59 | Attending: Emergency Medicine | Admitting: Emergency Medicine

## 2022-07-13 ENCOUNTER — Other Ambulatory Visit: Payer: Self-pay

## 2022-07-13 DIAGNOSIS — Z1152 Encounter for screening for COVID-19: Secondary | ICD-10-CM | POA: Insufficient documentation

## 2022-07-13 DIAGNOSIS — R519 Headache, unspecified: Secondary | ICD-10-CM | POA: Insufficient documentation

## 2022-07-13 LAB — CBC
HCT: 40.1 % (ref 36.0–46.0)
Hemoglobin: 12.9 g/dL (ref 12.0–15.0)
MCH: 27.8 pg (ref 26.0–34.0)
MCHC: 32.2 g/dL (ref 30.0–36.0)
MCV: 86.4 fL (ref 80.0–100.0)
Platelets: 442 10*3/uL — ABNORMAL HIGH (ref 150–400)
RBC: 4.64 MIL/uL (ref 3.87–5.11)
RDW: 13.5 % (ref 11.5–15.5)
WBC: 10.6 10*3/uL — ABNORMAL HIGH (ref 4.0–10.5)
nRBC: 0 % (ref 0.0–0.2)

## 2022-07-13 LAB — BASIC METABOLIC PANEL
Anion gap: 7 (ref 5–15)
BUN: 17 mg/dL (ref 6–20)
CO2: 26 mmol/L (ref 22–32)
Calcium: 8.7 mg/dL — ABNORMAL LOW (ref 8.9–10.3)
Chloride: 108 mmol/L (ref 98–111)
Creatinine, Ser: 0.98 mg/dL (ref 0.44–1.00)
GFR, Estimated: >60 mL/min (ref >60)
Glucose, Bld: 87 mg/dL (ref 70–99)
Potassium: 4 mmol/L (ref 3.5–5.1)
Sodium: 141 mmol/L (ref 135–145)

## 2022-07-13 LAB — URINALYSIS, MICROSCOPIC (REFLEX): WBC, UA: NONE SEEN WBC/hpf (ref 0–5)

## 2022-07-13 LAB — URINALYSIS, ROUTINE W REFLEX MICROSCOPIC
Bilirubin Urine: NEGATIVE
Glucose, UA: NEGATIVE mg/dL
Ketones, ur: NEGATIVE mg/dL
Leukocytes,Ua: NEGATIVE
Nitrite: NEGATIVE
Protein, ur: NEGATIVE mg/dL
Specific Gravity, Urine: >=1.03 (ref 1.005–1.030)
pH: 5.5 (ref 5.0–8.0)

## 2022-07-13 LAB — RESP PANEL BY RT-PCR (FLU A&B, COVID) ARPGX2
Influenza A by PCR: NEGATIVE
Influenza B by PCR: NEGATIVE
SARS Coronavirus 2 by RT PCR: NEGATIVE

## 2022-07-13 LAB — PREGNANCY, URINE: Preg Test, Ur: NEGATIVE

## 2022-07-13 MED ORDER — IBUPROFEN 600 MG PO TABS: 600.0000 mg | ORAL_TABLET | Freq: Four times a day (QID) | ORAL | 0 refills | Status: AC | PRN

## 2022-07-13 MED ORDER — ONDANSETRON 4 MG PO TBDP: 4.0000 mg | ORAL_TABLET | Freq: Three times a day (TID) | ORAL | 0 refills | Status: DC | PRN

## 2022-07-13 MED ORDER — ONDANSETRON HCL 4 MG/2ML IJ SOLN
4.0000 mg | Freq: Once | INTRAMUSCULAR | Status: AC
  Administered 2022-07-13: 4 mg via INTRAVENOUS
  Filled 2022-07-13: qty 2

## 2022-07-13 MED ORDER — KETOROLAC TROMETHAMINE 15 MG/ML IJ SOLN
15.0000 mg | Freq: Once | INTRAMUSCULAR | Status: AC
  Administered 2022-07-13: 15 mg via INTRAVENOUS
  Filled 2022-07-13: qty 1

## 2022-07-13 MED ORDER — MORPHINE SULFATE (PF) 2 MG/ML IV SOLN
2.0000 mg | Freq: Once | INTRAVENOUS | Status: AC
  Administered 2022-07-13: 2 mg via INTRAVENOUS
  Filled 2022-07-13: qty 1

## 2022-07-13 MED ORDER — SODIUM CHLORIDE 0.9 % IV BOLUS
1000.0000 mL | Freq: Once | INTRAVENOUS | Status: AC
  Administered 2022-07-13: 1000 mL via INTRAVENOUS

## 2022-07-13 MED ORDER — SODIUM CHLORIDE 0.9 % IV SOLN: INTRAVENOUS | Status: DC

## 2022-07-13 MED ORDER — HYDROCODONE-ACETAMINOPHEN 5-325 MG PO TABS: 1.0000 | ORAL_TABLET | ORAL | 0 refills | Status: DC | PRN

## 2022-07-13 NOTE — ED Triage Notes (Signed)
Pt reports severe right sided head pain since Thursday. Pain has progressively worsened. Describes pain as pressure. Reports when pain first started, patient has an episode of aphasia that lasted roughly one minute. Aphasia resolved. Pain unrelieved with otc meds. Pt denies hx of migraines, blood thinners, head trauma, vision changes, weakness, balance issues.

## 2022-07-13 NOTE — ED Provider Notes (Signed)
[facility]ERGENC[facility] DEPARTMENT Provider Note   CSN: [id number] Arrival date & time: 07/13/22  1601     History  Chief Complaint  Patient presents with   Headache    Brandy [name] is a 36 y.o. female.  Pt is a 36 yo female with a pmhx significant for migraines, anxiety, GERD, dub, anxiety, and depression.  Pt said she was in the back seat of a car going out to dinner when she developed a severe headache.  She said it started 5 days ago.  She said she could not speak for about a minute when the pain first started.  No other neurologic sx.  She denies hx migraines.  She denies any trouble walking, talking, moving, or seeing.         Home Medications Prior to Admission medications   Medication Sig Start Date End Date Taking? Authorizing Provider  HYDROcodone-acetaminophen (NORCO/VICODIN) 5-325 MG tablet Take 1 tablet by mouth every 4 (four) hours as needed. 07/13/22  Yes Isla Pence, MD  ibuprofen (ADVIL) 600 MG tablet Take 1 tablet (600 mg total) by mouth every 6 (six) hours as needed. 07/13/22  Yes Isla Pence, MD  ondansetron (ZOFRAN-ODT) 4 MG disintegrating tablet Take 1 tablet (4 mg total) by mouth every 8 (eight) hours as needed for nausea or vomiting. 07/13/22  Yes Isla Pence, MD  buPROPion (WELLBUTRIN XL) 150 MG 24 hr tablet Take 1 tablet (150 mg total) by mouth daily. 06/29/22   Addison Lank, PA-C  Cholecalciferol (VITAMIN D) 2000 units CAPS Take 2,000 Units by mouth daily.    [provider]  clonazePAM (KLONOPIN) 0.5 MG tablet Take 1 tablet (0.5 mg total) by mouth 2 (two) times daily as needed for anxiety. 06/29/22   Donnal Moat T, PA-C  diltiazem (CARDIZEM CD) 120 MG 24 hr capsule Take 120 mg by mouth daily. 04/12/22   [provider]  esomeprazole (NEXIUM) 40 MG capsule TAKE 1 CAPSULE (40 MG TOTAL) BY MOUTH 2 (TWO) TIMES DAILY BEFORE A MEAL. 04/27/22   Noralyn Pick, NP  melatonin 5 MG TABS Take 5 mg by mouth at  bedtime.    [provider]  mirtazapine (REMERON) 7.5 MG tablet Take 7.5 mg by mouth at bedtime as needed. 03/30/22   [provider]  Probiotic Product (PROBIOTIC PO) Take 1 capsule by mouth daily.    [provider]  Vitamin D, Ergocalciferol, (DRISDOL) 1.25 MG (50000 UNIT) CAPS capsule Take 1 capsule (50,000 Units total) by mouth every 7 (seven) days. 02/05/22   Donnal Moat T, PA-C  DULoxetine (CYMBALTA) 20 MG capsule Take 1 capsule (20 mg total) by mouth daily. Patient not taking: Reported on 03/20/2019 01/11/19 06/21/19  Donnal Moat T, PA-C      Allergies    Metoclopramide, Penicillins, Erenumab-aooe, Fluvoxamine, Penicillin g benzathine, Sumatriptan, and Zoloft [sertraline hcl]    Review of Systems   Review of Systems  Gastrointestinal:  Positive for nausea.  Neurological:  Positive for headaches.  All other systems reviewed and are negative.   Physical Exam Updated Vital Signs BP 108/68 (BP Location: Left Arm)   Pulse 85   Temp 97.7 F (36.5 C)   Resp 16   Ht 5' 8.5" (1.74 m)   Wt 113.4 kg   LMP 07/09/2022 (Exact Date)   SpO2 97%   BMI 37.46 kg/m  Physical Exam Vitals and nursing note reviewed.  Constitutional:      Appearance: She is well-developed.  HENT:  Head: Normocephalic and atraumatic.     Mouth/Throat:     Mouth: Mucous membranes are moist.     Pharynx: Oropharynx is clear.  Eyes:     Extraocular Movements: Extraocular movements intact.     Pupils: Pupils are equal, round, and reactive to light.  Cardiovascular:     Rate and Rhythm: Normal rate and regular rhythm.     Heart sounds: Normal heart sounds.  Pulmonary:     Effort: Pulmonary effort is normal.     Breath sounds: Normal breath sounds.  Abdominal:     General: Bowel sounds are normal.     Palpations: Abdomen is soft.  Musculoskeletal:        General: Normal range of motion.     Cervical back: Normal range of motion and neck supple.  Skin:    General: Skin is  warm.  Neurological:     Mental Status: She is alert and oriented to person, place, and time.  Psychiatric:        Mood and Affect: Mood normal.        Speech: Speech normal.        Behavior: Behavior normal.     ED Results / Procedures / Treatments   Labs (all labs ordered are listed, but only abnormal results are displayed) Labs Reviewed  CBC - Abnormal; Notable for the following components:      Result Value   WBC 10.6 (*)    Platelets 442 (*)    All other components within normal limits  BASIC METABOLIC PANEL - Abnormal; Notable for the following components:   Calcium 8.7 (*)    All other components within normal limits  URINALYSIS, ROUTINE W REFLEX MICROSCOPIC - Abnormal; Notable for the following components:   APPearance HAZY (*)    Hgb urine dipstick TRACE (*)    All other components within normal limits  URINALYSIS, MICROSCOPIC (REFLEX) - Abnormal; Notable for the following components:   Bacteria, UA RARE (*)    All other components within normal limits  RESP PANEL BY RT-PCR (FLU A&B, COVID) ARPGX2  PREGNANCY, URINE    EKG None  Radiology CT Head Wo Contrast  Result Date: 07/13/2022 CLINICAL DATA:  Headache. EXAM: CT HEAD WITHOUT CONTRAST TECHNIQUE: Contiguous axial images were obtained from the base of the skull through the vertex without intravenous contrast. RADIATION DOSE REDUCTION: This exam was performed according to the departmental dose-optimization program which includes automated exposure control, adjustment of the mA and/or kV according to patient size and/or use of iterative reconstruction technique. COMPARISON:  MRI brain dated August 26, 2020. CT head dated March 07, 2018. FINDINGS: Brain: No evidence of acute infarction, hemorrhage, hydrocephalus, extra-axial collection or mass lesion/mass effect. Vascular: No hyperdense vessel or unexpected calcification. Skull: Normal. Negative for fracture or focal lesion. Sinuses/Orbits: No acute finding. Other:  None. IMPRESSION: 1. No acute intracranial abnormality. Electronically Signed   By: Titus Dubin M.D.   On: 07/13/2022 16:43    Procedures Procedures    Medications Ordered in ED Medications  sodium chloride 0.9 % bolus 1,000 mL (0 mLs Intravenous Stopped 07/13/22 1826)    And  0.9 %  sodium chloride infusion ( Intravenous New Bag/Given 07/13/22 1839)  ondansetron (ZOFRAN) injection 4 mg (4 mg Intravenous Given 07/13/22 1722)  ketorolac (TORADOL) 15 MG/ML injection 15 mg (15 mg Intravenous Given 07/13/22 1722)  sodium chloride 0.9 % bolus 1,000 mL (1,000 mLs Intravenous New Bag/Given 07/13/22 1844)  morphine (PF) 2 MG/ML injection 2  mg (2 mg Intravenous Given 07/13/22 1840)    ED Course/ Medical Decision Making/ A&P                           Medical Decision Making Amount and/or Complexity of Data Reviewed Labs: ordered. Radiology: ordered.  Risk Prescription drug management.   This patient presents to the ED for concern of headache, this involves an extensive number of treatment options, and is a complaint that carries with it a high risk of complications and morbidity.  The differential diagnosis includes migraine, headache, uri   Co morbidities that complicate the patient evaluation   migraines, anxiety, GERD, dub, anxiety, and depression   Additional history obtained:  Additional history obtained from epic chart review External records from outside source obtained and reviewed including husband   Lab Tests:  I Ordered, and personally interpreted labs.  The pertinent results include:  cbc nl, bmp nl, ua nl, preg neg; covid/flu neg   Imaging Studies ordered:  I ordered imaging studies including ct head  I independently visualized and interpreted imaging which showed . No acute intracranial abnormality.  I agree with the radiologist interpretation   Cardiac Monitoring:  The patient was maintained on a cardiac monitor.  I personally viewed and interpreted  the cardiac monitored which showed an underlying rhythm of: nsr   Medicines ordered and prescription drug management:  I ordered medication including ivfs/toradol/zofran  for headache  Reevaluation of the patient after these medicines showed that the patient improved I have reviewed the patients home medicines and have made adjustments as needed    Problem List / ED Course:  Headache:  pt is feeling much better after fluids and meds.  CT neg.  Labs neg.  Pt is stable for d/c.  Return if worse.  F/u with pcp.   Reevaluation:  After the interventions noted above, I reevaluated the patient and found that they have :improved   Social Determinants of Health:  Lives at home   Dispostion:  After consideration of the diagnostic results and the patients response to treatment, I feel that the patent would benefit from discharge with outpatient f/u.          Final Clinical Impression(s) / ED Diagnoses Final diagnoses:  Acute nonintractable headache, unspecified headache type    Rx / DC Orders ED Discharge Orders          Ordered    ondansetron (ZOFRAN-ODT) 4 MG disintegrating tablet  Every 8 hours PRN        07/13/22 1934    HYDROcodone-acetaminophen (NORCO/VICODIN) 5-325 MG tablet  Every 4 hours PRN        07/13/22 1934    ibuprofen (ADVIL) 600 MG tablet  Every 6 hours PRN        07/13/22 1934              Isla Pence, MD 07/13/22 1936

## 2022-07-13 NOTE — ED Notes (Signed)
Patient ambulated to restroom at this time without difficulty. Reports her pain has minimally improved.

## 2022-07-14 ENCOUNTER — Telehealth: Payer: Self-pay

## 2022-07-14 NOTE — Telephone Encounter (Signed)
Transition Care Management Unsuccessful Follow-up Telephone Call  Date of discharge and from where:  07/13/22 from [facility] for Headache, unspecified  Attempts:  1st Attempt  Reason for unsuccessful TCM follow-up call:  Left voice message

## 2022-07-15 NOTE — Telephone Encounter (Signed)
Transition Care Management Unsuccessful Follow-up Telephone Call  Date of discharge and from where:    07/13/22 from [facility] for Headache, unspecified   Attempts:  2nd Attempt  Reason for unsuccessful TCM follow-up call:  Left voice message

## 2022-07-22 ENCOUNTER — Other Ambulatory Visit: Payer: Self-pay

## 2022-07-22 ENCOUNTER — Emergency Department (HOSPITAL_BASED_OUTPATIENT_CLINIC_OR_DEPARTMENT_OTHER)
Admission: EM | Admit: 2022-07-22 | Discharge: 2022-07-22 | Disposition: A | Discharge status: Home / Self Care | Payer: Managed Care, Other (non HMO) | Attending: Emergency Medicine | Admitting: Emergency Medicine

## 2022-07-22 ENCOUNTER — Encounter (HOSPITAL_BASED_OUTPATIENT_CLINIC_OR_DEPARTMENT_OTHER): Payer: Self-pay | Admitting: Emergency Medicine

## 2022-07-22 ENCOUNTER — Telehealth: Payer: Self-pay

## 2022-07-22 DIAGNOSIS — R519 Headache, unspecified: Secondary | ICD-10-CM | POA: Diagnosis present

## 2022-07-22 DIAGNOSIS — R42 Dizziness and giddiness: Secondary | ICD-10-CM | POA: Insufficient documentation

## 2022-07-22 LAB — CBC WITH DIFFERENTIAL/PLATELET
Abs Immature Granulocytes: 0.04 10*3/uL (ref 0.00–0.07)
Basophils Absolute: 0 10*3/uL (ref 0.0–0.1)
Basophils Relative: 1 %
Eosinophils Absolute: 0.1 10*3/uL (ref 0.0–0.5)
Eosinophils Relative: 1 %
HCT: 42.6 % (ref 36.0–46.0)
Hemoglobin: 13.7 g/dL (ref 12.0–15.0)
Immature Granulocytes: 1 %
Lymphocytes Relative: 23 %
Lymphs Abs: 1.8 10*3/uL (ref 0.7–4.0)
MCH: 27.7 pg (ref 26.0–34.0)
MCHC: 32.2 g/dL (ref 30.0–36.0)
MCV: 86.2 fL (ref 80.0–100.0)
Monocytes Absolute: 0.4 10*3/uL (ref 0.1–1.0)
Monocytes Relative: 5 %
Neutro Abs: 5.4 10*3/uL (ref 1.7–7.7)
Neutrophils Relative %: 69 %
Platelets: 454 10*3/uL — ABNORMAL HIGH (ref 150–400)
RBC: 4.94 MIL/uL (ref 3.87–5.11)
RDW: 13.2 % (ref 11.5–15.5)
WBC: 7.8 10*3/uL (ref 4.0–10.5)
nRBC: 0 % (ref 0.0–0.2)

## 2022-07-22 LAB — COMPREHENSIVE METABOLIC PANEL
ALT: 25 U/L (ref 0–44)
AST: 17 U/L (ref 15–41)
Albumin: 4 g/dL (ref 3.5–5.0)
Alkaline Phosphatase: 68 U/L (ref 38–126)
Anion gap: 9 (ref 5–15)
BUN: 10 mg/dL (ref 6–20)
CO2: 27 mmol/L (ref 22–32)
Calcium: 9.3 mg/dL (ref 8.9–10.3)
Chloride: 102 mmol/L (ref 98–111)
Creatinine, Ser: 0.87 mg/dL (ref 0.44–1.00)
GFR, Estimated: >60 mL/min (ref >60)
Glucose, Bld: 162 mg/dL — ABNORMAL HIGH (ref 70–99)
Potassium: 4.1 mmol/L (ref 3.5–5.1)
Sodium: 138 mmol/L (ref 135–145)
Total Bilirubin: 0.5 mg/dL (ref 0.3–1.2)
Total Protein: 8 g/dL (ref 6.5–8.1)

## 2022-07-22 LAB — PREGNANCY, URINE: Preg Test, Ur: NEGATIVE

## 2022-07-22 MED ORDER — DEXAMETHASONE SODIUM PHOSPHATE 10 MG/ML IJ SOLN
10.0000 mg | Freq: Once | INTRAMUSCULAR | Status: AC
  Administered 2022-07-22: 10 mg via INTRAVENOUS
  Filled 2022-07-22: qty 1

## 2022-07-22 MED ORDER — SODIUM CHLORIDE 0.9 % IV SOLN: INTRAVENOUS | Status: DC

## 2022-07-22 MED ORDER — SODIUM CHLORIDE 0.9 % IV BOLUS
1000.0000 mL | Freq: Once | INTRAVENOUS | Status: AC
  Administered 2022-07-22: 1000 mL via INTRAVENOUS

## 2022-07-22 MED ORDER — KETOROLAC TROMETHAMINE 15 MG/ML IJ SOLN
15.0000 mg | Freq: Once | INTRAMUSCULAR | Status: AC
  Administered 2022-07-22: 15 mg via INTRAVENOUS
  Filled 2022-07-22: qty 1

## 2022-07-22 MED ORDER — PROCHLORPERAZINE EDISYLATE 10 MG/2ML IJ SOLN
10.0000 mg | Freq: Once | INTRAMUSCULAR | Status: AC
  Administered 2022-07-22: 10 mg via INTRAVENOUS
  Filled 2022-07-22: qty 2

## 2022-07-22 NOTE — Telephone Encounter (Signed)
Transition Care Management Unsuccessful Follow-up Telephone Call  Date of discharge and from where:  07/22/22 from [facility] for Headache, unspecified  Attempts:  1st Attempt  Reason for unsuccessful TCM follow-up call:  Left voice message

## 2022-07-22 NOTE — ED Notes (Signed)
Pt ambulated to bathroom to get urine sample

## 2022-07-22 NOTE — ED Provider Notes (Signed)
[facility] EMERGENCY DEPARTMENT Provider Note   CSN: [id number] Arrival date & time: 07/22/22  1018     History  Chief Complaint  Patient presents with   Headache   HPI Brandy [name] is a 36 y.o. female with past medical history of migraines, depression, anxiety, and B12 deficiency presenting for headache.  States that headache started 2 weeks ago.  Headache was very mild with some nausea at first but has progressively worsened over the last couple weeks.  Originally located in both temples but now has radiated to the base of her head.  Also endorses dizziness and loss of balance which started couple days ago.  Denies room spinning sensation. Endorses visual disturbance. States that it has been "hard to walk in a straight line".  States that lying flat makes her headache better and standing up straight makes it worse.  Denies fever. States she was seen on the 13th of this month for the same complaint.  States that CT scan of her head was "normal".  Endorses photophobia and phonophobia.   Headache      Home Medications Prior to Admission medications   Medication Sig Start Date End Date Taking? Authorizing Provider  buPROPion (WELLBUTRIN XL) 150 MG 24 hr tablet Take 1 tablet (150 mg total) by mouth daily. 06/29/22   Addison Lank, PA-C  Cholecalciferol (VITAMIN D) 2000 units CAPS Take 2,000 Units by mouth daily.    [provider]  clonazePAM (KLONOPIN) 0.5 MG tablet Take 1 tablet (0.5 mg total) by mouth 2 (two) times daily as needed for anxiety. 06/29/22   Donnal Moat T, PA-C  diltiazem (CARDIZEM CD) 120 MG 24 hr capsule Take 120 mg by mouth daily. 04/12/22   [provider]  esomeprazole (NEXIUM) 40 MG capsule TAKE 1 CAPSULE (40 MG TOTAL) BY MOUTH 2 (TWO) TIMES DAILY BEFORE A MEAL. 04/27/22   Noralyn Pick, NP  HYDROcodone-acetaminophen (NORCO/VICODIN) 5-325 MG tablet Take 1 tablet by mouth every 4 (four) hours as needed. 07/13/22   Isla Pence, MD  ibuprofen (ADVIL) 600 MG tablet Take 1 tablet (600 mg total) by mouth every 6 (six) hours as needed. 07/13/22   Isla Pence, MD  melatonin 5 MG TABS Take 5 mg by mouth at bedtime.    [provider]  mirtazapine (REMERON) 7.5 MG tablet Take 7.5 mg by mouth at bedtime as needed. 03/30/22   [provider]  ondansetron (ZOFRAN-ODT) 4 MG disintegrating tablet Take 1 tablet (4 mg total) by mouth every 8 (eight) hours as needed for nausea or vomiting. 07/13/22   Isla Pence, MD  Probiotic Product (PROBIOTIC PO) Take 1 capsule by mouth daily.    [provider]  Vitamin D, Ergocalciferol, (DRISDOL) 1.25 MG (50000 UNIT) CAPS capsule Take 1 capsule (50,000 Units total) by mouth every 7 (seven) days. 02/05/22   Donnal Moat T, PA-C  DULoxetine (CYMBALTA) 20 MG capsule Take 1 capsule (20 mg total) by mouth daily. Patient not taking: Reported on 03/20/2019 01/11/19 06/21/19  Donnal Moat T, PA-C      Allergies    Metoclopramide, Penicillins, Erenumab-aooe, Fluvoxamine, Penicillin g benzathine, Sumatriptan, and Zoloft [sertraline hcl]    Review of Systems   Review of Systems  Neurological:  Positive for headaches.    Physical Exam Updated Vital Signs BP (!) 140/86   Pulse (!) 108   Temp 98.2 F (36.8 C) (Oral)   Resp 20   LMP 07/09/2022 (Exact Date)   SpO2 98%  Physical Exam Vitals and nursing note reviewed.  HENT:     Head: Normocephalic and atraumatic.     Mouth/Throat:     Mouth: Mucous membranes are moist.  Eyes:     General:        Right eye: No discharge.        Left eye: No discharge.     Conjunctiva/sclera: Conjunctivae normal.  Cardiovascular:     Rate and Rhythm: Normal rate and regular rhythm.     Pulses: Normal pulses.     Heart sounds: Normal heart sounds.  Pulmonary:     Effort: Pulmonary effort is normal.     Breath sounds: Normal breath sounds.  Abdominal:     General: Abdomen is flat.     Palpations: Abdomen is soft.   Skin:    General: Skin is warm and dry.  Neurological:     General: No focal deficit present.     Comments: GCS 15. Speech is goal oriented. No deficits appreciated to CN III-XII; symmetric eyebrow raise, no facial drooping, tongue midline. Patient has equal grip strength bilaterally with 5/5 strength against resistance in all major muscle groups bilaterally. Sensation to light touch intact. Patient moves extremities without ataxia. Normal finger-nose-finger. Patient ambulatory with steady gait.   Psychiatric:        Mood and Affect: Mood normal.       ED Results / Procedures / Treatments   Labs (all labs ordered are listed, but only abnormal results are displayed) Labs Reviewed  COMPREHENSIVE METABOLIC PANEL - Abnormal; Notable for the following components:      Result Value   Glucose, Bld 162 (*)    All other components within normal limits  CBC WITH DIFFERENTIAL/PLATELET - Abnormal; Notable for the following components:   Platelets 454 (*)    All other components within normal limits  PREGNANCY, URINE    EKG EKG Interpretation  Date/Time:  Wednesday July 22 2022 10:35:29 EST Ventricular Rate:  104 PR Interval:  137 QRS Duration: 84 QT Interval:  336 QTC Calculation: 442 R Axis:   13 Text Interpretation: Sinus tachycardia Low voltage, precordial leads Borderline T wave abnormalities No significant change since last tracing Confirmed by Deno Etienne [phone]) on 07/22/2022 11:34:57 AM  Radiology No results found.  Procedures Procedures    Medications Ordered in ED Medications  sodium chloride 0.9 % bolus 1,000 mL (0 mLs Intravenous Stopped 07/22/22 1204)    And  0.9 %  sodium chloride infusion (has no administration in time range)  ketorolac (TORADOL) 15 MG/ML injection 15 mg (15 mg Intravenous Given 07/22/22 1105)  prochlorperazine (COMPAZINE) injection 10 mg (10 mg Intravenous Given 07/22/22 1105)  dexamethasone (DECADRON) injection 10 mg (10 mg Intravenous  Given 07/22/22 1105)    ED Course/ Medical Decision Making/ A&P                           Medical Decision Making Amount and/or Complexity of Data Reviewed Labs: ordered.  Risk Prescription drug management.   This patient presents to the ED for concern of headache, this involves a number of treatment options, and is a complaint that carries with it a high risk of complications and morbidity.  The differential diagnosis includes migraine, meningitis, CVA, and ICH.   Co morbidities: Discussed in HPI     EMR reviewed including pt PMHx, past surgical history and past visits to ER. I interpreted and reviewed head CT on 07/13/22 which  did not acute intracranial abnormality.  See HPI for more details   Lab Tests:   I viewed and independently interpreted labs. Labs notable for hyperglycemia.   Imaging Studies:  No imaging studies ordered for this patient    Cardiac Monitoring:  The patient was maintained on a cardiac monitor.  I personally viewed and interpreted the cardiac monitored which showed an underlying rhythm of: sinus tachycardia EKG non-ischemic   Medicines ordered:  I ordered medication including NS bolus for volume resuscitation, Decadron, toradol and compazine for headache Reevaluation of the patient after these medicines showed that the patient improved I have reviewed the patients home medicines and have made adjustments as needed    Consults/Attending Physician   I discussed this case with my attending physician who cosigned this note including patient's presenting symptoms, physical exam, and planned diagnostics and interventions. Attending physician stated agreement with plan or made changes to plan which were implemented.   Reevaluation:  After the interventions noted above I re-evaluated patient and found that they have :improved   Problem List / ED Course: Patient presented for headache.  Physical exam was reassuring without FND. Reviewed  recent head CT which was negative for intracranial pathology 9 days ago. Decided not to scan again since exam was overall normal. Treated headache and patient stated she felt much better. She also stated visual disturbance and dizziness were improved.Tachycardia improved with volume bolus. Discussed follow up with neurology and she expressed desire for referral but also state she would follow up with PCP first. Discussed return precautions. Tachycardia improved with volume bolus.    Dispostion:  After consideration of the diagnostic results and the patients response to treatment, I feel that the patient would benefit from discharge and f/u with PCP and/or neuro for persistent headache with visual disturbance and dizziness.          Final Clinical Impression(s) / ED Diagnoses Final diagnoses:  Nonintractable headache, unspecified chronicity pattern, unspecified headache type    Rx / DC Orders ED Discharge Orders     None         Harriet Pho, PA-C 07/22/22 [place], DO 07/22/22 1454

## 2022-07-22 NOTE — Discharge Instructions (Addendum)
Patient headache is overall reassuring.  Recommend that you follow-up with [facility] neurology for persistent severe headaches with associated visual disturbance and dizziness.  Also recommend follow-up with PCP.  If you have new facial droop, slurred speech, weakness or numbness in your extremities, changes in your gait please return to emergency department for further evaluation.

## 2022-07-22 NOTE — ED Triage Notes (Signed)
Pt reports intermittent headache for the past couple of weeks. Was seen here for same a week ago. Pt reports HA is on back of head and worsened with sitting up. Pt reports now she has lightheadedness and weird taste in mouth. Pt ambulatory to room. Also reports pain between shoulder blades

## 2022-08-04 ENCOUNTER — Emergency Department (HOSPITAL_BASED_OUTPATIENT_CLINIC_OR_DEPARTMENT_OTHER): Payer: Managed Care, Other (non HMO)

## 2022-08-04 ENCOUNTER — Telehealth: Payer: Self-pay | Admitting: Family Medicine

## 2022-08-04 ENCOUNTER — Encounter (HOSPITAL_BASED_OUTPATIENT_CLINIC_OR_DEPARTMENT_OTHER): Payer: Self-pay

## 2022-08-04 ENCOUNTER — Emergency Department (HOSPITAL_BASED_OUTPATIENT_CLINIC_OR_DEPARTMENT_OTHER)
Admission: EM | Admit: 2022-08-04 | Discharge: 2022-08-04 | Disposition: A | Discharge status: Home / Self Care | Payer: Managed Care, Other (non HMO) | Attending: Emergency Medicine | Admitting: Emergency Medicine

## 2022-08-04 ENCOUNTER — Other Ambulatory Visit: Payer: Self-pay

## 2022-08-04 DIAGNOSIS — R519 Headache, unspecified: Secondary | ICD-10-CM | POA: Insufficient documentation

## 2022-08-04 DIAGNOSIS — K529 Noninfective gastroenteritis and colitis, unspecified: Secondary | ICD-10-CM | POA: Diagnosis not present

## 2022-08-04 DIAGNOSIS — G4489 Other headache syndrome: Secondary | ICD-10-CM

## 2022-08-04 DIAGNOSIS — Z1152 Encounter for screening for COVID-19: Secondary | ICD-10-CM | POA: Diagnosis not present

## 2022-08-04 LAB — CBC WITH DIFFERENTIAL/PLATELET
Abs Immature Granulocytes: 0.04 10*3/uL (ref 0.00–0.07)
Basophils Absolute: 0 10*3/uL (ref 0.0–0.1)
Basophils Relative: 0 %
Eosinophils Absolute: 0 10*3/uL (ref 0.0–0.5)
Eosinophils Relative: 0 %
HCT: 39.6 % (ref 36.0–46.0)
Hemoglobin: 12.9 g/dL (ref 12.0–15.0)
Immature Granulocytes: 0 %
Lymphocytes Relative: 15 %
Lymphs Abs: 1.3 10*3/uL (ref 0.7–4.0)
MCH: 27.3 pg (ref 26.0–34.0)
MCHC: 32.6 g/dL (ref 30.0–36.0)
MCV: 83.7 fL (ref 80.0–100.0)
Monocytes Absolute: 0.7 10*3/uL (ref 0.1–1.0)
Monocytes Relative: 8 %
Neutro Abs: 6.9 10*3/uL (ref 1.7–7.7)
Neutrophils Relative %: 77 %
Platelets: 395 10*3/uL (ref 150–400)
RBC: 4.73 MIL/uL (ref 3.87–5.11)
RDW: 13.3 % (ref 11.5–15.5)
WBC: 9 10*3/uL (ref 4.0–10.5)
nRBC: 0 % (ref 0.0–0.2)

## 2022-08-04 LAB — RESP PANEL BY RT-PCR (FLU A&B, COVID) ARPGX2
Influenza A by PCR: NEGATIVE
Influenza B by PCR: NEGATIVE
SARS Coronavirus 2 by RT PCR: NEGATIVE

## 2022-08-04 LAB — BASIC METABOLIC PANEL
Anion gap: 10 (ref 5–15)
BUN: 7 mg/dL (ref 6–20)
CO2: 22 mmol/L (ref 22–32)
Calcium: 9.1 mg/dL (ref 8.9–10.3)
Chloride: 105 mmol/L (ref 98–111)
Creatinine, Ser: 0.95 mg/dL (ref 0.44–1.00)
GFR, Estimated: >60 mL/min (ref >60)
Glucose, Bld: 130 mg/dL — ABNORMAL HIGH (ref 70–99)
Potassium: 3.1 mmol/L — ABNORMAL LOW (ref 3.5–5.1)
Sodium: 137 mmol/L (ref 135–145)

## 2022-08-04 LAB — LIPASE, BLOOD: Lipase: 36 U/L (ref 11–51)

## 2022-08-04 LAB — GLUCOSE, CAPILLARY: Glucose-Capillary: 111 mg/dL — ABNORMAL HIGH (ref 70–99)

## 2022-08-04 MED ORDER — PROMETHAZINE HCL 25 MG PO TABS: 25.0000 mg | ORAL_TABLET | Freq: Four times a day (QID) | ORAL | 0 refills | Status: DC | PRN

## 2022-08-04 MED ORDER — SODIUM CHLORIDE 0.9 % IV SOLN
25.0000 mg | Freq: Once | INTRAVENOUS | Status: AC
  Administered 2022-08-04: 25 mg via INTRAVENOUS
  Filled 2022-08-04: qty 1

## 2022-08-04 MED ORDER — ONDANSETRON 4 MG PO TBDP: 4.0000 mg | ORAL_TABLET | Freq: Three times a day (TID) | ORAL | 0 refills | Status: AC | PRN

## 2022-08-04 MED ORDER — LACTATED RINGERS IV BOLUS
1000.0000 mL | Freq: Once | INTRAVENOUS | Status: AC
  Administered 2022-08-04: 1000 mL via INTRAVENOUS

## 2022-08-04 MED ORDER — POTASSIUM CHLORIDE CRYS ER 20 MEQ PO TBCR
40.0000 meq | EXTENDED_RELEASE_TABLET | Freq: Once | ORAL | Status: AC
  Administered 2022-08-04: 40 meq via ORAL
  Filled 2022-08-04: qty 2

## 2022-08-04 MED ORDER — KETOROLAC TROMETHAMINE 15 MG/ML IJ SOLN
15.0000 mg | Freq: Once | INTRAMUSCULAR | Status: AC
  Administered 2022-08-04: 15 mg via INTRAVENOUS
  Filled 2022-08-04: qty 1

## 2022-08-04 NOTE — ED Notes (Signed)
HR 125, SpO2 100% @ registration window, patient taken to triage

## 2022-08-04 NOTE — Telephone Encounter (Signed)
Referral placed.

## 2022-08-04 NOTE — ED Provider Notes (Signed)
[facility] EMERGENCY DEPARTMENT Provider Note   CSN: [id number] Arrival date & time: 08/04/22  1721     History  Chief Complaint  Patient presents with   Headache   Shortness of Breath    Brandy [name] is a 36 y.o. female.  HPI 36 year old female presents with a chief complaint of headache but also having vomiting and diarrhea.  She has been dealing with a headache for over a month.  She has been in this emergency department a couple x4.  The headache is daily and primarily occipital.  It first started as temporal though now is primarily occipital.  It is better when she lays flat and worse when she sits up.  Over the past 3 days or so she has been having vomiting and diarrhea and low-grade fevers.  No neck stiffness.  No weakness or numbness in her extremities.  She feels like she is in a "fog" and having memory issues.  She denies any visual complaints. Felt like she was going to pass out and is still having diarrhea.  Home Medications Prior to Admission medications   Medication Sig Start Date End Date Taking? Authorizing Provider  ondansetron (ZOFRAN-ODT) 4 MG disintegrating tablet Take 1 tablet (4 mg total) by mouth every 8 (eight) hours as needed for nausea or vomiting. 08/04/22  Yes Sherwood Gambler, MD  promethazine (PHENERGAN) 25 MG tablet Take 1 tablet (25 mg total) by mouth every 6 (six) hours as needed for nausea or vomiting. 08/04/22  Yes Sherwood Gambler, MD  buPROPion (WELLBUTRIN XL) 150 MG 24 hr tablet Take 1 tablet (150 mg total) by mouth daily. 06/29/22   Addison Lank, PA-C  Cholecalciferol (VITAMIN D) 2000 units CAPS Take 2,000 Units by mouth daily.    [provider]  clonazePAM (KLONOPIN) 0.5 MG tablet Take 1 tablet (0.5 mg total) by mouth 2 (two) times daily as needed for anxiety. 06/29/22   Donnal Moat T, PA-C  diltiazem (CARDIZEM CD) 120 MG 24 hr capsule Take 120 mg by mouth daily. 04/12/22   [provider]  esomeprazole (NEXIUM) 40  MG capsule TAKE 1 CAPSULE (40 MG TOTAL) BY MOUTH 2 (TWO) TIMES DAILY BEFORE A MEAL. 04/27/22   Noralyn Pick, NP  HYDROcodone-acetaminophen (NORCO/VICODIN) 5-325 MG tablet Take 1 tablet by mouth every 4 (four) hours as needed. 07/13/22   Isla Pence, MD  ibuprofen (ADVIL) 600 MG tablet Take 1 tablet (600 mg total) by mouth every 6 (six) hours as needed. 07/13/22   Isla Pence, MD  melatonin 5 MG TABS Take 5 mg by mouth at bedtime.    [provider]  mirtazapine (REMERON) 7.5 MG tablet Take 7.5 mg by mouth at bedtime as needed. 03/30/22   [provider]  Probiotic Product (PROBIOTIC PO) Take 1 capsule by mouth daily.    [provider]  Vitamin D, Ergocalciferol, (DRISDOL) 1.25 MG (50000 UNIT) CAPS capsule Take 1 capsule (50,000 Units total) by mouth every 7 (seven) days. 02/05/22   Donnal Moat T, PA-C  DULoxetine (CYMBALTA) 20 MG capsule Take 1 capsule (20 mg total) by mouth daily. Patient not taking: Reported on 03/20/2019 01/11/19 06/21/19  Donnal Moat T, PA-C      Allergies    Metoclopramide, Penicillins, Erenumab-aooe, Fluvoxamine, Penicillin g benzathine, Sumatriptan, and Zoloft [sertraline hcl]    Review of Systems   Review of Systems  Constitutional:  Positive for fever.  Eyes:  Negative for visual disturbance.  Gastrointestinal:  Positive for diarrhea, nausea  and vomiting.  Neurological:  Positive for headaches. Negative for weakness and numbness.    Physical Exam Updated Vital Signs BP 119/81   Pulse 98   Temp 98.3 F (36.8 C) (Oral)   Resp 16   Ht 5' 8.5" (1.74 m)   Wt 113.4 kg   LMP 08/01/2022 (Exact Date)   SpO2 96%   BMI 37.46 kg/m  Physical Exam Vitals and nursing note reviewed.  Constitutional:      Appearance: She is well-developed. She is obese.  HENT:     Head: Normocephalic and atraumatic.  Eyes:     Extraocular Movements: Extraocular movements intact.     Pupils: Pupils are equal, round, and reactive to  light.  Cardiovascular:     Rate and Rhythm: Normal rate and regular rhythm.     Heart sounds: Normal heart sounds.  Pulmonary:     Effort: Pulmonary effort is normal.     Breath sounds: Normal breath sounds.  Abdominal:     Palpations: Abdomen is soft.     Tenderness: There is no abdominal tenderness.  Musculoskeletal:     Cervical back: Normal range of motion. No rigidity.  Skin:    General: Skin is warm and dry.  Neurological:     Mental Status: She is alert.     Comments: CN 3-12 grossly intact. 5/5 strength in all 4 extremities. Grossly normal sensation. Normal finger to nose.      ED Results / Procedures / Treatments   Labs (all labs ordered are listed, but only abnormal results are displayed) Labs Reviewed  BASIC METABOLIC PANEL - Abnormal; Notable for the following components:      Result Value   Potassium 3.1 (*)    Glucose, Bld 130 (*)    All other components within normal limits  GLUCOSE, CAPILLARY - Abnormal; Notable for the following components:   Glucose-Capillary 111 (*)    All other components within normal limits  RESP PANEL BY RT-PCR (FLU A&B, COVID) ARPGX2  CBC WITH DIFFERENTIAL/PLATELET  LIPASE, BLOOD  TSH    EKG EKG Interpretation  Date/Time:  Tuesday August 04 2022 17:34:19 EST Ventricular Rate:  110 PR Interval:  134 QRS Duration: 68 QT Interval:  316 QTC Calculation: 427 R Axis:   24 Text Interpretation: Sinus tachycardia Possible Inferior infarct , age undetermined Cannot rule out Anterior infarct , age undetermined  similar to Nov 2023 Confirmed by Sherwood Gambler [phone]) on 08/04/2022 5:39:14 PM  Radiology DG Chest 2 View  Result Date: 08/04/2022 CLINICAL DATA:  Shortness of breath. EXAM: CHEST - 2 VIEW COMPARISON:  May 13, 2022. FINDINGS: The heart size and mediastinal contours are within normal limits. Both lungs are clear. The visualized skeletal structures are unremarkable. IMPRESSION: No active cardiopulmonary disease.  Electronically Signed   By: Marijo Conception M.D.   On: 08/04/2022 17:56    Procedures Procedures    Medications Ordered in ED Medications  lactated ringers bolus 1,000 mL (0 mLs Intravenous Stopped 08/04/22 2225)  ketorolac (TORADOL) 15 MG/ML injection 15 mg (15 mg Intravenous Given 08/04/22 2112)  promethazine (PHENERGAN) 25 mg in sodium chloride 0.9 % 50 mL IVPB (0 mg Intravenous Stopped 08/04/22 2220)  potassium chloride SA (KLOR-CON M) CR tablet 40 mEq (40 mEq Oral Given 08/04/22 2225)    ED Course/ Medical Decision Making/ A&P                           Medical  Decision Making Amount and/or Complexity of Data Reviewed External Data Reviewed: notes. Labs:     Details: No leukocytosis.  Mild hypokalemia.  COVID/flu negative. Radiology: ordered and independent interpretation performed.    Details: No pneumonia ECG/medicine tests: independent interpretation performed.    Details: Sinus tachycardia but no ischemia.  Risk Prescription drug management.   From a headache perspective, patient's been having this headache for over a month.  She had a negative head CT a few weeks ago.  She is not currently febrile.  While she has had a recent fever its in the setting of vomiting and diarrhea and I suspect that she has her chronic headache that is positional that is worse with dehydration from an acute GI illness.  Has a benign abdominal exam.  I do not think she needs emergent LP.  I think she could use an MRI but I do not think it is emergent at this point.  Vital signs have normalized.  Highly doubt PE or other acute chest process despite her feeling of near syncope earlier.  She was given IV Phenergan, Toradol, fluids and is doing better.  At this point, will refer to outpatient neuro, she is asking for a [facility] neuro referral.  Otherwise, appears stable for discharge home with return precautions.        Final Clinical Impression(s) / ED Diagnoses Final diagnoses:  Occipital  headache  Acute gastroenteritis    Rx / DC Orders ED Discharge Orders          Ordered    Ambulatory referral to Neurology       Comments: An appointment is requested in approximately: 1 week   08/04/22 2226    promethazine (PHENERGAN) 25 MG tablet  Every 6 hours PRN        08/04/22 2227    ondansetron (ZOFRAN-ODT) 4 MG disintegrating tablet  Every 8 hours PRN        08/04/22 2227              Sherwood Gambler, MD 08/04/22 2334

## 2022-08-04 NOTE — Progress Notes (Signed)
ACUTE VISIT Chief Complaint  Patient presents with   Follow-up    Seen at ED for headaches   HPI: Brandy [name] is a 36 y.o. female with PMHx significant for symptomatic PVC's,chronic headaches, GERD, fatigue, GAD, vit D def, and post natal depressive disorder here today complaining of worsening headaches, which have led to multiple ER visits in the past month.  Evaluated in the ED on 11/13 and 07/22/22 for headaches. Last visit with her neurologist in 08/2021, Dr Everlena Cooper. Lab Results  Component Value Date   CREATININE 0.95 08/04/2022   BUN 7 08/04/2022   NA 137 08/04/2022   K 3.1 (L) 08/04/2022   CL 105 08/04/2022   CO2 22 08/04/2022   Lab Results  Component Value Date   LIPASE 36 08/04/2022   Lab Results  Component Value Date   WBC 9.0 08/04/2022   HGB 12.9 08/04/2022   HCT 39.6 08/04/2022   MCV 83.7 08/04/2022   PLT 395 08/04/2022   Lab Results  Component Value Date   ALT 25 07/22/2022   AST 17 07/22/2022   ALKPHOS 68 07/22/2022   BILITOT 0.5 07/22/2022   She is concerned about gait disturbances, dizziness,blurry vision, confusion, short-term memory problems, head and neck pain, ear pressure, "pulsatile" tinnitus, and upper back pain, bilateral. These symptoms have been intermittent since the birth of her daughter, 50.  She describes the headaches as "positional," with worsening pain upon standing and some relief when lying down. She also reports hearing like "fluid" in her neck. She is concerned about hydrocephalus or other CSF abnormalities. She thinks she may also need a LP.  According to pt, ED provider recommending having a brain and neck MRI. She is trying to establish with a different neurologist. Head CT on 07/13/22: No acute intracranial abnormality.  Brain MRI in 07/2020: Normal.  She has been experiencing nausea, vomiting, and diarrhea since Friday, with no known sick contacts. The frequency of diarrhea has decreased, and the last episode of  vomiting was yesterday. States that she has lost approximately 10 pounds since the onset of these symptoms. Negative for fever or chills. She has had similar GI symptoms for which she has seen GI bit states that this time it is different. She is on Promethazine and Zofran.  She denies any blood in the stool and has not had any recent changes in medication.   Also reporting episodes when she feels like her HR goes up, 90's and feeling dizzy like she is going to pass out. No new stressors.  He follows with psychiatrist, currently on Clonazepam,Remeron, and Wellbutrin  She saw cardiologist in 05/2022, GI in 03/2022, neuro 08/2021, and sport medicine in 01/2022.  She has sen rheumatologist for generalized myalgias, 08/2021.  Review of Systems  Constitutional:  Positive for activity change, appetite change and fatigue.  Respiratory:  Negative for cough and wheezing.   Cardiovascular:  Negative for chest pain and leg swelling.  Genitourinary:  Negative for decreased urine volume, dysuria and hematuria.  Skin:  Negative for rash.  Neurological:  Negative for tremors, syncope and numbness.  Psychiatric/Behavioral:  Negative for confusion and hallucinations.   See other pertinent positives and negatives in HPI.  Current Outpatient Medications on File Prior to Visit  Medication Sig Dispense Refill   buPROPion (WELLBUTRIN XL) 150 MG 24 hr tablet Take 1 tablet (150 mg total) by mouth daily. 90 tablet 0   Cholecalciferol (VITAMIN D) 2000 units CAPS Take 2,000 Units by mouth daily.  clonazePAM (KLONOPIN) 0.5 MG tablet Take 1 tablet (0.5 mg total) by mouth 2 (two) times daily as needed for anxiety. 60 tablet 2   esomeprazole (NEXIUM) 40 MG capsule TAKE 1 CAPSULE (40 MG TOTAL) BY MOUTH 2 (TWO) TIMES DAILY BEFORE A MEAL. 180 capsule 1   ibuprofen (ADVIL) 600 MG tablet Take 1 tablet (600 mg total) by mouth every 6 (six) hours as needed. 30 tablet 0   mirtazapine (REMERON) 7.5 MG tablet Take 7.5 mg by  mouth at bedtime as needed.     ondansetron (ZOFRAN-ODT) 4 MG disintegrating tablet Take 1 tablet (4 mg total) by mouth every 8 (eight) hours as needed for nausea or vomiting. 10 tablet 0   Probiotic Product (PROBIOTIC PO) Take 1 capsule by mouth daily.     promethazine (PHENERGAN) 25 MG tablet Take 1 tablet (25 mg total) by mouth every 6 (six) hours as needed for nausea or vomiting. 15 tablet 0   Vitamin D, Ergocalciferol, (DRISDOL) 1.25 MG (50000 UNIT) CAPS capsule Take 1 capsule (50,000 Units total) by mouth every 7 (seven) days. 12 capsule 1   [DISCONTINUED] DULoxetine (CYMBALTA) 20 MG capsule Take 1 capsule (20 mg total) by mouth daily. (Patient not taking: Reported on 03/20/2019) 30 capsule 1   No current facility-administered medications on file prior to visit.   Past Medical History:  Diagnosis Date   Abdominal pain, epigastric 02/18/2017   Allergy    Anterior neck pain 12/30/2015   Anxiety    Anxiety associated with birthing process 06/14/2018   B12 deficiency 07/21/2016   Back pain    Chest pain    Chronic mixed headache syndrome 06/14/2018   COVID-19 virus infection 03/2020   with infusion    Depression    DUB (dysfunctional uterine bleeding) 06/04/2011   Dysphagia 07/20/2018   Enlarged thyroid gland 12/30/2015   Fatigue 12/30/2015   Gallstones    Generalized anxiety disorder 06/09/2016   GERD (gastroesophageal reflux disease) 02/19/2011   Headache, unspecified headache type 03/05/2018   History of nephrolithiasis    Kidney stone    Medication overuse headache 06/14/2018   Menorrhagia 11/09/2018   Migraine    Missed abortion 11/09/2018   Nausea vomiting and diarrhea 12/28/2013   Nausea without vomiting 02/18/2017   NEPHROLITHIASIS, HX OF 12/16/2007   Qualifier: Diagnosis of  By: Tawanna Cooler MD, Eugenio Hoes    Other fatigue    Palpitations 03/21/2018   Pituitary tumor    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   Precordial pain 03/21/2018   Prediabetes    PVC's (premature ventricular  contractions)    Routine general medical examination at a health care facility 09/27/2014   Serum calcium elevated 12/28/2013   SOB (shortness of breath) on exertion    SVD (spontaneous vaginal delivery) 02/02/2018   Tendinitis of right ankle 08/14/2014   TMJ syndrome 05/30/2012   Vitamin D deficiency    Allergies  Allergen Reactions   Metoclopramide Palpitations and Other (See Comments)    Rapid heart rate   Penicillins Hives, Other (See Comments) and Rash    Childhood reaction  Has patient had a PCN reaction causing immediate rash, facial/tongue/throat swelling, SOB or lightheadedness with hypotension: No  Has patient had a PCN reaction causing severe rash involving mucus membranes or skin necrosis: No  Has patient had a PCN reaction that required hospitalization No  Has patient had a PCN reaction occurring within the last 10 years: No  If all of the above answers are "NO", then may  proceed with Cephalosporin use.   Erenumab-Aooe Other (See Comments)    Other reaction(s): Myalgias (intolerance) Constipation, hair loss   Fluvoxamine Other (See Comments)    Other reaction(s): Fainted   Penicillin G Benzathine Other (See Comments)   Sumatriptan Palpitations   Zoloft [Sertraline Hcl] Anxiety    Social History   Socioeconomic History   Marital status: Married    Spouse name: Jonathon   Number of children: 1   Years of education: Not on file   Highest education level: Bachelor's degree (e.g., BA, AB, BS)  Occupational History   Occupation: customer care representative  Tobacco Use   Smoking status: Never   Smokeless tobacco: Never  Vaping Use   Vaping Use: Never used  Substance and Sexual Activity   Alcohol use: Not Currently   Drug use: No   Sexual activity: Yes  Other Topics Concern   Not on file  Social History Narrative   Patient is right-handed. She lives with her husband and child in a split-level home.    Daughter born 01/2018   She avoids caffeine.    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  Physical Exam Vitals and nursing note reviewed.  Constitutional:      General: She is not in acute distress.    Appearance: She is well-developed.  HENT:     Head: Normocephalic and atraumatic.  Eyes:     Conjunctiva/sclera: Conjunctivae normal.  Cardiovascular:     Rate and Rhythm: Normal rate and regular rhythm.     Heart sounds: No murmur heard. Pulmonary:     Effort: Pulmonary effort is normal. No respiratory distress.     Breath sounds: Normal breath sounds.  Abdominal:     Palpations: Abdomen is soft. There is no hepatomegaly or mass.     Tenderness: There is no abdominal tenderness.  Musculoskeletal:     Cervical back: Spasms present. No tenderness. Pain with movement present. Normal range of motion.     Thoracic back: Spasms and tenderness present. No bony tenderness.       Back:     Right lower leg: No edema.     Left lower leg: No edema.  Lymphadenopathy:     Cervical: No cervical adenopathy.  Skin:    General: Skin is warm.     Findings: No erythema or rash.  Neurological:     General: No focal deficit present.     Mental Status: She is alert and oriented to person, place, and time.     Cranial Nerves: No cranial nerve deficit.     Gait: Gait normal.  Psychiatric:        Mood and Affect: Mood is anxious.     Comments: Well groomed, good eye contact.   ASSESSMENT AND PLAN:  Ms. Kenesha was seen today for follow-up.  Diagnoses and all orders for this visit: Orders Placed This Encounter  Procedures   MR  Brain W Wo Contrast   MR Cervical  Spine Wo Contrast   Chronic upper back pain Chronic thoracic back pain. We could add a muscle relaxant, she prefers to hold on adding more medications. Local massage, icy hot/asper cream may help. Not interested in PT at this time. Continue following with sport medicine.  Cervicalgia Chronic but concerned about fluid like noise she has noted recently. Examination today does not suggest a serious process. Cervical MRI ordered as requested.  Other headache syndrome ? Tension headache. Head CT and brain MRI done in 2021 for similar symptoms negative otherwise.She is very concerned about a serious process. Requesting brain MRI. Pending appt with a new neurologist. Instructed about warning signs.  Hypokalemia Received KLOR 40 meq in the ER, diarrhea has improved. Continue K+ rich diet.  Generalized anxiety disorder  Could be a contributing factor for some of her complaints. Following with psychiatrist regularly.  Acute gastroenteritis  She has had similar symptoms in the past, evaluated by GI in 03/2022. Reporting symptoms as different to her chronic GI problems and improving. Continue adequate hydration. Instructed about warning signs.  Return if symptoms worsen or fail to improve.  Trong Gosling G. [place], MD  [facility]. [facility] office.

## 2022-08-04 NOTE — Telephone Encounter (Signed)
Patient seeking referral to neuro for headaches. Has appointment on 08/05/22

## 2022-08-04 NOTE — Discharge Instructions (Addendum)
If you develop continued, recurrent, or worsening headache, fever, neck stiffness, vomiting, blurry or double vision, weakness or numbness in your arms or legs, trouble speaking, or any other new/concerning symptoms then return to the ER for evaluation.  

## 2022-08-04 NOTE — ED Triage Notes (Signed)
C/o feeling unwell the past few weeks with headache, was seen here for same. Headache, shortness of breath, diarrhea, vomiting since Friday. States felt like she was going to pass out at [employer]. C/o fatigue, and anxiety.

## 2022-08-05 ENCOUNTER — Encounter: Payer: Self-pay | Admitting: Family Medicine

## 2022-08-05 ENCOUNTER — Ambulatory Visit (INDEPENDENT_AMBULATORY_CARE_PROVIDER_SITE_OTHER): Payer: Managed Care, Other (non HMO) | Admitting: Family Medicine

## 2022-08-05 ENCOUNTER — Ambulatory Visit: Payer: Managed Care, Other (non HMO) | Admitting: Family Medicine

## 2022-08-05 VITALS — BP 118/70 | HR 96 | Resp 12 | Ht 68.5 in | Wt 250.0 lb

## 2022-08-05 DIAGNOSIS — E876 Hypokalemia: Secondary | ICD-10-CM

## 2022-08-05 DIAGNOSIS — G8929 Other chronic pain: Secondary | ICD-10-CM

## 2022-08-05 DIAGNOSIS — M549 Dorsalgia, unspecified: Secondary | ICD-10-CM | POA: Diagnosis not present

## 2022-08-05 DIAGNOSIS — K529 Noninfective gastroenteritis and colitis, unspecified: Secondary | ICD-10-CM

## 2022-08-05 DIAGNOSIS — G4489 Other headache syndrome: Secondary | ICD-10-CM

## 2022-08-05 DIAGNOSIS — F411 Generalized anxiety disorder: Secondary | ICD-10-CM

## 2022-08-05 DIAGNOSIS — M542 Cervicalgia: Secondary | ICD-10-CM | POA: Diagnosis not present

## 2022-08-05 LAB — TSH: TSH: 0.994 u[IU]/mL (ref 0.350–4.500)

## 2022-08-05 NOTE — Patient Instructions (Addendum)
A few things to remember from today's visit:  Cervicalgia - Plan: MR Cervical Spine Wo Contrast  Chronic upper back pain  Other headache syndrome - Plan: MR Brain W Wo Contrast  Pending appt with neuro. Follow with your ortho is upper back pain continues. Arrange appt with your psychiatrist.  MRI of head and neck ordered as requested.  If you need refills for medications you take chronically, please call your pharmacy. Do not use My Chart to request refills or for acute issues that need immediate attention. If you send a my chart message, it may take a few days to be addressed, specially if I am not in the office.  Please be sure medication list is accurate. If a new problem present, please set up appointment sooner than planned today.

## 2022-08-06 NOTE — Telephone Encounter (Signed)
Inbound call from patient wanting to schedule an appt for ongoing diarrhea, there was nothing available until January and patient states she can not wait that long for an appt. I told her I would sent a message to a nurse to get back with her regarding her symptoms. After our conversation I saw this phone note that stated Dr.Gessner would have to accept her back. Please advise, thank you.

## 2022-08-07 ENCOUNTER — Encounter: Payer: Self-pay | Admitting: Family Medicine

## 2022-08-07 NOTE — Telephone Encounter (Signed)
Please see note below and advise  

## 2022-08-07 NOTE — Telephone Encounter (Signed)
Pt made aware of Dr. Gessner recommendations: Pt verbalized understanding with all questions answered.   

## 2022-08-07 NOTE — Telephone Encounter (Signed)
She needs to return to [facility] or find a different practice - She left [place] and I am not accepting her back

## 2022-08-10 ENCOUNTER — Telehealth: Payer: Self-pay | Admitting: Family Medicine

## 2022-08-10 NOTE — Telephone Encounter (Signed)
I called and spoke with patient. She is needed FMLA coverage for the days she missed last week - 12/4-12/8. Paperwork completed & on pcp's desk for signature.

## 2022-08-10 NOTE — Telephone Encounter (Signed)
Papers faxed & copy sent to scan.

## 2022-08-10 NOTE — Telephone Encounter (Signed)
Pt called to FU on FMLA papers faxed on 08/07/22 Papers are in MD's eFax folder Pt states she needs these papers signed & returned ASAP, as she is trying to return to work.   Please advise.

## 2022-08-14 ENCOUNTER — Ambulatory Visit (HOSPITAL_BASED_OUTPATIENT_CLINIC_OR_DEPARTMENT_OTHER)
Admission: RE | Admit: 2022-08-14 | Discharge: 2022-08-14 | Disposition: A | Discharge status: Home / Self Care | Payer: Managed Care, Other (non HMO) | Source: Ambulatory Visit | Attending: Family Medicine | Admitting: Family Medicine

## 2022-08-14 DIAGNOSIS — R519 Headache, unspecified: Secondary | ICD-10-CM | POA: Diagnosis present

## 2022-08-14 DIAGNOSIS — G4489 Other headache syndrome: Secondary | ICD-10-CM

## 2022-08-14 DIAGNOSIS — M542 Cervicalgia: Secondary | ICD-10-CM

## 2022-08-14 MED ORDER — GADOBUTROL 1 MMOL/ML IV SOLN
10.0000 mL | Freq: Once | INTRAVENOUS | Status: AC | PRN
  Administered 2022-08-14: 10 mL via INTRAVENOUS
  Filled 2022-08-14: qty 10

## 2022-09-01 ENCOUNTER — Telehealth: Payer: Self-pay | Admitting: Family Medicine

## 2022-09-01 DIAGNOSIS — G4489 Other headache syndrome: Secondary | ICD-10-CM

## 2022-09-01 NOTE — Telephone Encounter (Signed)
Referral placed.

## 2022-09-01 NOTE — Telephone Encounter (Signed)
Pt is calling and would like a referral to [facility] for her headaches. Pt can not see [facility] neurologist due to she has been seen at [facility]

## 2022-09-03 ENCOUNTER — Encounter: Payer: Self-pay | Admitting: Family Medicine

## 2022-09-16 ENCOUNTER — Ambulatory Visit (INDEPENDENT_AMBULATORY_CARE_PROVIDER_SITE_OTHER): Payer: Managed Care, Other (non HMO) | Admitting: Orthopaedic Surgery

## 2022-09-16 ENCOUNTER — Encounter: Payer: Self-pay | Admitting: Orthopaedic Surgery

## 2022-09-16 ENCOUNTER — Ambulatory Visit (INDEPENDENT_AMBULATORY_CARE_PROVIDER_SITE_OTHER): Payer: Managed Care, Other (non HMO)

## 2022-09-16 VITALS — BP 115/80 | HR 89 | Ht 68.5 in | Wt 250.0 lb

## 2022-09-16 DIAGNOSIS — M542 Cervicalgia: Secondary | ICD-10-CM

## 2022-09-16 NOTE — Progress Notes (Signed)
Office Visit Note   Patient: Brandy [name]           Date of Birth: 1985-10-27           MRN: [id number] Visit Date: 09/16/2022              Requested by: Martinique, Betty G, MD [address] [place],  [place] [address] PCP: Martinique, Betty G, MD   Assessment & Plan: Visit Diagnoses: No diagnosis found.  Plan: Discussed with patient it appears the symptoms are not related to her cervical spine where she has some mild changes.  Appropriate evaluation should be with the neurologist.  We reviewed MRI scan and plain radiographs as well.  Follow-Up Instructions: No follow-ups on file.   Orders:  No orders of the defined types were placed in this encounter.  No orders of the defined types were placed in this encounter.     Procedures: No procedures performed   Clinical Data: No additional findings.   Subjective: Chief Complaint  Patient presents with   Neck - Pain   Lower Back - Pain    HPI 37 year old female seen post MRI 08/14/2022.  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  She has had some pain down her right arm with occasional weakness.  She has tried Naprosyn Excedrin ibuprofen, Celebrex, heat, ice.  She notes grinding and popping with rotation in her neck.  No relief with ice.  Patient is also had other symptoms with lightheadedness dizziness.  She does have a neurology appointment coming up she states she is on the call and (early appointment since her appointment currently scheduled in a couple months.  Review of Systems positive for history of anxiety, chronic mixed headaches, depression, dysphagia.   Objective: Vital Signs: BP 115/80   Pulse 89   Ht 5' 8.5" (1.74 m)   Wt 250 lb (113.4 kg)   BMI 37.46 kg/m   Physical Exam Constitutional:      Appearance: She is well-developed.  HENT:     Head: Normocephalic.     Right Ear: External ear normal.     Left Ear: External ear normal. There is  no impacted cerumen.  Eyes:     Pupils: Pupils are equal, round, and reactive to light.  Neck:     Thyroid: No thyromegaly.     Trachea: No tracheal deviation.  Cardiovascular:     Rate and Rhythm: Normal rate.  Pulmonary:     Effort: Pulmonary effort is normal.  Abdominal:     Palpations: Abdomen is soft.  Musculoskeletal:     Cervical back: No rigidity.  Skin:    General: Skin is warm and dry.  Neurological:     Mental Status: She is alert and oriented to person, place, and time.  Psychiatric:        Behavior: Behavior normal.     Ortho Exam patient has intact reflexes tenderness over the trapezius some brachial plexus tenderness.  Specialty Comments:  No specialty comments available.  Imaging: Narrative & Impression  CLINICAL DATA:  Provided history: Neck pain, chronic. Severe neck pain, non-radiating. Cervicalgia.   EXAM: MRI CERVICAL SPINE WITHOUT CONTRAST   TECHNIQUE: Multiplanar, multisequence MR imaging of the cervical spine was performed. No intravenous contrast was administered.   COMPARISON:  Cervical spine radiographs 09/09/2021.   FINDINGS: Alignment: Straightening of the expected cervical lordosis. No significant spondylolisthesis.   Vertebrae: Vertebral body height is  maintained. No significant marrow edema or focal suspicious osseous lesion.   Cord: No signal abnormality identified within the cervical spinal cord.   Posterior Fossa, vertebral arteries, paraspinal tissues: Posterior fossa assessed on same-day brain MRI. Flow voids preserved within the imaged cervical vertebral arteries. No paraspinal mass or collection.   Disc levels:   Mild multilevel disc degeneration, greatest at C5-C6.   C2-C3: No significant disc herniation or stenosis.   C3-C4: No significant disc herniation or stenosis.   C4-C5: Shallow disc bulge. Mild facet arthrosis on the left. No significant spinal canal or foraminal stenosis.   C5-C6: Shallow disc bulge.  Mild facet arthrosis on the left. No significant spinal canal or foraminal stenosis.   C6-C7: Slight disc bulge. No significant spinal canal or foraminal stenosis.   C7-T1: No significant disc herniation or stenosis.   IMPRESSION: Cervical spondylosis, as outlined and with findings most notably as follows.   At C4-C5 and C5-C6, there are shallow disc bulges and mild left-sided facet arthrosis. No significant spinal canal or foraminal stenosis.   At C6-C7, there is a slight disc bulge. No significant spinal canal or foraminal stenosis.   Mild multilevel disc degeneration, greatest at C5-C6.     Electronically Signed   By: Kellie Simmering D.O.   On: 08/14/2022 20:14       PMFS History: Patient Active Problem List   Diagnosis Date Noted   Pituitary adenoma ([place]) 09/30/2021   Other chest pain 06/23/2021   Other fatigue 06/23/2021   PVC (premature ventricular contraction) 10/04/2020   Obesity (BMI 30-39.9) 10/04/2020   Ventricular premature depolarization 10/04/2020   PVC's (premature ventricular contractions)    Allergy    History of pituitary adenoma 09/03/2020   Muscle twitch 09/03/2020   Anxiety    Gallstones    History of nephrolithiasis    Kidney stone    Pituitary tumor    Symptomatic PVCs 04/30/2020   Shortness of breath 04/30/2020   Pneumonia due to COVID-19 virus 04/30/2020   Obesity 04/30/2020   COVID-19 virus infection 03/2020   Menorrhagia 11/09/2018   Missed abortion 11/09/2018   Dysphagia 07/20/2018   Anxiety associated with birthing process 06/14/2018   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   Chronic mixed headache syndrome 06/14/2018   Medication overuse headache 06/14/2018   Palpitations 03/21/2018   Precordial pain 03/21/2018   Headache, unspecified headache type 03/05/2018   SVD (spontaneous vaginal delivery) 02/02/2018   Nausea without vomiting 02/18/2017   Abdominal pain, epigastric 02/18/2017   Vitamin D deficiency 07/21/2016   B12  deficiency 07/21/2016   Generalized anxiety disorder 06/09/2016   Anterior neck pain 12/30/2015   Thyroid nodule 12/30/2015   Fatigue 12/30/2015   Enlarged thyroid gland 12/30/2015   Routine general medical examination at a health care facility 09/27/2014   Tendinitis of right ankle 08/14/2014   Nausea vomiting and diarrhea 12/28/2013   Serum calcium elevated 12/28/2013   TMJ syndrome 05/30/2012   DUB (dysfunctional uterine bleeding) 06/04/2011   GERD (gastroesophageal reflux disease) 02/19/2011   Depression 09/06/2008   History of urinary stone 12/16/2007   NEPHROLITHIASIS, HX OF 12/16/2007   Past Medical History:  Diagnosis Date   Abdominal pain, epigastric 02/18/2017   Allergy    Anterior neck pain 12/30/2015   Anxiety    Anxiety associated with birthing process 06/14/2018   B12 deficiency 07/21/2016   Back pain    Chest pain    Chronic mixed headache syndrome 06/14/2018   COVID-19 virus infection 03/2020  with infusion    Depression    DUB (dysfunctional uterine bleeding) 06/04/2011   Dysphagia 07/20/2018   Enlarged thyroid gland 12/30/2015   Fatigue 12/30/2015   Gallstones    Generalized anxiety disorder 06/09/2016   GERD (gastroesophageal reflux disease) 02/19/2011   Headache, unspecified headache type 03/05/2018   History of nephrolithiasis    Kidney stone    Medication overuse headache 06/14/2018   Menorrhagia 11/09/2018   Migraine    Missed abortion 11/09/2018   Nausea vomiting and diarrhea 12/28/2013   Nausea without vomiting 02/18/2017   NEPHROLITHIASIS, HX OF 12/16/2007   Qualifier: Diagnosis of  By: Sherren Mocha MD, Jory Ee    Other fatigue    Palpitations 03/21/2018   Pituitary tumor    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   Precordial pain 03/21/2018   Prediabetes    PVC's (premature ventricular contractions)    Routine general medical examination at a health care facility 09/27/2014   Serum calcium elevated 12/28/2013   SOB (shortness of breath) on exertion     SVD (spontaneous vaginal delivery) 02/02/2018   Tendinitis of right ankle 08/14/2014   TMJ syndrome 05/30/2012   Vitamin D deficiency     Family History  Problem Relation Age of Onset   Diabetes Mother    Mental illness Mother    Asthma Mother    Stomach cancer Mother    Colon polyps Mother    Heart disease Mother    Hypertension Mother    Arthritis Mother    Thyroid disease Mother    Cancer Mother    Anxiety disorder Mother    Depression Mother    Obesity Mother    Diabetes Father    Hypertension Father    Hyperlipidemia Father    Cancer Father    Sleep apnea Father    Anxiety disorder Sister    Colon cancer Maternal Grandmother    Diabetes Paternal Grandmother    Breast cancer Maternal Aunt    Lung cancer Maternal Aunt    Diabetes Paternal Aunt    Diabetes Maternal Uncle    Diabetes Maternal Uncle    Esophageal cancer Neg Hx    Rectal cancer Neg Hx     Past Surgical History:  Procedure Laterality Date   CHOLECYSTECTOMY  2014   KIDNEY STONE SURGERY     x 5   TYMPANOSTOMY TUBE PLACEMENT     WISDOM TOOTH EXTRACTION     Social History   Occupational History   Occupation: customer care representative  Tobacco Use   Smoking status: Never   Smokeless tobacco: Never  Vaping Use   Vaping Use: Never used  Substance and Sexual Activity   Alcohol use: Not Currently   Drug use: No   Sexual activity: Yes

## 2022-09-23 ENCOUNTER — Telehealth (INDEPENDENT_AMBULATORY_CARE_PROVIDER_SITE_OTHER): Payer: Managed Care, Other (non HMO) | Admitting: Family Medicine

## 2022-09-23 ENCOUNTER — Encounter: Payer: Self-pay | Admitting: Family Medicine

## 2022-09-23 VITALS — Ht 68.5 in

## 2022-09-23 DIAGNOSIS — G4489 Other headache syndrome: Secondary | ICD-10-CM

## 2022-09-23 DIAGNOSIS — M549 Dorsalgia, unspecified: Secondary | ICD-10-CM

## 2022-09-23 DIAGNOSIS — F411 Generalized anxiety disorder: Secondary | ICD-10-CM | POA: Diagnosis not present

## 2022-09-23 NOTE — Progress Notes (Unsigned)
Virtual Visit via Video Note I connected with Brandy [name] on 09/24/22 by a video enabled telemedicine application and verified that I am speaking with the correct person using two identifiers. Location patient: home Location provider:work office Persons participating in the virtual visit: patient, provider  I discussed the limitations of evaluation and management by telemedicine and the availability of in person appointments. The patient expressed understanding and agreed to proceed.  Chief Complaint  Patient presents with   Referral    [facility] due to headaches   HPI: Brandy [name] is a 37 yo female with PMHx significant for chronic headaches, PVC's,GERD,GAD, and seasonal allergies concerned about persistent occipital headaches. She is positive she has a CSF leak.  She was evaluated by neurologist on 09/17/22 and today she is requesting a referral to neuro at [facility], Dr Lorelle Gibbs.  The onset of the current headache and neck pain in November/2023, with a sudden onset.  She reports an inability to remain upright due to severe nausea, a metallic taste, dizziness, and a sensation of fluid in the neck. Upper back pain. No hx of trauma. Symptoms are exacerbated when sitting or standing and are relieved when lying flat.  She does not feel like GAD is playing a role. She feels like the right test has not been ordered. Negative for fever, chills, MS changes,focal weakness. She has missed work and needs a note, afraid she is going to lose her job and her house. Brain MRI 08/14/22: Unremarkable MRI appearance of the brain. No evidence of acute intracranial abnormality.  Cervical MRI 08/14/22: Cervical spondylosis, as outlined and with findings most notably as follows.  At C4-C5 and C5-C6, there are shallow disc bulges and mild left-sided facet arthrosis. No significant spinal canal or foraminal stenosis.  At C6-C7, there is a slight disc bulge. No significant spinal canal or foraminal  stenosis. Mild multilevel disc degeneration, greatest at C5-C6.   She has seen Dr. Lorin Mercy, ortho, she is afraid of starting PT, does not feel it will help.  ROS: See pertinent positives and negatives per HPI.  Past Medical History:  Diagnosis Date   Abdominal pain, epigastric 02/18/2017   Allergy    Anterior neck pain 12/30/2015   Anxiety    Anxiety associated with birthing process 06/14/2018   B12 deficiency 07/21/2016   Back pain    Chest pain    Chronic mixed headache syndrome 06/14/2018   COVID-19 virus infection 03/2020   with infusion    Depression    DUB (dysfunctional uterine bleeding) 06/04/2011   Dysphagia 07/20/2018   Enlarged thyroid gland 12/30/2015   Fatigue 12/30/2015   Gallstones    Generalized anxiety disorder 06/09/2016   GERD (gastroesophageal reflux disease) 02/19/2011   Headache, unspecified headache type 03/05/2018   History of nephrolithiasis    Kidney stone    Medication overuse headache 06/14/2018   Menorrhagia 11/09/2018   Migraine    Missed abortion 11/09/2018   Nausea vomiting and diarrhea 12/28/2013   Nausea without vomiting 02/18/2017   NEPHROLITHIASIS, HX OF 12/16/2007   Qualifier: Diagnosis of  By: Sherren Mocha MD, Jory Ee    Other fatigue    Palpitations 03/21/2018   Pituitary tumor    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   Precordial pain 03/21/2018   Prediabetes    PVC's (premature ventricular contractions)    Routine general medical examination at a health care facility 09/27/2014   Serum calcium elevated 12/28/2013   SOB (shortness of breath) on exertion    SVD (  spontaneous vaginal delivery) 02/02/2018   Tendinitis of right ankle 08/14/2014   TMJ syndrome 05/30/2012   Vitamin D deficiency     Past Surgical History:  Procedure Laterality Date   CHOLECYSTECTOMY  2014   KIDNEY STONE SURGERY     x 5   TYMPANOSTOMY TUBE PLACEMENT     WISDOM TOOTH EXTRACTION      Family History  Problem Relation Age of Onset   Diabetes Mother    Mental illness  Mother    Asthma Mother    Stomach cancer Mother    Colon polyps Mother    Heart disease Mother    Hypertension Mother    Arthritis Mother    Thyroid disease Mother    Cancer Mother    Anxiety disorder Mother    Depression Mother    Obesity Mother    Diabetes Father    Hypertension Father    Hyperlipidemia Father    Cancer Father    Sleep apnea Father    Anxiety disorder Sister    Colon cancer Maternal Grandmother    Diabetes Paternal Grandmother    Breast cancer Maternal Aunt    Lung cancer Maternal Aunt    Diabetes Paternal Aunt    Diabetes Maternal Uncle    Diabetes Maternal Uncle    Esophageal cancer Neg Hx    Rectal cancer Neg Hx     Social History   Socioeconomic History   Marital status: Married    Spouse name: Jonathon   Number of children: 1   Years of education: Not on file   Highest education level: Bachelor's degree (e.g., BA, AB, BS)  Occupational History   Occupation: customer care representative  Tobacco Use   Smoking status: Never   Smokeless tobacco: Never  Vaping Use   Vaping Use: Never used  Substance and Sexual Activity   Alcohol use: Not Currently   Drug use: No   Sexual activity: Yes  Other Topics Concern   Not on file  Social History Narrative   Patient is right-handed. She lives with her husband and child in a split-level home.    Daughter born 01/2018   She avoids caffeine.    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   Social Determinants of Health   Financial Resource Strain: Not on file  Food Insecurity: Not on file  Transportation Needs: Not on file  Physical Activity: Not on file  Stress: Not on file  Social Connections: Not on file  Intimate Partner Violence: Not on file    Current Outpatient Medications:    buPROPion (WELLBUTRIN XL) 150 MG 24 hr tablet, Take 1 tablet (150 mg total) by mouth daily., Disp: 90 tablet, Rfl: 0   Cholecalciferol  (VITAMIN D) 2000 units CAPS, Take 2,000 Units by mouth daily., Disp: , Rfl:    clonazePAM (KLONOPIN) 0.5 MG tablet, Take 1 tablet (0.5 mg total) by mouth 2 (two) times daily as needed for anxiety., Disp: 60 tablet, Rfl: 2   esomeprazole (NEXIUM) 40 MG capsule, TAKE 1 CAPSULE (40 MG TOTAL) BY MOUTH 2 (TWO) TIMES DAILY BEFORE A MEAL., Disp: 180 capsule, Rfl: 1   ibuprofen (ADVIL) 600 MG tablet, Take 1 tablet (600 mg total) by mouth every 6 (six) hours as needed., Disp: 30 tablet, Rfl: 0   mirtazapine (REMERON) 7.5 MG tablet, Take 7.5 mg by mouth at bedtime as needed., Disp: ,  Rfl:    ondansetron (ZOFRAN-ODT) 4 MG disintegrating tablet, Take 1 tablet (4 mg total) by mouth every 8 (eight) hours as needed for nausea or vomiting., Disp: 10 tablet, Rfl: 0   Probiotic Product (PROBIOTIC PO), Take 1 capsule by mouth daily., Disp: , Rfl:    promethazine (PHENERGAN) 25 MG tablet, Take 1 tablet (25 mg total) by mouth every 6 (six) hours as needed for nausea or vomiting., Disp: 15 tablet, Rfl: 0   Vitamin D, Ergocalciferol, (DRISDOL) 1.25 MG (50000 UNIT) CAPS capsule, Take 1 capsule (50,000 Units total) by mouth every 7 (seven) days., Disp: 12 capsule, Rfl: 1  EXAM:  VITALS per patient if applicable:Ht 5' 8.5" (6.37 m)   BMI 37.46 kg/m   GENERAL: alert, oriented, appears well and in no acute distress. She is in bed.  HEENT: atraumatic, conjunctiva clear, no obvious abnormalities on inspection of external nose and ears  NECK: normal movements of the head and neck  LUNGS: on inspection no signs of respiratory distress, breathing rate appears normal, no obvious gross SOB, gasping or wheezing  CV: no obvious cyanosis  MS: moves all visible extremities without noticeable abnormality  PSYCH/NEURO: pleasant and cooperative, depressed mood and anxious, speech and thought processing grossly intact  ASSESSMENT AND PLAN:  Discussed the following assessment and plan:  Other headache syndrome We discussed  possible etiologies, so far head imaging has not revealed a serious process. She is convened a CSF leak is causing symptoms. 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 She is seeking for a referral to see another neuro, Dr Pearline Cables at [facility]. Referral placed. Letter to covered for days she has missed and to go back to work Monday will be provided.  Upper back pain Neck and interscapular pain, I think it is musculoskeletal and could explained some of reported symptoms. Recommend re-considering PT and following ortho treatment recommendations. Continue following with ortho. . Generalized anxiety disorder Following with psychiatrist. She does not feel like this problem is contributing to some of reported symptoms.   We discussed possible serious and likely etiologies, options for evaluation and workup, limitations of telemedicine visit vs in person visit, treatment, treatment risks and precautions. The patient was advised to call back or seek an in-person evaluation if the symptoms worsen or if the condition fails to improve as anticipated. I discussed the assessment and treatment plan with the patient. The patient was provided an opportunity to ask questions and all were answered. The patient agreed with the plan and demonstrated an understanding of the instructions.  I spent a total of 32 minutes in both face to face and non face to face activities for this visit on the date of this encounter. During this time history was obtained and documented, examination was performed, prior labs/imaging reviewed, and assessment/plan discussed.  Return if symptoms worsen or fail to improve.  Phynix Horton G. Martinique, MD  [facility]. [place] office.

## 2022-09-24 ENCOUNTER — Encounter: Payer: Self-pay | Admitting: Family Medicine

## 2022-09-25 ENCOUNTER — Telehealth: Payer: Self-pay

## 2022-09-25 NOTE — Telephone Encounter (Signed)
Transition Care Management Unsuccessful Follow-up Telephone Call  Date of discharge and from where:  09/24/22 [facility]  Attempts:  1st Attempt  Reason for unsuccessful TCM follow-up call:  Left voice message

## 2022-09-28 NOTE — Telephone Encounter (Signed)
Transition Care Management Unsuccessful Follow-up Telephone Call  Date of discharge and from where:  09/24/22 [facility]  Attempts:  2nd Attempt  Reason for unsuccessful TCM follow-up call:  Left voice message

## 2022-09-30 ENCOUNTER — Telehealth: Payer: Self-pay | Admitting: Physician Assistant

## 2022-09-30 NOTE — Telephone Encounter (Signed)
Patient called to schedule an appointment due to increased anxiety and depression. She appeared upset (crying during the conversation). Patient scheduled the first available on 10/14/22, last seen 06/29/22.  Contact patient at # [phone].

## 2022-10-01 NOTE — Telephone Encounter (Signed)
LVM to rtc 

## 2022-10-01 NOTE — Telephone Encounter (Signed)
Transition Care Management Unsuccessful Follow-up Telephone Call  Date of discharge and from where:  09/24/22 [facility]   Attempts:  3rd Attempt  Reason for unsuccessful TCM follow-up call:  Unable to reach patient

## 2022-10-06 ENCOUNTER — Other Ambulatory Visit: Payer: Self-pay | Admitting: Physician Assistant

## 2022-10-06 MED ORDER — MIRTAZAPINE 7.5 MG PO TABS: 7.5000 mg | ORAL_TABLET | Freq: Every evening | ORAL | 1 refills | Status: DC | PRN

## 2022-10-06 MED ORDER — BUPROPION HCL ER (XL) 300 MG PO TB24: 300.0000 mg | ORAL_TABLET | Freq: Every day | ORAL | 1 refills | Status: DC

## 2022-10-06 MED ORDER — CLONAZEPAM 0.5 MG PO TABS: 0.5000 mg | ORAL_TABLET | Freq: Three times a day (TID) | ORAL | 1 refills | Status: DC | PRN

## 2022-10-06 NOTE — Telephone Encounter (Signed)
Have her increase the Wellbutrin XL 150 mg to 2 pills every morning.  Also take the Klonopin as needed that she should have on hand.  If she needs a refill before the visit next week let me know.

## 2022-10-06 NOTE — Telephone Encounter (Signed)
I sent in a prescription for mirtazapine, Wellbutrin XL 300 mg and Klonopin.  She can take the Klonopin up to 3 times a day, on some days but of course take as little as she can get by with.  Thanks.

## 2022-10-06 NOTE — Telephone Encounter (Signed)
She has been taking klonopin twice a day but on some days has been taking 2.5 tabs.Can it be increased?She does need a refill as well as mirtazapine to [id number] [place], [place] - [address]

## 2022-10-14 ENCOUNTER — Encounter: Payer: Self-pay | Admitting: Physician Assistant

## 2022-10-14 ENCOUNTER — Ambulatory Visit (INDEPENDENT_AMBULATORY_CARE_PROVIDER_SITE_OTHER): Payer: Managed Care, Other (non HMO) | Admitting: Physician Assistant

## 2022-10-14 ENCOUNTER — Ambulatory Visit: Payer: Managed Care, Other (non HMO) | Admitting: Physician Assistant

## 2022-10-14 DIAGNOSIS — F410 Panic disorder [episodic paroxysmal anxiety] without agoraphobia: Secondary | ICD-10-CM

## 2022-10-14 DIAGNOSIS — F4323 Adjustment disorder with mixed anxiety and depressed mood: Secondary | ICD-10-CM

## 2022-10-14 DIAGNOSIS — F411 Generalized anxiety disorder: Secondary | ICD-10-CM | POA: Diagnosis not present

## 2022-10-14 DIAGNOSIS — F4321 Adjustment disorder with depressed mood: Secondary | ICD-10-CM

## 2022-10-14 MED ORDER — DULOXETINE HCL 30 MG PO CPEP: 30.0000 mg | ORAL_CAPSULE | Freq: Every day | ORAL | 0 refills | Status: DC

## 2022-10-14 MED ORDER — DULOXETINE HCL 60 MG PO CPEP: 60.0000 mg | ORAL_CAPSULE | Freq: Every day | ORAL | 1 refills | Status: DC

## 2022-10-14 NOTE — Progress Notes (Signed)
[facility]  Patient ID: Brandy [name],  MRN: [id number]  PCP: Martinique, Betty G, MD  Date of Evaluation: 10/14/2022 Time spent:25 minutes  Chief Complaint:  Chief Complaint   Anxiety; Depression; Stress; Follow-up   Virtual Visit via Telehealth  I connected with patient by telephone, with their informed consent, and verified patient privacy and that I am speaking with the correct person using two identifiers.  I am private, in my office and the patient is at home.  I discussed the limitations, risks, security and privacy concerns of performing an evaluation and management service by telephone and the availability of in person appointments. I also discussed with the patient that there may be a patient responsible charge related to this service. The patient expressed understanding and agreed to proceed.   I discussed the assessment and treatment plan with the patient. The patient was provided an opportunity to ask questions and all were answered. The patient agreed with the plan and demonstrated an understanding of the instructions.   The patient was advised to call back or seek an in-person evaluation if the symptoms worsen or if the condition fails to improve as anticipated.  I provided 25  minutes of non-face-to-face time during this encounter.  HISTORY/CURRENT STATUS: Routine med check.   Crying, very depressed. "I've got something wrong with me and nobody can figure it out." Hard time even getting out of bed, is working, but barely able to. Is fatigued, sleeps a lot and still doesn't feel rested. Doesn't enjoy things. No appetite. Wt is stable. Hard time focusing and remembering things. "I'm going to lose my job. And my house, husband and daughter. I've got to get better." Is terrified something bad is wrong. Has had 2 cousins on separate sides of her family that have died young from brain cancer and she's scared she might have something like that too.Has PA, Klonopin does  help. Mostly gets overwhelmed with worry. Personal hygiene is decreased. No SI/HI.  Patient denies increased energy with decreased need for sleep, increased talkativeness, racing thoughts, impulsivity or risky behaviors, increased spending, increased libido, grandiosity, increased irritability or anger, paranoia, or hallucinations.  Denies dizziness, syncope, seizures, numbness, tingling, tremor, tics, unsteady gait, slurred speech, confusion. Denies muscle or joint pain, stiffness, or dystonia. Denies unexplained weight loss, frequent infections, or sores that heal slowly.  No polyphagia, polydipsia, or polyuria. Denies visual changes or paresthesias.   Individual Medical History/ Review of Systems: Changes? :Yes      see HPI and notes from other providers   Past medications for mental health diagnoses include: Prozac, Luvox made her 'pass out,' Elavil, Ativan, Xanax, Zoloft 'revved her up,' Seroquel, Lithium for 1 pill, Buspar, Inderal,  NAC, Turmeric, Trazodone caused insomnia  Allergies: Metoclopramide, Penicillins, Erenumab-aooe, Fluvoxamine, Penicillin g benzathine, Sumatriptan, and Zoloft [sertraline hcl]  Current Medications:  Current Outpatient Medications:    buPROPion (WELLBUTRIN XL) 300 MG 24 hr tablet, Take 1 tablet (300 mg total) by mouth daily., Disp: 30 tablet, Rfl: 1   Cholecalciferol (VITAMIN D) 2000 units CAPS, Take 2,000 Units by mouth daily., Disp: , Rfl:    clonazePAM (KLONOPIN) 0.5 MG tablet, Take 1 tablet (0.5 mg total) by mouth 3 (three) times daily as needed for anxiety., Disp: 75 tablet, Rfl: 1   DULoxetine (CYMBALTA) 30 MG capsule, Take 1 capsule (30 mg total) by mouth daily., Disp: 14 capsule, Rfl: 0   DULoxetine (CYMBALTA) 60 MG capsule, Take 1 capsule (60 mg total) by mouth daily., Disp: 30  capsule, Rfl: 1   esomeprazole (NEXIUM) 40 MG capsule, TAKE 1 CAPSULE (40 MG TOTAL) BY MOUTH 2 (TWO) TIMES DAILY BEFORE A MEAL., Disp: 180 capsule, Rfl: 1   ibuprofen (ADVIL)  600 MG tablet, Take 1 tablet (600 mg total) by mouth every 6 (six) hours as needed., Disp: 30 tablet, Rfl: 0   mirtazapine (REMERON) 7.5 MG tablet, Take 1-2 tablets (7.5-15 mg total) by mouth at bedtime as needed., Disp: 60 tablet, Rfl: 1   ondansetron (ZOFRAN-ODT) 4 MG disintegrating tablet, Take 1 tablet (4 mg total) by mouth every 8 (eight) hours as needed for nausea or vomiting., Disp: 10 tablet, Rfl: 0   Probiotic Product (PROBIOTIC PO), Take 1 capsule by mouth daily., Disp: , Rfl:    promethazine (PHENERGAN) 25 MG tablet, Take 1 tablet (25 mg total) by mouth every 6 (six) hours as needed for nausea or vomiting., Disp: 15 tablet, Rfl: 0   Vitamin D, Ergocalciferol, (DRISDOL) 1.25 MG (50000 UNIT) CAPS capsule, Take 1 capsule (50,000 Units total) by mouth every 7 (seven) days., Disp: 12 capsule, Rfl: 1 Medication Side Effects: weight gain from mirtazapine.  Family Medical/ Social History: Changes? None  MENTAL HEALTH EXAM:  There were no vitals taken for this visit.There is no height or weight on file to calculate BMI.  General Appearance:  Unable to assess  Eye Contact:   Unable to assess  Speech:  Normal Rate and she's crying so hard it's difficult to understand her at times  Volume:  Normal  Mood:  Anxious, Depressed, and Hopeless  Affect:   Unable to assess  Thought Process:  Goal Directed and Descriptions of Associations: Circumstantial  Orientation:  Full (Time, Place, and Person)  Thought Content: Logical   Suicidal Thoughts:  No  Homicidal Thoughts:  No  Memory:  WNL  Judgement:  Good  Insight:  Good  Psychomotor Activity:   Unable to assess  Concentration:  Concentration: Good  Recall:  Good  Fund of Knowledge: Good  Language: Good  Assets:  Desire for Improvement  ADL's:  Intact  Cognition: WNL  Prognosis:  Good   DIAGNOSES:    ICD-10-CM   1. Situational mixed anxiety and depressive disorder  F43.23     2. Grief  F43.21     3. Generalized anxiety disorder   F41.1     4. Panic disorder  F41.0       Receiving Psychotherapy: No   Rinaldo Cloud, LCSW in the past.  RECOMMENDATIONS:  PDMP was reviewed.  Last Klonopin filled 10/06/2022.   I provided 25 minutes of non-face-to-face time during this encounter, including time spent before and after the visit in records review, medical decision making, counseling pertinent to today's visit, and charting.   Long discussion about anxiety/depression and treatment options. Recommend Cymbalta for depression but also chronic pain. Benefits, risks, SE disc and she accepts.  Consider change from Klonopin to valium b/c it can help with muscle spasms/pain. For now no change though.   Continue Wellbutrin XL 300 mg, 1 p.o. every morning. Continue Klonopin 0.5 mg, 1 p.o. twice daily as needed. Start Cymbalta 30 mg qd for 2 weeks, then 60 mg daily. Continue Mirtazapine 7.5 mg, 1-2 qhs prn sleep.  Continue vitamin D 50,000 IUs weekly. Strongly recommend she get back in therapy.  Return in 4 weeks  Donnal Moat, [place]

## 2022-10-19 ENCOUNTER — Ambulatory Visit: Payer: Managed Care, Other (non HMO) | Admitting: Family Medicine

## 2022-10-19 NOTE — Progress Notes (Deleted)
    Brandy [name] is a 37 y.o. female who presents to [place] at [facility] today for cont'd midline thoracic back pain. Pt was last seen by Dr. Georgina Snell on 02/26/22 and was referred to [facility] in [place]. Pt no-show her f/u visit in Aug. Pt has also been seen by Dr. Lorin Mercy on 09/16/22 for neck pain and has had numerous ED visits for HA. Today, pt reports  Dx imaging: 09/16/22 C-spine XR 08/14/22 C-spine & brain MRI 08/04/22 Chest XR 05/13/22 Chest angio CT & chest XR 01/10/22 Chest XR   Pertinent review of systems: ***  Relevant historical information: ***   Exam:  There were no vitals taken for this visit. General: Well Developed, well nourished, and in no acute distress.   MSK: ***    Lab and Radiology Results No results found for this or any previous visit (from the past 72 hour(s)). No results found.     Assessment and Plan: 37 y.o. female with ***   PDMP not reviewed this encounter. No orders of the defined types were placed in this encounter.  No orders of the defined types were placed in this encounter.    Discussed warning signs or symptoms. Please see discharge instructions. Patient expresses understanding.   ***

## 2022-10-20 NOTE — Progress Notes (Unsigned)
   I, Peterson Lombard, LAT, ATC acting as a scribe for Lynne Leader, MD.  Brandy [name] is a 37 y.o. female who presents to [place] at [facility] today for cont'd midline thoracic back pain. Pt was last seen by Dr. Georgina Snell on 02/26/22 and was referred to [facility] in [place]. Pt no-show her f/u visit in Aug. Pt has also been seen by Dr. Lorin Mercy on 09/16/22 for neck pain and has had numerous ED visits for HA. Today, pt reports  Dx imaging: 09/16/22 C-spine XR 08/14/22 C-spine & brain MRI 08/04/22 Chest XR 05/13/22 Chest angio CT & chest XR 01/10/22 Chest XR   Pertinent review of systems: ***  Relevant historical information: ***   Exam:  There were no vitals taken for this visit. General: Well Developed, well nourished, and in no acute distress.   MSK: ***    Lab and Radiology Results No results found for this or any previous visit (from the past 72 hour(s)). No results found.     Assessment and Plan: 37 y.o. female with ***   PDMP not reviewed this encounter. No orders of the defined types were placed in this encounter.  No orders of the defined types were placed in this encounter.    Discussed warning signs or symptoms. Please see discharge instructions. Patient expresses understanding.   ***

## 2022-10-21 ENCOUNTER — Encounter: Payer: Self-pay | Admitting: Family Medicine

## 2022-10-21 ENCOUNTER — Ambulatory Visit (INDEPENDENT_AMBULATORY_CARE_PROVIDER_SITE_OTHER): Payer: Managed Care, Other (non HMO) | Admitting: Family Medicine

## 2022-10-21 VITALS — BP 104/76 | HR 104 | Ht 68.5 in | Wt 253.8 lb

## 2022-10-21 DIAGNOSIS — M5414 Radiculopathy, thoracic region: Secondary | ICD-10-CM

## 2022-10-21 DIAGNOSIS — M546 Pain in thoracic spine: Secondary | ICD-10-CM | POA: Diagnosis not present

## 2022-10-21 DIAGNOSIS — G8929 Other chronic pain: Secondary | ICD-10-CM

## 2022-10-21 DIAGNOSIS — M5412 Radiculopathy, cervical region: Secondary | ICD-10-CM

## 2022-10-21 DIAGNOSIS — R519 Headache, unspecified: Secondary | ICD-10-CM

## 2022-10-21 NOTE — Patient Instructions (Signed)
Thank you for coming in today.   You should hear from MRI scheduling within 1 week. If you do not hear please let me know.    Please call [facility] at [phone] to schedule your spine injection.    Schedule the injection after the lumbar puncture (at least a week).   Plan PT especially if the lumbar puncture is not diagnostic.   We can do vestibular PT and regular PT in [place].   Let me know what you find out with the LP.

## 2022-10-30 ENCOUNTER — Other Ambulatory Visit: Payer: Self-pay | Admitting: Physician Assistant

## 2022-11-05 ENCOUNTER — Other Ambulatory Visit: Payer: Self-pay | Admitting: Physician Assistant

## 2022-11-10 ENCOUNTER — Other Ambulatory Visit: Payer: Managed Care, Other (non HMO)

## 2022-11-26 ENCOUNTER — Encounter: Payer: Self-pay | Admitting: Physician Assistant

## 2022-11-26 ENCOUNTER — Telehealth (INDEPENDENT_AMBULATORY_CARE_PROVIDER_SITE_OTHER): Payer: Managed Care, Other (non HMO) | Admitting: Physician Assistant

## 2022-11-26 DIAGNOSIS — F410 Panic disorder [episodic paroxysmal anxiety] without agoraphobia: Secondary | ICD-10-CM

## 2022-11-26 DIAGNOSIS — F4323 Adjustment disorder with mixed anxiety and depressed mood: Secondary | ICD-10-CM | POA: Diagnosis not present

## 2022-11-26 DIAGNOSIS — F331 Major depressive disorder, recurrent, moderate: Secondary | ICD-10-CM | POA: Diagnosis not present

## 2022-11-26 MED ORDER — ALPRAZOLAM 1 MG PO TABS: 0.5000 mg | ORAL_TABLET | Freq: Two times a day (BID) | ORAL | 0 refills | Status: DC | PRN

## 2022-11-26 NOTE — Progress Notes (Signed)
[facility]  Patient ID: Brandy [name],  MRN: [id number]  PCP: Martinique, Betty G, MD  Date of Evaluation: 11/26/2022 Time spent:20 minutes  Chief Complaint:  Chief Complaint   Anxiety; Depression; Follow-up    Virtual Visit via Telehealth  I connected with patient by a video enabled telemedicine application  with their informed consent, and verified patient privacy and that I am speaking with the correct person using two identifiers.  I am private, in my office and the patient is at home.  I discussed the limitations, risks, security and privacy concerns of performing an evaluation and management service by video and the availability of in person appointments. I also discussed with the patient that there may be a patient responsible charge related to this service. The patient expressed understanding and agreed to proceed.   I discussed the assessment and treatment plan with the patient. The patient was provided an opportunity to ask questions and all were answered. The patient agreed with the plan and demonstrated an understanding of the instructions.   The patient was advised to call back or seek an in-person evaluation if the symptoms worsen or if the condition fails to improve as anticipated.  I provided 20 minutes of non-face-to-face time during this encounter.  HISTORY/CURRENT STATUS: Routine med check.   Couldn't tolerate Cymbalta, took it for a week, caused severe nausea.  States she is very depressed.  She and her husband had to sell their home because she has not been able to work.  She is still very down because she knows there is something bad wrong with her but nobody can figure out what ideas.  See review of systems.  She never feels like getting out of bed.  She is weak.  Headache is pretty constant.  She had the patch to plug the leak in the side of the lumbar puncture but still has a headache.  States it went away for about a week and a half.  Does not really  enjoy anything.  States very sad all the time and cries easily.  Feels hopeless.  Has brain fog all the time and has a hard time remembering things.  Her appetite is decreased, even though she is taking mirtazapine.  Mirtazapine does help her sleep some, not working as well as it did though.  Does not feel rested when she gets up.  Denies suicidal or homicidal thoughts.  Also more anxious.  The Klonopin is no longer helping.  She has generalized anxiety on a daily basis.  Feels panicky with occasional panic attacks.  They usually just last a few minutes.  Can be associated with palpitations and sweats.  Usually does not get short of breath.  Patient denies increased energy with decreased need for sleep, increased talkativeness, racing thoughts, impulsivity or risky behaviors, increased spending, increased libido, grandiosity, increased irritability or anger, paranoia, or hallucinations.  Denies syncope, seizures, numbness, tingling, tremor, tics, unsteady gait, slurred speech, confusion. Denies muscle or joint pain, stiffness, or dystonia. Denies unexplained weight loss, frequent infections, or sores that heal slowly.  No polyphagia, polydipsia, or polyuria. Denies visual changes or paresthesias.   Individual Medical History/ Review of Systems: Changes? :Yes      had lumbar puncture. 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 Patch at puncture site had to be performed. See records on chart.    Past medications for mental health diagnoses include: Prozac, Luvox made her 'pass out,' Elavil, Ativan, Xanax, Zoloft 'revved  her up,' Seroquel, Lithium for 1 pill, Buspar, Inderal,  NAC, Turmeric, Trazodone caused insomnia  Allergies: Metoclopramide, Penicillins, Erenumab-aooe, Fluvoxamine, Penicillin g benzathine, Sumatriptan, and Zoloft [sertraline hcl]  Current Medications:  Current Outpatient Medications:    ALPRAZolam (XANAX) 1 MG tablet, Take 0.5-1 tablets (0.5-1 mg total)  by mouth 2 (two) times daily as needed for anxiety., Disp: 60 tablet, Rfl: 0   buPROPion (WELLBUTRIN XL) 300 MG 24 hr tablet, TAKE 1 TABLET BY MOUTH EVERY DAY, Disp: 90 tablet, Rfl: 1   Cholecalciferol (VITAMIN D) 2000 units CAPS, Take 2,000 Units by mouth daily., Disp: , Rfl:    esomeprazole (NEXIUM) 40 MG capsule, TAKE 1 CAPSULE (40 MG TOTAL) BY MOUTH 2 (TWO) TIMES DAILY BEFORE A MEAL., Disp: 180 capsule, Rfl: 1   ibuprofen (ADVIL) 600 MG tablet, Take 1 tablet (600 mg total) by mouth every 6 (six) hours as needed., Disp: 30 tablet, Rfl: 0   mirtazapine (REMERON) 7.5 MG tablet, TAKE 1-2 TABLETS (7.5-15 MG TOTAL) BY MOUTH AT BEDTIME AS NEEDED., Disp: 180 tablet, Rfl: 1   ondansetron (ZOFRAN-ODT) 4 MG disintegrating tablet, Take 1 tablet (4 mg total) by mouth every 8 (eight) hours as needed for nausea or vomiting., Disp: 10 tablet, Rfl: 0   Probiotic Product (PROBIOTIC PO), Take 1 capsule by mouth daily., Disp: , Rfl:    promethazine (PHENERGAN) 25 MG tablet, Take 1 tablet (25 mg total) by mouth every 6 (six) hours as needed for nausea or vomiting., Disp: 15 tablet, Rfl: 0   Vitamin D, Ergocalciferol, (DRISDOL) 1.25 MG (50000 UNIT) CAPS capsule, Take 1 capsule (50,000 Units total) by mouth every 7 (seven) days., Disp: 12 capsule, Rfl: 1 Medication Side Effects: weight gain from mirtazapine.  Family Medical/ Social History: Changes? None  MENTAL HEALTH EXAM:  There were no vitals taken for this visit.There is no height or weight on file to calculate BMI.  General Appearance: Casual and Well Groomed  Eye Contact:  Fair  Speech:  Normal Rate  Volume:  Normal  Mood:  Depressed and Hopeless  Affect:  Congruent, Depressed, and Tearful  Thought Process:  Goal Directed and Descriptions of Associations: Circumstantial  Orientation:  Full (Time, Place, and Person)  Thought Content: Logical   Suicidal Thoughts:  No  Homicidal Thoughts:  No  Memory:  WNL  Judgement:  Good  Insight:  Good   Psychomotor Activity:  Normal  Concentration:  Concentration: Good  Recall:  Good  Fund of Knowledge: Good  Language: Good  Assets:  Desire for Improvement  ADL's:  Intact  Cognition: WNL  Prognosis:  Good   DIAGNOSES:    ICD-10-CM   1. Situational mixed anxiety and depressive disorder  F43.23     2. Panic disorder  F41.0     3. Major depressive disorder, recurrent episode, moderate ([place])  F33.1       Receiving Psychotherapy: No   Rinaldo Cloud, LCSW in the past.  RECOMMENDATIONS:  PDMP was reviewed.  Last Klonopin filled 10/06/2022.   I provided 20 minutes of non-face-to-face time during this encounter, including time spent before and after the visit in records review, medical decision making, counseling pertinent to today's visit, and charting.   Long discussion about depression.  Since the mirtazapine is not working as well as it did I recommend she increase the dose.  She has only been taking 7.5 mg.  That can also help depression. We will change Klonopin to Xanax and increase the dose. Continue to follow-up with  other providers as they request. Strongly recommend daily exercise, even 10 minutes of walking outside will be beneficial.  Also healthy diet with 5-7 fruits and vegetables a day and also proteins, and at least 6 glasses of water per day.  D/C Klonopin.  Start Xanax 1 mg, 1/2-1 p.o. twice daily as needed anxiety. Continue Wellbutrin XL 300 mg, 1 p.o. every morning. Increase Mirtazapine 7.5 mg, 2 po q hs. Strongly recommend she get back in therapy.  Return in 6 weeks  Donnal Moat, PA-C

## 2022-11-28 ENCOUNTER — Ambulatory Visit
Admission: RE | Admit: 2022-11-28 | Discharge: 2022-11-28 | Disposition: A | Discharge status: Home / Self Care | Payer: Managed Care, Other (non HMO) | Source: Ambulatory Visit | Attending: Family Medicine | Admitting: Family Medicine

## 2022-11-28 DIAGNOSIS — G8929 Other chronic pain: Secondary | ICD-10-CM

## 2022-11-28 DIAGNOSIS — M5414 Radiculopathy, thoracic region: Secondary | ICD-10-CM

## 2022-11-30 ENCOUNTER — Telehealth: Payer: Self-pay | Admitting: Family Medicine

## 2022-11-30 DIAGNOSIS — R42 Dizziness and giddiness: Secondary | ICD-10-CM

## 2022-11-30 DIAGNOSIS — M5412 Radiculopathy, cervical region: Secondary | ICD-10-CM

## 2022-11-30 NOTE — Progress Notes (Signed)
I called Brandy [name] we talked about her MRI.  It is normal.  Plan to proceed with vestibular therapy if she still is having dizziness.  Plan fo cervical spine epidural steroid injection for continued cervical radicular symptoms.

## 2022-11-30 NOTE — Progress Notes (Deleted)
ACUTE VISIT No chief complaint on file.  HPI: [name] is a 37 y.o. female, who is here today complaining of *** HPI  Review of Systems See other pertinent positives and negatives in HPI.  Current Outpatient Medications on File Prior to Visit  Medication Sig Dispense Refill   ALPRAZolam (XANAX) 1 MG tablet Take 0.5-1 tablets (0.5-1 mg total) by mouth 2 (two) times daily as needed for anxiety. 60 tablet 0   buPROPion (WELLBUTRIN XL) 300 MG 24 hr tablet TAKE 1 TABLET BY MOUTH EVERY DAY 90 tablet 1   Cholecalciferol (VITAMIN D) 2000 units CAPS Take 2,000 Units by mouth daily.     esomeprazole (NEXIUM) 40 MG capsule TAKE 1 CAPSULE (40 MG TOTAL) BY MOUTH 2 (TWO) TIMES DAILY BEFORE A MEAL. 180 capsule 1   ibuprofen (ADVIL) 600 MG tablet Take 1 tablet (600 mg total) by mouth every 6 (six) hours as needed. 30 tablet 0   mirtazapine (REMERON) 7.5 MG tablet TAKE 1-2 TABLETS (7.5-15 MG TOTAL) BY MOUTH AT BEDTIME AS NEEDED. 180 tablet 1   ondansetron (ZOFRAN-ODT) 4 MG disintegrating tablet Take 1 tablet (4 mg total) by mouth every 8 (eight) hours as needed for nausea or vomiting. 10 tablet 0   Probiotic Product (PROBIOTIC PO) Take 1 capsule by mouth daily.     promethazine (PHENERGAN) 25 MG tablet Take 1 tablet (25 mg total) by mouth every 6 (six) hours as needed for nausea or vomiting. 15 tablet 0   Vitamin D, Ergocalciferol, (DRISDOL) 1.25 MG (50000 UNIT) CAPS capsule Take 1 capsule (50,000 Units total) by mouth every 7 (seven) days. 12 capsule 1   No current facility-administered medications on file prior to visit.    Past Medical History:  Diagnosis Date   Abdominal pain, epigastric 02/18/2017   Allergy    Anterior neck pain 12/30/2015   Anxiety    Anxiety associated with birthing process 06/14/2018   B12 deficiency 07/21/2016   Back pain    Chest pain    Chronic mixed headache syndrome 06/14/2018   COVID-19 virus infection 03/2020   with infusion    Depression    DUB  (dysfunctional uterine bleeding) 06/04/2011   Dysphagia 07/20/2018   Enlarged thyroid gland 12/30/2015   Fatigue 12/30/2015   Gallstones    Generalized anxiety disorder 06/09/2016   GERD (gastroesophageal reflux disease) 02/19/2011   Headache, unspecified headache type 03/05/2018   History of nephrolithiasis    Kidney stone    Medication overuse headache 06/14/2018   Menorrhagia 11/09/2018   Migraine    Missed abortion 11/09/2018   Nausea vomiting and diarrhea 12/28/2013   Nausea without vomiting 02/18/2017   NEPHROLITHIASIS, HX OF 12/16/2007   Qualifier: Diagnosis of  By: Sherren Mocha MD, Jory Ee    Other fatigue    Palpitations 03/21/2018   Pituitary tumor    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   Precordial pain 03/21/2018   Prediabetes    PVC's (premature ventricular contractions)    Routine general medical examination at a health care facility 09/27/2014   Serum calcium elevated 12/28/2013   SOB (shortness of breath) on exertion    SVD (spontaneous vaginal delivery) 02/02/2018   Tendinitis of right ankle 08/14/2014   TMJ syndrome 05/30/2012   Vitamin D deficiency    Allergies  Allergen Reactions   Metoclopramide Palpitations and Other (See Comments)    Rapid heart rate   Penicillins Hives, Other (See Comments) and Rash    Childhood reaction  Has patient had  a PCN reaction causing immediate rash, facial/tongue/throat swelling, SOB or lightheadedness with hypotension: No  Has patient had a PCN reaction causing severe rash involving mucus membranes or skin necrosis: No  Has patient had a PCN reaction that required hospitalization No  Has patient had a PCN reaction occurring within the last 10 years: No  If all of the above answers are "NO", then may proceed with Cephalosporin use.   Erenumab-Aooe Other (See Comments)    Other reaction(s): Myalgias (intolerance) Constipation, hair loss   Fluvoxamine Other (See Comments)    Other reaction(s): Fainted   Penicillin G Benzathine Other  (See Comments)   Sumatriptan Palpitations   Zoloft [Sertraline Hcl] Anxiety    Social History   Socioeconomic History   Marital status: Married    Spouse name: Brandy [name]   Number of children: 1   Years of education: Not on file   Highest education level: Bachelor's degree (e.g., BA, AB, BS)  Occupational History   Occupation: customer care representative  Tobacco Use   Smoking status: Never   Smokeless tobacco: Never  Vaping Use   Vaping Use: Never used  Substance and Sexual Activity   Alcohol use: Not Currently   Drug use: No   Sexual activity: Yes  Other Topics Concern   Not on file  Social History Narrative   Patient is right-handed. She lives with her husband and child in a split-level home.    Daughter born 01/2018   She avoids caffeine.    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 There is no height or weight on file to calculate BMI.  Physical Exam  ASSESSMENT AND PLAN: There are no diagnoses linked to this encounter.  No follow-ups on file.  Brandy G. Martinique, MD  [facility]. [place] office.  Discharge Instructions   None

## 2022-11-30 NOTE — Telephone Encounter (Signed)
Plan for cervical spine epidural steroid injection and vestibular physical therapy referral.  Please see my most recent office note.

## 2022-12-01 ENCOUNTER — Ambulatory Visit: Payer: Managed Care, Other (non HMO) | Admitting: Family Medicine

## 2022-12-03 ENCOUNTER — Telehealth: Payer: Self-pay | Admitting: Physician Assistant

## 2022-12-03 NOTE — Telephone Encounter (Signed)
Yes she can go back on the klonopin, but tell her to take the bottle of Xanax to the pharmacy for them to dispose of, then she can get the Klonopin filled. Let pharmacy know too.  Thanks.

## 2022-12-03 NOTE — Telephone Encounter (Signed)
Notified pharmacy and patient.  Pharmacy said they may call for confirmation that she should be on clonazepam when she attempts to fill next.

## 2022-12-03 NOTE — Telephone Encounter (Signed)
Called patient and she is not able to tolerate the alprazolam due to SE.  You had marked the clonazepam as ineffective and she said she had been on it so long that she was requiring more to treat the panic attacks so she wanted to try something else. She said she had a few clonazepam left and that she had a RF available, but didn't know if she could fill with just filling the alazopam. I see she has also tried diazepam 5 mg in 2019, discontinued per patient preference.

## 2022-12-03 NOTE — Telephone Encounter (Signed)
Pt called reporting tried Alprazolam 4-5 days. Feeling nauseous, sick, and breaking out in sweats. Would like to go back to Clonazepam. RTC [phone]. APT 5/10

## 2022-12-07 NOTE — Progress Notes (Deleted)
 ACUTE VISIT No chief complaint on file.  HPI: [name] is a 36 y.o. female, who is here today complaining of *** HPI  Review of Systems See other pertinent positives and negatives in HPI.  Current Outpatient Medications on File Prior to Visit  Medication Sig Dispense Refill   ALPRAZolam (XANAX) 1 MG tablet Take 0.5-1 tablets (0.5-1 mg total) by mouth 2 (two) times daily as needed for anxiety. 60 tablet 0   buPROPion (WELLBUTRIN XL) 300 MG 24 hr tablet TAKE 1 TABLET BY MOUTH EVERY DAY 90 tablet 1   Cholecalciferol (VITAMIN D) 2000 units CAPS Take 2,000 Units by mouth daily.     esomeprazole (NEXIUM) 40 MG capsule TAKE 1 CAPSULE (40 MG TOTAL) BY MOUTH 2 (TWO) TIMES DAILY BEFORE A MEAL. 180 capsule 1   ibuprofen (ADVIL) 600 MG tablet Take 1 tablet (600 mg total) by mouth every 6 (six) hours as needed. 30 tablet 0   mirtazapine (REMERON) 7.5 MG tablet TAKE 1-2 TABLETS (7.5-15 MG TOTAL) BY MOUTH AT BEDTIME AS NEEDED. 180 tablet 1   ondansetron (ZOFRAN-ODT) 4 MG disintegrating tablet Take 1 tablet (4 mg total) by mouth every 8 (eight) hours as needed for nausea or vomiting. 10 tablet 0   Probiotic Product (PROBIOTIC PO) Take 1 capsule by mouth daily.     promethazine (PHENERGAN) 25 MG tablet Take 1 tablet (25 mg total) by mouth every 6 (six) hours as needed for nausea or vomiting. 15 tablet 0   Vitamin D, Ergocalciferol, (DRISDOL) 1.25 MG (50000 UNIT) CAPS capsule Take 1 capsule (50,000 Units total) by mouth every 7 (seven) days. 12 capsule 1   No current facility-administered medications on file prior to visit.    Past Medical History:  Diagnosis Date   Abdominal pain, epigastric 02/18/2017   Allergy    Anterior neck pain 12/30/2015   Anxiety    Anxiety associated with birthing process 06/14/2018   B12 deficiency 07/21/2016   Back pain    Chest pain    Chronic mixed headache syndrome 06/14/2018   COVID-19 virus infection 03/2020   with infusion    Depression    DUB  (dysfunctional uterine bleeding) 06/04/2011   Dysphagia 07/20/2018   Enlarged thyroid gland 12/30/2015   Fatigue 12/30/2015   Gallstones    Generalized anxiety disorder 06/09/2016   GERD (gastroesophageal reflux disease) 02/19/2011   Headache, unspecified headache type 03/05/2018   History of nephrolithiasis    Kidney stone    Medication overuse headache 06/14/2018   Menorrhagia 11/09/2018   Migraine    Missed abortion 11/09/2018   Nausea vomiting and diarrhea 12/28/2013   Nausea without vomiting 02/18/2017   NEPHROLITHIASIS, HX OF 12/16/2007   Qualifier: Diagnosis of  By: Todd MD, Jeffrey A    Other fatigue    Palpitations 03/21/2018   Pituitary tumor    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   Precordial pain 03/21/2018   Prediabetes    PVC's (premature ventricular contractions)    Routine general medical examination at a health care facility 09/27/2014   Serum calcium elevated 12/28/2013   SOB (shortness of breath) on exertion    SVD (spontaneous vaginal delivery) 02/02/2018   Tendinitis of right ankle 08/14/2014   TMJ syndrome 05/30/2012   Vitamin D deficiency    Allergies  Allergen Reactions   Metoclopramide Palpitations and Other (See Comments)    Rapid heart rate   Penicillins Hives, Other (See Comments) and Rash    Childhood reaction  Has patient had   a PCN reaction causing immediate rash, facial/tongue/throat swelling, SOB or lightheadedness with hypotension: No  Has patient had a PCN reaction causing severe rash involving mucus membranes or skin necrosis: No  Has patient had a PCN reaction that required hospitalization No  Has patient had a PCN reaction occurring within the last 10 years: No  If all of the above answers are "NO", then may proceed with Cephalosporin use.   Erenumab-Aooe Other (See Comments)    Other reaction(s): Myalgias (intolerance) Constipation, hair loss   Fluvoxamine Other (See Comments)    Other reaction(s): Fainted   Penicillin G Benzathine Other  (See Comments)   Sumatriptan Palpitations   Zoloft [Sertraline Hcl] Anxiety    Social History   Socioeconomic History   Marital status: Married    Spouse name: Jonathon   Number of children: 1   Years of education: Not on file   Highest education level: Bachelor's degree (e.g., BA, AB, BS)  Occupational History   Occupation: customer care representative  Tobacco Use   Smoking status: Never   Smokeless tobacco: Never  Vaping Use   Vaping Use: Never used  Substance and Sexual Activity   Alcohol use: Not Currently   Drug use: No   Sexual activity: Yes  Other Topics Concern   Not on file  Social History Narrative   Patient is right-handed. She lives with her husband and child in a split-level home.    Daughter born 01/2018   She avoids caffeine.    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 There is no height or weight on file to calculate BMI.  Physical Exam  ASSESSMENT AND PLAN: There are no diagnoses linked to this encounter.  No follow-ups on file.  Betty G. Jordan, MD  [facility]. [place] office.  Discharge Instructions   None    

## 2022-12-08 ENCOUNTER — Ambulatory Visit: Payer: Managed Care, Other (non HMO) | Admitting: Family Medicine

## 2022-12-10 NOTE — Progress Notes (Unsigned)
ACUTE VISIT Chief Complaint  Patient presents with   Referral   HPI: [name] is a 37 y.o. female, who is here today complaining of *** HPI referral to an endocrinologist due to a pituitary issue seen on a previous MRI. She reports experiencing symptoms such as waking up in a sweat, feeling like her sugar is dropping, unintentional weight loss, nausea, weakness, fatigue, and hair loss for the past couple of months.  She also mentions a possible diagnosis of POTS by a neurologist, as her heart rate increases significantly upon standing. Her last cardiology visit was in May, but she was not evaluated for POTS at that time.  The patient complains of persistent nausea and epigastric pain, which worsens leading up to her menstrual period. Her last menstrual period was seven days ago. She is currently taking Nexium for acid reflux and is seeking a new gastroenterologist due to insurance issues and scheduling conflicts.  She is due for an annual pap smear and wishes to establish care with a gynecologist. She is also interested in having her hormones checked. The patient is currently taking vitamin D3 and K2, as well as vitamin D2, and her last vitamin D level was 30. She recently started taking a B-complex supplement and has noticed an improvement in her energy levels.  The patient is experiencing issues with her ears, including a sensation of popping, and is scheduled to see an ENT on the 25th. She has a history of balance issues and dizziness, which she believes may be related to her ears. She sees her psychiatrist approximately every six weeks. Review of Systems See other pertinent positives and negatives in HPI.  Current Outpatient Medications on File Prior to Visit  Medication Sig Dispense Refill   ALPRAZolam (XANAX) 1 MG tablet Take 0.5-1 tablets (0.5-1 mg total) by mouth 2 (two) times daily as needed for anxiety. 60 tablet 0   buPROPion (WELLBUTRIN XL) 300 MG 24 hr tablet TAKE 1  TABLET BY MOUTH EVERY DAY 90 tablet 1   Cholecalciferol (VITAMIN D) 2000 units CAPS Take 2,000 Units by mouth daily.     esomeprazole (NEXIUM) 40 MG capsule TAKE 1 CAPSULE (40 MG TOTAL) BY MOUTH 2 (TWO) TIMES DAILY BEFORE A MEAL. 180 capsule 1   ibuprofen (ADVIL) 600 MG tablet Take 1 tablet (600 mg total) by mouth every 6 (six) hours as needed. 30 tablet 0   mirtazapine (REMERON) 7.5 MG tablet TAKE 1-2 TABLETS (7.5-15 MG TOTAL) BY MOUTH AT BEDTIME AS NEEDED. 180 tablet 1   ondansetron (ZOFRAN-ODT) 4 MG disintegrating tablet Take 1 tablet (4 mg total) by mouth every 8 (eight) hours as needed for nausea or vomiting. 10 tablet 0   Probiotic Product (PROBIOTIC PO) Take 1 capsule by mouth daily.     promethazine (PHENERGAN) 25 MG tablet Take 1 tablet (25 mg total) by mouth every 6 (six) hours as needed for nausea or vomiting. 15 tablet 0   Vitamin D, Ergocalciferol, (DRISDOL) 1.25 MG (50000 UNIT) CAPS capsule Take 1 capsule (50,000 Units total) by mouth every 7 (seven) days. 12 capsule 1   No current facility-administered medications on file prior to visit.    Past Medical History:  Diagnosis Date   Abdominal pain, epigastric 02/18/2017   Allergy    Anterior neck pain 12/30/2015   Anxiety    Anxiety associated with birthing process 06/14/2018   B12 deficiency 07/21/2016   Back pain    Chest pain    Chronic mixed headache syndrome  06/14/2018   COVID-19 virus infection 03/2020   with infusion    Depression    DUB (dysfunctional uterine bleeding) 06/04/2011   Dysphagia 07/20/2018   Enlarged thyroid gland 12/30/2015   Fatigue 12/30/2015   Gallstones    Generalized anxiety disorder 06/09/2016   GERD (gastroesophageal reflux disease) 02/19/2011   Headache, unspecified headache type 03/05/2018   History of nephrolithiasis    Kidney stone    Medication overuse headache 06/14/2018   Menorrhagia 11/09/2018   Migraine    Missed abortion 11/09/2018   Nausea vomiting and diarrhea 12/28/2013   Nausea  without vomiting 02/18/2017   NEPHROLITHIASIS, HX OF 12/16/2007   Qualifier: Diagnosis of  By: Tawanna Cooler MD, Eugenio Hoes    Other fatigue    Palpitations 03/21/2018   Pituitary tumor    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   Precordial pain 03/21/2018   Prediabetes    PVC's (premature ventricular contractions)    Routine general medical examination at a health care facility 09/27/2014   Serum calcium elevated 12/28/2013   SOB (shortness of breath) on exertion    SVD (spontaneous vaginal delivery) 02/02/2018   Tendinitis of right ankle 08/14/2014   TMJ syndrome 05/30/2012   Vitamin D deficiency    Allergies  Allergen Reactions   Metoclopramide Palpitations and Other (See Comments)    Rapid heart rate   Penicillins Hives, Other (See Comments) and Rash    Childhood reaction  Has patient had a PCN reaction causing immediate rash, facial/tongue/throat swelling, SOB or lightheadedness with hypotension: No  Has patient had a PCN reaction causing severe rash involving mucus membranes or skin necrosis: No  Has patient had a PCN reaction that required hospitalization No  Has patient had a PCN reaction occurring within the last 10 years: No  If all of the above answers are "NO", then may proceed with Cephalosporin use.   Erenumab-Aooe Other (See Comments)    Other reaction(s): Myalgias (intolerance) Constipation, hair loss   Fluvoxamine Other (See Comments)    Other reaction(s): Fainted   Penicillin G Benzathine Other (See Comments)   Sumatriptan Palpitations   Zoloft [Sertraline Hcl] Anxiety    Social History   Socioeconomic History   Marital status: Married    Spouse name: Jonathon   Number of children: 1   Years of education: Not on file   Highest education level: Bachelor's degree (e.g., BA, AB, BS)  Occupational History   Occupation: customer care representative  Tobacco Use   Smoking status: Never   Smokeless tobacco: Never  Vaping Use   Vaping Use: Never used  Substance and  Sexual Activity   Alcohol use: Not Currently   Drug use: No   Sexual activity: Yes  Other Topics Concern   Not on file  Social History Narrative   Patient is right-handed. She lives with her husband and child in a split-level home.    Daughter born 01/2018   She avoids caffeine.    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  Physical Exam Vitals and nursing note reviewed.  Constitutional:      General: She is not in acute distress.    Appearance: She is well-developed.  HENT:     Head: Normocephalic and atraumatic.  Eyes:     Conjunctiva/sclera: Conjunctivae normal.  Cardiovascular:     Rate and Rhythm: Normal rate and regular rhythm.     Heart sounds: No murmur heard. Pulmonary:     Effort: Pulmonary effort is normal. No respiratory distress.     Breath sounds: Normal breath sounds.  Abdominal:     Palpations: Abdomen is soft. There is no hepatomegaly or mass.     Tenderness: There is no abdominal tenderness.  Lymphadenopathy:     Cervical: No cervical adenopathy.  Skin:    General: Skin is warm.     Findings: No erythema or rash.  Neurological:     General: No focal deficit present.     Mental Status: She is alert and oriented to person, place, and time.     Cranial Nerves: No cranial nerve deficit.     Gait: Gait normal.  Psychiatric:        Mood and Affect: Affect normal. Mood is anxious.     Comments: Well groomed, good eye  contact.    ASSESSMENT AND PLAN: Fatigue, unspecified type -     Ambulatory referral to Endocrinology -     TSH; Future -     Hemoglobin A1c; Future  Pituitary adenoma -     Ambulatory referral to Endocrinology  Nausea without vomiting -     Ambulatory referral to Gastroenterology -     TSH; Future -     Hemoglobin A1c; Future  Epigastric abdominal pain -     Ambulatory referral to Gastroenterology  Encounter for gynecological examination -     Ambulatory referral to Gynecology  Follow-up medical care requested by patient -     Ambulatory referral to Gynecology    Return if symptoms worsen or fail to improve.  Kilea Mccarey G. [place], MD  [facility]. [place] office.

## 2022-12-11 ENCOUNTER — Ambulatory Visit (INDEPENDENT_AMBULATORY_CARE_PROVIDER_SITE_OTHER): Payer: Managed Care, Other (non HMO) | Admitting: Family Medicine

## 2022-12-11 ENCOUNTER — Encounter: Payer: Self-pay | Admitting: Family Medicine

## 2022-12-11 VITALS — BP 118/70 | HR 100 | Resp 12 | Ht 68.5 in | Wt 247.0 lb

## 2022-12-11 DIAGNOSIS — R1013 Epigastric pain: Secondary | ICD-10-CM

## 2022-12-11 DIAGNOSIS — R110 Nausea: Secondary | ICD-10-CM | POA: Diagnosis not present

## 2022-12-11 DIAGNOSIS — Z01419 Encounter for gynecological examination (general) (routine) without abnormal findings: Secondary | ICD-10-CM

## 2022-12-11 DIAGNOSIS — R5383 Other fatigue: Secondary | ICD-10-CM | POA: Diagnosis not present

## 2022-12-11 DIAGNOSIS — D352 Benign neoplasm of pituitary gland: Secondary | ICD-10-CM

## 2022-12-11 DIAGNOSIS — Z5189 Encounter for other specified aftercare: Secondary | ICD-10-CM

## 2022-12-11 NOTE — Patient Instructions (Addendum)
A few things to remember from today's visit:  Fatigue, unspecified type - Plan: Ambulatory referral to Endocrinology, TSH, Hemoglobin A1c  Pituitary adenoma, Chronic - Plan: Ambulatory referral to Endocrinology  Nausea without vomiting - Plan: Ambulatory referral to Gastroenterology, TSH, Hemoglobin A1c  Epigastric abdominal pain - Plan: Ambulatory referral to Gastroenterology  Encounter for gynecological examination - Plan: Ambulatory referral to Gynecology  Follow-up medical care requested by patient - Plan: Ambulatory referral to Gynecology  Do not use My Chart to request refills or for acute issues that need immediate attention. If you send a my chart message, it may take a few days to be addressed, specially if I am not in the office.  Please be sure medication list is accurate. If a new problem present, please set up appointment sooner than planned today.

## 2022-12-12 LAB — HEMOGLOBIN A1C
Hgb A1c MFr Bld: 6.1 % of total Hgb — ABNORMAL HIGH (ref <5.7)
Mean Plasma Glucose: 128 mg/dL
eAG (mmol/L): 7.1 mmol/L

## 2022-12-12 LAB — TSH: TSH: 0.83 mIU/L

## 2022-12-16 ENCOUNTER — Telehealth: Payer: Self-pay | Admitting: Physician Assistant

## 2022-12-16 NOTE — Telephone Encounter (Signed)
Next visit is 01/08/23. Brandy [name] states that her Xanax has not been working for two weeks. Per [facility] they can't fill her Klonopin without [place] first talking to them. There number is [phone]. She has two pills left.

## 2022-12-16 NOTE — Telephone Encounter (Signed)
Patient was on clonazepam, switched to alprazolam, and wants to switch back to clonazepam.   Yes she can go back on the klonopin, but tell her to take the bottle of Xanax to the pharmacy for them to dispose of, then she can get the Klonopin filled. Let pharmacy know too. Thanks.   Patient has a RF available. I had told them that she was to return the alprazolam. Pharmacy said they don't accept controlled medications. She has an appt next month but it is a MyChart visit. Is it okay to go ahead and approve the fill of clonazepam?

## 2022-12-17 ENCOUNTER — Ambulatory Visit
Admission: RE | Admit: 2022-12-17 | Discharge: 2022-12-17 | Disposition: A | Discharge status: Home / Self Care | Payer: Managed Care, Other (non HMO) | Source: Ambulatory Visit | Attending: Family Medicine | Admitting: Family Medicine

## 2022-12-17 DIAGNOSIS — M5412 Radiculopathy, cervical region: Secondary | ICD-10-CM

## 2022-12-17 MED ORDER — TRIAMCINOLONE ACETONIDE 40 MG/ML IJ SUSP (RADIOLOGY)
60.0000 mg | Freq: Once | INTRAMUSCULAR | Status: AC
  Administered 2022-12-17: 60 mg via EPIDURAL

## 2022-12-17 MED ORDER — IOPAMIDOL (ISOVUE-M 300) INJECTION 61%
1.0000 mL | Freq: Once | INTRAMUSCULAR | Status: AC | PRN
  Administered 2022-12-17: 1 mL via EPIDURAL

## 2022-12-17 NOTE — Telephone Encounter (Signed)
No, she'll need to bring the Xanax here for proper disposal and then she can get the Klonopin.

## 2022-12-17 NOTE — Telephone Encounter (Signed)
Patient notified. She will see if her dad can bring by tomorrow.

## 2022-12-17 NOTE — Discharge Instructions (Signed)

## 2022-12-29 ENCOUNTER — Ambulatory Visit: Payer: Managed Care, Other (non HMO) | Attending: Neurosurgery | Admitting: Physical Therapy

## 2023-01-08 ENCOUNTER — Telehealth (INDEPENDENT_AMBULATORY_CARE_PROVIDER_SITE_OTHER): Payer: Managed Care, Other (non HMO) | Admitting: Physician Assistant

## 2023-01-08 DIAGNOSIS — Z91199 Patient's noncompliance with other medical treatment and regimen due to unspecified reason: Secondary | ICD-10-CM

## 2023-01-08 NOTE — Progress Notes (Signed)
4:32 PM, pt was not on Caregility for 4:30 appt. I called the # on file. Got VM identifying her as Brandy [name].  I left a message stating that I was calling for her appointment and I would try back in a few minutes. At 4:43 PM, I called her again, once again got the voicemail that I identified her, I left a message stating I was calling for her appointment but due to the appointment time being half over, advised her to call the office back to r/s. No show for appt.

## 2023-01-22 ENCOUNTER — Encounter: Payer: Self-pay | Admitting: Family Medicine

## 2023-01-22 DIAGNOSIS — M5412 Radiculopathy, cervical region: Secondary | ICD-10-CM

## 2023-01-26 ENCOUNTER — Other Ambulatory Visit: Payer: Self-pay

## 2023-01-26 ENCOUNTER — Encounter (HOSPITAL_COMMUNITY): Payer: Self-pay

## 2023-01-26 ENCOUNTER — Emergency Department (HOSPITAL_COMMUNITY)
Admission: EM | Admit: 2023-01-26 | Discharge: 2023-01-26 | Disposition: A | Discharge status: Home / Self Care | Payer: Managed Care, Other (non HMO) | Attending: Emergency Medicine | Admitting: Emergency Medicine

## 2023-01-26 ENCOUNTER — Emergency Department (HOSPITAL_COMMUNITY): Payer: Managed Care, Other (non HMO)

## 2023-01-26 DIAGNOSIS — R209 Unspecified disturbances of skin sensation: Secondary | ICD-10-CM | POA: Diagnosis not present

## 2023-01-26 DIAGNOSIS — Z1152 Encounter for screening for COVID-19: Secondary | ICD-10-CM | POA: Insufficient documentation

## 2023-01-26 DIAGNOSIS — R519 Headache, unspecified: Secondary | ICD-10-CM | POA: Insufficient documentation

## 2023-01-26 DIAGNOSIS — R202 Paresthesia of skin: Secondary | ICD-10-CM | POA: Insufficient documentation

## 2023-01-26 LAB — CBC WITH DIFFERENTIAL/PLATELET
Abs Immature Granulocytes: 0.03 10*3/uL (ref 0.00–0.07)
Basophils Absolute: 0 10*3/uL (ref 0.0–0.1)
Basophils Relative: 0 %
Eosinophils Absolute: 0 10*3/uL (ref 0.0–0.5)
Eosinophils Relative: 0 %
HCT: 42.1 % (ref 36.0–46.0)
Hemoglobin: 13.3 g/dL (ref 12.0–15.0)
Immature Granulocytes: 0 %
Lymphocytes Relative: 22 %
Lymphs Abs: 2.1 10*3/uL (ref 0.7–4.0)
MCH: 27.4 pg (ref 26.0–34.0)
MCHC: 31.6 g/dL (ref 30.0–36.0)
MCV: 86.6 fL (ref 80.0–100.0)
Monocytes Absolute: 0.5 10*3/uL (ref 0.1–1.0)
Monocytes Relative: 6 %
Neutro Abs: 6.8 10*3/uL (ref 1.7–7.7)
Neutrophils Relative %: 72 %
Platelets: 398 10*3/uL (ref 150–400)
RBC: 4.86 MIL/uL (ref 3.87–5.11)
RDW: 14 % (ref 11.5–15.5)
WBC: 9.5 10*3/uL (ref 4.0–10.5)
nRBC: 0 % (ref 0.0–0.2)

## 2023-01-26 LAB — COMPREHENSIVE METABOLIC PANEL
ALT: 22 U/L (ref 0–44)
AST: 18 U/L (ref 15–41)
Albumin: 3.9 g/dL (ref 3.5–5.0)
Alkaline Phosphatase: 61 U/L (ref 38–126)
Anion gap: 9 (ref 5–15)
BUN: 8 mg/dL (ref 6–20)
CO2: 23 mmol/L (ref 22–32)
Calcium: 9.2 mg/dL (ref 8.9–10.3)
Chloride: 103 mmol/L (ref 98–111)
Creatinine, Ser: 0.99 mg/dL (ref 0.44–1.00)
GFR, Estimated: >60 mL/min (ref >60)
Glucose, Bld: 118 mg/dL — ABNORMAL HIGH (ref 70–99)
Potassium: 3.8 mmol/L (ref 3.5–5.1)
Sodium: 135 mmol/L (ref 135–145)
Total Bilirubin: 0.5 mg/dL (ref 0.3–1.2)
Total Protein: 7.6 g/dL (ref 6.5–8.1)

## 2023-01-26 LAB — I-STAT BETA HCG BLOOD, ED (MC, WL, AP ONLY): I-stat hCG, quantitative: <5 m[IU]/mL (ref <5)

## 2023-01-26 LAB — SARS CORONAVIRUS 2 BY RT PCR: SARS Coronavirus 2 by RT PCR: NEGATIVE

## 2023-01-26 MED ORDER — ONDANSETRON 4 MG PO TBDP
4.0000 mg | ORAL_TABLET | Freq: Once | ORAL | Status: AC
  Administered 2023-01-26: 4 mg via ORAL
  Filled 2023-01-26: qty 1

## 2023-01-26 MED ORDER — ONDANSETRON HCL 4 MG/2ML IJ SOLN
4.0000 mg | Freq: Once | INTRAMUSCULAR | Status: AC
  Administered 2023-01-26: 4 mg via INTRAVENOUS
  Filled 2023-01-26: qty 2

## 2023-01-26 MED ORDER — ASPIRIN-ACETAMINOPHEN-CAFFEINE 250-250-65 MG PO TABS
2.0000 | ORAL_TABLET | ORAL | Status: AC
  Administered 2023-01-26: 2 via ORAL
  Filled 2023-01-26: qty 2

## 2023-01-26 MED ORDER — KETOROLAC TROMETHAMINE 15 MG/ML IJ SOLN
15.0000 mg | Freq: Once | INTRAMUSCULAR | Status: AC
  Administered 2023-01-26: 15 mg via INTRAVENOUS
  Filled 2023-01-26: qty 1

## 2023-01-26 MED ORDER — HYDROMORPHONE HCL 1 MG/ML IJ SOLN
1.0000 mg | Freq: Once | INTRAMUSCULAR | Status: AC
  Administered 2023-01-26: 1 mg via INTRAVENOUS
  Filled 2023-01-26: qty 1

## 2023-01-26 MED ORDER — LORAZEPAM 2 MG/ML IJ SOLN
1.0000 mg | Freq: Once | INTRAMUSCULAR | Status: AC
  Administered 2023-01-26: 1 mg via INTRAVENOUS
  Filled 2023-01-26: qty 1

## 2023-01-26 MED ORDER — PROMETHAZINE HCL 25 MG PO TABS: 25.0000 mg | ORAL_TABLET | Freq: Four times a day (QID) | ORAL | 0 refills | Status: AC | PRN

## 2023-01-26 MED ORDER — SODIUM CHLORIDE 0.9 % IV SOLN
25.0000 mg | Freq: Once | INTRAVENOUS | Status: AC
  Administered 2023-01-26: 25 mg via INTRAVENOUS
  Filled 2023-01-26: qty 1

## 2023-01-26 MED ORDER — IOHEXOL 350 MG/ML SOLN
75.0000 mL | Freq: Once | INTRAVENOUS | Status: AC | PRN
  Administered 2023-01-26: 75 mL via INTRAVENOUS

## 2023-01-26 MED ORDER — LACTATED RINGERS IV BOLUS
1000.0000 mL | Freq: Once | INTRAVENOUS | Status: AC
  Administered 2023-01-26: 1000 mL via INTRAVENOUS

## 2023-01-26 MED ORDER — DEXAMETHASONE SODIUM PHOSPHATE 10 MG/ML IJ SOLN
10.0000 mg | Freq: Once | INTRAMUSCULAR | Status: AC
  Administered 2023-01-26: 10 mg via INTRAVENOUS
  Filled 2023-01-26: qty 1

## 2023-01-26 NOTE — ED Triage Notes (Signed)
Pt arrives via EMS from her job. Pt reports sudden onset of a severe headache that started about 30 minutes ago. . She reports she had numbness and tingling to her left arm and had difficulty speaking for a few seconds. Pt Arrives AxOx4. Pt states the numbness, tingling, and trouble speaking has resolved. Pt not on blood thinners.

## 2023-01-26 NOTE — ED Notes (Signed)
Pt to ct 

## 2023-01-26 NOTE — ED Provider Triage Note (Signed)
Emergency Medicine Provider Triage Evaluation Note  Ovaline Windler , a 37 y.o. female  was evaluated in triage.  Pt complains of headache.  I was called in to evaluate this patient and EMS triage out of concerns about possibly calling a code stroke.  Patient reports that she began experiencing a severe headache prior to arriving here in the emergency department.  Has a history of severe headaches and migraines.  At the time of the headache, she reports that she began to experience a brief episode where she could not speak properly and had numbness and tingling in her arms which is since largely resolved.  Endorses nausea.  Presentation of headache at this time is very similar to prior headaches that patient has not presented to the emergency department for.  Review of Systems  Positive: As above Negative: As above  Physical Exam  BP (!) 142/100 (BP Location: Right Arm)   Pulse (!) 122   Temp 98.1 F (36.7 C)   Resp (!) 21   Ht 5' 8.5" (1.74 m)   Wt 111.1 kg   SpO2 100%   BMI 36.71 kg/m  Gen:   Awake, no distress   Resp:  Normal effort  MSK:   Moves extremities without difficulty  Other:  No notable neurological deficits on examination.  Pupils are PERRL.  Bilateral sensation and strength intact in upper and lower extremities.  Medical Decision Making  Medically screening exam initiated at 1:35 PM.  Appropriate orders placed.  Arella Tremble was informed that the remainder of the evaluation will be completed by another provider, this initial triage assessment does not replace that evaluation, and the importance of remaining in the ED until their evaluation is complete.  DDx: Complex migraine, severe headache, TIA, SAH. Patient does not currently meet stroke criteria and I do not believe that this presentation is consistent with a stroke so code stroke will not be called.   Smitty Knudsen, PA-C 01/26/23 1337

## 2023-01-26 NOTE — Discharge Instructions (Addendum)
Thank you for allowing me to be a part of your care today.    Your work-up today is overall very reassuring.  Your CT scans did not show evidence of stroke, hemorrhage, blood clots or aneurysm in the neck or brain.  Your lab workup also showed normal blood counts, kidney function, and electrolytes.    I have sent a prescription for phenergan for you to take when you are experiencing nausea and vomiting.  I recommend taking ibuprofen or Excedrin Migraine to help with headache pain.  Please schedule a follow-up appointment with your neurologist.   Return to the ED if you develop sudden worsening of your symptoms or if you have any new concerns.

## 2023-01-26 NOTE — ED Provider Notes (Signed)
EMERGENCY DEPARTMENT AT [facility] Provider Note   CSN: [id number] Arrival date & time: 01/26/23  1321     History  Chief Complaint  Patient presents with   Headache    Brandy [name] is a 37 y.o. female with past medical history significant for migraine, headaches, pituitary adenoma, kidney stones presents to the ED from work complaining of a severe, sudden onset headache that began approximately 30 minutes prior to her contacting EMS.  She also reports that she had numbness and tingling in her left arm and felt as if she had difficulty word finding and speaking.  Patient states the left arm symptoms and difficulty speaking has resolved, but the headache is still present and extremely painful with radiation into her cervical spine.  While at work, patient felt as if she was going to pass out upon standing, but did not lose consciousness.  Patient states that she has heard "fluid whooshing in the back of her head and neck for awhile".  She reports she has had her headaches worked up previously, but this one was completely different from previous headaches.  Denies vomiting, visual disturbance, difficulty walking, neck stiffness, weakness, facial asymmetry, dizziness.         Home Medications Prior to Admission medications   Medication Sig Start Date End Date Taking? Authorizing Provider  ALPRAZolam Prudy Feeler) 1 MG tablet Take 0.5-1 tablets (0.5-1 mg total) by mouth 2 (two) times daily as needed for anxiety. 11/26/22   Cherie Ouch, PA-C  buPROPion (WELLBUTRIN XL) 300 MG 24 hr tablet TAKE 1 TABLET BY MOUTH EVERY DAY 11/01/22   Melony Overly T, PA-C  Cholecalciferol (VITAMIN D) 2000 units CAPS Take 2,000 Units by mouth daily.    [provider]  esomeprazole (NEXIUM) 40 MG capsule TAKE 1 CAPSULE (40 MG TOTAL) BY MOUTH 2 (TWO) TIMES DAILY BEFORE A MEAL. 04/27/22   Arnaldo Natal, NP  ibuprofen (ADVIL) 600 MG tablet Take 1 tablet (600 mg total) by mouth  every 6 (six) hours as needed. 07/13/22   Jacalyn Lefevre, MD  mirtazapine (REMERON) 7.5 MG tablet TAKE 1-2 TABLETS (7.5-15 MG TOTAL) BY MOUTH AT BEDTIME AS NEEDED. 11/01/22   Melony Overly T, PA-C  ondansetron (ZOFRAN-ODT) 4 MG disintegrating tablet Take 1 tablet (4 mg total) by mouth every 8 (eight) hours as needed for nausea or vomiting. 08/04/22   Pricilla Loveless, MD  Probiotic Product (PROBIOTIC PO) Take 1 capsule by mouth daily.    [provider]  promethazine (PHENERGAN) 25 MG tablet Take 1 tablet (25 mg total) by mouth every 6 (six) hours as needed for nausea or vomiting. 08/04/22   Pricilla Loveless, MD  Vitamin D, Ergocalciferol, (DRISDOL) 1.25 MG (50000 UNIT) CAPS capsule Take 1 capsule (50,000 Units total) by mouth every 7 (seven) days. 02/05/22   Melony Overly T, PA-C      Allergies    Metoclopramide, Penicillins, Erenumab-aooe, Fluvoxamine, Penicillin g benzathine, Sumatriptan, and Zoloft [sertraline hcl]    Review of Systems   Review of Systems  Eyes:  Negative for photophobia and visual disturbance.  Gastrointestinal:  Positive for nausea. Negative for vomiting.  Musculoskeletal:  Positive for neck pain. Negative for gait problem and neck stiffness.  Neurological:  Positive for speech difficulty (difficulty finding words), light-headedness (near syncope), numbness and headaches. Negative for dizziness, syncope, facial asymmetry and weakness.    Physical Exam Updated Vital Signs BP 109/76   Pulse 92   Temp 98.1 F (36.7 C)  Resp 20   Ht 5' 8.5" (1.74 m)   Wt 111.1 kg   SpO2 98%   BMI 36.71 kg/m  Physical Exam Vitals and nursing note reviewed.  Constitutional:      General: She is not in acute distress.    Appearance: Normal appearance. She is not ill-appearing or diaphoretic.  Eyes:     General: Lids are normal. Vision grossly intact.     Extraocular Movements: Extraocular movements intact.     Conjunctiva/sclera: Conjunctivae normal.     Pupils: Pupils are  equal, round, and reactive to light.  Cardiovascular:     Rate and Rhythm: Normal rate and regular rhythm.  Pulmonary:     Effort: Pulmonary effort is normal.  Musculoskeletal:     Cervical back: Normal range of motion and neck supple. No rigidity. Pain with movement (with left lateral rotation and flexion) and muscular tenderness present. No spinous process tenderness. Normal range of motion.  Skin:    General: Skin is warm and dry.     Capillary Refill: Capillary refill takes less than 2 seconds.  Neurological:     Mental Status: She is alert and oriented to person, place, and time. Mental status is at baseline.     GCS: GCS eye subscore is 4. GCS verbal subscore is 5. GCS motor subscore is 6.     Cranial Nerves: Cranial nerves 2-12 are intact.     Sensory: Sensation is intact.     Motor: Motor function is intact.     Coordination: Coordination is intact.     Comments: Cranial Nerves:  II: peripheral fields grossly intact III,IV, VI: ptosis not present, extra-ocular movements intact bilaterally, direct and consensual pupillary light reflexes intact bilaterally V: facial sensation, jaw opening, and bite strength equal bilaterally VII: eyebrow raise, eyelid close, smile, frown, pucker equal bilaterally VIII: hearing grossly normal bilaterally  IX,X: palate elevation and swallowing intact XI: bilateral shoulder shrug and lateral head rotation equal and strong XII: midline tongue extension Motor: 5/5 strength in bilateral upper and lower extremities with equal grip strength.  Patient moves all extremities appropriately with intact coordination.  Sensation: Gross sensation intact bilaterally in upper and lower extremities. Gait was not assessed.   Psychiatric:        Attention and Perception: Attention and perception normal.        Mood and Affect: Mood is anxious.        Speech: Speech normal. Speech is not delayed or slurred.        Behavior: Behavior normal. Behavior is  cooperative.     ED Results / Procedures / Treatments   Labs (all labs ordered are listed, but only abnormal results are displayed) Labs Reviewed  COMPREHENSIVE METABOLIC PANEL - Abnormal; Notable for the following components:      Result Value   Glucose, Bld 118 (*)    All other components within normal limits  SARS CORONAVIRUS 2 BY RT PCR  CBC WITH DIFFERENTIAL/PLATELET  I-STAT BETA HCG BLOOD, ED ([facility], [facility], [facility] ONLY)    EKG None  Radiology CT Head Wo Contrast  Result Date: 01/26/2023 CLINICAL DATA:  Headache, sudden, severe EXAM: CT HEAD WITHOUT CONTRAST TECHNIQUE: Contiguous axial images were obtained from the base of the skull through the vertex without intravenous contrast. RADIATION DOSE REDUCTION: This exam was performed according to the departmental dose-optimization program which includes automated exposure control, adjustment of the mA and/or kV according to patient size and/or use of iterative reconstruction technique. COMPARISON:  MRI head 08/14/2022. FINDINGS: Brain: No evidence of acute infarction, hemorrhage, hydrocephalus, extra-axial collection or mass lesion/mass effect. Vascular: No hyperdense vessel. Skull: No acute fracture. Sinuses/Orbits: Clear sinuses.  No acute orbital findings. Other: No mastoid effusions. IMPRESSION: No evidence of acute intracranial abnormality. Electronically Signed   By: Feliberto Harts M.D.   On: 01/26/2023 13:56    Procedures Procedures    Medications Ordered in ED Medications  ondansetron (ZOFRAN-ODT) disintegrating tablet 4 mg (4 mg Oral Given 01/26/23 1337)  ketorolac (TORADOL) 15 MG/ML injection 15 mg (15 mg Intravenous Given 01/26/23 1515)  HYDROmorphone (DILAUDID) injection 1 mg (1 mg Intravenous Given 01/26/23 1556)    ED Course/ Medical Decision Making/ A&P                             Medical Decision Making Amount and/or Complexity of Data Reviewed Radiology: ordered.  Risk OTC drugs. Prescription drug  management.   This patient presents to the ED with chief complaint(s) of sudden, severe headache with left arm numbness and tingling with pertinent past medical history of migraine, headaches, pituitary adenoma, GAD.  The complaint involves an extensive differential diagnosis and also carries with it a high risk of complications and morbidity.    The differential diagnosis includes SAH, CVA, TIA, intracranial lesion, MS, other neurodegenerative disorder,    The initial plan is to obtain baseline labs, CT of head  Additional history obtained: Records reviewed  - patient has been seen by [facility] for ongoing headaches and cervical neck pain that all began after a fall in 07/2022.  Patient reports at this visit that she has had headaches, neck pain, blurry vision, brain fog, and dizziness.  She reports also that she feels like she can feel and hear fluid running up the middle of her neck (which is similar to today's complaint).  Diagnosis at this visit was post concussion syndrome.  Patient has had imaging, lumbar puncture, and steroid injections in to the spine without relief of symptoms.    Also reviewed patient's previous imaging including MRI of brain and cervical spine.  MRI brain without acute findings.  MRI of cervical spine with mild multilevel disc degeneration.    Initial Assessment:   Exam significant for patient who appears anxious and to be in pain.  EOM intact, PERRLA.  Patient has full ROM of cervical spine with increased pain on left lateral rotation and flexion.  CN 2-12 are grossly intact.  Patient moves all extremities appropriately with 5/5 strength and intact sensation.  Neuro exam overall unremarkable.    Independent ECG/labs interpretation:  The following labs were independently interpreted:  CBC without leukocytosis or anemia.  Metabolic panel without electrolyte disturbance.  Renal and hepatic function are normal.  COVID negative.  Independent  visualization and interpretation of imaging: I independently visualized the following imaging with scope of interpretation limited to determining acute life threatening conditions related to emergency care: CT head, which revealed no hemorrhage, lesion, mass effect, hydrocephalus, infarction or ischemia.  I agree with radiologist interpretation.    CT normal, however, patient continues to have severe head and neck pain.  Will order CTA to further investigate.  CTA with normal vasculature of the head and neck.    Treatment and Reassessment: Patient was given Toradol and Zofran while awaiting placement in room.  Patient continues to complain of severe headache.  Will give IV pain medicine for headache relief.  Patient states  she has had migraine cocktails in the past with compazine, which made her feel worse.  Patient has contraindication for Reglan due to palpitations.  Patient also given repeat dose of Zofran as she experienced some nausea.    Patient continues to have nausea, now with vomiting.  She reports feeling very anxious.  Workup has been reassuring.  Patient given Phenergan for nausea/vomiting.  Patient will also be given Ativan, IV fluids, and Excedrin Migraine then reevaluated.    Patient reports her pain has significantly improved, although a mild headache is still present.  She has been able to eat and drink normally.    Other treatment options considered:   Migraine cocktail was considered, however, patient states these have not worked for her in the past and often make her feel worse.   Consultations Obtained: I requested consultation with on-call neurology provider and spoke with Dr. Wilford Corner who recommended 10 mg IV decadron and MRI if headache does not begin to improve with IV fluids and medications due to her having new headache symptoms.  He also recommended considering spinal tap, but imaging first.  Dr. Wilford Corner requested to be re-consulted prior to any further testing/imaging.     Disposition:   Will send phenergan to patient's pharmacy to help with nausea and vomiting.  Recommended patient continue taking Excedrin Migraine or ibuprofen for headache pain, but not to overuse these medications as it may lead to rebound headache.  Advised patient to follow up with neurology.  The patient has been appropriately medically screened and/or stabilized in the ED. I have low suspicion for any other emergent medical condition which would require further screening, evaluation or treatment in the ED or require inpatient management. At time of discharge the patient is hemodynamically stable and in no acute distress. I have discussed work-up results and diagnosis with patient and answered all questions. Patient is agreeable with discharge plan. We discussed strict return precautions for returning to the emergency department and they verbalized understanding.            Final Clinical Impression(s) / ED Diagnoses Final diagnoses:  None    Rx / DC Orders ED Discharge Orders     None         Lenard Simmer, PA-C 01/26/23 2216    Rondel Baton, MD 01/27/23 1222

## 2023-01-28 ENCOUNTER — Other Ambulatory Visit: Payer: Self-pay | Admitting: Physician Assistant

## 2023-01-28 ENCOUNTER — Telehealth: Payer: Self-pay | Admitting: Physician Assistant

## 2023-01-28 MED ORDER — CLONAZEPAM 0.5 MG PO TABS: 0.5000 mg | ORAL_TABLET | Freq: Three times a day (TID) | ORAL | 0 refills | Status: DC | PRN

## 2023-01-28 NOTE — Telephone Encounter (Signed)
Prescription sent

## 2023-01-28 NOTE — Telephone Encounter (Signed)
Pt called and said that she needs a refill on her klonopin 0.5 mg. Pharmacy is [facility]  in [place]. Next appt 6/24

## 2023-02-02 NOTE — Telephone Encounter (Signed)
Forwarding to Dr. Corey to advise.  

## 2023-02-04 ENCOUNTER — Ambulatory Visit: Payer: Managed Care, Other (non HMO) | Admitting: Physician Assistant

## 2023-02-09 ENCOUNTER — Encounter: Payer: Managed Care, Other (non HMO) | Admitting: Certified Nurse Midwife

## 2023-02-22 ENCOUNTER — Telehealth (INDEPENDENT_AMBULATORY_CARE_PROVIDER_SITE_OTHER): Payer: Self-pay | Admitting: Physician Assistant

## 2023-02-22 ENCOUNTER — Encounter: Payer: Self-pay | Admitting: Physician Assistant

## 2023-02-22 DIAGNOSIS — F99 Mental disorder, not otherwise specified: Secondary | ICD-10-CM

## 2023-02-22 DIAGNOSIS — F5105 Insomnia due to other mental disorder: Secondary | ICD-10-CM

## 2023-02-22 DIAGNOSIS — F4323 Adjustment disorder with mixed anxiety and depressed mood: Secondary | ICD-10-CM

## 2023-02-22 NOTE — Progress Notes (Signed)
[facility] Med Check  Patient ID: Brandy [name],  MRN: [id number]  PCP: [place], Betty G, MD  Date of Evaluation: 02/22/2023 Time spent: 22 minutes  Chief Complaint:  Chief Complaint   Anxiety; Depression; Insomnia; Follow-up    Virtual Visit via Telehealth  I connected with patient by a video enabled telemedicine application  with their informed consent, and verified patient privacy and that I am speaking with the correct person using two identifiers.  I am private, in my office and the patient is at home.  I discussed the limitations, risks, security and privacy concerns of performing an evaluation and management service by video and the availability of in person appointments. I also discussed with the patient that there may be a patient responsible charge related to this service. The patient expressed understanding and agreed to proceed.   I discussed the assessment and treatment plan with the patient. The patient was provided an opportunity to ask questions and all were answered. The patient agreed with the plan and demonstrated an understanding of the instructions.   The patient was advised to call back or seek an in-person evaluation if the symptoms worsen or if the condition fails to improve as anticipated.  I provided 22 minutes of non-face-to-face time during this encounter.  HISTORY/CURRENT STATUS: 2 months overdue for appointment  Patient states she is doing some better with depression.  She is trying to figure out what is wrong with her "because nobody else can."  She has started going to a chiropractor that does upper cervical work.  She has also started drinking chamomile tea and eating healthier.  States her hormones are out of whack and she had 1 irregular menstrual cycle recently, the first 1 she has ever had, states she may have to get a hysterectomy.  No other GYN problems are reported.  Anxiety is a slight bit better.  She has decreased the Klonopin down to 1/2  pill every evening.  She wants to completely get off at some point.  She sleeps fairly well.  Patient is able to enjoy things.  Energy and motivation are good.  No extreme sadness, tearfulness, or feelings of hopelessness.  Denies any changes in concentration, making decisions, or remembering things.  Appetite has not changed.  Weight is stable.  Denies suicidal or homicidal thoughts.  Patient denies increased energy with decreased need for sleep, increased talkativeness, racing thoughts, impulsivity or risky behaviors, increased spending, increased libido, grandiosity, increased irritability or anger, paranoia, or hallucinations.  Denies syncope, seizures, numbness, tingling, tremor, tics, unsteady gait, slurred speech, confusion. Denies muscle or joint pain, stiffness, or dystonia. Denies unexplained weight loss, frequent infections, or sores that heal slowly.  No polyphagia, polydipsia, or polyuria. Denies visual changes or paresthesias.   Individual Medical History/ Review of Systems: Changes? :Yes    see HPI.   Past medications for mental health diagnoses include: Prozac, Luvox made her 'pass out,' Elavil, Ativan, Xanax, Zoloft 'revved her up,' Seroquel, Lithium for 1 pill, Buspar, Inderal,  NAC, Turmeric, Trazodone caused insomnia  Allergies: Metoclopramide, Penicillins, Erenumab-aooe, Fluvoxamine, Penicillin g benzathine, Sumatriptan, and Zoloft [sertraline hcl]  Current Medications:  Current Outpatient Medications:    buPROPion (WELLBUTRIN XL) 300 MG 24 hr tablet, TAKE 1 TABLET BY MOUTH EVERY DAY, Disp: 90 tablet, Rfl: 1   Cholecalciferol (VITAMIN D) 2000 units CAPS, Take 2,000 Units by mouth daily., Disp: , Rfl:    clonazePAM (KLONOPIN) 0.5 MG tablet, Take 1 tablet (0.5 mg total) by mouth 3 (three) times  daily as needed for anxiety., Disp: 75 tablet, Rfl: 0   esomeprazole (NEXIUM) 40 MG capsule, TAKE 1 CAPSULE (40 MG TOTAL) BY MOUTH 2 (TWO) TIMES DAILY BEFORE A MEAL., Disp: 180 capsule,  Rfl: 1   ibuprofen (ADVIL) 600 MG tablet, Take 1 tablet (600 mg total) by mouth every 6 (six) hours as needed., Disp: 30 tablet, Rfl: 0   Melatonin 3-10 MG TABS, Take by mouth., Disp: , Rfl:    mirtazapine (REMERON) 7.5 MG tablet, TAKE 1-2 TABLETS (7.5-15 MG TOTAL) BY MOUTH AT BEDTIME AS NEEDED., Disp: 180 tablet, Rfl: 1   ondansetron (ZOFRAN-ODT) 4 MG disintegrating tablet, Take 1 tablet (4 mg total) by mouth every 8 (eight) hours as needed for nausea or vomiting., Disp: 10 tablet, Rfl: 0   Probiotic Product (PROBIOTIC PO), Take 1 capsule by mouth daily., Disp: , Rfl:    promethazine (PHENERGAN) 25 MG tablet, Take 1 tablet (25 mg total) by mouth every 6 (six) hours as needed for nausea or vomiting., Disp: 15 tablet, Rfl: 0   Vitamin D, Ergocalciferol, (DRISDOL) 1.25 MG (50000 UNIT) CAPS capsule, Take 1 capsule (50,000 Units total) by mouth every 7 (seven) days., Disp: 12 capsule, Rfl: 1 Medication Side Effects: weight gain from mirtazapine.  Family Medical/ Social History: Changes? Quit her job in May.  "Did not have anyone to pick up or drop off her daughter from school" so she quit.  MENTAL HEALTH EXAM:  There were no vitals taken for this visit.There is no height or weight on file to calculate BMI.  General Appearance: Casual and Well Groomed  Eye Contact:  Good  Speech:  Normal Rate  Volume:  Normal  Mood:  Euthymic  Affect:  Congruent  Thought Process:  Goal Directed and Descriptions of Associations: Tangential  Orientation:  Full (Time, Place, and Person)  Thought Content: Logical   Suicidal Thoughts:  No  Homicidal Thoughts:  No  Memory:  WNL  Judgement:  Good  Insight:  Good  Psychomotor Activity:  Normal  Concentration:  Concentration: Good and Attention Span: Good  Recall:  Good  Fund of Knowledge: Good  Language: Good  Assets:  Financial Resources/Insurance Housing Transportation  ADL's:  Intact  Cognition: WNL  Prognosis:  Good   DIAGNOSES:    ICD-10-CM   1.  Situational mixed anxiety and depressive disorder  F43.23     2. Insomnia due to other mental disorder  F51.05    F99       Receiving Psychotherapy: No   Rockne Menghini, LCSW in the past.  RECOMMENDATIONS:  PDMP was reviewed.  Last Klonopin filled 01/28/2023.  I provided 22  minutes of non-face-to-face time during this encounter, including time spent before and after the visit in records review, medical decision making, counseling pertinent to today's visit, and charting.   We discussed the medications.  It is fine to decrease the Klonopin gradually over time, I recommend she stay at 1/2 pill (0.25 mg) daily for 3 to 4 weeks and then she may decrease down to 1/4 pill.  If she needs a refill she can let me know. No other changes will be made.  Continue Wellbutrin XL 300 mg, 1 p.o. every morning. Continue Mirtazapine 7.5 mg, 2 po q hs.prn.  Strongly recommend she get back in therapy.  Return in 3 months.   Melony Overly, PA-C

## 2023-03-11 ENCOUNTER — Telehealth: Payer: Self-pay | Admitting: Family Medicine

## 2023-03-11 DIAGNOSIS — R1013 Epigastric pain: Secondary | ICD-10-CM

## 2023-03-11 DIAGNOSIS — R110 Nausea: Secondary | ICD-10-CM

## 2023-03-11 NOTE — Telephone Encounter (Signed)
Pt was told the Laurette Schimke she was referred to cannot see her. Pt is requesting another referral to see a Gastro near her home Preferably: [facility] [address]  Dr. Jennye Boroughs [phone]  Pt is also out of her esomeprazole and is wondering if MD could send a refill until she can get a Gastro appointment? esomeprazole (NEXIUM) 40 MG capsule     Pharmacy: [id number] - [place], [place] - [address] (Ph: [phone])

## 2023-03-12 MED ORDER — ESOMEPRAZOLE MAGNESIUM 40 MG PO CPDR: 40.0000 mg | DELAYED_RELEASE_CAPSULE | Freq: Two times a day (BID) | ORAL | 0 refills | Status: DC

## 2023-03-12 NOTE — Telephone Encounter (Signed)
Refill sent in & new referral placed.

## 2023-07-01 ENCOUNTER — Other Ambulatory Visit: Payer: Self-pay

## 2023-07-01 ENCOUNTER — Telehealth: Payer: Self-pay | Admitting: Physician Assistant

## 2023-07-01 MED ORDER — CLONAZEPAM 0.5 MG PO TABS: 0.5000 mg | ORAL_TABLET | Freq: Three times a day (TID) | ORAL | 0 refills | Status: DC | PRN

## 2023-07-01 NOTE — Telephone Encounter (Signed)
pended

## 2023-07-01 NOTE — Telephone Encounter (Signed)
Patient called in for refill on Clonazepam 0.5mg . She is completely out and needs prescription sent ASAP Ph: [phone] Appt 11/6 [address] [place]

## 2023-07-07 ENCOUNTER — Ambulatory Visit (INDEPENDENT_AMBULATORY_CARE_PROVIDER_SITE_OTHER): Payer: Self-pay | Admitting: Physician Assistant

## 2023-07-07 DIAGNOSIS — Z91199 Patient's noncompliance with other medical treatment and regimen due to unspecified reason: Secondary | ICD-10-CM

## 2023-07-07 NOTE — Progress Notes (Signed)
I called pt approx 2:45, left message on VM that I had called and for her to call and r/s.   Charge

## 2023-08-02 ENCOUNTER — Telehealth: Payer: Self-pay | Admitting: Physician Assistant

## 2023-08-02 ENCOUNTER — Other Ambulatory Visit: Payer: Self-pay

## 2023-08-02 MED ORDER — CLONAZEPAM 0.5 MG PO TABS: 0.5000 mg | ORAL_TABLET | Freq: Three times a day (TID) | ORAL | 0 refills | Status: DC | PRN

## 2023-08-02 NOTE — Telephone Encounter (Signed)
Pended klonopin to requested pharmacy

## 2023-08-02 NOTE — Telephone Encounter (Signed)
Pt called and made an appt for 09/10/23. She needs a refill on her klonopin.Please call her at [phone]

## 2023-08-29 ENCOUNTER — Emergency Department (HOSPITAL_BASED_OUTPATIENT_CLINIC_OR_DEPARTMENT_OTHER)
Admission: EM | Admit: 2023-08-29 | Discharge: 2023-08-29 | Disposition: A | Discharge status: Home / Self Care | Payer: Managed Care, Other (non HMO)

## 2023-08-29 ENCOUNTER — Emergency Department (HOSPITAL_BASED_OUTPATIENT_CLINIC_OR_DEPARTMENT_OTHER): Payer: Managed Care, Other (non HMO)

## 2023-08-29 ENCOUNTER — Other Ambulatory Visit: Payer: Self-pay

## 2023-08-29 ENCOUNTER — Encounter (HOSPITAL_BASED_OUTPATIENT_CLINIC_OR_DEPARTMENT_OTHER): Payer: Self-pay | Admitting: Emergency Medicine

## 2023-08-29 DIAGNOSIS — R319 Hematuria, unspecified: Secondary | ICD-10-CM | POA: Insufficient documentation

## 2023-08-29 DIAGNOSIS — K2900 Acute gastritis without bleeding: Secondary | ICD-10-CM | POA: Diagnosis not present

## 2023-08-29 DIAGNOSIS — R1013 Epigastric pain: Secondary | ICD-10-CM | POA: Diagnosis present

## 2023-08-29 LAB — CBC
HCT: 40.2 % (ref 36.0–46.0)
Hemoglobin: 13.3 g/dL (ref 12.0–15.0)
MCH: 29.2 pg (ref 26.0–34.0)
MCHC: 33.1 g/dL (ref 30.0–36.0)
MCV: 88.2 fL (ref 80.0–100.0)
Platelets: 385 10*3/uL (ref 150–400)
RBC: 4.56 MIL/uL (ref 3.87–5.11)
RDW: 13.4 % (ref 11.5–15.5)
WBC: 7.2 10*3/uL (ref 4.0–10.5)
nRBC: 0 % (ref 0.0–0.2)

## 2023-08-29 LAB — COMPREHENSIVE METABOLIC PANEL
ALT: 21 U/L (ref 0–44)
AST: 14 U/L — ABNORMAL LOW (ref 15–41)
Albumin: 3.9 g/dL (ref 3.5–5.0)
Alkaline Phosphatase: 48 U/L (ref 38–126)
Anion gap: 6 (ref 5–15)
BUN: 12 mg/dL (ref 6–20)
CO2: 23 mmol/L (ref 22–32)
Calcium: 9 mg/dL (ref 8.9–10.3)
Chloride: 108 mmol/L (ref 98–111)
Creatinine, Ser: 0.86 mg/dL (ref 0.44–1.00)
GFR, Estimated: >60 mL/min (ref >60)
Glucose, Bld: 131 mg/dL — ABNORMAL HIGH (ref 70–99)
Potassium: 3.9 mmol/L (ref 3.5–5.1)
Sodium: 137 mmol/L (ref 135–145)
Total Bilirubin: 0.5 mg/dL (ref <1.2)
Total Protein: 7.2 g/dL (ref 6.5–8.1)

## 2023-08-29 LAB — URINALYSIS, ROUTINE W REFLEX MICROSCOPIC
Bilirubin Urine: NEGATIVE
Glucose, UA: NEGATIVE mg/dL
Ketones, ur: NEGATIVE mg/dL
Leukocytes,Ua: NEGATIVE
Nitrite: NEGATIVE
Protein, ur: NEGATIVE mg/dL
Specific Gravity, Urine: 1.025 (ref 1.005–1.030)
pH: 6 (ref 5.0–8.0)

## 2023-08-29 LAB — PREGNANCY, URINE: Preg Test, Ur: NEGATIVE

## 2023-08-29 LAB — URINALYSIS, MICROSCOPIC (REFLEX)

## 2023-08-29 LAB — LIPASE, BLOOD: Lipase: 35 U/L (ref 11–51)

## 2023-08-29 MED ORDER — LIDOCAINE 4 % EX PTCH: 1.0000 | MEDICATED_PATCH | CUTANEOUS | 0 refills | Status: AC

## 2023-08-29 MED ORDER — SUCRALFATE 1 G PO TABS
1.0000 g | ORAL_TABLET | Freq: Once | ORAL | Status: AC
  Administered 2023-08-29: 1 g via ORAL
  Filled 2023-08-29: qty 1

## 2023-08-29 MED ORDER — SUCRALFATE 1 G PO TABS: 1.0000 g | ORAL_TABLET | Freq: Three times a day (TID) | ORAL | 0 refills | Status: DC

## 2023-08-29 MED ORDER — ONDANSETRON HCL 4 MG/2ML IJ SOLN
4.0000 mg | Freq: Once | INTRAMUSCULAR | Status: AC | PRN
  Administered 2023-08-29: 4 mg via INTRAVENOUS
  Filled 2023-08-29: qty 2

## 2023-08-29 MED ORDER — ALUM & MAG HYDROXIDE-SIMETH 200-200-20 MG/5ML PO SUSP
30.0000 mL | Freq: Once | ORAL | Status: AC
  Administered 2023-08-29: 30 mL via ORAL
  Filled 2023-08-29: qty 30

## 2023-08-29 NOTE — ED Notes (Signed)
Patient transported to CT 

## 2023-08-29 NOTE — ED Notes (Signed)
ED Provider at bedside. 

## 2023-08-29 NOTE — Discharge Instructions (Addendum)
Please follow-up with your primary doctor.  Please avoid taking ibuprofen, BC Goody powders as they can worsen your abdominal pain.  We are prescribing you some Sucrafate to help with your symptoms.  Continue to take your omeprazole.  We also presenting you the number to the gastroenterology.  You may call and schedule an appointment.  Return immediately for fevers, chills, severe pain inability eat due to nausea vomiting, passout, numbness in your genital area, weakness in your lower extremities or you stop being able to void.  You may also return if develop any new or worsening symptoms that are concerning to you.

## 2023-08-29 NOTE — ED Provider Notes (Signed)
[place] [facility] AT [facility] Provider Note   CSN: [id number] Arrival date & time: 08/29/23  0536     History  Chief Complaint  Patient presents with   multiple complaints    Brandy [name] is a 37 y.o. female.  37 year old female present emergency department for epigastric abdominal pain.  Reports symptoms have been ongoing for the past 3 weeks or so.  She noted digestive issues 3 weeks ago after she fell backwards onto sandbox striking her back.  Has had some low back pain ever since.  Denies any weakness in her lower extremities, difficulty urinating, bowel or bladder incontinence.  No saddle anesthesia.  Her epigastric abdominal pain described as ache and notes that it feels better when she has something coating her stomach such as Pepto-Bismol        Home Medications Prior to Admission medications   Medication Sig Start Date End Date Taking? Authorizing Provider  lidocaine 4 % Place 1 patch onto the skin daily for 15 days. 08/29/23 09/13/23 Yes Timesha Cervantez, Harmon Dun, DO  sucralfate (CARAFATE) 1 g tablet Take 1 tablet (1 g total) by mouth 3 (three) times daily before meals for 14 days. 08/29/23 09/12/23 Yes Tetsuo Coppola, Harmon Dun, DO  buPROPion (WELLBUTRIN XL) 300 MG 24 hr tablet TAKE 1 TABLET BY MOUTH EVERY DAY 11/01/22   Melony Overly T, PA-C  Cholecalciferol (VITAMIN D) 2000 units CAPS Take 2,000 Units by mouth daily.    [provider]  clonazePAM (KLONOPIN) 0.5 MG tablet Take 1 tablet (0.5 mg total) by mouth 3 (three) times daily as needed for anxiety. 08/02/23   Cherie Ouch, PA-C  esomeprazole (NEXIUM) 40 MG capsule Take 1 capsule (40 mg total) by mouth 2 (two) times daily before a meal. 03/12/23   [place], Betty G, MD  ibuprofen (ADVIL) 600 MG tablet Take 1 tablet (600 mg total) by mouth every 6 (six) hours as needed. 07/13/22   Jacalyn Lefevre, MD  Melatonin 3-10 MG TABS Take by mouth.    [provider]  mirtazapine (REMERON) 7.5 MG  tablet TAKE 1-2 TABLETS (7.5-15 MG TOTAL) BY MOUTH AT BEDTIME AS NEEDED. 11/01/22   Melony Overly T, PA-C  ondansetron (ZOFRAN-ODT) 4 MG disintegrating tablet Take 1 tablet (4 mg total) by mouth every 8 (eight) hours as needed for nausea or vomiting. 08/04/22   Pricilla Loveless, MD  Probiotic Product (PROBIOTIC PO) Take 1 capsule by mouth daily.    [provider]  promethazine (PHENERGAN) 25 MG tablet Take 1 tablet (25 mg total) by mouth every 6 (six) hours as needed for nausea or vomiting. 01/26/23   Chestine Spore, Meghan R, PA-C  Vitamin D, Ergocalciferol, (DRISDOL) 1.25 MG (50000 UNIT) CAPS capsule Take 1 capsule (50,000 Units total) by mouth every 7 (seven) days. 02/05/22   Cherie Ouch, PA-C      Allergies    Metoclopramide, Penicillins, Erenumab-aooe, Fluvoxamine, Penicillin g benzathine, Sumatriptan, and Zoloft [sertraline hcl]    Review of Systems   Review of Systems  Physical Exam Updated Vital Signs BP 99/71   Pulse 77   Temp 98 F (36.7 C) (Oral)   Resp 16   Ht 5' 8.5" (1.74 m)   Wt 106.6 kg   LMP 08/24/2023   SpO2 100%   BMI 35.21 kg/m  Physical Exam Vitals and nursing note reviewed.  Constitutional:      General: She is not in acute distress.    Appearance: She is not toxic-appearing.  HENT:  Head: Normocephalic.     Nose: Nose normal.     Mouth/Throat:     Mouth: Mucous membranes are moist.  Eyes:     Conjunctiva/sclera: Conjunctivae normal.  Cardiovascular:     Rate and Rhythm: Normal rate and regular rhythm.  Pulmonary:     Effort: Pulmonary effort is normal.     Breath sounds: Normal breath sounds.  Abdominal:     General: Abdomen is flat. There is no distension.     Tenderness: There is no abdominal tenderness. There is no guarding or rebound.  Musculoskeletal:        General: Normal range of motion.     Comments: 5 out of 5 plantarflexion dorsiflexion.  5 out of 5 hip extension.  Normal sensation in lower extremities.  Skin:    General: Skin is  warm.     Capillary Refill: Capillary refill takes less than 2 seconds.  Neurological:     Mental Status: She is alert and oriented to person, place, and time.  Psychiatric:        Mood and Affect: Mood normal.        Behavior: Behavior normal.     ED Results / Procedures / Treatments   Labs (all labs ordered are listed, but only abnormal results are displayed) Labs Reviewed  COMPREHENSIVE METABOLIC PANEL - Abnormal; Notable for the following components:      Result Value   Glucose, Bld 131 (*)    AST 14 (*)    All other components within normal limits  URINALYSIS, ROUTINE W REFLEX MICROSCOPIC - Abnormal; Notable for the following components:   APPearance CLOUDY (*)    Hgb urine dipstick MODERATE (*)    All other components within normal limits  URINALYSIS, MICROSCOPIC (REFLEX) - Abnormal; Notable for the following components:   Bacteria, UA FEW (*)    All other components within normal limits  LIPASE, BLOOD  CBC  PREGNANCY, URINE    EKG None  Radiology CT ABDOMEN PELVIS WO CONTRAST Result Date: 08/29/2023 CLINICAL DATA:  Abdominal/flank pain, stone suspected. EXAM: CT ABDOMEN AND PELVIS WITHOUT CONTRAST TECHNIQUE: Multidetector CT imaging of the abdomen and pelvis was performed following the standard protocol without IV contrast. RADIATION DOSE REDUCTION: This exam was performed according to the departmental dose-optimization program which includes automated exposure control, adjustment of the mA and/or kV according to patient size and/or use of iterative reconstruction technique. COMPARISON:  CT scan urogram from 06/22/2023. FINDINGS: Lower chest: The lung bases are clear. No pleural effusion. The heart is normal in size. No pericardial effusion. Hepatobiliary: The liver is normal in size. Non-cirrhotic configuration. No suspicious mass. No intrahepatic or extrahepatic bile duct dilation. Gallbladder is surgically absent. Pancreas: Unremarkable. No pancreatic ductal dilatation  or surrounding inflammatory changes. Spleen: Within normal limits. No focal lesion. Adrenals/Urinary Tract: Adrenal glands are unremarkable. No suspicious renal mass. No hydronephrosis. There are at least 5, sub 5 mm nonobstructing calculi in the right kidney and at least 4, 2 mm or smaller nonobstructing calculi in the left kidney. No ureterolithiasis. Sinus cysts noted in the left kidney lower pole. Urinary bladder is under distended, precluding optimal assessment. However, no large mass or stones identified. No perivesical fat stranding. Stomach/Bowel: No disproportionate dilation of the small or large bowel loops. No evidence of abnormal bowel wall thickening or inflammatory changes. The appendix is unremarkable. Vascular/Lymphatic: No ascites or pneumoperitoneum. No abdominal or pelvic lymphadenopathy, by size criteria. No aneurysmal dilation of the major abdominal arteries. Reproductive: The  uterus is unremarkable. No large adnexal mass. Other: There is a tiny fat containing umbilical hernia. The soft tissues and abdominal wall are otherwise unremarkable. Musculoskeletal: No suspicious osseous lesions. IMPRESSION: *Multiple bilateral nonobstructing renal calculi, as described above. No ureterolithiasis or obstructive uropathy. *Multiple other nonacute observations, as described above. Electronically Signed   By: Jules Schick M.D.   On: 08/29/2023 08:41    Procedures Procedures    Medications Ordered in ED Medications  ondansetron (ZOFRAN) injection 4 mg (4 mg Intravenous Given 08/29/23 0622)  alum & mag hydroxide-simeth (MAALOX/MYLANTA) 200-200-20 MG/5ML suspension 30 mL (30 mLs Oral Given 08/29/23 0754)  sucralfate (CARAFATE) tablet 1 g (1 g Oral Given 08/29/23 0754)    ED Course/ Medical Decision Making/ A&P Clinical Course as of 08/29/23 1520  Sun Aug 29, 2023  8295 CT ABDOMEN PELVIS WO CONTRAST IMPRESSION: *Multiple bilateral nonobstructing renal calculi, as described above. No  ureterolithiasis or obstructive uropathy. *Multiple other nonacute observations, as described above.   [TY]  1519 Patient with improvement after GI cocktail and Sucre fate.  CT abdomen reassuring.  No stone or traumatic injury from her fall.  Discussed follow-up with her PCP and gastroenterology for further testing.  Patient agreeable.  Discharged stable condition.  [TY]    Clinical Course User Index [TY] Coral Spikes, DO                                 Medical Decision Making This is a well-appearing 37 year old female presenting emergency department with epigastric abdominal pain and low back pain.  She is afebrile nontachycardic hemodynamically stable.  Has benign abdominal exam.  Was given Zofran prior to my arrival, notes mild improvement.  Lab workup largely reassuring.  No fever tachycardia or leukocytosis to suggest systemic infection.  No significant metabolic derangements on her CMP.  No transaminitis to suggest hepatobiliary disease lipase is normal.  Pancreatitis unlikely.  UA with some hematuria; patient does have history of stones.  Will get CT scan to evaluate for stones as possible cause of back pain today.  In terms of her epigastric pain I suspect gastritis versus ulcer.  Will treat with GI cocktail and Sucrefate.  Amount and/or Complexity of Data Reviewed Independent Historian:     Details: Reports patient took Pepto-Bismol with some improvement External Data Reviewed: radiology.    Details: Appears she has had MRIs of the thoracic spine as well as head and C-spine which were essentially normal Labs: ordered. Radiology: ordered. Decision-making details documented in ED Course.  Risk OTC drugs. Prescription drug management.          Final Clinical Impression(s) / ED Diagnoses Final diagnoses:  Acute gastritis without hemorrhage, unspecified gastritis type    Rx / DC Orders ED Discharge Orders          Ordered    sucralfate (CARAFATE) 1 g tablet  3 times  daily before meals        08/29/23 0915    lidocaine 4 %  Every 24 hours        08/29/23 0915              Coral Spikes, DO 08/29/23 1520

## 2023-08-29 NOTE — ED Triage Notes (Signed)
Pt fell about 3 weeks ago and fell on a sand box that had a brick on it, started having pain and digestion issue. When to PCP 2 days ago was given meds. Feeling worse since. Dizziness, body aches, abdominal pain, nausea. States something doesn't feel right.

## 2023-08-30 ENCOUNTER — Telehealth: Payer: Self-pay | Admitting: Nurse Practitioner

## 2023-08-30 NOTE — Telephone Encounter (Signed)
Error

## 2023-08-30 NOTE — Telephone Encounter (Signed)
Inbound call from patient requesting to schedule appointment. Advise patient of 04/27/22 telephone call notes and advised she would have to follow up with [facility].

## 2023-09-02 ENCOUNTER — Telehealth: Payer: Self-pay | Admitting: Physician Assistant

## 2023-09-02 ENCOUNTER — Other Ambulatory Visit: Payer: Self-pay

## 2023-09-02 MED ORDER — CLONAZEPAM 0.5 MG PO TABS: 0.5000 mg | ORAL_TABLET | Freq: Three times a day (TID) | ORAL | 0 refills | Status: DC | PRN

## 2023-09-02 NOTE — Telephone Encounter (Signed)
 Request for RF Klonopin. Pt has appt 1/10 but will be out soon [id number] - [place], [place] - [address]

## 2023-09-02 NOTE — Telephone Encounter (Signed)
 Pended clonazepam 0.5 mg to rqst pharmacy.

## 2023-09-10 ENCOUNTER — Ambulatory Visit: Payer: Self-pay | Admitting: Physician Assistant

## 2023-09-10 DIAGNOSIS — Z91199 Patient's noncompliance with other medical treatment and regimen due to unspecified reason: Secondary | ICD-10-CM

## 2023-09-10 NOTE — Progress Notes (Signed)
   Complete physical exam  Patient: Brandy [name]   DOB: 06/20/1998   38 y.o. Female  MRN: [id number]  Subjective:    No chief complaint on file.   Brandy [name] is a 38 y.o. female who presents today for a complete physical exam. She reports consuming a {diet types:17450} diet. {types:19826} She generally feels {DESC; WELL/FAIRLY WELL/POORLY:18703}. She reports sleeping {DESC; WELL/FAIRLY WELL/POORLY:18703}. She {does/does not:200015} have additional problems to discuss today.    Most recent fall risk assessment:    02/25/2022   10:42 AM  Fall Risk   Falls in the past year? 0  Number falls in past yr: 0  Injury with Fall? 0  Risk for fall due to : No Fall Risks  Follow up Falls evaluation completed     Most recent depression screenings:    02/25/2022   10:42 AM 01/16/2021   10:46 AM  PHQ 2/9 Scores  PHQ - 2 Score 0 0  PHQ- 9 Score 5     {VISON DENTAL STD PSA (Optional):27386}  {History (Optional):23778}  Patient Care Team: Christen Butter, NP as PCP - General (Nurse Practitioner)   Outpatient Medications Prior to Visit  Medication Sig   fluticasone (FLONASE) 50 MCG/ACT nasal spray Place 2 sprays into both nostrils in the morning and at bedtime. After 7 days, reduce to once daily.   norgestimate-ethinyl estradiol (SPRINTEC 28) 0.25-35 MG-MCG tablet Take 1 tablet by mouth daily.   Nystatin POWD Apply liberally to affected area 2 times per day   spironolactone (ALDACTONE) 100 MG tablet Take 1 tablet (100 mg total) by mouth daily.   No facility-administered medications prior to visit.    ROS        Objective:     There were no vitals taken for this visit. {Vitals History (Optional):23777}  Physical Exam   No results found for any visits on 04/02/22. {Show previous labs (optional):23779}    Assessment & Plan:    Routine Health Maintenance and Physical Exam  Immunization History  Administered Date(s) Administered   DTaP 09/03/1999, 10/30/1999,  01/08/2000, 09/23/2000, 04/08/2004   Hepatitis A 02/03/2008, 02/08/2009   Hepatitis B 06/21/1999, 07/29/1999, 01/08/2000   HiB (PRP-OMP) 09/03/1999, 10/30/1999, 01/08/2000, 09/23/2000   IPV 09/03/1999, 10/30/1999, 06/28/2000, 04/08/2004   Influenza,inj,Quad PF,6+ Mos 05/11/2014   Influenza-Unspecified 08/10/2012   MMR 06/28/2001, 04/08/2004   Meningococcal Polysaccharide 02/08/2012   Pneumococcal Conjugate-13 09/23/2000   Pneumococcal-Unspecified 01/08/2000, 03/23/2000   Tdap 02/08/2012   Varicella 06/28/2000, 02/03/2008    Health Maintenance  Topic Date Due   HIV Screening  Never done   Hepatitis C Screening  Never done   INFLUENZA VACCINE  03/31/2022   PAP-Cervical Cytology Screening  04/02/2022 (Originally 06/19/2020)   PAP SMEAR-Modifier  04/02/2022 (Originally 06/19/2020)   TETANUS/TDAP  04/02/2022 (Originally 02/07/2022)   HPV VACCINES  Discontinued   COVID-19 Vaccine  Discontinued    Discussed health benefits of physical activity, and encouraged her to engage in regular exercise appropriate for her age and condition.  Problem List Items Addressed This Visit   None Visit Diagnoses     Annual physical exam    -  Primary   Cervical cancer screening       Need for Tdap vaccination          No follow-ups on file.     Christen Butter, NP

## 2023-09-12 LAB — LAB REPORT - SCANNED: EGFR: 85

## 2023-09-14 ENCOUNTER — Encounter: Payer: Self-pay | Admitting: Physician Assistant

## 2023-09-14 ENCOUNTER — Ambulatory Visit: Payer: Managed Care, Other (non HMO) | Admitting: Cardiology

## 2023-09-14 ENCOUNTER — Telehealth (INDEPENDENT_AMBULATORY_CARE_PROVIDER_SITE_OTHER): Payer: Managed Care, Other (non HMO) | Admitting: Physician Assistant

## 2023-09-14 DIAGNOSIS — F5105 Insomnia due to other mental disorder: Secondary | ICD-10-CM | POA: Diagnosis not present

## 2023-09-14 DIAGNOSIS — F99 Mental disorder, not otherwise specified: Secondary | ICD-10-CM | POA: Diagnosis not present

## 2023-09-14 DIAGNOSIS — F4323 Adjustment disorder with mixed anxiety and depressed mood: Secondary | ICD-10-CM | POA: Diagnosis not present

## 2023-09-14 MED ORDER — BUPROPION HCL ER (XL) 300 MG PO TB24: 300.0000 mg | ORAL_TABLET | Freq: Every day | ORAL | 1 refills | Status: DC

## 2023-09-14 MED ORDER — CLONAZEPAM 0.5 MG PO TABS: 0.2500 mg | ORAL_TABLET | Freq: Two times a day (BID) | ORAL | 1 refills | Status: DC | PRN

## 2023-09-14 NOTE — Progress Notes (Signed)
 [facility]  Patient ID: Brandy [name],  MRN: [id number]  PCP: Jama Chow, MD  Date of Evaluation: 09/14/2023 Time spent:25 minutes  Chief Complaint:  Chief Complaint   Anxiety; Follow-up    Virtual Visit via Telehealth  I connected with patient by a video enabled telemedicine application  with their informed consent, and verified patient privacy and that I am speaking with the correct person using two identifiers.  I am private, in my office and the patient is at home.  I discussed the limitations, risks, security and privacy concerns of performing an evaluation and management service by video and the availability of in person appointments. I also discussed with the patient that there may be a patient responsible charge related to this service. The patient expressed understanding and agreed to proceed.   I discussed the assessment and treatment plan with the patient. The patient was provided an opportunity to ask questions and all were answered. The patient agreed with the plan and demonstrated an understanding of the instructions.   The patient was advised to call back or seek an in-person evaluation if the symptoms worsen or if the condition fails to improve as anticipated.  I provided 25 minutes of non-face-to-face time during this encounter.  HISTORY/CURRENT STATUS:  Anxiety has been bad, her Mom is still really sick. Heer thinks this may have been her last Christmas. Has needed the Klonopin  even more lately. No having PA but feels overwhelmed. Imrpending sense of doom, like 'what else bad things is going to happen.'   Energy and motivation are good.   No extreme sadness, tearfulness, or feelings of hopelessness. Still not working. Doesn't feel like she is able to b/c  of her mental health.  Sleeps well most of the time. ADLs and personal hygiene are normal.   Denies any changes in concentration, making decisions, or remembering things.  Appetite has not changed.   Weight is stable.   Denies suicidal or homicidal thoughts.  Patient denies increased energy with decreased need for sleep, increased talkativeness, racing thoughts, impulsivity or risky behaviors, increased spending, increased libido, grandiosity, increased irritability or anger, paranoia, or hallucinations.  Denies syncope, seizures, numbness, tingling, tremor, tics, unsteady gait, slurred speech, confusion. Denies muscle or joint pain, stiffness, or dystonia. Denies unexplained weight loss, frequent infections, or sores that heal slowly.  No polyphagia, polydipsia, or polyuria. Denies visual changes or paresthesias.   Individual Medical History/ Review of Systems: Changes? :Yes    fell a few weeks ago, hurt her back, was given a steroid injection which has caused increased anxiety, insomnia.   Past medications for mental health diagnoses include: Prozac , Luvox made her 'pass out,' Elavil, Ativan , Xanax , Zoloft  'revved her up,' Seroquel, Lithium for 1 pill, Buspar, Inderal ,  NAC, Turmeric, Trazodone  caused insomnia  Allergies: Metoclopramide , Penicillins, Azithromycin , Erenumab -aooe, Fluvoxamine, Kenalog  [triamcinolone ], Penicillin g benzathine, Prochlorperazine , Sumatriptan, and Zoloft  [sertraline  hcl]  Current Medications:  Current Outpatient Medications:    Cholecalciferol (VITAMIN D ) 2000 units CAPS, Take 2,000 Units by mouth daily., Disp: , Rfl:    ibuprofen  (ADVIL ) 600 MG tablet, Take 1 tablet (600 mg total) by mouth every 6 (six) hours as needed., Disp: 30 tablet, Rfl: 0   Melatonin 3-10 MG TABS, Take by mouth., Disp: , Rfl:    mirtazapine  (REMERON ) 7.5 MG tablet, TAKE 1-2 TABLETS (7.5-15 MG TOTAL) BY MOUTH AT BEDTIME AS NEEDED., Disp: 180 tablet, Rfl: 1   ondansetron  (ZOFRAN -ODT) 4 MG disintegrating tablet, Take 1 tablet (4 mg total) by  mouth every 8 (eight) hours as needed for nausea or vomiting., Disp: 10 tablet, Rfl: 0   Probiotic Product (PROBIOTIC PO), Take 1 capsule by mouth  daily., Disp: , Rfl:    promethazine  (PHENERGAN ) 25 MG tablet, Take 1 tablet (25 mg total) by mouth every 6 (six) hours as needed for nausea or vomiting., Disp: 15 tablet, Rfl: 0   buPROPion  (WELLBUTRIN  XL) 300 MG 24 hr tablet, Take 1 tablet (300 mg total) by mouth daily., Disp: 90 tablet, Rfl: 1   clonazePAM  (KLONOPIN ) 0.5 MG tablet, Take 0.5-1 tablets (0.25-0.5 mg total) by mouth 2 (two) times daily as needed for anxiety., Disp: 60 tablet, Rfl: 1 Medication Side Effects: weight gain from mirtazapine .  Family Medical/ Social History: Changes?  Her Mom is still very sick. That's been very stressful.   MENTAL HEALTH EXAM:  Last menstrual period 08/24/2023.There is no height or weight on file to calculate BMI.  General Appearance: Casual and Well Groomed  Eye Contact:  Good  Speech:  Clear and Coherent and Normal Rate  Volume:  Normal  Mood:  Euthymic  Affect:  Congruent  Thought Process:  Goal Directed and Descriptions of Associations: Tangential  Orientation:  Full (Time, Place, and Person)  Thought Content: Logical   Suicidal Thoughts:  No  Homicidal Thoughts:  No  Memory:  WNL  Judgement:  Good  Insight:  Good  Psychomotor Activity:  Normal  Concentration:  Concentration: Good and Attention Span: Good  Recall:  Good  Fund of Knowledge: Good  Language: Good  Assets:  Desire for Improvement Financial Resources/Insurance Housing Transportation  ADL's:  Intact  Cognition: WNL  Prognosis:  Good   DIAGNOSES:    ICD-10-CM   1. Situational mixed anxiety and depressive disorder  F43.23     2. Insomnia due to other mental disorder  F51.05    F99       Receiving Psychotherapy: No   Marval Bunde, LCSW in the past.  RECOMMENDATIONS:  PDMP was reviewed.  Last Klonopin  filled 09/02/2023..  I provided 25 minutes of non-face-to-face time during this encounter, including time spent before and after the visit in records review, medical decision making, counseling pertinent to today's  visit, and charting.   Long discussion about her sx. Strongly recommend she get a job, which will help take her mind off things that make her anxious.Therapy is imperative.   Discussed compliance with appointments and the large balance she has.   Continue Wellbutrin  XL 300 mg, 1 p.o. every morning. Continue Klonopin  0.5 mg, 1/2-1 bid prn.  Continue Mirtazapine  7.5 mg, 2 po q hs.prn.  Strongly recommend she get back in therapy.  Return in 2 months.   Verneita Cooks, PA-C

## 2023-09-16 ENCOUNTER — Encounter: Payer: Self-pay | Admitting: *Deleted

## 2023-09-16 DIAGNOSIS — F0781 Postconcussional syndrome: Secondary | ICD-10-CM | POA: Insufficient documentation

## 2023-09-16 NOTE — Progress Notes (Signed)
Cardiology Office Note:  .   Date:  09/17/2023  ID:  Brandy [name], DOB 1985-09-25, MRN [id number] PCP: Brandy Curia, MD  [facility] Providers Cardiologist:  Brandy Ripple, DO    History of Present Illness: .   Brandy [name] is a 38 y.o. female with a past medical history of PVCs, anxiety, history of pituitary adenoma, fatigue, headaches.  07/02/2021 coronary CTA calcium score of 0 11/01/2020 echo EF 55 to 60%, no valvular abnormalities 06/23/2019 calcium score of 0 06/22/2019 monitor average heart rate 84 bpm, predominant rhythm was sinus, 7 triggered events, 2 were associated with VE  Most recently she was evaluated by Dr. Servando [name] on 01/14/2022, she was dealing with a high level of personal stress, concerned with elevated heart rate.  Had previously been on propranolol and metoprolol--was unable to tolerate either, she was started on low-dose Cardizem 120 mg daily.  Since she was last evaluated with our office, she has been doing well from a cardiac perspective, she has not been bothered by palpitations, dizziness as she previously had been.  Apparently 3 weeks ago she received a Kenalog shot for lower back pain following a fall, 2 days later she began developing tachycardia, orthostasis symptoms, which were initially concerning for her.  She feels like she stays well-hydrated, she drinks approximately 48 ounces of water per day.  Prior to her fall, she was working out at [employer], actively trying to lose weight.  She is very frustrated that her symptoms have reappeared.  She has followed up with her PCP, they repeated labs, also placed her on a ambulatory monitor which she is still wearing.  She denies chest pain, dyspnea, pnd, orthopnea,  v,  syncope, edema, weight gain, or early satiety.   ROS: Review of Systems  Constitutional:  Positive for malaise/fatigue and weight loss.  Cardiovascular:  Positive for palpitations.  Neurological:  Positive for dizziness.  All other systems reviewed and  are negative.    Studies Reviewed: Marland Kitchen   EKG Interpretation Date/Time:  Friday September 17 2023 14:54:11 EST Ventricular Rate:  90 PR Interval:  138 QRS Duration:  74 QT Interval:  344 QTC Calculation: 420 R Axis:   15  Text Interpretation: Normal sinus rhythm Nonspecific T wave abnormality When compared with ECG of 04-Aug-2022 17:34, Borderline criteria for Inferior infarct are no longer Present Confirmed by Brandy [name] [phone]) on 09/17/2023 4:23:25 PM   Cardiac Studies & Procedures      ECHOCARDIOGRAM  ECHOCARDIOGRAM COMPLETE 11/01/2020  Narrative ECHOCARDIOGRAM REPORT    Patient Name:   Brandy [name] Date of Exam: 11/01/2020 Medical Rec #:  [id number]      Height:       69.0 in Accession #:    [id number]     Weight:       243.4 lb Date of Birth:  1986/04/20       BSA:          2.246 m Patient Age:    34 years       BP:           118/78 mmHg Patient Gender: F              HR:           64 bpm. Exam Location:  [place]  Procedure: 2D Echo  Indications:    PVC (premature ventricular contraction) [I49.3 (ICD-10-CM)],  History:        Patient has prior history of Echocardiogram examinations, most recent 08/30/2019. COVID-19  virus infection; Signs/Symptoms:Dyspnea and Anxiety, Obesity.  Sonographer:    Louie Boston Referring Phys: [id number] KARDIE TOBB  IMPRESSIONS   1. Left ventricular ejection fraction, by estimation, is 55 to 60%. The left ventricle has normal function. The left ventricle has no regional wall motion abnormalities. Left ventricular diastolic parameters were normal. 2. Right ventricular systolic function is normal. The right ventricular size is normal. There is normal pulmonary artery systolic pressure. 3. The mitral valve is normal in structure. No evidence of mitral valve regurgitation. No evidence of mitral stenosis. 4. The aortic valve is normal in structure. Aortic valve regurgitation is not visualized. No aortic stenosis is present. 5. The inferior vena  cava is normal in size with greater than 50% respiratory variability, suggesting right atrial pressure of 3 mmHg.  FINDINGS Left Ventricle: Left ventricular ejection fraction, by estimation, is 55 to 60%. The left ventricle has normal function. The left ventricle has no regional wall motion abnormalities. The left ventricular internal cavity size was normal in size. There is no left ventricular hypertrophy. Left ventricular diastolic parameters were normal.  Right Ventricle: The right ventricular size is normal. No increase in right ventricular wall thickness. Right ventricular systolic function is normal. There is normal pulmonary artery systolic pressure. The tricuspid regurgitant velocity is 2.17 m/s, and with an assumed right atrial pressure of 3 mmHg, the estimated right ventricular systolic pressure is 21.8 mmHg.  Left Atrium: Left atrial size was normal in size.  Right Atrium: Right atrial size was normal in size.  Pericardium: There is no evidence of pericardial effusion.  Mitral Valve: The mitral valve is normal in structure. No evidence of mitral valve regurgitation. No evidence of mitral valve stenosis.  Tricuspid Valve: The tricuspid valve is normal in structure. Tricuspid valve regurgitation is trivial. No evidence of tricuspid stenosis.  Aortic Valve: The aortic valve is normal in structure. Aortic valve regurgitation is not visualized. No aortic stenosis is present.  Pulmonic Valve: The pulmonic valve was normal in structure. Pulmonic valve regurgitation is not visualized. No evidence of pulmonic stenosis.  Aorta: The aortic root is normal in size and structure.  Venous: The inferior vena cava is normal in size with greater than 50% respiratory variability, suggesting right atrial pressure of 3 mmHg.  IAS/Shunts: No atrial level shunt detected by color flow Doppler.   LEFT VENTRICLE PLAX 2D LVIDd:         4.30 cm     Diastology LVIDs:         2.50 cm     LV e' medial:     11.30 cm/s LV PW:         1.00 cm     LV E/e' medial:  6.9 LV IVS:        1.10 cm     LV e' lateral:   18.50 cm/s LVOT diam:     2.00 cm     LV E/e' lateral: 4.2 LV SV:         53 LV SV Index:   24 LVOT Area:     3.14 cm  LV Volumes (MOD) LV vol d, MOD A2C: 55.3 ml LV vol d, MOD A4C: 57.5 ml LV vol s, MOD A2C: 20.6 ml LV vol s, MOD A4C: 24.6 ml LV SV MOD A2C:     34.7 ml LV SV MOD A4C:     57.5 ml LV SV MOD BP:      34.2 ml  RIGHT VENTRICLE RV S prime:  12.20 cm/s TAPSE (M-mode): 2.6 cm  LEFT ATRIUM             Index       RIGHT ATRIUM           Index LA diam:        3.30 cm 1.47 cm/m  RA Area:     11.20 cm LA Vol (A2C):   27.5 ml 12.24 ml/m RA Volume:   22.20 ml  9.88 ml/m LA Vol (A4C):   22.3 ml 9.93 ml/m LA Biplane Vol: 25.4 ml 11.31 ml/m AORTIC VALVE LVOT Vmax:   79.60 cm/s LVOT Vmean:  53.300 cm/s LVOT VTI:    0.170 m  AORTA Ao Root diam: 2.70 cm Ao Asc diam:  2.80 cm Ao Desc diam: 2.10 cm  MITRAL VALVE               TRICUSPID VALVE MV Area (PHT): 2.87 cm    TR Peak grad:   18.8 mmHg MV Decel Time: 264 msec    TR Vmax:        217.00 cm/s MV E velocity: 78.40 cm/s MV A velocity: 61.70 cm/s  SHUNTS MV E/A ratio:  1.27        Systemic VTI:  0.17 m Systemic Diam: 2.00 cm  Kardie Tobb DO Electronically signed by Brandy Ripple DO Signature Date/Time: 11/01/2020/3:54:41 PM    Final   MONITORS  LONG TERM MONITOR (3-14 DAYS) 06/22/2019  Narrative The patient wore the monitor for 7 days, starting 06/22/2019. Indication: Palpitations The minimum heart rate was 51 bpm, maximum heart rate was 175 bpm, and average heart rate 84 bpm. The predominant underlying rhythm was Sinus Rhythm. Premature atrial complexes were rare (<1.0%). Premature ventricular complexes were rare (<1.0%). There were 7 patient triggered event: 2 was associated with ventricular ectopy, 5 was associated with sinus rhythm. No ventricular tachycardia, No AV block, No pause, no  supraventricular tachycardia and no atrial fibrillation was present.  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  CT SCANS  CT CORONARY MORPH W/CTA COR W/SCORE 07/02/2021  Addendum 07/02/2021  3:51 PM ADDENDUM REPORT: 07/02/2021 15:49  HISTORY: 38 yo female with chest pain, nonspecific  EXAM: Cardiac/Coronary CTA  TECHNIQUE: The patient was scanned on a [employer].  PROTOCOL: A 110 kV retrospective scan was triggered in the descending thoracic aorta at 111 HU's. Axial non-contrast 3 mm slices were carried out through the heart. The data set was analyzed on a dedicated work station and scored using the Agatson method. Gantry rotation speed was 250 msecs and collimation was .6 mm. Beta blockade and 0.8 mg of sl NTG was given. The 3D data set was reconstructed in 10% intervals of the 10-90 % of the R-R cycle. Diastolic phases were analyzed on a dedicated work station using MPR, MIP and VRT modes. The patient received 95mL OMNIPAQUE IOHEXOL 350 MG/ML SOLN of contrast.  FINDINGS: Quality: Good, HR 64  Coronary calcium score: The patient's coronary artery calcium score is 0, which places the patient in the 0 percentile.  Coronary arteries: Normal coronary origins.  Right dominance.  Right Coronary Artery: Dominant.  Normal artery.  Left Main Coronary Artery: Normal. Bifurcates into the LAD and LCx arteries.  Left Anterior Descending Coronary Artery: Anterior vessel that reaches the apex. No disease.  Left Circumflex Artery: AV groove vessel. No significant proximal to mid vessel disease. The distal vessel is not well-visualized.  Aorta: Normal size, 25 mm at the mid ascending  aorta (level of the PA bifurcation) measured double oblique. No calcifications. No dissection.  Aortic Valve: Trileaflet. No calcifications.  Other findings:  Normal pulmonary vein drainage into the left atrium.  Normal left atrial appendage without a  thrombus.  Normal size of the pulmonary artery.  PFO with left to right flow is noted - see cines on PACS  IMPRESSION: 1. No evidence of CAD, CADRADS = 0.  2. Coronary calcium score of 0. This was 0 percentile for age and sex matched control.  3. Normal coronary origin with right dominance.  4. PFO with left to right contrast flow noted on cine imaging.  5. Consider non-coronary causes of chest pain   Electronically Signed By: Chrystie Nose M.D. On: 07/02/2021 15:49  Narrative EXAM: OVER-READ INTERPRETATION  CT CHEST  The following report is an over-read performed by radiologist Dr. Leanna Battles of [facility] on 07/02/2021. This over-read does not include interpretation of cardiac or coronary anatomy or pathology. The coronary calcium score/coronary CTA interpretation by the cardiologist is attached.  COMPARISON:  CT chest 04/04/2020.  FINDINGS: Vascular: None.  Mediastinum/Nodes: None.  Lungs/Pleura: Mild dependent atelectasis bilaterally. Lungs are otherwise clear. No pleural fluid.  Upper Abdomen: None.  Musculoskeletal: None.  IMPRESSION: No acute extracardiac findings.  Electronically Signed: By: Leanna Battles M.D. On: 07/02/2021 12:43   CT SCANS  CT CARDIAC SCORING (SELF PAY ONLY) 06/23/2019  Addendum 06/23/2019 12:32 PM ADDENDUM REPORT: 06/23/2019 12:30  CLINICAL DATA:  Risk stratification  EXAM: Coronary Calcium Score  TECHNIQUE: The patient was scanned on a [employer]. Axial non-contrast 3 mm slices were carried out through the heart. The data set was analyzed on a dedicated work station and scored using the Agatson method.  FINDINGS: Non-cardiac: See separate report from [facility].  Ascending Aorta: Normal size, no calcifications.  Pericardium: Normal.  Coronary arteries: Normal origin.  IMPRESSION: Coronary calcium score of 0. This was 0 percentile for age and sex matched  control.   Electronically Signed By: Tobias Alexander On: 06/23/2019 12:30  Narrative EXAM: OVER-READ INTERPRETATION  CT CHEST  The following report is an over-read performed by radiologist Dr. Maryelizabeth Rowan [facility] on 06/23/2019. This over-read does not include interpretation of cardiac or coronary anatomy or pathology. The coronary calcium score interpretation by the cardiologist is attached.  COMPARISON:  None.  FINDINGS: Limited view of the lung parenchyma demonstrates a pleural-based 3 mm nodule in the LEFT lower lobe (image 33/3) which was present on CT 04/20/2018. Airways are normal.  Limited view of the mediastinum demonstrates no adenopathy. Esophagus normal.  Limited view of the upper abdomen unremarkable.  Limited view of the skeleton and chest wall is unremarkable.  IMPRESSION: 1. No significant extracardiac findings. 2. Stable LEFT lobe pulmonary nodule.  Electronically Signed: By: Genevive Bi M.D. On: 06/23/2019 11:47          Risk Assessment/Calculations:             Physical Exam:   VS:  BP 110/80 (BP Location: Left Arm, Patient Position: Sitting, Cuff Size: Normal)   Pulse 90   Ht 5' 8.5" (1.74 m)   Wt 229 lb (103.9 kg)   LMP 08/24/2023   SpO2 98%   BMI 34.31 kg/m    Wt Readings from Last 3 Encounters:  09/17/23 229 lb (103.9 kg)  08/29/23 235 lb (106.6 kg)  01/26/23 245 lb (111.1 kg)    GEN: Well nourished, well developed in no acute  distress NECK: No JVD; No carotid bruits CARDIAC: RRR, no murmurs, rubs, gallops RESPIRATORY:  Clear to auscultation without rales, wheezing or rhonchi  ABDOMEN: Soft, non-tender, non-distended EXTREMITIES:  No edema; No deformity   ASSESSMENT AND PLAN: .   Palpitations-currently wearing a monitor that was arranged by her PCP, when she is finished wearing it, she will ask her PCP to send [place] copy of this discharge PCP is also completed recent lab work including thyroid panel  which was unrevealing.  She does also have Kardia mobile at home, has been checking her EKG.  She brings up a concerning episode on her phone revealing atrial fibrillation however it was artifact and not atrial fibrillation.  She avoids triggers.  She has previously been on metoprolol, propranolol, Cardizem, flecainide--all have caused hypotension and she is not able to tolerate them.  We did talk about Corlanor, she does not want to start anything today however will do some research on her own and we can discuss it at her next OV.  Dysautonomia-her symptoms appear to be consistent with dysautonomia, it is interesting that she had a few years that she was symptom-free, she has recently had a weight loss of approximately 30 pounds, also with a recent Kenalog injection.  While she continues to wear her monitor, we will have her increase her water intake, increase her sodium intake, wear compression socks, see if this helps with any of her symptoms.  Hopefully as the Kenalog injection wears off--which it should be as it was 3 weeks ago, her symptoms will subside.   DOE-this has been concerning for her, she had an echocardiogram in 2022 which was unrevealing however will repeat echocardiogram to evaluate for any structural changes.         Dispo: Increase water intake, increase sodium intake, echocardiogram, compression socks, follow-up in 4 weeks.  Signed, Flossie Dibble, NP

## 2023-09-17 ENCOUNTER — Encounter: Payer: Self-pay | Admitting: Cardiology

## 2023-09-17 ENCOUNTER — Ambulatory Visit: Payer: Managed Care, Other (non HMO) | Attending: Cardiology | Admitting: Cardiology

## 2023-09-17 VITALS — BP 110/80 | HR 90 | Ht 68.5 in | Wt 229.0 lb

## 2023-09-17 DIAGNOSIS — R0609 Other forms of dyspnea: Secondary | ICD-10-CM

## 2023-09-17 DIAGNOSIS — I493 Ventricular premature depolarization: Secondary | ICD-10-CM

## 2023-09-17 DIAGNOSIS — R002 Palpitations: Secondary | ICD-10-CM | POA: Diagnosis not present

## 2023-09-17 DIAGNOSIS — G901 Familial dysautonomia [Riley-Day]: Secondary | ICD-10-CM | POA: Diagnosis not present

## 2023-09-17 NOTE — Patient Instructions (Signed)
Medication Instructions:  Your physician recommends that you continue on your current medications as directed. Please refer to the Current Medication list given to you today.  *If you need a refill on your cardiac medications before your next appointment, please call your pharmacy*   Lab Work: NONE If you have labs (blood work) drawn today and your tests are completely normal, you will receive your results only by: MyChart Message (if you have MyChart) OR A paper copy in the mail If you have any lab test that is abnormal or we need to change your treatment, we will call you to review the results.   Testing/Procedures: Your physician has requested that you have an echocardiogram. Echocardiography is a painless test that uses sound waves to create images of your heart. It provides your doctor with information about the size and shape of your heart and how well your heart's chambers and valves are working. This procedure takes approximately one hour. There are no restrictions for this procedure. Please do NOT wear cologne, perfume, aftershave, or lotions (deodorant is allowed). Please arrive 15 minutes prior to your appointment time.  Please note: We ask at that you not bring children with you during ultrasound (echo/ vascular) testing. Due to room size and safety concerns, children are not allowed in the ultrasound rooms during exams. Our front office staff cannot provide observation of children in our lobby area while testing is being conducted. An adult accompanying a patient to their appointment will only be allowed in the ultrasound room at the discretion of the ultrasound technician under special circumstances. We apologize for any inconvenience.    Follow-Up: At [facility], you and your health needs are our priority.  As part of our continuing mission to provide you with exceptional heart care, we have created designated Provider Care Teams.  These Care Teams include your  primary Cardiologist (physician) and Advanced Practice Providers (APPs -  Physician Assistants and Nurse Practitioners) who all work together to provide you with the care you need, when you need it.  We recommend signing up for the patient portal called "MyChart".  Sign up information is provided on this After Visit Summary.  MyChart is used to connect with patients for Virtual Visits (Telemedicine).  Patients are able to view lab/test results, encounter notes, upcoming appointments, etc.  Non-urgent messages can be sent to your provider as well.   To learn more about what you can do with MyChart, go to [email or web address].    Your next appointment:   4 week(s)  Provider:   Wallis Bamberg, NP Rosalita Levan)    Other Instructions Look up Corlanor Look up Dysautonomia Increase Water intake to 2-3 Liters per day Increase Salt to 3-4 grams daily  Purchase Compression Socks

## 2023-10-05 ENCOUNTER — Ambulatory Visit: Payer: Managed Care, Other (non HMO) | Attending: Cardiology

## 2023-10-05 DIAGNOSIS — R0609 Other forms of dyspnea: Secondary | ICD-10-CM

## 2023-10-05 LAB — ECHOCARDIOGRAM COMPLETE: S' Lateral: 2.3 cm

## 2023-10-13 ENCOUNTER — Telehealth: Payer: Self-pay | Admitting: Emergency Medicine

## 2023-10-13 NOTE — Telephone Encounter (Signed)
LVM 2/12

## 2023-10-13 NOTE — Telephone Encounter (Signed)
-----   Message from Flossie Dibble sent at 10/06/2023  9:39 AM EST ----- Echo showed that your heart is squeezing well and relaxing well.  Your valves are opening and closing normally.  Normal echocardiogram.

## 2023-10-14 NOTE — Progress Notes (Deleted)
 Cardiology Office Note:  .   Date:  10/14/2023  ID:  Brandy [name], DOB 09/02/1985, MRN [id number] PCP: Simone Curia, MD  [facility] Providers Cardiologist:  Thomasene Ripple, DO    History of Present Illness: .   Brandy [name] is a 38 y.o. female with a past medical history of PVCs, anxiety, history of pituitary adenoma, fatigue, headaches.  10/05/2023 echo EF 60 to 65%, no valvular abnormalities 07/02/2021 coronary CTA calcium score of 0 11/01/2020 echo EF 55 to 60%, no valvular abnormalities 06/23/2019 calcium score of 0 06/22/2019 monitor average heart rate 84 bpm, predominant rhythm was sinus, 7 triggered events, 2 were associated with VE  Most recently she was evaluated by Dr. Servando Salina on 01/14/2022, she was dealing with a high level of personal stress, concerned with elevated heart rate.  Had previously been on propranolol and metoprolol--was unable to tolerate either, she was started on low-dose Cardizem 120 mg daily.  Since she was last evaluated with our office, she has been doing well from a cardiac perspective, she has not been bothered by palpitations, dizziness as she previously had been.  Apparently 3 weeks ago she received a Kenalog shot for lower back pain following a fall, 2 days later she began developing tachycardia, orthostasis symptoms, which were initially concerning for her.  She feels like she stays well-hydrated, she drinks approximately 48 ounces of water per day.  Prior to her fall, she was working out at [employer], actively trying to lose weight.  She is very frustrated that her symptoms have reappeared.  She has followed up with her PCP, they repeated labs, also placed her on a ambulatory monitor which she is still wearing.  She denies chest pain, dyspnea, pnd, orthopnea,  v,  syncope, edema, weight gain, or early satiety.   Dispo: Increase water intake, increase sodium intake, echocardiogram, compression socks, follow-up in 4 weeks.  ROS: Review of Systems   Constitutional:  Positive for malaise/fatigue and weight loss.  Cardiovascular:  Positive for palpitations.  Neurological:  Positive for dizziness.  All other systems reviewed and are negative.    Studies Reviewed: .       Cardiac Studies & Procedures   ______________________________________________________________________________________________     ECHOCARDIOGRAM  ECHOCARDIOGRAM COMPLETE 10/05/2023  Narrative ECHOCARDIOGRAM REPORT    Patient Name:   Brandy [name] Date of Exam: 10/05/2023 Medical Rec #:  [id number]      Height:       68.5 in Accession #:    [id number]     Weight:       229.0 lb Date of Birth:  01/22/1986       BSA:          2.177 m Patient Age:    37 years       BP:           110/80 mmHg Patient Gender: F              HR:           80 bpm. Exam Location:  [place]  Procedure: 2D Echo, Cardiac Doppler, Color Doppler and Strain Analysis  Indications:    DOE (dyspnea on exertion) [R06.09 (ICD-10-CM)]  History:        Patient has prior history of Echocardiogram examinations, most recent 11/01/2020. Arrythmias:Symptomatic PVCs; Signs/Symptoms:Fatigue and Chest Pain.  Sonographer:    Louie Boston RDCS Referring Phys: [phone] Darden Dates Novali Vollman  IMPRESSIONS   1. Left ventricular ejection fraction, by estimation, is 60 to 65%.  The left ventricle has normal function. The left ventricle has no regional wall motion abnormalities. Left ventricular diastolic parameters were normal. The average left ventricular global longitudinal strain is 21.8 %. The global longitudinal strain is normal. 2. Right ventricular systolic function is normal. The right ventricular size is normal. There is normal pulmonary artery systolic pressure. 3. The mitral valve is normal in structure. No evidence of mitral valve regurgitation. No evidence of mitral stenosis. 4. The aortic valve is tricuspid. Aortic valve regurgitation is not visualized. No aortic stenosis is present. 5. The inferior vena  cava is normal in size with greater than 50% respiratory variability, suggesting right atrial pressure of 3 mmHg.  FINDINGS Left Ventricle: Left ventricular ejection fraction, by estimation, is 60 to 65%. The left ventricle has normal function. The left ventricle has no regional wall motion abnormalities. The average left ventricular global longitudinal strain is 21.8 %. The global longitudinal strain is normal. The left ventricular internal cavity size was normal in size. There is no left ventricular hypertrophy. Left ventricular diastolic parameters were normal. Normal left ventricular filling pressure.  Right Ventricle: The right ventricular size is normal. No increase in right ventricular wall thickness. Right ventricular systolic function is normal. There is normal pulmonary artery systolic pressure. The tricuspid regurgitant velocity is 2.11 m/s, and with an assumed right atrial pressure of 3 mmHg, the estimated right ventricular systolic pressure is 20.8 mmHg.  Left Atrium: Left atrial size was normal in size.  Right Atrium: Right atrial size was normal in size.  Pericardium: There is no evidence of pericardial effusion.  Mitral Valve: The mitral valve is normal in structure. No evidence of mitral valve regurgitation. No evidence of mitral valve stenosis.  Tricuspid Valve: The tricuspid valve is normal in structure. Tricuspid valve regurgitation is mild . No evidence of tricuspid stenosis.  Aortic Valve: The aortic valve is tricuspid. Aortic valve regurgitation is not visualized. No aortic stenosis is present.  Pulmonic Valve: The pulmonic valve was normal in structure. Pulmonic valve regurgitation is not visualized. No evidence of pulmonic stenosis.  Aorta: The aortic root, ascending aorta, aortic arch and descending aorta are all structurally normal, with no evidence of dilitation or obstruction.  Venous: A normal flow pattern is recorded from the right upper pulmonary vein. The  inferior vena cava is normal in size with greater than 50% respiratory variability, suggesting right atrial pressure of 3 mmHg.  IAS/Shunts: No atrial level shunt detected by color flow Doppler.   LEFT VENTRICLE PLAX 2D LVIDd:         4.30 cm   Diastology LVIDs:         2.30 cm   LV e' medial:    10.80 cm/s LV PW:         0.80 cm   LV E/e' medial:  10.0 LV IVS:        0.90 cm   LV e' lateral:   21.40 cm/s LVOT diam:     1.80 cm   LV E/e' lateral: 5.0 LV SV:         50 LV SV Index:   23        2D Longitudinal Strain LVOT Area:     2.54 cm  2D Strain GLS Avg:     21.8 %   RIGHT VENTRICLE RV Basal diam:  2.80 cm RV S prime:     15.60 cm/s TAPSE (M-mode): 2.1 cm  LEFT ATRIUM  Index        RIGHT ATRIUM           Index LA diam:        3.30 cm 1.52 cm/m   RA Area:     13.30 cm LA Vol (A2C):   46.7 ml 21.46 ml/m  RA Volume:   30.50 ml  14.01 ml/m LA Vol (A4C):   41.2 ml 18.93 ml/m LA Biplane Vol: 45.6 ml 20.95 ml/m AORTIC VALVE LVOT Vmax:   101.50 cm/s LVOT Vmean:  65.550 cm/s LVOT VTI:    0.195 m  AORTA Ao Root diam: 2.70 cm Ao Asc diam:  3.00 cm Ao Desc diam: 2.40 cm  MV E velocity: 107.50 cm/s  TRICUSPID VALVE MV A velocity: 86.20 cm/s   TR Peak grad:   17.8 mmHg MV E/A ratio:  1.25         TR Vmax:        211.00 cm/s  SHUNTS Systemic VTI:  0.20 m Systemic Diam: 1.80 cm  Norman Herrlich MD Electronically signed by Norman Herrlich MD Signature Date/Time: 10/05/2023/5:46:26 PM    Final    MONITORS  LONG TERM MONITOR (3-14 DAYS) 06/22/2019  Narrative The patient wore the monitor for 7 days, starting 06/22/2019. Indication: Palpitations The minimum heart rate was 51 bpm, maximum heart rate was 175 bpm, and average heart rate 84 bpm. The predominant underlying rhythm was Sinus Rhythm. Premature atrial complexes were rare (<1.0%). Premature ventricular complexes were rare (<1.0%). There were 7 patient triggered event: 2 was associated with ventricular  ectopy, 5 was associated with sinus rhythm. No ventricular tachycardia, No AV block, No pause, no supraventricular tachycardia and no atrial fibrillation was present.  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   CT SCANS  CT CORONARY MORPH W/CTA COR W/SCORE 07/02/2021  Addendum 07/02/2021  3:51 PM ADDENDUM REPORT: 07/02/2021 15:49  HISTORY: 38 yo female with chest pain, nonspecific  EXAM: Cardiac/Coronary CTA  TECHNIQUE: The patient was scanned on a [employer].  PROTOCOL: A 110 kV retrospective scan was triggered in the descending thoracic aorta at 111 HU's. Axial non-contrast 3 mm slices were carried out through the heart. The data set was analyzed on a dedicated work station and scored using the Agatson method. Gantry rotation speed was 250 msecs and collimation was .6 mm. Beta blockade and 0.8 mg of sl NTG was given. The 3D data set was reconstructed in 10% intervals of the 10-90 % of the R-R cycle. Diastolic phases were analyzed on a dedicated work station using MPR, MIP and VRT modes. The patient received 95mL OMNIPAQUE IOHEXOL 350 MG/ML SOLN of contrast.  FINDINGS: Quality: Good, HR 64  Coronary calcium score: The patient's coronary artery calcium score is 0, which places the patient in the 0 percentile.  Coronary arteries: Normal coronary origins.  Right dominance.  Right Coronary Artery: Dominant.  Normal artery.  Left Main Coronary Artery: Normal. Bifurcates into the LAD and LCx arteries.  Left Anterior Descending Coronary Artery: Anterior vessel that reaches the apex. No disease.  Left Circumflex Artery: AV groove vessel. No significant proximal to mid vessel disease. The distal vessel is not well-visualized.  Aorta: Normal size, 25 mm at the mid ascending aorta (level of the PA bifurcation) measured double oblique. No calcifications. No dissection.  Aortic Valve: Trileaflet. No calcifications.  Other  findings:  Normal pulmonary vein drainage into the left atrium.  Normal left atrial appendage without a thrombus.  Normal size of  the pulmonary artery.  PFO with left to right flow is noted - see cines on PACS  IMPRESSION: 1. No evidence of CAD, CADRADS = 0.  2. Coronary calcium score of 0. This was 0 percentile for age and sex matched control.  3. Normal coronary origin with right dominance.  4. PFO with left to right contrast flow noted on cine imaging.  5. Consider non-coronary causes of chest pain   Electronically Signed By: Chrystie Nose M.D. On: 07/02/2021 15:49  Narrative EXAM: OVER-READ INTERPRETATION  CT CHEST  The following report is an over-read performed by radiologist Dr. Leanna Battles of [facility] on 07/02/2021. This over-read does not include interpretation of cardiac or coronary anatomy or pathology. The coronary calcium score/coronary CTA interpretation by the cardiologist is attached.  COMPARISON:  CT chest 04/04/2020.  FINDINGS: Vascular: None.  Mediastinum/Nodes: None.  Lungs/Pleura: Mild dependent atelectasis bilaterally. Lungs are otherwise clear. No pleural fluid.  Upper Abdomen: None.  Musculoskeletal: None.  IMPRESSION: No acute extracardiac findings.  Electronically Signed: By: Leanna Battles M.D. On: 07/02/2021 12:43   CT SCANS  CT CARDIAC SCORING (SELF PAY ONLY) 06/23/2019  Addendum 06/23/2019 12:32 PM ADDENDUM REPORT: 06/23/2019 12:30  CLINICAL DATA:  Risk stratification  EXAM: Coronary Calcium Score  TECHNIQUE: The patient was scanned on a [employer]. Axial non-contrast 3 mm slices were carried out through the heart. The data set was analyzed on a dedicated work station and scored using the Agatson method.  FINDINGS: Non-cardiac: See separate report from [facility].  Ascending Aorta: Normal size, no calcifications.  Pericardium: Normal.  Coronary arteries: Normal  origin.  IMPRESSION: Coronary calcium score of 0. This was 0 percentile for age and sex matched control.   Electronically Signed By: Tobias Alexander On: 06/23/2019 12:30  Narrative EXAM: OVER-READ INTERPRETATION  CT CHEST  The following report is an over-read performed by radiologist Dr. Maryelizabeth Rowan [address] [facility] on 06/23/2019. This over-read does not include interpretation of cardiac or coronary anatomy or pathology. The coronary calcium score interpretation by the cardiologist is attached.  COMPARISON:  None.  FINDINGS: Limited view of the lung parenchyma demonstrates a pleural-based 3 mm nodule in the LEFT lower lobe (image 33/3) which was present on CT 04/20/2018. Airways are normal.  Limited view of the mediastinum demonstrates no adenopathy. Esophagus normal.  Limited view of the upper abdomen unremarkable.  Limited view of the skeleton and chest wall is unremarkable.  IMPRESSION: 1. No significant extracardiac findings. 2. Stable LEFT lobe pulmonary nodule.  Electronically Signed: By: Genevive Bi M.D. On: 06/23/2019 11:47     ______________________________________________________________________________________________      Risk Assessment/Calculations:     No BP recorded.  {Refresh Note OR Click here to enter BP  :1}***       Physical Exam:   VS:  There were no vitals taken for this visit.   Wt Readings from Last 3 Encounters:  09/17/23 229 lb (103.9 kg)  08/29/23 235 lb (106.6 kg)  01/26/23 245 lb (111.1 kg)    GEN: Well nourished, well developed in no acute distress NECK: No JVD; No carotid bruits CARDIAC: RRR, no murmurs, rubs, gallops RESPIRATORY:  Clear to auscultation without rales, wheezing or rhonchi  ABDOMEN: Soft, non-tender, non-distended EXTREMITIES:  No edema; No deformity   ASSESSMENT AND PLAN: .   Palpitations-currently wearing a monitor that was arranged by her PCP, when she is finished wearing it, she  will ask her PCP to send [place]  copy of this discharge PCP is also completed recent lab work including thyroid panel which was unrevealing.  She does also have [employer]ia mobile at home, has been checking her EKG.  She brings up a concerning episode on her phone revealing atrial fibrillation however it was artifact and not atrial fibrillation.  She avoids triggers.  She has previously been on metoprolol, propranolol, Cardizem, flecainide--all have caused hypotension and she is not able to tolerate them.  We did talk about Corlanor, she does not want to start anything today however will do some research on her own and we can discuss it at her next OV.  Dysautonomia-her symptoms appear to be consistent with dysautonomia, it is interesting that she had a few years that she was symptom-free, she has recently had a weight loss of approximately 30 pounds, also with a recent Kenalog injection.  While she continues to wear her monitor, we will have her increase her water intake, increase her sodium intake, wear compression socks, see if this helps with any of her symptoms.  Hopefully as the Kenalog injection wears off--which it should be as it was 3 weeks ago, her symptoms will subside.   DOE-this has been concerning for her, she had an echocardiogram in 2022 which was unrevealing however will repeat echocardiogram to evaluate for any structural changes.         Dispo: Increase water intake, increase sodium intake, echocardiogram, compression socks, follow-up in 4 weeks.  Signed, Flossie Dibble, NP

## 2023-10-15 ENCOUNTER — Ambulatory Visit: Payer: Managed Care, Other (non HMO) | Attending: Cardiology | Admitting: Cardiology

## 2023-10-15 DIAGNOSIS — I493 Ventricular premature depolarization: Secondary | ICD-10-CM

## 2023-10-15 DIAGNOSIS — G901 Familial dysautonomia [Riley-Day]: Secondary | ICD-10-CM

## 2023-10-15 DIAGNOSIS — R002 Palpitations: Secondary | ICD-10-CM

## 2023-10-27 ENCOUNTER — Encounter (HOSPITAL_BASED_OUTPATIENT_CLINIC_OR_DEPARTMENT_OTHER): Payer: Self-pay | Admitting: Emergency Medicine

## 2023-10-27 ENCOUNTER — Emergency Department (HOSPITAL_BASED_OUTPATIENT_CLINIC_OR_DEPARTMENT_OTHER): Payer: Managed Care, Other (non HMO)

## 2023-10-27 ENCOUNTER — Emergency Department (HOSPITAL_BASED_OUTPATIENT_CLINIC_OR_DEPARTMENT_OTHER)
Admission: EM | Admit: 2023-10-27 | Discharge: 2023-10-27 | Disposition: A | Discharge status: Home / Self Care | Payer: Managed Care, Other (non HMO) | Attending: Emergency Medicine | Admitting: Emergency Medicine

## 2023-10-27 DIAGNOSIS — J3489 Other specified disorders of nose and nasal sinuses: Secondary | ICD-10-CM | POA: Insufficient documentation

## 2023-10-27 DIAGNOSIS — R519 Headache, unspecified: Secondary | ICD-10-CM | POA: Insufficient documentation

## 2023-10-27 DIAGNOSIS — Z8616 Personal history of COVID-19: Secondary | ICD-10-CM | POA: Insufficient documentation

## 2023-10-27 DIAGNOSIS — R110 Nausea: Secondary | ICD-10-CM | POA: Insufficient documentation

## 2023-10-27 LAB — CBC WITH DIFFERENTIAL/PLATELET
Abs Immature Granulocytes: 0.04 10*3/uL (ref 0.00–0.07)
Basophils Absolute: 0 10*3/uL (ref 0.0–0.1)
Basophils Relative: 0 %
Eosinophils Absolute: 0 10*3/uL (ref 0.0–0.5)
Eosinophils Relative: 0 %
HCT: 39.1 % (ref 36.0–46.0)
Hemoglobin: 12.9 g/dL (ref 12.0–15.0)
Immature Granulocytes: 0 %
Lymphocytes Relative: 16 %
Lymphs Abs: 1.5 10*3/uL (ref 0.7–4.0)
MCH: 29.2 pg (ref 26.0–34.0)
MCHC: 33 g/dL (ref 30.0–36.0)
MCV: 88.5 fL (ref 80.0–100.0)
Monocytes Absolute: 0.7 10*3/uL (ref 0.1–1.0)
Monocytes Relative: 7 %
Neutro Abs: 7.3 10*3/uL (ref 1.7–7.7)
Neutrophils Relative %: 77 %
Platelets: 354 10*3/uL (ref 150–400)
RBC: 4.42 MIL/uL (ref 3.87–5.11)
RDW: 13.4 % (ref 11.5–15.5)
WBC: 9.5 10*3/uL (ref 4.0–10.5)
nRBC: 0 % (ref 0.0–0.2)

## 2023-10-27 LAB — BASIC METABOLIC PANEL
Anion gap: 13 (ref 5–15)
BUN: 8 mg/dL (ref 6–20)
CO2: 20 mmol/L — ABNORMAL LOW (ref 22–32)
Calcium: 9 mg/dL (ref 8.9–10.3)
Chloride: 102 mmol/L (ref 98–111)
Creatinine, Ser: 0.87 mg/dL (ref 0.44–1.00)
GFR, Estimated: >60 mL/min (ref >60)
Glucose, Bld: 95 mg/dL (ref 70–99)
Potassium: 3.4 mmol/L — ABNORMAL LOW (ref 3.5–5.1)
Sodium: 135 mmol/L (ref 135–145)

## 2023-10-27 LAB — HCG, SERUM, QUALITATIVE: Preg, Serum: NEGATIVE

## 2023-10-27 LAB — RESP PANEL BY RT-PCR (RSV, FLU A&B, COVID)  RVPGX2
Influenza A by PCR: NEGATIVE
Influenza B by PCR: NEGATIVE
Resp Syncytial Virus by PCR: NEGATIVE
SARS Coronavirus 2 by RT PCR: NEGATIVE

## 2023-10-27 MED ORDER — CLONAZEPAM 1 MG PO TABS: 1.0000 mg | ORAL_TABLET | Freq: Once | ORAL | Status: DC

## 2023-10-27 MED ORDER — MAGNESIUM SULFATE 2 GM/50ML IV SOLN
2.0000 g | Freq: Once | INTRAVENOUS | Status: AC
  Administered 2023-10-27: 2 g via INTRAVENOUS
  Filled 2023-10-27: qty 50

## 2023-10-27 MED ORDER — ONDANSETRON HCL 4 MG/2ML IJ SOLN
4.0000 mg | Freq: Once | INTRAMUSCULAR | Status: AC
Start: 2023-10-27 — End: 2023-10-27
  Administered 2023-10-27: 4 mg via INTRAVENOUS
  Filled 2023-10-27: qty 2

## 2023-10-27 MED ORDER — KETOROLAC TROMETHAMINE 30 MG/ML IJ SOLN
30.0000 mg | Freq: Once | INTRAMUSCULAR | Status: AC
  Administered 2023-10-27: 30 mg via INTRAVENOUS
  Filled 2023-10-27: qty 1

## 2023-10-27 MED ORDER — LORAZEPAM 2 MG/ML IJ SOLN
1.0000 mg | Freq: Once | INTRAMUSCULAR | Status: AC
  Administered 2023-10-27: 1 mg via INTRAVENOUS
  Filled 2023-10-27: qty 1

## 2023-10-27 MED ORDER — SODIUM CHLORIDE 0.9 % IV BOLUS
500.0000 mL | Freq: Once | INTRAVENOUS | Status: AC
  Administered 2023-10-27: 500 mL via INTRAVENOUS

## 2023-10-27 NOTE — ED Triage Notes (Addendum)
 Pt c/o HA since last Tues, worse over the last 2 days; sts it feels like a "spinal HA"; +NV; sts clear liquid is dripping from nose frequently x 2 days; denies head injury; had Zofran ODT at home

## 2023-10-27 NOTE — ED Notes (Signed)
 Pt here with HA started last Tuesday, worse over the last 2 days +N/V Tearful and feels scared Spouse at bedside

## 2023-10-27 NOTE — Discharge Instructions (Addendum)
You have been seen in the Emergency Department (ED) for a headache.  Please use Tylenol or Motrin as needed for symptoms, but only as written on the box.  °As we have discussed, please follow up with your primary care doctor as soon as possible regarding today?s Emergency Department (ED) visit and your headache symptoms.   ° °Call your doctor or return to the ED if you have a worsening headache, sudden and severe headache, confusion, slurred speech, facial droop, weakness or numbness in any arm or leg, extreme fatigue, vision problems, or other symptoms that concern you. ° °

## 2023-10-27 NOTE — ED Provider Notes (Signed)
 Emergency Department Provider Note   I have reviewed the triage vital signs and the nursing notes.   HISTORY  Chief Complaint Headache   HPI Brandy [name] is a 38 y.o. female presents to the emergency department for evaluation of headache.  She has had history of spinal headache following LP's in the past as well as intractable migraine headache.  She has followed with [facility] and neurology in the past.  Her last lumbar puncture was approximately 1 year prior with no instrumentation since that time.  She has developed a particularly bad headache over the past several days along with some watery rhinorrhea.  No fevers or chills.  No head trauma recently.  She has photophobia with nausea.  Headache is worse with sitting up and improved with lying flat.  Past Medical History:  Diagnosis Date   Abdominal pain, epigastric 02/18/2017   Allergy    Anterior neck pain 12/30/2015   Anxiety    Anxiety associated with birthing process 06/14/2018   B12 deficiency 07/21/2016   Back pain    Chest pain    Chronic mixed headache syndrome 06/14/2018   COVID-19 virus infection 03/2020   with infusion    Depression    DUB (dysfunctional uterine bleeding) 06/04/2011   Dysphagia 07/20/2018   Enlarged thyroid gland 12/30/2015   Fatigue 12/30/2015   Gallstones    Generalized anxiety disorder 06/09/2016   GERD (gastroesophageal reflux disease) 02/19/2011   Headache, unspecified headache type 03/05/2018   History of nephrolithiasis    Kidney stone    Medication overuse headache 06/14/2018   Menorrhagia 11/09/2018   Migraine    Missed abortion 11/09/2018   Nausea vomiting and diarrhea 12/28/2013   Nausea without vomiting 02/18/2017   NEPHROLITHIASIS, HX OF 12/16/2007   Qualifier: Diagnosis of  By: Tawanna Cooler MD, Eugenio Hoes    Other fatigue    Palpitations 03/21/2018   Pituitary tumor    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   Precordial pain 03/21/2018   Prediabetes    PVC's (premature ventricular  contractions)    Routine general medical examination at a health care facility 09/27/2014   Serum calcium elevated 12/28/2013   SOB (shortness of breath) on exertion    SVD (spontaneous vaginal delivery) 02/02/2018   Tendinitis of right ankle 08/14/2014   TMJ syndrome 05/30/2012   Vitamin D deficiency     Review of Systems  Constitutional: No fever/chills Cardiovascular: Denies chest pain. Respiratory: Denies shortness of breath. Gastrointestinal: No abdominal pain.   Skin: Negative for rash. Neurological: Positive HA.  ____________________________________________   PHYSICAL EXAM:  VITAL SIGNS: ED Triage Vitals  Encounter Vitals Group     BP 10/27/23 1822 (!) 130/95     Pulse Rate 10/27/23 1822 (!) 115     Resp 10/27/23 1822 (!) 22     Temp 10/27/23 1822 99.5 F (37.5 C)     Temp src --      SpO2 10/27/23 1822 100 %     Weight 10/27/23 1818 225 lb (102.1 kg)     Height 10/27/23 1818 5' 8.5" (1.74 m)   Constitutional: Alert but tearful on initial exam. Laying flat in bed.  Eyes: Conjunctivae are normal. PERRL. EOMI. Head: Atraumatic. Nose: No congestion/rhinnorhea. No appreciable rhinorrhea.  Mouth/Throat: Mucous membranes are moist.  Neck: No stridor.  No meningeal signs.  Cardiovascular: Normal rate, regular rhythm. Good peripheral circulation. Grossly normal heart sounds.   Respiratory: Normal respiratory effort.  No retractions. Lungs CTAB. Gastrointestinal: No distention.  Musculoskeletal:  No gross deformities of extremities. Neurologic:  Normal speech and language. No gross focal neurologic deficits are appreciated.  Skin:  Skin is warm, dry and intact. No rash noted.  ____________________________________________   LABS (all labs ordered are listed, but only abnormal results are displayed)  Labs Reviewed  BASIC METABOLIC PANEL - Abnormal; Notable for the following components:      Result Value   Potassium 3.4 (*)    CO2 20 (*)    All other components within  normal limits  RESP PANEL BY RT-PCR (RSV, FLU A&B, COVID)  RVPGX2  CBC WITH DIFFERENTIAL/PLATELET  HCG, SERUM, QUALITATIVE   ____________________________________________  RADIOLOGY  CT Head Wo Contrast Result Date: 10/27/2023 CLINICAL DATA:  Severe headache. EXAM: CT HEAD WITHOUT CONTRAST TECHNIQUE: Contiguous axial images were obtained from the base of the skull through the vertex without intravenous contrast. RADIATION DOSE REDUCTION: This exam was performed according to the departmental dose-optimization program which includes automated exposure control, adjustment of the mA and/or kV according to patient size and/or use of iterative reconstruction technique. COMPARISON:  Head CT 01/26/2023. FINDINGS: Brain: No evidence of acute infarction, hemorrhage, hydrocephalus, extra-axial collection or mass lesion/mass effect. Vascular: No hyperdense vessel or unexpected calcification. Skull: Normal. Negative for fracture or focal lesion. Sinuses/Orbits: No acute finding. Other: None. IMPRESSION: No acute intracranial abnormality. Electronically Signed   By: Darliss Cheney M.D.   On: 10/27/2023 19:49    ____________________________________________   PROCEDURES  Procedure(s) performed:   Procedures  None  ____________________________________________   INITIAL IMPRESSION / ASSESSMENT AND PLAN / ED COURSE  Pertinent labs & imaging results that were available during my care of the patient were reviewed by me and considered in my medical decision making (see chart for details).   This patient is Presenting for Evaluation of headache, which does require a range of treatment options, and is a complaint that involves a high risk of morbidity and mortality.  The Differential Diagnoses includes but is not exclusive to subarachnoid hemorrhage, meningitis, encephalitis, previous head trauma, cavernous venous thrombosis, muscle tension headache, glaucoma, temporal arteritis, migraine or migraine  equivalent, etc.   Critical Interventions-    Medications  ketorolac (TORADOL) 30 MG/ML injection 30 mg (30 mg Intravenous Given 10/27/23 2104)  magnesium sulfate IVPB 2 g 50 mL (0 g Intravenous Stopped 10/27/23 2119)  ondansetron (ZOFRAN) injection 4 mg (4 mg Intravenous Given 10/27/23 2100)  sodium chloride 0.9 % bolus 500 mL (0 mLs Intravenous Stopped 10/27/23 2141)  LORazepam (ATIVAN) injection 1 mg (1 mg Intravenous Given 10/27/23 2200)    Reassessment after intervention: pain improved.    I did obtain Additional Historical Information from husband at bedside.   I decided to review pertinent External Data, and in summary patient with migraine history and Neuro follow up in 2023 in the [facility] system.   Clinical Laboratory Tests Ordered, included CBC without leukocytosis. No AKI. Pregnancy negative. COVID/Flu negative.   Radiologic Tests Ordered, included CT head. I independently interpreted the images and agree with radiology interpretation.   Cardiac Monitor Tracing which shows NSR.    Social Determinants of Health Risk patient is a non-smoker.   Medical Decision Making: Summary:  Patient presents emergency department for evaluation of headache.  This has been a recurrent issue for her including this particular type of headache.  CT from the triage area is negative for skull base fracture.  History does not support a skull base injury which would cause leaking rhinorrhea.  No recent instrumentation  to the spine to strongly suspect a spinal leak.  Patient does describe bone spurs identified in spine imaging in the past.  No fever.  No meningitis concerns.  Plan for migraine cocktail and likely neurology referral as an outpatient if she improves.  Reevaluation with update and discussion with patient.  She is much more comfortable after migraine cocktail here.  She is sitting up and well-appearing.  I have placed a referral in our system for her to see [facility] neurology and  follow-up.  Patient's presentation is most consistent with acute presentation with potential threat to life or bodily function.   Disposition: discharge  ____________________________________________  FINAL CLINICAL IMPRESSION(S) / ED DIAGNOSES  Final diagnoses:  Bad headache    Note:  This document was prepared using Dragon voice recognition software and may include unintentional dictation errors.  Alona Bene, MD, [facility] Emergency Medicine    Valgene Deloatch, Arlyss Repress, MD 10/27/23 [phone]

## 2023-10-29 ENCOUNTER — Encounter: Payer: Self-pay | Admitting: Orthopaedic Surgery

## 2023-10-29 ENCOUNTER — Ambulatory Visit (INDEPENDENT_AMBULATORY_CARE_PROVIDER_SITE_OTHER): Payer: PRIVATE HEALTH INSURANCE | Admitting: Orthopaedic Surgery

## 2023-10-29 ENCOUNTER — Other Ambulatory Visit: Payer: Self-pay

## 2023-10-29 VITALS — BP 110/63 | HR 107 | Ht 69.0 in | Wt 220.0 lb

## 2023-10-29 DIAGNOSIS — M542 Cervicalgia: Secondary | ICD-10-CM | POA: Diagnosis not present

## 2023-10-29 MED ORDER — GABAPENTIN 100 MG PO CAPS: 100.0000 mg | ORAL_CAPSULE | Freq: Three times a day (TID) | ORAL | 2 refills | Status: AC

## 2023-10-29 NOTE — Progress Notes (Signed)
 Office Visit Note   Patient: Brandy [name]           Date of Birth: 16-Jan-1986           MRN: [id number] Visit Date: 10/29/2023              Requested by: Simone Curia, MD [id number] [place],  [place] [address] PCP: Simone Curia, MD   Assessment & Plan: Visit Diagnoses:  1. Neck pain     Plan: Home cervical traction.  Neurontin starting low-dose.  Will set up for some therapy at deep [facility] for cervical spondylosis and small disc bulge.  No evidence of active radiculopathy.  Follow-up 3 to 4 weeks she is having ongoing symptoms.  Follow-Up Instructions: No follow-ups on file.   Orders:  Orders Placed This Encounter  Procedures   XR Cervical Spine 2 or 3 views   Meds ordered this encounter  Medications   gabapentin (NEURONTIN) 100 MG capsule    Sig: Take 1 capsule (100 mg total) by mouth 3 (three) times daily.    Dispense:  60 capsule    Refill:  2      Procedures: No procedures performed   Clinical Data: No additional findings.   Subjective: Chief Complaint  Patient presents with   Neck - Pain    HPI 38 year old female with neck pain off and on the base of the neck x 1 year.  She is having posterior headaches which she states are severe.  Pain does not radiate down her arms no numbness or tingling in her fingers no gait disturbance.  She states she had an MRI several years ago which showed some bone spurs or disc bulging.  She has used ibuprofen Tylenol and Celebrex without relief.  MRI scan ordered by Dr. [place] December 2023 showed shallow disc bulge C4-5 C5-6 slight bulge at C6-7.  She did not have significant central or foraminal stenosis at any level.  Review of Systems all systems are noncontributory to HPI.   Objective: Vital Signs: BP 110/63   Pulse (!) 107   Ht 5\' 9"  (1.753 m)   Wt 220 lb (99.8 kg)   LMP 10/15/2023   BMI 32.49 kg/m   Physical Exam Constitutional:      Appearance: She is well-developed.  HENT:     Head:  Normocephalic.     Right Ear: External ear normal.     Left Ear: External ear normal. There is no impacted cerumen.  Eyes:     Pupils: Pupils are equal, round, and reactive to light.  Neck:     Thyroid: No thyromegaly.     Trachea: No tracheal deviation.  Cardiovascular:     Rate and Rhythm: Normal rate.  Pulmonary:     Effort: Pulmonary effort is normal.  Abdominal:     Palpations: Abdomen is soft.  Musculoskeletal:     Cervical back: No rigidity.  Skin:    General: Skin is warm and dry.  Neurological:     Mental Status: She is alert and oriented to person, place, and time.  Psychiatric:        Behavior: Behavior normal.     Ortho Exam patient has some relief with distraction increased pain with cervical compression some brachioplexus tenderness upper extremity reflexes are 2+ no isolated motor weakness biceps triceps deltoid wrist flexion extension interossei supination pronation.  Normal gait no lower extremity clonus.  Specialty Comments:  No specialty comments available.  Imaging: No results  found.   PMFS History: Patient Active Problem List   Diagnosis Date Noted   Postconcussion syndrome 09/16/2023   Pituitary adenoma (HCC) 09/30/2021   Other chest pain 06/23/2021   Other fatigue 06/23/2021   PVC (premature ventricular contraction) 10/04/2020   Obesity (BMI 30-39.9) 10/04/2020   Ventricular premature depolarization 10/04/2020   PVC's (premature ventricular contractions)    Allergy    History of pituitary adenoma 09/03/2020   Muscle twitch 09/03/2020   Anxiety    Gallstones    History of nephrolithiasis    Kidney stone    Pituitary tumor    Symptomatic PVCs 04/30/2020   Shortness of breath 04/30/2020   Pneumonia due to COVID-19 virus 04/30/2020   Obesity 04/30/2020   COVID-19 virus infection 03/2020   Menorrhagia 11/09/2018   Missed abortion 11/09/2018   Dysphagia 07/20/2018   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   Chronic mixed headache  syndrome 06/14/2018   Medication overuse headache 06/14/2018   Palpitations 03/21/2018   Precordial pain 03/21/2018   Headache, unspecified headache type 03/05/2018   SVD (spontaneous vaginal delivery) 02/02/2018   Nausea without vomiting 02/18/2017   Abdominal pain, epigastric 02/18/2017   Vitamin D deficiency 07/21/2016   B12 deficiency 07/21/2016   Generalized anxiety disorder 06/09/2016   Anterior neck pain 12/30/2015   Thyroid nodule 12/30/2015   Fatigue 12/30/2015   Enlarged thyroid gland 12/30/2015   Routine general medical examination at a health care facility 09/27/2014   Tendinitis of right ankle 08/14/2014   Nausea vomiting and diarrhea 12/28/2013   Serum calcium elevated 12/28/2013   TMJ syndrome 05/30/2012   DUB (dysfunctional uterine bleeding) 06/04/2011   GERD (gastroesophageal reflux disease) 02/19/2011   Depression 09/06/2008   History of urinary stone 12/16/2007   NEPHROLITHIASIS, HX OF 12/16/2007   Past Medical History:  Diagnosis Date   Abdominal pain, epigastric 02/18/2017   Allergy    Anterior neck pain 12/30/2015   Anxiety    Anxiety associated with birthing process 06/14/2018   B12 deficiency 07/21/2016   Back pain    Chest pain    Chronic mixed headache syndrome 06/14/2018   COVID-19 virus infection 03/2020   with infusion    Depression    DUB (dysfunctional uterine bleeding) 06/04/2011   Dysphagia 07/20/2018   Enlarged thyroid gland 12/30/2015   Fatigue 12/30/2015   Gallstones    Generalized anxiety disorder 06/09/2016   GERD (gastroesophageal reflux disease) 02/19/2011   Headache, unspecified headache type 03/05/2018   History of nephrolithiasis    Kidney stone    Medication overuse headache 06/14/2018   Menorrhagia 11/09/2018   Migraine    Missed abortion 11/09/2018   Nausea vomiting and diarrhea 12/28/2013   Nausea without vomiting 02/18/2017   NEPHROLITHIASIS, HX OF 12/16/2007   Qualifier: Diagnosis of  By: Tawanna Cooler MD, Eugenio Hoes    Other fatigue     Palpitations 03/21/2018   Pituitary tumor    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   Precordial pain 03/21/2018   Prediabetes    PVC's (premature ventricular contractions)    Routine general medical examination at a health care facility 09/27/2014   Serum calcium elevated 12/28/2013   SOB (shortness of breath) on exertion    SVD (spontaneous vaginal delivery) 02/02/2018   Tendinitis of right ankle 08/14/2014   TMJ syndrome 05/30/2012   Vitamin D deficiency     Family History  Problem Relation Age of Onset   Diabetes Mother    Mental illness Mother    Asthma Mother  Stomach cancer Mother    Colon polyps Mother    Heart disease Mother    Hypertension Mother    Arthritis Mother    Thyroid disease Mother    Cancer Mother    Anxiety disorder Mother    Depression Mother    Obesity Mother    Diabetes Father    Hypertension Father    Hyperlipidemia Father    Cancer Father    Sleep apnea Father    Anxiety disorder Sister    Colon cancer Maternal Grandmother    Diabetes Paternal Grandmother    Breast cancer Maternal Aunt    Lung cancer Maternal Aunt    Diabetes Paternal Aunt    Diabetes Maternal Uncle    Diabetes Maternal Uncle    Esophageal cancer Neg Hx    Rectal cancer Neg Hx     Past Surgical History:  Procedure Laterality Date   CHOLECYSTECTOMY  2014   KIDNEY STONE SURGERY     x 5   TYMPANOSTOMY TUBE PLACEMENT     WISDOM TOOTH EXTRACTION     Social History   Occupational History   Occupation: customer care representative  Tobacco Use   Smoking status: Never   Smokeless tobacco: Never  Vaping Use   Vaping status: Never Used  Substance and Sexual Activity   Alcohol use: Not Currently   Drug use: No   Sexual activity: Yes

## 2023-11-01 ENCOUNTER — Institutional Professional Consult (permissible substitution): Payer: Managed Care, Other (non HMO) | Admitting: Neurology

## 2023-11-01 ENCOUNTER — Telehealth: Payer: Self-pay | Admitting: Neurology

## 2023-11-01 NOTE — Telephone Encounter (Signed)
 R/S Provider called out

## 2023-11-01 NOTE — Telephone Encounter (Signed)
 Is it possible to get this patient in sooner than May for her appointment? Please see her response and advise.

## 2023-12-23 ENCOUNTER — Other Ambulatory Visit: Payer: Self-pay

## 2023-12-23 DIAGNOSIS — G4489 Other headache syndrome: Secondary | ICD-10-CM

## 2023-12-31 ENCOUNTER — Telehealth: Payer: Self-pay | Admitting: Physician Assistant

## 2023-12-31 ENCOUNTER — Other Ambulatory Visit: Payer: Self-pay

## 2023-12-31 MED ORDER — CLONAZEPAM 0.5 MG PO TABS: 0.2500 mg | ORAL_TABLET | Freq: Two times a day (BID) | ORAL | 0 refills | Status: DC | PRN

## 2023-12-31 NOTE — Telephone Encounter (Signed)
 Pended RF of clonazepam  to [facility]

## 2023-12-31 NOTE — Telephone Encounter (Signed)
 Pt called and made an appt for 02/23/24. She needs a refill on her klonopin . [facility]acy is [facility] on [address] in [place]

## 2024-01-04 ENCOUNTER — Ambulatory Visit
Admission: RE | Admit: 2024-01-04 | Discharge: 2024-01-04 | Disposition: A | Discharge status: Home / Self Care | Source: Ambulatory Visit

## 2024-01-04 ENCOUNTER — Institutional Professional Consult (permissible substitution): Admitting: Neurology

## 2024-01-04 DIAGNOSIS — G4489 Other headache syndrome: Secondary | ICD-10-CM

## 2024-01-04 MED ORDER — GADOPICLENOL 0.5 MMOL/ML IV SOLN
10.0000 mL | Freq: Once | INTRAVENOUS | Status: AC | PRN
  Administered 2024-01-04: 10 mL via INTRAVENOUS

## 2024-02-18 ENCOUNTER — Other Ambulatory Visit: Payer: Self-pay

## 2024-02-18 ENCOUNTER — Emergency Department (HOSPITAL_COMMUNITY)

## 2024-02-18 ENCOUNTER — Emergency Department (HOSPITAL_COMMUNITY)
Admission: EM | Admit: 2024-02-18 | Discharge: 2024-02-18 | Disposition: A | Discharge status: Home / Self Care | Attending: Emergency Medicine | Admitting: Emergency Medicine

## 2024-02-18 DIAGNOSIS — K5900 Constipation, unspecified: Secondary | ICD-10-CM | POA: Diagnosis not present

## 2024-02-18 DIAGNOSIS — R1031 Right lower quadrant pain: Secondary | ICD-10-CM | POA: Diagnosis present

## 2024-02-18 LAB — COMPREHENSIVE METABOLIC PANEL WITH GFR
ALT: 13 U/L (ref 0–44)
AST: 16 U/L (ref 15–41)
Albumin: 4.1 g/dL (ref 3.5–5.0)
Alkaline Phosphatase: 60 U/L (ref 38–126)
Anion gap: 11 (ref 5–15)
BUN: 11 mg/dL (ref 6–20)
CO2: 24 mmol/L (ref 22–32)
Calcium: 9.5 mg/dL (ref 8.9–10.3)
Chloride: 106 mmol/L (ref 98–111)
Creatinine, Ser: 1.06 mg/dL — ABNORMAL HIGH (ref 0.44–1.00)
GFR, Estimated: >60 mL/min (ref >60)
Glucose, Bld: 137 mg/dL — ABNORMAL HIGH (ref 70–99)
Potassium: 3.8 mmol/L (ref 3.5–5.1)
Sodium: 141 mmol/L (ref 135–145)
Total Bilirubin: 0.7 mg/dL (ref 0.0–1.2)
Total Protein: 7.6 g/dL (ref 6.5–8.1)

## 2024-02-18 LAB — URINALYSIS, ROUTINE W REFLEX MICROSCOPIC
Bacteria, UA: NONE SEEN
Bilirubin Urine: NEGATIVE
Glucose, UA: NEGATIVE mg/dL
Ketones, ur: NEGATIVE mg/dL
Leukocytes,Ua: NEGATIVE
Nitrite: POSITIVE — AB
Protein, ur: NEGATIVE mg/dL
Specific Gravity, Urine: 1.02 (ref 1.005–1.030)
pH: 5 (ref 5.0–8.0)

## 2024-02-18 LAB — PREGNANCY, URINE: Preg Test, Ur: NEGATIVE

## 2024-02-18 MED ORDER — KETOROLAC TROMETHAMINE 30 MG/ML IJ SOLN
15.0000 mg | Freq: Once | INTRAMUSCULAR | Status: AC
  Administered 2024-02-18: 15 mg via INTRAVENOUS
  Filled 2024-02-18: qty 1

## 2024-02-18 MED ORDER — SODIUM CHLORIDE 0.9 % IV SOLN
1.0000 g | Freq: Once | INTRAVENOUS | Status: AC
  Administered 2024-02-18: 1 g via INTRAVENOUS
  Filled 2024-02-18: qty 10

## 2024-02-18 MED ORDER — SULFAMETHOXAZOLE-TRIMETHOPRIM 800-160 MG PO TABS: 1.0000 | ORAL_TABLET | Freq: Two times a day (BID) | ORAL | 0 refills | Status: AC

## 2024-02-18 MED ORDER — POLYETHYLENE GLYCOL 3350 17 G PO PACK
17.0000 g | PACK | Freq: Every day | ORAL | Status: DC
Start: 2024-02-18 — End: 2024-02-19
  Administered 2024-02-18: 17 g via ORAL
  Filled 2024-02-18: qty 1

## 2024-02-18 MED ORDER — POLYETHYLENE GLYCOL 3350 17 G PO PACK
17.0000 g | PACK | Freq: Every day | ORAL | Status: DC
  Administered 2024-02-18: 17 g via ORAL
  Filled 2024-02-18: qty 1

## 2024-02-18 MED ORDER — ONDANSETRON HCL 4 MG/2ML IJ SOLN
4.0000 mg | Freq: Once | INTRAMUSCULAR | Status: AC
  Administered 2024-02-18: 4 mg via INTRAVENOUS
  Filled 2024-02-18: qty 2

## 2024-02-18 NOTE — ED Provider Triage Note (Signed)
 Emergency Medicine Provider Triage Evaluation Note  Olimpia Tinch , a 38 y.o. female  was evaluated in triage.  Pt complains of right-sided lower abdominal pain and back pain, flank pain that has been going on since Monday of this week.  Extensive history of nephrolithiasis and urolithiasis treated outpatient by [facility] in the past.  She states that the pain in the current episode is similar to previous pain that she has experienced.  Seen in urgent care yesterday and managed for UTI with nitrofurantoin , has associated nausea.  Review of Systems  Positive: Right lower abdominal pain and flank pain, nausea Negative: Vomiting  Physical Exam  BP (!) 143/93 (BP Location: Right Arm)   Pulse 94   Temp 98.3 F (36.8 C) (Oral)   Resp 16   SpO2 100%  Gen:   Awake, no distress but visibly uncomfortable Resp:  Normal effort  MSK:   Moves extremities without difficulty  Other:  Pain to deep palpation of the right lower quadrant, positive CVA tenderness.  Medical Decision Making  Medically screening exam initiated at 5:06 PM.  Appropriate orders placed.  Shatonya Passon was informed that the remainder of the evaluation will be completed by another provider, this initial triage assessment does not replace that evaluation, and the importance of remaining in the ED until their evaluation is complete.  Based on clinical exam of this patient, this is consistent with possible nephro/urolithiasis.  Further consider pyelonephritis or cystitis.  Order set placed as noted.   Juanetta Nordmann, [place] 02/18/24 [phone]

## 2024-02-18 NOTE — ED Provider Notes (Cosign Needed Addendum)
  EMERGENCY DEPARTMENT AT [facility] Provider Note   CSN: [id number] Arrival date & time: 02/18/24  1646     Patient presents with: Flank Pain   Brandy [name] is a 38 y.o. female who complains of right-sided lower abdominal pain along with back pain and flank pain that has been going on for approximately last 5 days.  It is noted this patient has an extensive history of nephrolithiasis that has been treated outpatient in the past.  In talking with the patient she also states that she has history of endometriosis and is actually been recommended to have a hysterectomy due to potential complications.  She denies any vaginal bleeding or discharge.  Believes pain to be due to renal stones as this feels similar to pain she has had in the past.  States that it feels more difficult to urinate over the past several days.  Has not had a bowel movement in last 3 days.    Flank Pain       Prior to Admission medications   Medication Sig Start Date End Date Taking? Authorizing Provider  buPROPion  (WELLBUTRIN  XL) 300 MG 24 hr tablet Take 1 tablet (300 mg total) by mouth daily. 09/14/23   Verneda Golder, PA-C  Cholecalciferol (VITAMIN D ) 2000 units CAPS Take 2,000 Units by mouth daily.    [provider]  clonazePAM  (KLONOPIN ) 0.5 MG tablet Take 0.5-1 tablets (0.25-0.5 mg total) by mouth 2 (two) times daily as needed for anxiety. 12/31/23   Marvia Slocumb T, PA-C  gabapentin  (NEURONTIN ) 100 MG capsule Take 1 capsule (100 mg total) by mouth 3 (three) times daily. 10/29/23   Adah Acron, MD  ibuprofen  (ADVIL ) 600 MG tablet Take 1 tablet (600 mg total) by mouth every 6 (six) hours as needed. 07/13/22   Haviland, Julie, MD  Melatonin 3-10 MG TABS Take by mouth.    [provider]  mirtazapine  (REMERON ) 7.5 MG tablet TAKE 1-2 TABLETS (7.5-15 MG TOTAL) BY MOUTH AT BEDTIME AS NEEDED. 11/01/22   Marvia Slocumb T, PA-C  ondansetron  (ZOFRAN -ODT) 4 MG disintegrating tablet Take  1 tablet (4 mg total) by mouth every 8 (eight) hours as needed for nausea or vomiting. 08/04/22   Jerilynn Montenegro, MD  Probiotic Product (PROBIOTIC PO) Take 1 capsule by mouth daily.    [provider]  promethazine  (PHENERGAN ) 25 MG tablet Take 1 tablet (25 mg total) by mouth every 6 (six) hours as needed for nausea or vomiting. 01/26/23   Fae Homans R, PA-C    Allergies: Metoclopramide , Penicillins, Azithromycin , Erenumab -aooe, Fluvoxamine, Kenalog  [triamcinolone ], Penicillin g benzathine, Prochlorperazine , Sumatriptan, and Zoloft  [sertraline  hcl]    Review of Systems  Gastrointestinal:  Positive for constipation.  Genitourinary:  Positive for dysuria and flank pain. Negative for pelvic pain and urgency.  All other systems reviewed and are negative.   Updated Vital Signs BP (!) 143/93 (BP Location: Right Arm)   Pulse 94   Temp 98.3 F (36.8 C) (Oral)   Resp 16   SpO2 100%   Physical Exam Vitals and nursing note reviewed.  Constitutional:      General: She is not in acute distress.    Appearance: Normal appearance.  HENT:     Head: Normocephalic and atraumatic.     Mouth/Throat:     Mouth: Mucous membranes are moist.     Pharynx: Oropharynx is clear.   Eyes:     Extraocular Movements: Extraocular movements intact.     Conjunctiva/sclera: Conjunctivae  normal.     Pupils: Pupils are equal, round, and reactive to light.    Cardiovascular:     Rate and Rhythm: Normal rate and regular rhythm.     Pulses: Normal pulses.     Heart sounds: Normal heart sounds. No murmur heard.    No friction rub. No gallop.  Pulmonary:     Effort: Pulmonary effort is normal.     Breath sounds: Normal breath sounds.  Abdominal:     General: Abdomen is flat. Bowel sounds are normal.     Palpations: Abdomen is soft.     Tenderness: There is abdominal tenderness in the right lower quadrant and left lower quadrant. There is right CVA tenderness. There is no left CVA tenderness.    Musculoskeletal:        General: Normal range of motion.     Cervical back: Normal range of motion and neck supple.     Right lower leg: No edema.     Left lower leg: No edema.   Skin:    General: Skin is warm and dry.     Capillary Refill: Capillary refill takes less than 2 seconds.   Neurological:     General: No focal deficit present.     Mental Status: She is alert. Mental status is at baseline.   Psychiatric:        Mood and Affect: Mood normal.     (all labs ordered are listed, but only abnormal results are displayed) Labs Reviewed  COMPREHENSIVE METABOLIC PANEL WITH GFR - Abnormal; Notable for the following components:      Result Value   Glucose, Bld 137 (*)    Creatinine, Ser 1.06 (*)    All other components within normal limits  URINALYSIS, ROUTINE W REFLEX MICROSCOPIC - Abnormal; Notable for the following components:   Color, Urine AMBER (*)    Hgb urine dipstick MODERATE (*)    Nitrite POSITIVE (*)    All other components within normal limits  PREGNANCY, URINE    EKG: None  Radiology: CT Renal Stone Study Result Date: 02/18/2024 CLINICAL DATA:  Hematuria and right flank pain EXAM: CT ABDOMEN AND PELVIS WITHOUT CONTRAST TECHNIQUE: Multidetector CT imaging of the abdomen and pelvis was performed following the standard protocol without IV contrast. RADIATION DOSE REDUCTION: This exam was performed according to the departmental dose-optimization program which includes automated exposure control, adjustment of the mA and/or kV according to patient size and/or use of iterative reconstruction technique. COMPARISON:  CT abdomen and pelvis dated 08/29/2023 FINDINGS: Lower chest: Unchanged subpleural left lower lobe 3 mm nodule (6:3). No pleural effusion or pneumothorax demonstrated. Partially imaged heart size is normal. Hepatobiliary: No focal hepatic lesions. No intra or extrahepatic biliary ductal dilation. Cholecystectomy. Pancreas: No focal lesions or main ductal  dilation. Spleen: Normal in size without focal abnormality. Adrenals/Urinary Tract: No adrenal nodules. No suspicious renal masses by noncontrast technique. Presumed left parapelvic cysts. No hydronephrosis. Bilateral punctate nonobstructing renal stones. No focal bladder wall thickening. Stomach/Bowel: Normal appearance of the stomach. No evidence of bowel wall thickening, distention, or inflammatory changes. Large volume stool in the cecum. Moderate volume stool within the ascending and transverse colon. Normal appendix. Vascular/Lymphatic: No significant vascular findings are present. No enlarged abdominal or pelvic lymph nodes. Reproductive: No adnexal masses. Other: No free fluid, fluid collection, or free air. Musculoskeletal: No acute or abnormal lytic or blastic osseous lesions. IMPRESSION: 1. Bilateral punctate nonobstructing renal stones. No hydronephrosis. 2. Findings suggestive of constipation. Electronically  Signed   By: Limin  Xu M.D.   On: 02/18/2024 19:58     Procedures   Medications Ordered in the ED  cefTRIAXone  (ROCEPHIN ) 1 g in sodium chloride  0.9 % 100 mL IVPB (has no administration in time range)  polyethylene glycol (MIRALAX / GLYCOLAX) packet 17 g (has no administration in time range)  ketorolac  (TORADOL ) 30 MG/ML injection 15 mg (15 mg Intravenous Given 02/18/24 1804)  ondansetron  (ZOFRAN ) injection 4 mg (4 mg Intravenous Given 02/18/24 1805)                                    Medical Decision Making Amount and/or Complexity of Data Reviewed Labs: ordered. Radiology: ordered.  Risk OTC drugs. Prescription drug management.   Medical Decision Making:   Radha Coggins is a 38 y.o. female who presented to the ED today with lower abdominal pain detailed above.     Complete initial physical exam performed, notably the patient  was alert and oriented in no apparent distress but visibly uncomfortable.  She had point tenderness to the right and left lower abdomen and mild  CVA tenderness to the right.  No scleral icterus, no jaundice..    Reviewed and confirmed nursing documentation for past medical history, family history, social history.    Initial Assessment:   With the patient's presentation of lower abdominal pain, most likely diagnosis is possible nephro/urolithiasis, complications with endometriosis, constipation.    Initial Plan:  CT imaging of the abdomen to rule out renal stones and other abdominal pathology Screening labs including CBC and Metabolic panel to evaluate for infectious or metabolic etiology of disease.  Urinalysis with reflex culture ordered to evaluate for UTI or relevant urologic/nephrologic pathology.  Objective evaluation as below reviewed   Initial Study Results:   Laboratory  All laboratory results reviewed without evidence of clinically relevant pathology.   Exceptions include: Creatinine is elevated 1.06 though GFR is still greater than 60.  Moderate hemoglobin in the urine along with positive nitrites.   Radiology:  All images reviewed independently. Agree with radiology report at this time.   CT Renal Stone Study Result Date: 02/18/2024 CLINICAL DATA:  Hematuria and right flank pain EXAM: CT ABDOMEN AND PELVIS WITHOUT CONTRAST TECHNIQUE: Multidetector CT imaging of the abdomen and pelvis was performed following the standard protocol without IV contrast. RADIATION DOSE REDUCTION: This exam was performed according to the departmental dose-optimization program which includes automated exposure control, adjustment of the mA and/or kV according to patient size and/or use of iterative reconstruction technique. COMPARISON:  CT abdomen and pelvis dated 08/29/2023 FINDINGS: Lower chest: Unchanged subpleural left lower lobe 3 mm nodule (6:3). No pleural effusion or pneumothorax demonstrated. Partially imaged heart size is normal. Hepatobiliary: No focal hepatic lesions. No intra or extrahepatic biliary ductal dilation. Cholecystectomy.  Pancreas: No focal lesions or main ductal dilation. Spleen: Normal in size without focal abnormality. Adrenals/Urinary Tract: No adrenal nodules. No suspicious renal masses by noncontrast technique. Presumed left parapelvic cysts. No hydronephrosis. Bilateral punctate nonobstructing renal stones. No focal bladder wall thickening. Stomach/Bowel: Normal appearance of the stomach. No evidence of bowel wall thickening, distention, or inflammatory changes. Large volume stool in the cecum. Moderate volume stool within the ascending and transverse colon. Normal appendix. Vascular/Lymphatic: No significant vascular findings are present. No enlarged abdominal or pelvic lymph nodes. Reproductive: No adnexal masses. Other: No free fluid, fluid collection, or free air. Musculoskeletal: No acute  or abnormal lytic or blastic osseous lesions. IMPRESSION: 1. Bilateral punctate nonobstructing renal stones. No hydronephrosis. 2. Findings suggestive of constipation. Electronically Signed   By: Limin  Xu M.D.   On: 02/18/2024 19:58     Reassessment and Plan:   Based on evaluation of this patient along with extensive workup, findings consistent with constipation possibly secondary to endometriosis.  Will manage initial constipation with MiraLAX with this to continue at home to facilitate passage of stool.  Encouraged follow-up with gynecology for continued management of her pre-existing endometriosis.  Based on findings of nitrites in the urine, will administer 1 dose of IM Rocephin  along with outpatient management with Bactrim  as noted.  There is an incidental finding of a lung nodule on the CT scan.  Patient is aware of this, states it has been followed by pulmonology, appreciate from read that the lung nodule is unchanged from previous results.       Final diagnoses:  None    ED Discharge Orders     None          Juanetta Nordmann, [place] 02/18/24 2133    Juanetta Nordmann, [place] 02/18/24 2139

## 2024-02-18 NOTE — ED Triage Notes (Signed)
 Pt came in for possible kidney stones. Pt has blood in her urine and c/o right flank pain. Pt was dx with a UTI yesterday and was given antibiotics. Pt has taken ibuprofen  with no relief.   Hx kidney stones

## 2024-02-23 ENCOUNTER — Encounter: Payer: Self-pay | Admitting: Physician Assistant

## 2024-02-23 ENCOUNTER — Telehealth (INDEPENDENT_AMBULATORY_CARE_PROVIDER_SITE_OTHER): Admitting: Physician Assistant

## 2024-02-23 DIAGNOSIS — F4323 Adjustment disorder with mixed anxiety and depressed mood: Secondary | ICD-10-CM

## 2024-02-23 DIAGNOSIS — F410 Panic disorder [episodic paroxysmal anxiety] without agoraphobia: Secondary | ICD-10-CM

## 2024-02-23 DIAGNOSIS — F5105 Insomnia due to other mental disorder: Secondary | ICD-10-CM | POA: Diagnosis not present

## 2024-02-23 DIAGNOSIS — F99 Mental disorder, not otherwise specified: Secondary | ICD-10-CM

## 2024-02-23 DIAGNOSIS — F4320 Adjustment disorder, unspecified: Secondary | ICD-10-CM | POA: Diagnosis not present

## 2024-02-23 MED ORDER — BUPROPION HCL ER (XL) 300 MG PO TB24: 300.0000 mg | ORAL_TABLET | Freq: Every day | ORAL | 1 refills | Status: DC

## 2024-02-23 MED ORDER — CLONAZEPAM 1 MG PO TABS: 1.0000 mg | ORAL_TABLET | Freq: Two times a day (BID) | ORAL | 5 refills | Status: DC | PRN

## 2024-02-23 NOTE — Progress Notes (Signed)
 [facility] Med Check  Patient ID: Brandy [name],  MRN: [id number]  PCP: Jama Chow, MD  Date of Evaluation: 02/23/2024 Time spent:20 minutes  Chief Complaint:  Chief Complaint   Anxiety; Depression; Follow-up    Virtual Visit via Telehealth  I connected with patient by a video enabled telemedicine application  with their informed consent, and verified patient privacy and that I am speaking with the correct person using two identifiers.  I am private, in my office and the patient is at home.  I discussed the limitations, risks, security and privacy concerns of performing an evaluation and management service by video and the availability of in person appointments. I also discussed with the patient that there may be a patient responsible charge related to this service. The patient expressed understanding and agreed to proceed.   I discussed the assessment and treatment plan with the patient. The patient was provided an opportunity to ask questions and all were answered. The patient agreed with the plan and demonstrated an understanding of the instructions.   The patient was advised to call back or seek an in-person evaluation if the symptoms worsen or if the condition fails to improve as anticipated.  I provided 20 minutes of non-face-to-face time during this encounter.  HISTORY/CURRENT STATUS: For routine med check.   Anxiety is still an issue. Klonopin  doesn't work as well as it used to.  Is often overwhelmed. Gets panicky sometimes but no full blown PA.  Her Mom isn't doing well, she's been sick with heart issues for a log time.  She's on O2 and it's hard for [employer] to see her like this.    Energy and motivation are fair to good depending on the day.  No extreme sadness, tearfulness, or feelings of hopelessness.  Sleeps well most of the time. ADLs and personal hygiene are normal.   Denies any changes in concentration or memory. No mania, psychosis, or delirium.  Denies suicidal or  homicidal thoughts.  Denies dizziness, syncope, seizures, numbness, tingling, tremor, tics, unsteady gait, slurred speech, confusion. Denies muscle or joint pain, stiffness, or dystonia.  Individual Medical History/ Review of Systems: Changes? :No       Past medications for mental health diagnoses include: Prozac , Luvox made her 'pass out,' Elavil, Ativan , Xanax , Zoloft  'revved her up,' Seroquel, Lithium for 1 pill, Buspar, Inderal ,  NAC, Turmeric, Trazodone  caused insomnia  Allergies: Metoclopramide , Penicillins, Azithromycin , Erenumab -aooe, Fluvoxamine, Kenalog  [triamcinolone ], Penicillin g benzathine, Prochlorperazine , Sumatriptan, and Zoloft  [sertraline  hcl]  Current Medications:  Current Outpatient Medications:    Cholecalciferol (VITAMIN D ) 2000 units CAPS, Take 2,000 Units by mouth daily., Disp: , Rfl:    clonazePAM  (KLONOPIN ) 1 MG tablet, Take 1 tablet (1 mg total) by mouth 2 (two) times daily as needed for anxiety., Disp: 60 tablet, Rfl: 5   gabapentin  (NEURONTIN ) 100 MG capsule, Take 1 capsule (100 mg total) by mouth 3 (three) times daily., Disp: 60 capsule, Rfl: 2   ibuprofen  (ADVIL ) 600 MG tablet, Take 1 tablet (600 mg total) by mouth every 6 (six) hours as needed., Disp: 30 tablet, Rfl: 0   Melatonin 3-10 MG TABS, Take by mouth., Disp: , Rfl:    mirtazapine  (REMERON ) 7.5 MG tablet, TAKE 1-2 TABLETS (7.5-15 MG TOTAL) BY MOUTH AT BEDTIME AS NEEDED., Disp: 180 tablet, Rfl: 1   ondansetron  (ZOFRAN -ODT) 4 MG disintegrating tablet, Take 1 tablet (4 mg total) by mouth every 8 (eight) hours as needed for nausea or vomiting., Disp: 10 tablet, Rfl: 0   Probiotic  Product (PROBIOTIC PO), Take 1 capsule by mouth daily., Disp: , Rfl:    promethazine  (PHENERGAN ) 25 MG tablet, Take 1 tablet (25 mg total) by mouth every 6 (six) hours as needed for nausea or vomiting., Disp: 15 tablet, Rfl: 0   buPROPion  (WELLBUTRIN  XL) 300 MG 24 hr tablet, Take 1 tablet (300 mg total) by mouth daily., Disp: 90  tablet, Rfl: 1 Medication Side Effects: weight gain from mirtazapine .  Family Medical/ Social History: Changes?  Her Mom is still very sick. That's been very stressful.   MENTAL HEALTH EXAM:  There were no vitals taken for this visit.There is no height or weight on file to calculate BMI.  General Appearance: Casual and Well Groomed  Eye Contact:  Good  Speech:  Clear and Coherent and Normal Rate  Volume:  Normal  Mood:  sad  Affect:  Congruent  Thought Process:  Goal Directed and Descriptions of Associations: Tangential  Orientation:  Full (Time, Place, and Person)  Thought Content: Logical   Suicidal Thoughts:  No  Homicidal Thoughts:  No  Memory:  WNL  Judgement:  Good  Insight:  Good  Psychomotor Activity:  Normal  Concentration:  Concentration: Good and Attention Span: Good  Recall:  Good  Fund of Knowledge: Good  Language: Good  Assets:  Desire for Improvement Financial Resources/Insurance Housing Transportation  ADL's:  Intact  Cognition: WNL  Prognosis:  Good   DIAGNOSES:    ICD-10-CM   1. Anticipatory grief  F43.20     2. Situational mixed anxiety and depressive disorder  F43.23     3. Insomnia due to other mental disorder  F51.05    F99     4. Panic disorder  F41.0      Receiving Psychotherapy: No   Marval Bunde, LCSW in the past.  RECOMMENDATIONS:  PDMP was reviewed.  Last Klonopin  filled 12/31/2023. I provided approx 20 minutes of non-face to face time during this encounter, including time spent before and after the visit in records review, medical decision making, counseling pertinent to today's visit, and charting.   Discussed the anxiety.  She's been on the same dose of Klonooin for years. Increasing the dose is appropriate. She would like to try it. No other changes will be made.   Continue Wellbutrin  XL 300 mg, 1 p.o. every morning. Increase Klonopin  to 1 bid prn.  Continue Mirtazapine  7.5 mg, 2 po q hs.prn.  Strongly recommend she get back in  therapy.  Return in 6 months.   Verneita Cooks, PA-C

## 2024-03-06 ENCOUNTER — Inpatient Hospital Stay: Payer: Self-pay | Admitting: Family Medicine

## 2024-03-08 ENCOUNTER — Ambulatory Visit (HOSPITAL_BASED_OUTPATIENT_CLINIC_OR_DEPARTMENT_OTHER): Payer: Self-pay | Admitting: Family Medicine

## 2024-03-13 ENCOUNTER — Ambulatory Visit (HOSPITAL_BASED_OUTPATIENT_CLINIC_OR_DEPARTMENT_OTHER): Payer: Self-pay | Admitting: Family Medicine

## 2024-03-15 ENCOUNTER — Ambulatory Visit (HOSPITAL_BASED_OUTPATIENT_CLINIC_OR_DEPARTMENT_OTHER): Payer: Self-pay | Admitting: Family Medicine

## 2024-05-22 ENCOUNTER — Ambulatory Visit: Payer: Self-pay

## 2024-05-22 NOTE — Telephone Encounter (Signed)
 Has had similar symptoms in the past intermittently. Agree with recommendations. PCP Dr Jama. Eben Choinski [place], MD

## 2024-05-22 NOTE — Telephone Encounter (Signed)
 FYI Only or Action Required?: Action required by provider: request for appointment and TOC to [facility].  Patient was last seen in primary care on 12/11/2022 by [place], Brandy G, MD.  Called Nurse Triage reporting Back Pain and Fatigue.  Symptoms began several months ago.  Interventions attempted: OTC medications: ibuprofen .  Symptoms are: gradually worsening.  Triage Disposition: See PCP Within 2 Weeks  Patient/caregiver understands and will follow disposition?: Unsure       Copied from CRM [id number]. Topic: Clinical - Red Word Triage >> May 22, 2024  1:57 PM Jasmin [name] wrote: Red Word that prompted transfer to Nurse Triage: A lot of lower back pain and fatigue for the past week. Reason for Disposition  Back pain present > 2 weeks  Answer Assessment - Initial Assessment Questions 1. ONSET: When did the pain begin? (e.[name]., minutes, hours, days)     A few months 2. LOCATION: Where does it hurt? (upper, mid or lower back)     Lower back above rectum - a pressure - endorses worse around menstrual cycle 3. SEVERITY: How bad is the pain?  (e.[name]., Scale 1-10; mild, moderate, or severe)     Very uncomfortable 4. PATTERN: Is the pain constant? (e.[name]., yes, no; constant, intermittent)      constant 5. RADIATION: Does the pain shoot into your legs or somewhere else?     Occasionally shoots down leg 6. CAUSE:  What do you think is causing the back pain?      unknown 7. BACK OVERUSE:  Any recent lifting of heavy objects, strenuous work or exercise?     Denies Endorses fall last January and received steroid INJ 8. MEDICINES: What have you taken so far for the pain? (e.[name]., nothing, acetaminophen , NSAIDS)     Ibuprofen  with no relief 9. NEUROLOGIC SYMPTOMS: Do you have any weakness, numbness, or problems with bowel/bladder control?     denies 10. OTHER SYMPTOMS: Do you have any other symptoms? (e.[name]., fever, abdomen pain, burning with urination, blood in urine)        denies 11. PREGNANCY: Is there any chance you are pregnant? When was your last menstrual period?       N/a    [facility]ager unable to find access/appropriate appointment via the [facility]. Additionally, pt looking to [facility] to [facility] from [facility] since she has moved to [place]. Forwarding to clinic to further assist.  Patient verbalized understanding and is expecting call back from office for appointment avilabilities. [facility]ager also advised that if pt does not hear back from office, to follow disposition for further evaluation/treatment.  Protocols used: Back Pain-A-AH

## 2024-07-03 ENCOUNTER — Encounter: Payer: Self-pay | Admitting: Radiology

## 2024-10-05 ENCOUNTER — Other Ambulatory Visit: Payer: Self-pay | Admitting: Physician Assistant

## 2024-10-05 ENCOUNTER — Telehealth: Payer: Self-pay | Admitting: Physician Assistant

## 2024-10-05 ENCOUNTER — Other Ambulatory Visit: Payer: Self-pay

## 2024-10-05 DIAGNOSIS — F411 Generalized anxiety disorder: Secondary | ICD-10-CM

## 2024-10-05 MED ORDER — CLONAZEPAM 1 MG PO TABS: 1.0000 mg | ORAL_TABLET | Freq: Two times a day (BID) | ORAL | 0 refills | Status: AC | PRN

## 2024-10-05 MED ORDER — BUPROPION HCL ER (XL) 300 MG PO TB24: 300.0000 mg | ORAL_TABLET | Freq: Every day | ORAL | 1 refills | Status: AC

## 2024-10-05 NOTE — Telephone Encounter (Signed)
 Pt called at 1:18p requesting refill of Wellbutrin  and Clonazepam  to    [id number] - [place], [place] - [address], [place] [address] Phone: [phone]  Fax: [phone]   Next appt 2/17

## 2024-10-05 NOTE — Telephone Encounter (Signed)
 Sent Wellbutrin , pended clonazepam .

## 2024-10-17 ENCOUNTER — Telehealth: Admitting: Physician Assistant
